# Patient Record
Sex: Male | Born: 1965 | Race: White | Hispanic: No | Smoking: Former smoker
Health system: Southern US, Community
[De-identification: ages and names within clinical notes are randomized; demographics above are authoritative.]

## PROBLEM LIST (undated history)

## (undated) DIAGNOSIS — M1990 Unspecified osteoarthritis, unspecified site: Secondary | ICD-10-CM

## (undated) DIAGNOSIS — C801 Malignant (primary) neoplasm, unspecified: Secondary | ICD-10-CM

## (undated) DIAGNOSIS — D649 Anemia, unspecified: Secondary | ICD-10-CM

## (undated) DIAGNOSIS — E7800 Pure hypercholesterolemia, unspecified: Secondary | ICD-10-CM

## (undated) DIAGNOSIS — C7951 Secondary malignant neoplasm of bone: Secondary | ICD-10-CM

## (undated) DIAGNOSIS — K589 Irritable bowel syndrome without diarrhea: Secondary | ICD-10-CM

## (undated) DIAGNOSIS — R011 Cardiac murmur, unspecified: Secondary | ICD-10-CM

## (undated) DIAGNOSIS — E119 Type 2 diabetes mellitus without complications: Secondary | ICD-10-CM

## (undated) DIAGNOSIS — D689 Coagulation defect, unspecified: Secondary | ICD-10-CM

## (undated) DIAGNOSIS — K219 Gastro-esophageal reflux disease without esophagitis: Secondary | ICD-10-CM

## (undated) HISTORY — PX: FRACTURE SURGERY: SHX138

## (undated) HISTORY — PX: COLONOSCOPY: SHX174

## (undated) HISTORY — DX: Gastro-esophageal reflux disease without esophagitis: K21.9

## (undated) HISTORY — PX: CYSTECTOMY: SUR359

## (undated) HISTORY — DX: Pure hypercholesterolemia, unspecified: E78.00

## (undated) HISTORY — PX: PILONIDAL CYST EXCISION: SHX744

## (undated) HISTORY — DX: Type 2 diabetes mellitus without complications: E11.9

## (undated) HISTORY — DX: Anemia, unspecified: D64.9

## (undated) HISTORY — DX: Cardiac murmur, unspecified: R01.1

## (undated) HISTORY — DX: Coagulation defect, unspecified: D68.9

## (undated) HISTORY — DX: Unspecified osteoarthritis, unspecified site: M19.90

## (undated) NOTE — *Deleted (*Deleted)
Radiation Oncology         [phone] ________________________________  Name: Mike [name] MRN: [id number]  Date: 08/18/2020  DOB: Jan 02, 1966  Follow-Up Visit Note  CC: Eustaquio Boyden, MD  Si Gaul, MD  No diagnosis found.  Diagnosis: Stage IV (T2b, N3, M1C) non-small cell lung cancer, squamous cell carcinoma presented with right upper lobe lung mass in addition to mediastinal and right supraclavicular lymphadenopathy as well as multiple metastatic bone lesions  Interval Since Last Radiation: One month and three days  Radiation Treatment Dates: 01/01/2020 through 01/14/2020 and 07/07/2020 through 07/16/2020 Site Technique Total Dose (Gy) Dose per Fx (Gy) Completed Fx Beam Energies  Hip, Right: Pelvis_Rt 3D 30/30 3 10/10 10X, 15X  Ribs, Right: Chest_Rt 3D 30/30 3 10/10 10X  Chest: Chest 3D 30/30 3 10/10 6X  Lung, Right: Lung_Rt 3D 20/20 2.5 8/8 10X, 15X  Ribs, Right: Chest_Rt 3D 20/20 2.5 8/8 15X    Narrative:  The patient returns today for routine follow-up and to discuss additional treatment. He was last seen by Dr. Roda Shutters on 08/13/2020, during which time he was noted to be progressively better and continued to take Oxycodone and Morphine as needed.  On review of systems, he reports ***. He denies ***.                  ALLERGIES:  has No Known Allergies.  Meds: Current Outpatient Medications  Medication Sig Dispense Refill  . acetaminophen (TYLENOL) 325 MG tablet Take 1-2 tablets (325-650 mg total) by mouth every 6 (six) hours as needed for mild pain (pain score 1-3 or temp > 100.5).    [name] albuterol (PROVENTIL) (2.5 MG/3ML) 0.083% nebulizer solution Take 3 mLs (2.5 mg total) by nebulization every 6 (six) hours as needed for wheezing or shortness of breath. 75 mL 12  . apixaban (ELIQUIS) 5 MG TABS tablet Take 1 tablet (5 mg total) by mouth 2 (two) times daily. 60 tablet 3  . atorvastatin (LIPITOR) 20 MG tablet Take 1 tablet (20 mg total) by mouth at bedtime. 90 tablet  3  . diazepam (VALIUM) 5 MG tablet Take 1 tablet (5 mg total) by mouth every 12 (twelve) hours as needed for anxiety or muscle spasms. 30 tablet 0  . docusate sodium (COLACE) 100 MG capsule Take 1 capsule (100 mg total) by mouth 2 (two) times daily. 10 capsule 0  . metFORMIN (GLUCOPHAGE) 1000 MG tablet Take 1 tablet (1,000 mg total) by mouth 2 (two) times daily with a meal. 180 tablet 3  . methocarbamol (ROBAXIN) 500 MG tablet Take 1 tablet (500 mg total) by mouth every 6 (six) hours as needed for muscle spasms. 60 tablet 6  . morphine (MS CONTIN) 60 MG 12 hr tablet Take 1 tablet (60 mg total) by mouth every 12 (twelve) hours. 30 tablet 0  . Oxycodone HCl 10 MG TABS Take 1 tablet (10 mg total) by mouth every 3 (three) hours as needed (for severe breakthrough pain). 60 tablet 0  . oxyCODONE-acetaminophen (PERCOCET) 5-325 MG tablet Take 1-2 tablets by mouth 3 (three) times daily as needed. 30 tablet 0  . sildenafil (REVATIO) 20 MG tablet Take 1-5 tablets (20-100 mg total) by mouth daily as needed (ED). (Patient taking differently: Take 40 mg by mouth daily as needed (ED). ) 30 tablet 3   No current facility-administered medications for this encounter.    Physical Findings: The patient is in no acute distress. Patient is alert and oriented.  vitals were not  taken for this visit.  . Lungs are clear to auscultation bilaterally. Heart has regular rate and rhythm. No palpable cervical, supraclavicular, or axillary adenopathy. Abdomen soft, non-tender, normal bowel sounds. ***  Lab Findings: Lab Results  Component Value Date   WBC 8.9 07/27/2020   HGB 12.3 (L) 07/27/2020   HCT 37.5 (L) 07/27/2020   MCV 92.6 07/27/2020   PLT 248 07/27/2020    Radiographic Findings: MR Pelvis W Wo Contrast  Result Date: 07/20/2020 CLINICAL DATA:  Posterior left hip pain in a patient with a history of metastatic lung cancer. EXAM: MRI PELVIS WITHOUT AND WITH CONTRAST TECHNIQUE: Multiplanar multisequence MR  imaging of the pelvis was performed both before and after administration of intravenous contrast. CONTRAST:  10 mL GADAVIST IV SOLN COMPARISON:  CT chest, abdomen and pelvis 06/06/2020. FINDINGS: Bones/Joint/Cartilage A metastatic deposit in the base of the left femoral neck and left intertrochanteric femur measures approximately 4.5 cm AP by 5 cm transverse by 6 cm craniocaudal. The deposit fills the base of the femoral neck. There appears to be a small focus of cortical destruction along the anterior, superior margin of the femoral neck. A second metastatic deposit in the right side of the S1 vertebral body is approximately 3.1 cm transverse by 2 cm craniocaudal by 2 cm AP. There is also a metastatic lesion in the posterior left ilium measuring 4 cm AP by 2 cm transverse by 3.8 cm craniocaudal. Previously seen metastatic lesion in the posterior right acetabulum is now cystic with a corticated margin consistent with prior treatment. There is enhancing soft tissue about the periphery of the lesion worrisome for residual tumor. Finally, a punctate metastatic deposit is seen in the left acetabulum image 22 of series 12. Ligaments Intact. Muscles and Tendons Intact. Edema in the left hip adductors is likely due to strain from altered mechanics. No intramuscular mass or fluid collection. Soft tissues No acute or focal abnormality. IMPRESSION: Osseous metastatic disease as described above. Largest deposit is in the base of the left femoral neck and intertrochanteric femur. There appears to be a small cortical break in the superior margin of the femoral neck and the patient may be at risk for pathologic fracture. These results will be called to the ordering clinician or representative by the Radiologist Assistant, and communication documented in the PACS or [employer]. Electronically Signed   By: Drusilla Kanner M.D.   On: 07/20/2020 12:18   DG C-Arm 1-60 Min  Result Date: 07/25/2020 CLINICAL DATA:  Known  proximal left femoral metastatic disease EXAM: OPERATIVE LEFT HIP WITH PELVIS; DG C-ARM 1-60 MIN COMPARISON:  MRI from 07/19/2020 FLUOROSCOPY TIME:  Fluoroscopy Time:  1 minutes 20 seconds Radiation Exposure Index (if provided by the fluoroscopic device): 30.15 mGy Number of Acquired Spot Images: 4 FINDINGS: Medullary rod is seen in the left femur with proximal and distal fixation screws. The known proximal femoral metastatic lesion is not well appreciated on the spot films. IMPRESSION: Fixation of proximal left femoral metastatic disease. Electronically Signed   By: Alcide Clever M.D.   On: 07/25/2020 18:47   DG HIP OPERATIVE UNILAT W OR W/O PELVIS LEFT  Result Date: 07/25/2020 CLINICAL DATA:  Known proximal left femoral metastatic disease EXAM: OPERATIVE LEFT HIP WITH PELVIS; DG C-ARM 1-60 MIN COMPARISON:  MRI from 07/19/2020 FLUOROSCOPY TIME:  Fluoroscopy Time:  1 minutes 20 seconds Radiation Exposure Index (if provided by the fluoroscopic device): 30.15 mGy Number of Acquired Spot Images: 4 FINDINGS: Medullary rod is seen  in the left femur with proximal and distal fixation screws. The known proximal femoral metastatic lesion is not well appreciated on the spot films. IMPRESSION: Fixation of proximal left femoral metastatic disease. Electronically Signed   By: Alcide Clever M.D.   On: 07/25/2020 18:47   XR HIP UNILAT W OR W/O PELVIS 2-3 VIEWS LEFT  Result Date: 08/13/2020 No hardware complications.     Impression: Stage IV (T2b, N3, M1C) non-small cell lung cancer, squamous cell carcinoma presented with right upper lobe lung mass in addition to mediastinal and right supraclavicular lymphadenopathy as well as multiple metastatic bone lesions  ***  Plan: The patient will return on *** for CT simulation. Anticipate 10 treatments to the left femur region and 10 treatments to the disease in the left pelvis region.  -----------------------------------   Billie Lade, PhD, MD  This document  serves as a record of services personally performed by Antony Blackbird, MD. It was created on his behalf by Nikki Dom, a trained medical scribe. The creation of this record is based on the scribe's personal observations and the provider's statements to them. This document has been checked and approved by the attending provider.

---

## 2014-04-17 DIAGNOSIS — E1169 Type 2 diabetes mellitus with other specified complication: Secondary | ICD-10-CM | POA: Insufficient documentation

## 2014-07-24 ENCOUNTER — Ambulatory Visit (HOSPITAL_BASED_OUTPATIENT_CLINIC_OR_DEPARTMENT_OTHER): Payer: BC Managed Care – PPO | Attending: Internal Medicine | Admitting: Radiology

## 2014-07-24 VITALS — Ht 73.0 in | Wt 275.0 lb

## 2014-07-24 DIAGNOSIS — G4730 Sleep apnea, unspecified: Secondary | ICD-10-CM | POA: Diagnosis present

## 2014-07-24 DIAGNOSIS — R0683 Snoring: Secondary | ICD-10-CM | POA: Diagnosis not present

## 2014-07-24 DIAGNOSIS — G4733 Obstructive sleep apnea (adult) (pediatric): Secondary | ICD-10-CM | POA: Diagnosis not present

## 2014-08-03 DIAGNOSIS — G4733 Obstructive sleep apnea (adult) (pediatric): Secondary | ICD-10-CM

## 2014-08-03 NOTE — Sleep Study (Signed)
   NAME: Mike [name] DATE OF BIRTH:  03/05/1966 MEDICAL RECORD NUMBER [id number]  LOCATION: [facility]  PHYSICIAN: Jalin Alicea D  DATE OF STUDY: 07/24/2014  SLEEP STUDY TYPE: Nocturnal Polysomnogram               REFERRING PHYSICIAN: Leonard Downing, *  INDICATION FOR STUDY: Hypersomnia with sleep apnea  EPWORTH SLEEPINESS SCORE:   11/24 HEIGHT: 6\' 1"  (185.4 cm)  WEIGHT: 275 lb (124.739 kg)    Body mass index is 36.29 kg/(m^2).  NECK SIZE: 18 in.  MEDICATIONS: Chart for review  SLEEP ARCHITECTURE: Total sleep time 398 minutes with sleep efficiency 92%. Stage I was 13.7%, stage II 72.1%, stage III absent, REM 14.2% of total sleep time. Sleep latency 5.5 minutes, REM latency 130 minutes, awake after sleep onset 29 minutes, arousal index 58.2. Bedtime medication: None  RESPIRATORY DATA: Apnea hypopneas index (AHI) 118.6 per hour. 784 total events scored including 264 obstructive apneas, 2 central apneas, 16 mixed apneas, 500 to hypopneas. Events were not positional. REM AHI 129.6 per hour. This study was ordered as a diagnostic polysomnogram (NPSG), so CPAP titration was not done.  OXYGEN DATA: Moderate to loud snoring with oxygen desaturation to a nadir of 72% and mean saturation 90.8% on room air.  CARDIAC DATA: Sinus rhythm with PVCs  MOVEMENT/PARASOMNIA: No significant motor disturbance, bathroom x2  IMPRESSION/ RECOMMENDATION:   1) Very severe obstructive sleep apnea/hypopneas syndrome, AHI 118.6 per hour. Non-positional events. REM AHI 129.6 per hour. Moderate to loud snoring with oxygen desaturation to a nadir of 72% and mean saturation 90.8% on room air. 2) This study was ordered as a diagnostic polysomnogram (NPSG) without CPAP titration option. This patient can return for a dedicated CPAP titration study.   Deneise Lever Diplomate, American Board of Sleep Medicine  ELECTRONICALLY SIGNED ON:  08/03/2014, 9:32 AM [place] PH: [phone]   FX: [phone] [place]

## 2014-10-01 ENCOUNTER — Ambulatory Visit: Payer: BC Managed Care – PPO | Admitting: Internal Medicine

## 2014-10-18 DIAGNOSIS — N529 Male erectile dysfunction, unspecified: Secondary | ICD-10-CM | POA: Insufficient documentation

## 2019-08-06 ENCOUNTER — Telehealth: Payer: Self-pay | Admitting: Family Medicine

## 2019-08-06 NOTE — Telephone Encounter (Signed)
Does not look like Jaylenn is even a pt here.

## 2019-08-06 NOTE — Telephone Encounter (Signed)
Patient called and said his fiance, Mamie Nick, is a patient of Dr.G.  Patient is requesting to see Dr.G as a new patient.  Patient's aware Dr. Darnell Level isn't seeing new patients, but he wanted [place] to ask.

## 2019-08-06 NOTE — Telephone Encounter (Signed)
Ok to place in open 30-min slot Thanks.

## 2019-08-08 NOTE — Telephone Encounter (Signed)
Pt is scheduled for 09/04/19.

## 2019-09-04 ENCOUNTER — Other Ambulatory Visit: Payer: Self-pay

## 2019-09-04 ENCOUNTER — Ambulatory Visit (INDEPENDENT_AMBULATORY_CARE_PROVIDER_SITE_OTHER): Payer: 59 | Admitting: Family Medicine

## 2019-09-04 ENCOUNTER — Encounter: Payer: Self-pay | Admitting: Family Medicine

## 2019-09-04 VITALS — BP 136/86 | HR 94 | Temp 98.4°F | Ht 71.75 in | Wt 278.3 lb

## 2019-09-04 DIAGNOSIS — R1013 Epigastric pain: Secondary | ICD-10-CM

## 2019-09-04 DIAGNOSIS — R109 Unspecified abdominal pain: Secondary | ICD-10-CM | POA: Insufficient documentation

## 2019-09-04 DIAGNOSIS — Z1211 Encounter for screening for malignant neoplasm of colon: Secondary | ICD-10-CM

## 2019-09-04 DIAGNOSIS — E1169 Type 2 diabetes mellitus with other specified complication: Secondary | ICD-10-CM

## 2019-09-04 DIAGNOSIS — E669 Obesity, unspecified: Secondary | ICD-10-CM | POA: Insufficient documentation

## 2019-09-04 DIAGNOSIS — Z23 Encounter for immunization: Secondary | ICD-10-CM | POA: Diagnosis not present

## 2019-09-04 HISTORY — DX: Morbid (severe) obesity due to excess calories: E66.01

## 2019-09-04 LAB — COMPREHENSIVE METABOLIC PANEL
ALT: 34 U/L (ref 0–53)
AST: 21 U/L (ref 0–37)
Albumin: 4.1 g/dL (ref 3.5–5.2)
Alkaline Phosphatase: 69 U/L (ref 39–117)
BUN: 10 mg/dL (ref 6–23)
CO2: 30 mEq/L (ref 19–32)
Calcium: 9.3 mg/dL (ref 8.4–10.5)
Chloride: 99 mEq/L (ref 96–112)
Creatinine, Ser: 0.88 mg/dL (ref 0.40–1.50)
GFR: 89.65 mL/min (ref >60.00)
Glucose, Bld: 215 mg/dL — ABNORMAL HIGH (ref 70–99)
Potassium: 5.1 mEq/L (ref 3.5–5.1)
Sodium: 135 mEq/L (ref 135–145)
Total Bilirubin: 0.3 mg/dL (ref 0.2–1.2)
Total Protein: 6.8 g/dL (ref 6.0–8.3)

## 2019-09-04 LAB — CBC WITH DIFFERENTIAL/PLATELET
Basophils Absolute: 0.1 10*3/uL (ref 0.0–0.1)
Basophils Relative: 0.6 % (ref 0.0–3.0)
Eosinophils Absolute: 0.1 10*3/uL (ref 0.0–0.7)
Eosinophils Relative: 1.4 % (ref 0.0–5.0)
HCT: 43.6 % (ref 39.0–52.0)
Hemoglobin: 14.2 g/dL (ref 13.0–17.0)
Lymphocytes Relative: 24.1 % (ref 12.0–46.0)
Lymphs Abs: 2.6 10*3/uL (ref 0.7–4.0)
MCHC: 32.7 g/dL (ref 30.0–36.0)
MCV: 94.6 fl (ref 78.0–100.0)
Monocytes Absolute: 0.9 10*3/uL (ref 0.1–1.0)
Monocytes Relative: 8.1 % (ref 3.0–12.0)
Neutro Abs: 7.2 10*3/uL (ref 1.4–7.7)
Neutrophils Relative %: 65.8 % (ref 43.0–77.0)
Platelets: 249 10*3/uL (ref 150.0–400.0)
RBC: 4.61 Mil/uL (ref 4.22–5.81)
RDW: 13.5 % (ref 11.5–15.5)
WBC: 10.9 10*3/uL — ABNORMAL HIGH (ref 4.0–10.5)

## 2019-09-04 LAB — LIPID PANEL
Cholesterol: 202 mg/dL — ABNORMAL HIGH (ref 0–200)
HDL: 36.3 mg/dL — ABNORMAL LOW (ref >39.00)
LDL Cholesterol: 131 mg/dL — ABNORMAL HIGH (ref 0–99)
NonHDL: 165.5
Total CHOL/HDL Ratio: 6
Triglycerides: 175 mg/dL — ABNORMAL HIGH (ref 0.0–149.0)
VLDL: 35 mg/dL (ref 0.0–40.0)

## 2019-09-04 LAB — POC URINALSYSI DIPSTICK (AUTOMATED)
Bilirubin, UA: NEGATIVE
Blood, UA: NEGATIVE
Glucose, UA: POSITIVE — AB
Ketones, UA: NEGATIVE
Leukocytes, UA: NEGATIVE
Nitrite, UA: NEGATIVE
Protein, UA: NEGATIVE
Spec Grav, UA: >=1.03 — AB (ref 1.010–1.025)
Urobilinogen, UA: 0.2 E.U./dL
pH, UA: 5.5 (ref 5.0–8.0)

## 2019-09-04 LAB — LIPASE: Lipase: 32 U/L (ref 11.0–59.0)

## 2019-09-04 LAB — HEMOGLOBIN A1C: Hgb A1c MFr Bld: 8.4 % — ABNORMAL HIGH (ref 4.6–6.5)

## 2019-09-04 MED ORDER — DIAZEPAM 5 MG PO TABS: 5.0000 mg | ORAL_TABLET | Freq: Every day | ORAL | 0 refills | Status: DC | PRN

## 2019-09-04 MED ORDER — OMEPRAZOLE 40 MG PO CPDR
40.0000 mg | DELAYED_RELEASE_CAPSULE | Freq: Every day | ORAL | 3 refills | Status: DC
Start: 2019-09-04 — End: 2020-07-24

## 2019-09-04 NOTE — Assessment & Plan Note (Signed)
Intermittent but progressively worsening, trigger may be caffeine intake. No red flags today. Suspect gastritis. Treat with omeprazole 40mg  daily x 1 month then PRN. Discussed dietary choices to control symptoms. Reassess at CPE>

## 2019-09-04 NOTE — Assessment & Plan Note (Signed)
Bilateral - check kidney function today as well as urinalysis.

## 2019-09-04 NOTE — Patient Instructions (Addendum)
First shingrix today.  Labs today.  Try to cut down on smoking and caffeine for GI symptoms.  We will refer you for colonoscopy.  Start omeprazole 40mg  daily for 1 month then as needed.  Return in 2-3 months for physical.

## 2019-09-04 NOTE — Assessment & Plan Note (Signed)
Encouraged renewed efforts at healthy diet and regular walking routine for goal sustainable weight loss.

## 2019-09-04 NOTE — Progress Notes (Signed)
This visit was conducted in person.  BP 136/86 (BP Location: Left Arm, Patient Position: Sitting, Cuff Size: Large)   Pulse 94   Temp 98.4 F (36.9 C) (Temporal)   Ht 5' 11.75" (1.822 m)   Wt 278 lb 5 oz (126.2 kg)   SpO2 95%   BMI 38.01 kg/m    CC: new pt to establish Subjective:    Patient ID: Mike [name], male    DOB: Sep 11, 1966, 53 y.o.   MRN: [id number]  HPI: Mike [name] is a 53 y.o. male presenting on 09/04/2019 for New Patient (Initial Visit) (Requests Shingrix vaccine.)   I see patient's fiancee Mike [name] Prior saw [place]. Last CPE was >1 yr ago.   Prior on metformin, lipitor, diazepam (for neck muscle spasms - last took 6 mo ago - takes a few a month) and sildenafil 20mg  PRN.   Intermittent severe abd cramping relieved with burping, progressively worsening. This lasts minutes. No fever, nausea/vomiting, diarrhea/constipation, bowel changes, blood in stool, trouble swallowing, early satiety or unexpected weight changes. No boring pain to the back. Frequent heartburn managed with baking soda.   Ongoing bilateral flank pain over the past month "feels like pulled sore muscles" worse with bending over and sitting up.   Caffeine - 40 oz/day Current smoker - 1 ppd. Has tried patches, gum, chantix, wellbutrin.  Rare alcohol.   DM - stopped metformin and lipitor several months ago. Went on low carb diet and lost 60 lbs, but has since regained weight. Dx 2017. Does not regularly check sugars. Denies low sugars or hypoglycemic symptoms. Denies paresthesias. Last diabetic eye exam >1 yr ago. Pneumovax: DUE. Prevnar: not due. Glucometer brand: unsure. DSME: has not had done yet. No results found for: HGBA1C Diabetic Foot Exam - Simple   No data filed     No results found for: MICROALBUR, [id number]   Only had coffee with cream and sugar this morning.   Preventative: Overdue for CPE Colon cancer screening - requests colonoscopy Prostate  cancer screen -  Lung cancer screening - eligible 40+ PY hx - will review at CPE  Lives with brother Mike [name]) and fiancee Mike [name]) Occ: Civil engineer, contracting  Edu: HS, self taught [employer] Activity:  Diet:      Relevant past medical, surgical, family and social history reviewed and updated as indicated. Interim medical history since our last visit reviewed. Allergies and medications reviewed and updated. Outpatient Medications Prior to Visit  Medication Sig Dispense Refill  . atorvastatin (LIPITOR) 10 MG tablet Take 1 tablet by mouth daily.    . sildenafil (REVATIO) 20 MG tablet Take by mouth. Take 1-5 tablets by mouth 1 hour before need    . diazepam (VALIUM) 5 MG tablet     . metFORMIN (GLUCOPHAGE) 1000 MG tablet Take 1,000 mg by mouth 2 (two) times daily with a meal.     No facility-administered medications prior to visit.      Per HPI unless specifically indicated in ROS section below Review of Systems Objective:    BP 136/86 (BP Location: Left Arm, Patient Position: Sitting, Cuff Size: Large)   Pulse 94   Temp 98.4 F (36.9 C) (Temporal)   Ht 5' 11.75" (1.822 m)   Wt 278 lb 5 oz (126.2 kg)   SpO2 95%   BMI 38.01 kg/m   Wt Readings from Last 3 Encounters:  09/04/19 278 lb 5 oz (126.2 kg)  07/24/14 275 lb (124.7 kg)  Physical Exam Vitals signs and nursing note reviewed.  Constitutional:      General: He is not in acute distress.    Appearance: He is well-developed.  HENT:     Head: Normocephalic and atraumatic.     Right Ear: Hearing normal.     Left Ear: Hearing normal.     Mouth/Throat:     Mouth: Mucous membranes are moist.     Pharynx: Oropharynx is clear. Uvula midline. No posterior oropharyngeal erythema.  Eyes:     General: No scleral icterus.    Extraocular Movements: Extraocular movements intact.     Conjunctiva/sclera: Conjunctivae normal.     Pupils: Pupils are equal, round, and reactive to light.  Neck:     Musculoskeletal: Normal  range of motion and neck supple.  Cardiovascular:     Rate and Rhythm: Normal rate and regular rhythm.     Pulses: Normal pulses.          Radial pulses are 2+ on the right side and 2+ on the left side.     Heart sounds: Normal heart sounds. No murmur.  Pulmonary:     Effort: Pulmonary effort is normal. No respiratory distress.     Breath sounds: Normal breath sounds. No wheezing, rhonchi or rales.  Abdominal:     General: Bowel sounds are normal. There is no distension.     Palpations: Abdomen is soft. There is no mass.     Tenderness: There is abdominal tenderness (mlid-mod) in the epigastric area. There is no right CVA tenderness, left CVA tenderness, guarding or rebound.     Hernia: No hernia is present.  Musculoskeletal: Normal range of motion.     Right lower leg: No edema.     Left lower leg: No edema.  Lymphadenopathy:     Cervical: No cervical adenopathy.  Skin:    General: Skin is warm and dry.     Findings: No rash.  Neurological:     General: No focal deficit present.     Mental Status: He is alert and oriented to person, place, and time.     Comments: CN grossly intact, station and gait intact  Psychiatric:        Mood and Affect: Mood normal.        Behavior: Behavior normal.        Thought Content: Thought content normal.        Judgment: Judgment normal.       Results for orders placed or performed in visit on 09/04/19  POCT Urinalysis Dipstick (Automated)  Result Value Ref Range   Color, UA dark yellow    Clarity, UA clear    Glucose, UA Positive (A) Negative   Bilirubin, UA negative    Ketones, UA negative    Spec Grav, UA >=1.030 (A) 1.010 - 1.025   Blood, UA negative    pH, UA 5.5 5.0 - 8.0   Protein, UA Negative Negative   Urobilinogen, UA 0.2 0.2 or 1.0 E.U./dL   Nitrite, UA negative    Leukocytes, UA Negative Negative   Assessment & Plan:  Declines flu shot Problem List Items Addressed This Visit    Type 2 diabetes mellitus with other  specified complication ([place]) - Primary    Update labs today. Off metformin most of this year. Encouraged renewed efforts at low sugar low carb diabetic diet.       Relevant Medications   atorvastatin (LIPITOR) 10 MG tablet   metFORMIN (GLUCOPHAGE) 1000 MG  tablet   Other Relevant Orders   Lipid panel   Comprehensive metabolic panel   Hemoglobin A1c   Severe obesity (BMI 35.0-39.9) with comorbidity ([place])    Encouraged renewed efforts at healthy diet and regular walking routine for goal sustainable weight loss.       Relevant Medications   metFORMIN (GLUCOPHAGE) 1000 MG tablet   Flank pain    Bilateral - check kidney function today as well as urinalysis.       Relevant Orders   POCT Urinalysis Dipstick (Automated) (Completed)   Epigastric abdominal pain    Intermittent but progressively worsening, trigger may be caffeine intake. No red flags today. Suspect gastritis. Treat with omeprazole 40mg  daily x 1 month then PRN. Discussed dietary choices to control symptoms. Reassess at CPE>       Relevant Orders   CBC with Differential   Lipase    Other Visit Diagnoses    Special screening for malignant neoplasms, colon       Relevant Orders   Ambulatory referral to Gastroenterology   Need for shingles vaccine       Relevant Orders   Varicella-zoster vaccine IM (Completed)       Meds ordered this encounter  Medications  . diazepam (VALIUM) 5 MG tablet    Sig: Take 1 tablet (5 mg total) by mouth daily as needed for muscle spasms.    Dispense:  30 tablet    Refill:  0  . omeprazole (PRILOSEC) 40 MG capsule    Sig: Take 1 capsule (40 mg total) by mouth daily. For 3 weeks then as needed    Dispense:  30 capsule    Refill:  3   Orders Placed This Encounter  Procedures  . Varicella-zoster vaccine IM  . Lipid panel  . Comprehensive metabolic panel  . Hemoglobin A1c  . CBC with Differential  . Lipase  . Ambulatory referral to Gastroenterology    Referral Priority:   Routine     Referral Type:   Consultation    Referral Reason:   Specialty Services Required    Number of Visits Requested:   1  . POCT Urinalysis Dipstick (Automated)    Patient Instructions  First shingrix today.  Labs today.  Try to cut down on smoking and caffeine for GI symptoms.  We will refer you for colonoscopy.  Start omeprazole 40mg  daily for 1 month then as needed.  Return in 2-3 months for physical.    Follow up plan: Return in about 2 months (around 11/04/2019) for annual exam, prior fasting for blood work.  [name], MD

## 2019-09-04 NOTE — Assessment & Plan Note (Signed)
Update labs today. Off metformin most of this year. Encouraged renewed efforts at low sugar low carb diabetic diet.

## 2019-09-05 ENCOUNTER — Other Ambulatory Visit: Payer: Self-pay | Admitting: Family Medicine

## 2019-09-05 MED ORDER — ATORVASTATIN CALCIUM 20 MG PO TABS: 10.0000 mg | ORAL_TABLET | Freq: Every day | ORAL | 3 refills | Status: DC

## 2019-09-05 MED ORDER — METFORMIN HCL 1000 MG PO TABS: 500.0000 mg | ORAL_TABLET | Freq: Two times a day (BID) | ORAL | 3 refills | Status: DC

## 2019-09-15 ENCOUNTER — Encounter: Payer: Self-pay | Admitting: Family Medicine

## 2019-09-15 DIAGNOSIS — F17200 Nicotine dependence, unspecified, uncomplicated: Secondary | ICD-10-CM | POA: Insufficient documentation

## 2019-09-15 DIAGNOSIS — G4733 Obstructive sleep apnea (adult) (pediatric): Secondary | ICD-10-CM

## 2019-09-15 DIAGNOSIS — Z87891 Personal history of nicotine dependence: Secondary | ICD-10-CM | POA: Insufficient documentation

## 2019-09-15 HISTORY — DX: Obstructive sleep apnea (adult) (pediatric): G47.33

## 2019-09-18 ENCOUNTER — Encounter: Payer: Self-pay | Admitting: Gastroenterology

## 2019-10-08 ENCOUNTER — Other Ambulatory Visit: Payer: Self-pay

## 2019-10-08 ENCOUNTER — Ambulatory Visit (AMBULATORY_SURGERY_CENTER): Payer: 59 | Admitting: *Deleted

## 2019-10-08 VITALS — Temp 97.6°F | Ht 71.75 in | Wt 280.0 lb

## 2019-10-08 DIAGNOSIS — Z1211 Encounter for screening for malignant neoplasm of colon: Secondary | ICD-10-CM

## 2019-10-08 DIAGNOSIS — Z1159 Encounter for screening for other viral diseases: Secondary | ICD-10-CM

## 2019-10-08 MED ORDER — NA SULFATE-K SULFATE-MG SULF 17.5-3.13-1.6 GM/177ML PO SOLN: ORAL | 0 refills | Status: DC

## 2019-10-08 NOTE — Progress Notes (Signed)
Pt is aware that care partner will wait in the car during procedure; if they feel like they will be too hot or cold to wait in the car; they may wait in the 4 th floor lobby. Patient is aware to bring only one care partner. We want them to wear a mask (we do not have any that we can provide them), practice social distancing, and we will check their temperatures when they get here.  I did remind the patient that their care partner needs to stay in the parking lot the entire time and have a cell phone available, we will call them when the pt is ready for discharge. Patient will wear mask into building.  No trouble moving neck per pt  Pt's mother recently passed aware from covid 7.  He had no symptoms but quarantined for two weeks.  He has no symptoms at this time either  No egg or soy allergy  No home oxygen use or problems with anesthesia  No medications for weight loss taken  emmi information given $15 off Suprep coupon given

## 2019-10-19 HISTORY — PX: COLONOSCOPY: SHX174

## 2019-10-22 ENCOUNTER — Encounter: Payer: Self-pay | Admitting: Gastroenterology

## 2019-10-23 ENCOUNTER — Other Ambulatory Visit: Payer: Self-pay | Admitting: Gastroenterology

## 2019-10-23 ENCOUNTER — Ambulatory Visit (INDEPENDENT_AMBULATORY_CARE_PROVIDER_SITE_OTHER): Payer: 59

## 2019-10-23 DIAGNOSIS — Z1159 Encounter for screening for other viral diseases: Secondary | ICD-10-CM

## 2019-10-24 LAB — SARS CORONAVIRUS 2 (TAT 6-24 HRS): SARS Coronavirus 2: NEGATIVE

## 2019-10-26 ENCOUNTER — Ambulatory Visit (AMBULATORY_SURGERY_CENTER): Payer: 59 | Admitting: Gastroenterology

## 2019-10-26 ENCOUNTER — Other Ambulatory Visit: Payer: Self-pay | Admitting: Gastroenterology

## 2019-10-26 ENCOUNTER — Encounter: Payer: Self-pay | Admitting: Gastroenterology

## 2019-10-26 ENCOUNTER — Other Ambulatory Visit: Payer: Self-pay

## 2019-10-26 VITALS — BP 135/92 | HR 69 | Temp 98.2°F | Resp 22 | Ht 71.0 in | Wt 280.0 lb

## 2019-10-26 DIAGNOSIS — D125 Benign neoplasm of sigmoid colon: Secondary | ICD-10-CM | POA: Diagnosis not present

## 2019-10-26 DIAGNOSIS — Z1211 Encounter for screening for malignant neoplasm of colon: Secondary | ICD-10-CM

## 2019-10-26 DIAGNOSIS — D122 Benign neoplasm of ascending colon: Secondary | ICD-10-CM

## 2019-10-26 DIAGNOSIS — D123 Benign neoplasm of transverse colon: Secondary | ICD-10-CM

## 2019-10-26 DIAGNOSIS — D120 Benign neoplasm of cecum: Secondary | ICD-10-CM

## 2019-10-26 MED ORDER — SODIUM CHLORIDE 0.9 % IV SOLN: 500.0000 mL | Freq: Once | INTRAVENOUS | Status: DC

## 2019-10-26 NOTE — Progress Notes (Signed)
Called to room to assist during endoscopic procedure.  Patient ID and intended procedure confirmed with present staff. Received instructions for my participation in the procedure from the performing physician.  

## 2019-10-26 NOTE — Progress Notes (Signed)
Temp JR  VS  CW   Pt's states no medical or surgical changes since previsit or office visit.

## 2019-10-26 NOTE — Op Note (Signed)
[facility] Patient Name: Mike [name] Procedure Date: 10/26/2019 8:22 AM MRN: [id number] Endoscopist: Remo Lipps P. Havery Moros , MD Age: 54 Referring MD:  Date of Birth: April 14, 1966 Gender: Male Account #: [id number] Procedure:                Colonoscopy Indications:              Screening for colorectal malignant neoplasm, This                            is the patient's first colonoscopy Medicines:                Monitored Anesthesia Care Procedure:                Pre-Anesthesia Assessment:                           - Prior to the procedure, a History and Physical                            was performed, and patient medications and                            allergies were reviewed. The patient's tolerance of                            previous anesthesia was also reviewed. The risks                            and benefits of the procedure and the sedation                            options and risks were discussed with the patient.                            All questions were answered, and informed consent                            was obtained. Prior Anticoagulants: The patient has                            taken no previous anticoagulant or antiplatelet                            agents. ASA Grade Assessment: II - A patient with                            mild systemic disease. After reviewing the risks                            and benefits, the patient was deemed in                            satisfactory condition to undergo the procedure.  After obtaining informed consent, the colonoscope                            was passed under direct vision. Throughout the                            procedure, the patient's blood pressure, pulse, and                            oxygen saturations were monitored continuously. The                            Colonoscope was introduced through the anus and                            advanced to the the  cecum, identified by                            appendiceal orifice and ileocecal valve. The                            colonoscopy was performed without difficulty. The                            patient tolerated the procedure well. The quality                            of the bowel preparation was adequate. The                            ileocecal valve, appendiceal orifice, and rectum                            were photographed. Scope In: 8:36:03 AM Scope Out: 9:05:48 AM Scope Withdrawal Time: 0 hours 25 minutes 42 seconds  Total Procedure Duration: 0 hours 29 minutes 45 seconds  Findings:                 The perianal and digital rectal examinations were                            normal.                           Six sessile polyps were found in the cecum. The                            polyps were 2 to 4 mm in size. These polyps were                            removed with a cold snare. Resection and retrieval                            were complete.  A 4 to 5 mm polyp was found in the ileocecal valve.                            The polyp was sessile. The polyp was removed with a                            cold snare. Resection and retrieval were complete.                           Three sessile polyps were found in the ascending                            colon. The polyps were 3 to 5 mm in size. These                            polyps were removed with a cold snare. Resection                            and retrieval were complete.                           A 3 mm polyp was found in the transverse colon. The                            polyp was flat. The polyp was removed with a cold                            snare. Resection and retrieval were complete.                           A 3 mm polyp was found in the sigmoid colon. The                            polyp was sessile. The polyp was removed with a                            cold snare. Resection  and retrieval were complete.                           Internal hemorrhoids were found during retroflexion.                           The exam was otherwise without abnormality. Complications:            No immediate complications. Estimated blood loss:                            Minimal. Estimated Blood Loss:     Estimated blood loss was minimal. Impression:               - Six 2 to 4 mm polyps in the cecum, removed with a  cold snare. Resected and retrieved.                           - One 4 to 5 mm polyp at the ileocecal valve,                            removed with a cold snare. Resected and retrieved.                           - Three 3 to 5 mm polyps in the ascending colon,                            removed with a cold snare. Resected and retrieved.                           - One 3 mm polyp in the transverse colon, removed                            with a cold snare. Resected and retrieved.                           - One 3 mm polyp in the sigmoid colon, removed with                            a cold snare. Resected and retrieved.                           - Internal hemorrhoids.                           - The examination was otherwise normal. Recommendation:           - Patient has a contact number available for                            emergencies. The signs and symptoms of potential                            delayed complications were discussed with the                            patient. Return to normal activities tomorrow.                            Written discharge instructions were provided to the                            patient.                           - Resume previous diet.                           - Continue present medications.                           -  Await pathology results. Remo Lipps P. Chance Munter, MD 10/26/2019 9:14:01 AM This report has been signed electronically.

## 2019-10-26 NOTE — Patient Instructions (Signed)
Handouts given for polyps and hemorrhoids.  YOU HAD AN ENDOSCOPIC PROCEDURE TODAY AT [facility]:   Refer to the procedure report that was given to you for any specific questions about what was found during the examination.  If the procedure report does not answer your questions, please call your gastroenterologist to clarify.  If you requested that your care partner not be given the details of your procedure findings, then the procedure report has been included in a sealed envelope for you to review at your convenience later.  YOU SHOULD EXPECT: Some feelings of bloating in the abdomen. Passage of more gas than usual.  Walking can help get rid of the air that was put into your GI tract during the procedure and reduce the bloating. If you had a lower endoscopy (such as a colonoscopy or flexible sigmoidoscopy) you may notice spotting of blood in your stool or on the toilet paper. If you underwent a bowel prep for your procedure, you may not have a normal bowel movement for a few days.  Please Note:  You might notice some irritation and congestion in your nose or some drainage.  This is from the oxygen used during your procedure.  There is no need for concern and it should clear up in a day or so.  SYMPTOMS TO REPORT IMMEDIATELY:   Following lower endoscopy (colonoscopy or flexible sigmoidoscopy):  Excessive amounts of blood in the stool  Significant tenderness or worsening of abdominal pains  Swelling of the abdomen that is new, acute  Fever of 100F or higher  For urgent or emergent issues, a gastroenterologist can be reached at any hour by calling [phone].   DIET:  We do recommend a small meal at first, but then you may proceed to your regular diet.  Drink plenty of fluids but you should avoid alcoholic beverages for 24 hours.  ACTIVITY:  You should plan to take it easy for the rest of today and you should NOT DRIVE or use heavy machinery until tomorrow (because of the  sedation medicines used during the test).    FOLLOW UP: Our staff will call the number listed on your records 48-72 hours following your procedure to check on you and address any questions or concerns that you may have regarding the information given to you following your procedure. If we do not reach you, we will leave a message.  We will attempt to reach you two times.  During this call, we will ask if you have developed any symptoms of COVID 19. If you develop any symptoms (ie: fever, flu-like symptoms, shortness of breath, cough etc.) before then, please call [phone].  If you test positive for Covid 19 in the 2 weeks post procedure, please call and report this information to [place].    If any biopsies were taken you will be contacted by phone or by letter within the next 1-3 weeks.  Please call us at [phone] if you have not heard about the biopsies in 3 weeks.    SIGNATURES/CONFIDENTIALITY: You and/or your care partner have signed paperwork which will be entered into your electronic medical record.  These signatures attest to the fact that that the information above on your After Visit Summary has been reviewed and is understood.  Full responsibility of the confidentiality of this discharge information lies with you and/or your care-partner.

## 2019-10-26 NOTE — Progress Notes (Signed)
A/ox3, pleased with MAC, report to RN 

## 2019-10-30 ENCOUNTER — Telehealth: Payer: Self-pay

## 2019-10-30 NOTE — Telephone Encounter (Signed)
Opened telephone note in error

## 2019-10-30 NOTE — Telephone Encounter (Signed)
No answer, left message to call back later today, B.Kirk Sampley RN. 

## 2019-11-15 ENCOUNTER — Telehealth: Payer: Self-pay

## 2019-11-15 NOTE — Telephone Encounter (Signed)
LVM w COVID screen, back lab and front door info 1.28.2021 TLJ

## 2019-11-18 ENCOUNTER — Other Ambulatory Visit: Payer: Self-pay | Admitting: Family Medicine

## 2019-11-18 DIAGNOSIS — Z1159 Encounter for screening for other viral diseases: Secondary | ICD-10-CM

## 2019-11-18 DIAGNOSIS — E1169 Type 2 diabetes mellitus with other specified complication: Secondary | ICD-10-CM

## 2019-11-18 DIAGNOSIS — Z125 Encounter for screening for malignant neoplasm of prostate: Secondary | ICD-10-CM

## 2019-11-20 ENCOUNTER — Other Ambulatory Visit: Payer: Self-pay

## 2019-11-20 ENCOUNTER — Other Ambulatory Visit (INDEPENDENT_AMBULATORY_CARE_PROVIDER_SITE_OTHER): Payer: 59

## 2019-11-20 DIAGNOSIS — E1169 Type 2 diabetes mellitus with other specified complication: Secondary | ICD-10-CM | POA: Diagnosis not present

## 2019-11-20 DIAGNOSIS — Z125 Encounter for screening for malignant neoplasm of prostate: Secondary | ICD-10-CM

## 2019-11-20 DIAGNOSIS — Z1159 Encounter for screening for other viral diseases: Secondary | ICD-10-CM

## 2019-11-21 LAB — LIPID PANEL
Cholesterol: 155 mg/dL (ref 0–200)
HDL: 35.8 mg/dL — ABNORMAL LOW (ref >39.00)
LDL Cholesterol: 92 mg/dL (ref 0–99)
NonHDL: 119.53
Total CHOL/HDL Ratio: 4
Triglycerides: 139 mg/dL (ref 0.0–149.0)
VLDL: 27.8 mg/dL (ref 0.0–40.0)

## 2019-11-21 LAB — BASIC METABOLIC PANEL
BUN: 11 mg/dL (ref 6–23)
CO2: 32 mEq/L (ref 19–32)
Calcium: 9.2 mg/dL (ref 8.4–10.5)
Chloride: 101 mEq/L (ref 96–112)
Creatinine, Ser: 0.85 mg/dL (ref 0.40–1.50)
GFR: 93.24 mL/min (ref >60.00)
Glucose, Bld: 94 mg/dL (ref 70–99)
Potassium: 4.4 mEq/L (ref 3.5–5.1)
Sodium: 140 mEq/L (ref 135–145)

## 2019-11-21 LAB — PSA: PSA: 1.5 ng/mL (ref 0.10–4.00)

## 2019-11-23 ENCOUNTER — Other Ambulatory Visit: Payer: Self-pay

## 2019-11-23 ENCOUNTER — Ambulatory Visit (INDEPENDENT_AMBULATORY_CARE_PROVIDER_SITE_OTHER): Payer: 59 | Admitting: Family Medicine

## 2019-11-23 ENCOUNTER — Other Ambulatory Visit: Payer: Self-pay | Admitting: Family Medicine

## 2019-11-23 ENCOUNTER — Encounter: Payer: Self-pay | Admitting: Family Medicine

## 2019-11-23 VITALS — BP 124/76 | HR 96 | Temp 98.4°F | Ht 71.75 in | Wt 263.4 lb

## 2019-11-23 DIAGNOSIS — E1169 Type 2 diabetes mellitus with other specified complication: Secondary | ICD-10-CM

## 2019-11-23 DIAGNOSIS — R079 Chest pain, unspecified: Secondary | ICD-10-CM | POA: Diagnosis not present

## 2019-11-23 DIAGNOSIS — E785 Hyperlipidemia, unspecified: Secondary | ICD-10-CM

## 2019-11-23 DIAGNOSIS — R109 Unspecified abdominal pain: Secondary | ICD-10-CM | POA: Diagnosis not present

## 2019-11-23 DIAGNOSIS — Z0001 Encounter for general adult medical examination with abnormal findings: Secondary | ICD-10-CM | POA: Diagnosis not present

## 2019-11-23 DIAGNOSIS — K219 Gastro-esophageal reflux disease without esophagitis: Secondary | ICD-10-CM

## 2019-11-23 DIAGNOSIS — F17200 Nicotine dependence, unspecified, uncomplicated: Secondary | ICD-10-CM

## 2019-11-23 LAB — HEPATITIS C ANTIBODY
Hepatitis C Ab: NONREACTIVE
SIGNAL TO CUT-OFF: 0.01 (ref <1.00)

## 2019-11-23 LAB — FRUCTOSAMINE: Fructosamine: 238 umol/L (ref 205–285)

## 2019-11-23 MED ORDER — ASPIRIN EC 81 MG PO TBEC: 81.0000 mg | DELAYED_RELEASE_TABLET | Freq: Every day | ORAL | Status: DC

## 2019-11-23 MED ORDER — ATORVASTATIN CALCIUM 20 MG PO TABS: 20.0000 mg | ORAL_TABLET | Freq: Every day | ORAL | 3 refills | Status: DC

## 2019-11-23 NOTE — Progress Notes (Signed)
This visit was conducted in person.  BP 124/76 (BP Location: Left Arm, Patient Position: Sitting, Cuff Size: Large)   Pulse 96   Temp 98.4 F (36.9 C) (Temporal)   Ht 5' 11.75" (1.822 m)   Wt 263 lb 6 oz (119.5 kg)   SpO2 97%   BMI 35.97 kg/m    CC: CPE Subjective:    Patient ID: Mike [name], male    DOB: 1966-09-03, 54 y.o.   MRN: [id number]  HPI: Mike [name] is a 54 y.o. male presenting on 11/23/2019 for Annual Exam, Hip Pain (C/o right hip pain.  Started about 1 mo ago. ), Chest Pain (Co left chest muscule pain.  Located just anterior to axilliary.  Started about 1 mo ago.  Feels like a strain. ), and Abdominal Pain (C/o RUQ abd pain when sneezing.  Started about 1 mo ago after colonoscopy on 10/26/19. )   Lost mother to covid 09/2019 - now caring for disabled step father.   GERD - significant improvement after 1 month omeprazole course - now using PRN heartburn.   Ongoing flank pain, RUQ pain, L chest pain, R hip pain. Denies inciting trauma/injury or falls. No groin pain.   L chest discomfort starts lateral chest wall, describes as pulled muscle as well as tightness of chest - worse with pushing or squeezing something with his arms. He is currently very sedentary - but discomfort can worsen when he gets winded. Better with rest.   Since colonoscopy has had sharp stabbing RUQ pain worse with coughing or sneezing or bending over. PPI 1 month course significantly helped prior GERD symptoms.   17 lb weight loss noted in the past month - he has intentionally decreased food portions.   Preventative: Colonoscopy 10/2019 - multiple polyps ([facility]x12), rpt 1 yr ([place]) Prostate cancer screen - discussed. Would like to start with PSA.  Lung cancer screening - eligible 40+ PY hx - will review at CPE  Flu shot - declines Td 2015 Shingrix - 08/2019, second dose today  Seat belt use discussed Sunscreen use discussed. No changing moles on skin. Smoker - 1 ppd x 40+ yrs Alcohol  - rare Dentist yearly - due Eye exam - due  Lives with brother Claiborne Billings) and fiancee Mamie Nick) Occ: [employer]  Edu: HS, self taught [employer] Activity:  Diet:      Relevant past medical, surgical, family and social history reviewed and updated as indicated. Interim medical history since our last visit reviewed. Allergies and medications reviewed and updated. Outpatient Medications Prior to Visit  Medication Sig Dispense Refill  . diazepam (VALIUM) 5 MG tablet Take 1 tablet (5 mg total) by mouth daily as needed for muscle spasms. 30 tablet 0  . metFORMIN (GLUCOPHAGE) 1000 MG tablet Take 0.5 tablets (500 mg total) by mouth 2 (two) times daily with a meal. 180 tablet 3  . omeprazole (PRILOSEC) 40 MG capsule Take 1 capsule (40 mg total) by mouth daily. For 3 weeks then as needed (Patient taking differently: Take 40 mg by mouth daily. As needed) 30 capsule 3  . sildenafil (REVATIO) 20 MG tablet Take by mouth. Take 1-5 tablets by mouth 1 hour before need    . atorvastatin (LIPITOR) 20 MG tablet Take 0.5 tablets (10 mg total) by mouth daily. 90 tablet 3   No facility-administered medications prior to visit.     Per HPI unless specifically indicated in ROS section below Review of Systems  Constitutional: Negative for activity  change, appetite change, chills, fatigue, fever and unexpected weight change.  HENT: Negative for hearing loss.   Eyes: Negative for visual disturbance.  Respiratory: Positive for cough (smoker's) and wheezing. Negative for chest tightness and shortness of breath.   Cardiovascular: Positive for chest pain. Negative for palpitations and leg swelling.  Gastrointestinal: Negative for abdominal distention, abdominal pain, blood in stool, constipation, diarrhea, nausea and vomiting.  Genitourinary: Negative for difficulty urinating and hematuria.  Musculoskeletal: Negative for arthralgias, myalgias and neck pain.  Skin: Negative for rash.  Neurological:  Negative for dizziness, seizures, syncope and headaches.  Hematological: Negative for adenopathy. Does not bruise/bleed easily.  Psychiatric/Behavioral: Negative for dysphoric mood. The patient is not nervous/anxious.    Objective:    BP 124/76 (BP Location: Left Arm, Patient Position: Sitting, Cuff Size: Large)   Pulse 96   Temp 98.4 F (36.9 C) (Temporal)   Ht 5' 11.75" (1.822 m)   Wt 263 lb 6 oz (119.5 kg)   SpO2 97%   BMI 35.97 kg/m   Wt Readings from Last 3 Encounters:  11/23/19 263 lb 6 oz (119.5 kg)  10/26/19 280 lb (127 kg)  10/08/19 280 lb (127 kg)    Physical Exam Vitals and nursing note reviewed.  Constitutional:      General: He is not in acute distress.    Appearance: Normal appearance. He is well-developed. He is obese. He is not ill-appearing.  HENT:     Head: Normocephalic and atraumatic.     Right Ear: Hearing, tympanic membrane, ear canal and external ear normal.     Left Ear: Hearing, tympanic membrane, ear canal and external ear normal.     Mouth/Throat:     Pharynx: Uvula midline.  Eyes:     General: No scleral icterus.    Extraocular Movements: Extraocular movements intact.     Conjunctiva/sclera: Conjunctivae normal.     Pupils: Pupils are equal, round, and reactive to light.  Cardiovascular:     Rate and Rhythm: Normal rate and regular rhythm.     Pulses: Normal pulses.          Radial pulses are 2+ on the right side and 2+ on the left side.     Heart sounds: Normal heart sounds. No murmur.  Pulmonary:     Effort: Pulmonary effort is normal. No respiratory distress.     Breath sounds: Normal breath sounds. No wheezing, rhonchi or rales.  Chest:     Chest wall: Tenderness (reproducible mid upper sternal pain to palpation - reproduces chest pain described) present.  Abdominal:     General: Abdomen is flat. Bowel sounds are normal. There is no distension.     Palpations: Abdomen is soft. There is no mass.     Tenderness: There is abdominal  tenderness (mild-mod) in the right upper quadrant and epigastric area. There is no right CVA tenderness, left CVA tenderness, guarding or rebound. Negative signs include Murphy's sign.     Hernia: No hernia is present.  Musculoskeletal:        General: Normal range of motion.     Cervical back: Normal range of motion and neck supple.     Right lower leg: No edema.     Left lower leg: No edema.  Lymphadenopathy:     Cervical: No cervical adenopathy.  Skin:    General: Skin is warm and dry.     Findings: No rash.  Neurological:     General: No focal deficit present.  Mental Status: He is alert and oriented to person, place, and time.     Comments: CN grossly intact, station and gait intact  Psychiatric:        Mood and Affect: Mood normal.        Behavior: Behavior normal.        Thought Content: Thought content normal.        Judgment: Judgment normal.       Results for orders placed or performed in visit on 11/23/19  Microalbumin / creatinine urine ratio  Result Value Ref Range   Creatinine, Urine 188 20 - 320 mg/dL   Microalb, Ur 0.4 mg/dL   Microalb Creat Ratio 2 <30 mcg/mg creat  Extra Urine Specimen  Result Value Ref Range   Extra Urine Specimen     Lab Results  Component Value Date   CHOL 155 11/20/2019   HDL 35.80 (L) 11/20/2019   LDLCALC 92 11/20/2019   TRIG 139.0 11/20/2019   CHOLHDL 4 11/20/2019   fructosamine to A1c equivalent of 5.8%.  Lab Results  Component Value Date   HGBA1C 8.4 (H) 09/04/2019    Lab Results  Component Value Date   ALT 34 09/04/2019   AST 21 09/04/2019   ALKPHOS 69 09/04/2019   BILITOT 0.3 09/04/2019    EKG - NSR rate 80s, normal axis, intervals, no acute ST/T changes, frequent PVCs Assessment & Plan:  This visit occurred during the SARS-CoV-2 public health emergency.  Safety protocols were in place, including screening questions prior to the visit, additional usage of staff PPE, and extensive cleaning of exam room while  observing appropriate contact time as indicated for disinfecting solutions.   Problem List Items Addressed This Visit    Type 2 diabetes mellitus with other specified complication ([place]) (Chronic)    He has been taking metformin 1000mg  1/2 tab once daily. With noted weight loss anticipate adequate control - will return in 2 weeks for rpt POC A1c and titrate meds according to results.       Relevant Medications   atorvastatin (LIPITOR) 20 MG tablet   aspirin EC 81 MG tablet   Other Relevant Orders   POCT glycosylated hemoglobin (Hb A1C)   Microalbumin / creatinine urine ratio (Completed)   Ambulatory referral to Cardiology   Smoker    Discussed lung cancer screening CT program - pt interested so will refer.  Continue to encourage attempts towards cessation.       Relevant Orders   Ambulatory Referral for Lung Cancer Scre   Severe obesity (BMI 35.0-39.9) with comorbidity ([place])    Congratulated on marked weight loss noted over the past month - he has been working on [employer] changes (decreased portion sizes). Motivated to continue weight loss efforts.       Right sided abdominal pain    Check abd [place] to further evaluate liver, gallbladder, kidney.       Relevant Orders   US Abdomen Complete   Microalbumin / creatinine urine ratio (Completed)   GERD (gastroesophageal reflux disease)    Treated with 1 month PPI with benefit late 2020 - now using PPI PRN.       Encounter for well adult exam with abnormal findings - Primary    Preventative protocols reviewed and updated unless pt declined. Discussed healthy diet and lifestyle.       Relevant Orders   Microalbumin / creatinine urine ratio (Completed)   Dyslipidemia associated with type 2 diabetes mellitus ([place])    Continue  statin. The 10-year ASCVD risk score Mikey Bussing DC Brooke Bonito., et al., 2013) is: 19.2%   Values used to calculate the score:     Age: 1 years     Sex: Male     Is Non-Hispanic African American: No     Diabetic:  Yes     Tobacco smoker: Yes     Systolic Blood Pressure: 937 mmHg     Is BP treated: No     HDL Cholesterol: 35.8 mg/dL     Total Cholesterol: 155 mg/dL       Relevant Medications   atorvastatin (LIPITOR) 20 MG tablet   aspirin EC 81 MG tablet   Chest pain    Today's description of pain is MSK - reproducible sternal pain ?sternalis syndrome - rec ice/heating pad, OTC voltaren. Update if not improving with treatment.  Given risk factors, check baseline EKG, consider cards eval for further risk stratification. Pt agrees.       Relevant Orders   EKG 12-Lead (Completed)   Microalbumin / creatinine urine ratio (Completed)   Ambulatory referral to Cardiology       Meds ordered this encounter  Medications  . atorvastatin (LIPITOR) 20 MG tablet    Sig: Take 1 tablet (20 mg total) by mouth daily.    Dispense:  90 tablet    Refill:  3    Note new sig  . aspirin EC 81 MG tablet    Sig: Take 1 tablet (81 mg total) by mouth daily.    Dispense:     . diclofenac Sodium (VOLTAREN) 1 % GEL    Sig: Apply 1 application topically 3 (three) times daily.   Orders Placed This Encounter  Procedures  . US Abdomen Complete    Standing Status:   Future    Standing Expiration Date:   01/20/2021    Order Specific Question:   Reason for Exam (SYMPTOM  OR DIAGNOSIS REQUIRED)    Answer:   right sided abd pain    Order Specific Question:   Preferred imaging location?    Answer:   [facility]  . Microalbumin / creatinine urine ratio  . Extra Urine Specimen  . Ambulatory Referral for Lung Cancer Scre    Referral Priority:   Routine    Referral Type:   Consultation    Referral Reason:   Specialty Services Required    Number of Visits Requested:   1  . Ambulatory referral to Cardiology    Referral Priority:   Routine    Referral Type:   Consultation    Referral Reason:   Specialty Services Required    Requested Specialty:   Cardiology    Number of Visits Requested:   1  . POCT  glycosylated hemoglobin (Hb A1C)    Standing Status:   Future    Standing Expiration Date:   12/21/2019  . EKG 12-Lead    Patient instructions: We will refer you for lung cancer screening program [facility]). EKG today We will check abdominal ultrasound for right sided pain.  For sternal pain - do ice or heating pad, and use topical over the counter voltaren gel to the tender area.  Start enteric coated baby aspirin 81mg  daily.  We will refer you to cardiology for evaluation.  Return for lab visit only after 12/05/2019 for fingerstick A1c.  Return in 3 months for follow up visit.   Follow up plan: Return in about 3 months (around 02/20/2020) for follow up visit.  [name],  MD

## 2019-11-23 NOTE — Telephone Encounter (Signed)
Last filled on 09/04/19 #30 with 0 refills  LOV today 11/23/19 for CPE

## 2019-11-23 NOTE — Patient Instructions (Addendum)
We will refer you for lung cancer screening program [facility]). EKG today We will check abdominal ultrasound for right sided pain.  For sternal pain - do ice or heating pad, and use topical over the counter voltaren gel to the tender area.  Start enteric coated baby aspirin 81mg  daily.  We will refer you to cardiology for evaluation.  Return for lab visit only after 12/05/2019 for fingerstick A1c.  Return in 3 months for follow up visit.   Health Maintenance, Male Adopting a healthy lifestyle and getting preventive care are important in promoting health and wellness. Ask your health care provider about:  The right schedule for you to have regular tests and exams.  Things you can do on your own to prevent diseases and keep yourself healthy. What should I know about diet, weight, and exercise? Eat a healthy diet   Eat a diet that includes plenty of vegetables, fruits, low-fat dairy products, and lean protein.  Do not eat a lot of foods that are high in solid fats, added sugars, or sodium. Maintain a healthy weight Body mass index (BMI) is a measurement that can be used to identify possible weight problems. It estimates body fat based on height and weight. Your health care provider can help determine your BMI and help you achieve or maintain a healthy weight. Get regular exercise Get regular exercise. This is one of the most important things you can do for your health. Most adults should:  Exercise for at least 150 minutes each week. The exercise should increase your heart rate and make you sweat (moderate-intensity exercise).  Do strengthening exercises at least twice a week. This is in addition to the moderate-intensity exercise.  Spend less time sitting. Even light physical activity can be beneficial. Watch cholesterol and blood lipids Have your blood tested for lipids and cholesterol at 56 years of age, then have this test every 5 years. You may need to have your cholesterol  levels checked more often if:  Your lipid or cholesterol levels are high.  You are older than 56 years of age.  You are at high risk for heart disease. What should I know about cancer screening? Many types of cancers can be detected early and may often be prevented. Depending on your health history and family history, you may need to have cancer screening at various ages. This may include screening for:  Colorectal cancer.  Prostate cancer.  Skin cancer.  Lung cancer. What should I know about heart disease, diabetes, and high blood pressure? Blood pressure and heart disease  High blood pressure causes heart disease and increases the risk of stroke. This is more likely to develop in people who have high blood pressure readings, are of African descent, or are overweight.  Talk with your health care provider about your target blood pressure readings.  Have your blood pressure checked: ? Every 3-5 years if you are 81-66 years of age. ? Every year if you are 20 years old or older.  If you are between the ages of 66 and 8 and are a current or former smoker, ask your health care provider if you should have a one-time screening for abdominal aortic aneurysm (AAA). Diabetes Have regular diabetes screenings. This checks your fasting blood sugar level. Have the screening done:  Once every three years after age 10 if you are at a normal weight and have a low risk for diabetes.  More often and at a younger age if you are overweight or have  a high risk for diabetes. What should I know about preventing infection? Hepatitis B If you have a higher risk for hepatitis B, you should be screened for this virus. Talk with your health care provider to find out if you are at risk for hepatitis B infection. Hepatitis C Blood testing is recommended for:  Everyone born from [birth year] through 1967.  Anyone with known risk factors for hepatitis C. Sexually transmitted infections (STIs)  You should be  screened each year for STIs, including gonorrhea and chlamydia, if: ? You are sexually active and are younger than 56 years of age. ? You are older than 56 years of age and your health care provider tells you that you are at risk for this type of infection. ? Your sexual activity has changed since you were last screened, and you are at increased risk for chlamydia or gonorrhea. Ask your health care provider if you are at risk.  Ask your health care provider about whether you are at high risk for HIV. Your health care provider may recommend a prescription medicine to help prevent HIV infection. If you choose to take medicine to prevent HIV, you should first get tested for HIV. You should then be tested every 3 months for as long as you are taking the medicine. Follow these instructions at home: Lifestyle  Do not use any products that contain nicotine or tobacco, such as cigarettes, e-cigarettes, and chewing tobacco. If you need help quitting, ask your health care provider.  Do not use street drugs.  Do not share needles.  Ask your health care provider for help if you need support or information about quitting drugs. Alcohol use  Do not drink alcohol if your health care provider tells you not to drink.  If you drink alcohol: ? Limit how much you have to 0-2 drinks a day. ? Be aware of how much alcohol is in your drink. In the [place] one drink equals one 12 oz bottle of beer (355 mL), one 5 oz glass of wine (148 mL), or one 1 oz glass of hard liquor (44 mL). General instructions  Schedule regular health, dental, and eye exams.  Stay current with your vaccines.  Tell your health care provider if: ? You often feel depressed. ? You have ever been abused or do not feel safe at home. Summary  Adopting a healthy lifestyle and getting preventive care are important in promoting health and wellness.  Follow your health care provider's instructions about healthy diet, exercising, and getting  tested or screened for diseases.  Follow your health care provider's instructions on monitoring your cholesterol and blood pressure. This information is not intended to replace advice given to you by your health care provider. Make sure you discuss any questions you have with your health care provider. Document Revised: 09/27/2018 Document Reviewed: 09/27/2018 Elsevier Patient Education  2020 [employer].

## 2019-11-24 LAB — MICROALBUMIN / CREATININE URINE RATIO
Creatinine, Urine: 188 mg/dL (ref 20–320)
Microalb Creat Ratio: 2 mcg/mg creat (ref <30)
Microalb, Ur: 0.4 mg/dL

## 2019-11-24 LAB — EXTRA URINE SPECIMEN

## 2019-11-25 DIAGNOSIS — Z0001 Encounter for general adult medical examination with abnormal findings: Secondary | ICD-10-CM | POA: Insufficient documentation

## 2019-11-25 DIAGNOSIS — R079 Chest pain, unspecified: Secondary | ICD-10-CM | POA: Insufficient documentation

## 2019-11-25 DIAGNOSIS — K219 Gastro-esophageal reflux disease without esophagitis: Secondary | ICD-10-CM | POA: Insufficient documentation

## 2019-11-25 DIAGNOSIS — E1169 Type 2 diabetes mellitus with other specified complication: Secondary | ICD-10-CM | POA: Insufficient documentation

## 2019-11-25 MED ORDER — DICLOFENAC SODIUM 1 % EX GEL: 1.0000 "application " | Freq: Three times a day (TID) | CUTANEOUS | Status: DC

## 2019-11-25 NOTE — Assessment & Plan Note (Signed)
Treated with 1 month PPI with benefit late 2020 - now using PPI PRN.

## 2019-11-25 NOTE — Assessment & Plan Note (Signed)
Continue statin. The 10-year ASCVD risk score Mikey Bussing DC Brooke Bonito., et al., 2013) is: 19.2%   Values used to calculate the score:     Age: 54 years     Sex: Male     Is Non-Hispanic African American: No     Diabetic: Yes     Tobacco smoker: Yes     Systolic Blood Pressure: 828 mmHg     Is BP treated: No     HDL Cholesterol: 35.8 mg/dL     Total Cholesterol: 155 mg/dL

## 2019-11-25 NOTE — Assessment & Plan Note (Signed)
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 Update if not improving with treatment.  Given risk factors, check baseline EKG, consider cards eval for further risk stratification. Pt agrees.

## 2019-11-25 NOTE — Assessment & Plan Note (Signed)
Congratulated on marked weight loss noted over the past month - he has been working on [employer] changes (decreased portion sizes). Motivated to continue weight loss efforts.

## 2019-11-25 NOTE — Assessment & Plan Note (Signed)
Check abd [place] to further evaluate liver, gallbladder, kidney.

## 2019-11-25 NOTE — Assessment & Plan Note (Signed)
Discussed lung cancer screening CT program - pt interested so will refer.  Continue to encourage attempts towards cessation.

## 2019-11-25 NOTE — Assessment & Plan Note (Signed)
Preventative protocols reviewed and updated unless pt declined. Discussed healthy diet and lifestyle.  

## 2019-11-25 NOTE — Assessment & Plan Note (Signed)
He has been taking metformin 1000mg  1/2 tab once daily. With noted weight loss anticipate adequate control - will return in 2 weeks for rpt POC A1c and titrate meds according to results.

## 2019-11-25 NOTE — Telephone Encounter (Signed)
ERx 

## 2019-11-27 ENCOUNTER — Encounter: Payer: Self-pay | Admitting: Cardiology

## 2019-11-27 ENCOUNTER — Ambulatory Visit (INDEPENDENT_AMBULATORY_CARE_PROVIDER_SITE_OTHER): Payer: 59 | Admitting: Cardiology

## 2019-11-27 ENCOUNTER — Other Ambulatory Visit: Payer: Self-pay

## 2019-11-27 VITALS — BP 138/82 | HR 85 | Ht 72.0 in | Wt 269.0 lb

## 2019-11-27 DIAGNOSIS — F17200 Nicotine dependence, unspecified, uncomplicated: Secondary | ICD-10-CM | POA: Diagnosis not present

## 2019-11-27 DIAGNOSIS — R079 Chest pain, unspecified: Secondary | ICD-10-CM | POA: Diagnosis not present

## 2019-11-27 DIAGNOSIS — E7800 Pure hypercholesterolemia, unspecified: Secondary | ICD-10-CM

## 2019-11-27 NOTE — Progress Notes (Signed)
Cardiology Office Note:    Date:  11/27/2019   ID:  Mike [name], DOB 10/13/1966, MRN [id number]  PCP:  [name], MD  Cardiologist:  Kate Sable, MD  Electrophysiologist:  None   Referring MD: [name], MD   Chief Complaint  Patient presents with  . New Patient (Initial Visit)    Referred by PCP for Chest tightness across the chest.\. Meds reviewed verbally with patient.    Mike [name] is a 54 y.o. male who is being seen today for the evaluation of chest pain at the request of [name], MD.   History of Present Illness:    Mike [name] is a 54 y.o. male with a hx of hyperlipidemia, obesity, current smoker x40 years, diabetes who presents due to chest pain.  Patient states having chest pain over the past 2 weeks.  The pain which he describes as band-like across his chest usually occurs when he pushes against something.  The pain is sometimes reproducible with palpation.  He denies any chest pain or shortness of breath with exertion such as walking or going up stairs.  He denies any history of cardiac disease.  States his mother had a stent in her 50s.  His dad has a history of pacemaker.  Patient is a current smoker x40 years.  He is working on quitting smoking.  Past Medical History:  Diagnosis Date  . Arthritis   . Diabetes ([place])   . GERD (gastroesophageal reflux disease)   . 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 High cholesterol   . OSA (obstructive sleep apnea) 09/15/2019   Sleep study 12/2017 - severe OSA with AHI 65.6, desat to 76% rec CPAP  . Severe obesity (BMI 35.0-39.9) with comorbidity ([place]) 09/04/2019  . Sleep apnea    doesn't wear CPAP often    Past Surgical History:  Procedure Laterality Date  . CYSTECTOMY     knee- left knee cyst    Current Medications: Current Meds  Medication Sig  . aspirin EC 81 MG tablet Take 1 tablet (81 mg total) by mouth daily.  [name] atorvastatin (LIPITOR) 20 MG tablet Take 1 tablet  (20 mg total) by mouth daily.  . diazepam (VALIUM) 5 MG tablet TAKE 1 TABLET (5 MG TOTAL) BY MOUTH DAILY AS NEEDED FOR MUSCLE SPASMS.  [name] diclofenac Sodium (VOLTAREN) 1 % GEL Apply 1 application topically 3 (three) times daily.  . metFORMIN (GLUCOPHAGE) 1000 MG tablet Take 0.5 tablets (500 mg total) by mouth 2 (two) times daily with a meal.  . omeprazole (PRILOSEC) 40 MG capsule Take 1 capsule (40 mg total) by mouth daily. For 3 weeks then as needed (Patient taking differently: Take 40 mg by mouth daily. As needed)  . sildenafil (REVATIO) 20 MG tablet Take by mouth. Take 1-5 tablets by mouth 1 hour before need     Allergies:   Patient has no known allergies.   Social History   Socioeconomic History  . Marital status: Divorced    Spouse name: Not on file  . Number of children: Not on file  . Years of education: Not on file  . Highest education level: Not on file  Occupational History  . Not on file  Tobacco Use  . Smoking status: Current Every Day Smoker    Packs/day: 1.00    Years: 40.00    Pack years: 40.00  . Smokeless tobacco: Never Used  Substance and Sexual Activity  . Alcohol use:  Yes    Comment: Rarely  . Drug use: Never  . Sexual activity: Yes    Partners: Female  Other Topics Concern  . Not on file  Social History Narrative   Lives with brother Claiborne Billings) and fiancee Mamie Nick)   Occ: Civil engineer, contracting    Edu: HS, self taught [employer]   Activity:    Diet:    Social Determinants of Health   Financial Resource Strain:   . Difficulty of Paying Living Expenses: Not on file  Food Insecurity:   . Worried About Charity fundraiser in the Last Year: Not on file  . Ran Out of Food in the Last Year: Not on file  Transportation Needs:   . Lack of Transportation (Medical): Not on file  . Lack of Transportation (Non-Medical): Not on file  Physical Activity:   . Days of Exercise per Week: Not on file  . Minutes of Exercise per Session: Not on file  Stress:     . Feeling of Stress : Not on file  Social Connections:   . Frequency of Communication with Friends and Family: Not on file  . Frequency of Social Gatherings with Friends and Family: Not on file  . Attends Religious Services: Not on file  . Active Member of Clubs or Organizations: Not on file  . Attends Archivist Meetings: Not on file  . Marital Status: Not on file     Family History: The patient's family history includes CAD in his mother. There is no history of Stroke, Diabetes, Cancer, Colon cancer, Esophageal cancer, Stomach cancer, or Rectal cancer.  ROS:   Please see the history of present illness.     All other systems reviewed and are negative.  EKGs/Labs/Other Studies Reviewed:    The following studies were reviewed today:   EKG:  EKG is  ordered today.  The ekg ordered today demonstrates normal sinus rhythm, occasional PVC.  Recent Labs: 09/04/2019: ALT 34; Hemoglobin 14.2; Platelets 249.0 11/20/2019: BUN 11; Creatinine, Ser 0.85; Potassium 4.4; Sodium 140  Recent Lipid Panel    Component Value Date/Time   CHOL 155 11/20/2019 1516   TRIG 139.0 11/20/2019 1516   HDL 35.80 (L) 11/20/2019 1516   CHOLHDL 4 11/20/2019 1516   VLDL 27.8 11/20/2019 1516   LDLCALC 92 11/20/2019 1516    Physical Exam:    VS:  BP 138/82 (BP Location: Right Arm, Patient Position: Sitting, Cuff Size: Large)   Pulse 85   Ht 6' (1.829 m)   Wt 269 lb (122 kg)   SpO2 96%   BMI 36.48 kg/m     Wt Readings from Last 3 Encounters:  11/27/19 269 lb (122 kg)  11/23/19 263 lb 6 oz (119.5 kg)  10/26/19 280 lb (127 kg)     GEN:  Well nourished, well developed in no acute distress HEENT: Normal NECK: No JVD; No carotid bruits LYMPHATICS: No lymphadenopathy CARDIAC: RRR, no murmurs, rubs, gallops RESPIRATORY:  Clear to auscultation without rales, wheezing or rhonchi  ABDOMEN: Soft, non-tender, non-distended MUSCULOSKELETAL: Midsternal tenderness noted on palpation. SKIN: Warm and  dry NEUROLOGIC:  Alert and oriented x 3 PSYCHIATRIC:  Normal affect   ASSESSMENT:    1. Chest pain of uncertain etiology   2. Pure hypercholesterolemia   3. Smoking    PLAN:    In order of problems listed above:  1. Patient with a 2-week history of atypical chest pain.  Pain is reproducible with palpation.  Pain is not  related with exertion.  His chest pain symptoms are very atypical and consistent with a musculoskeletal etiology such as costochondritis or myalgias.  Patient educated on cardiac chest pain and its presentation.  Patient reassured.  If he develops cardiac pain or persistent symptoms, will consider evaluation at that point. 2. Patient with history of hyperlipidemia, continue Lipitor. 3. Patient is a 40-year smoker.  Cessation advised.  Over 5 minutes spent counseling patient.  Follow-up as needed  This note was generated in part or whole with voice recognition software. Voice recognition is usually quite accurate but there are transcription errors that can and very often do occur. I apologize for any typographical errors that were not detected and corrected.  Medication Adjustments/Labs and Tests Ordered: Current medicines are reviewed at length with the patient today.  Concerns regarding medicines are outlined above.  Orders Placed This Encounter  Procedures  . EKG 12-Lead   No orders of the defined types were placed in this encounter.   Patient Instructions  Medication Instructions:  Your physician recommends that you continue on your current medications as directed. Please refer to the Current Medication list given to you today.  *If you need a refill on your cardiac medications before your next appointment, please call your pharmacy*  Lab Work: none If you have labs (blood work) drawn today and your tests are completely normal, you will receive your results only by: [name] MyChart Message (if you have MyChart) OR . A paper copy in the mail If you have any lab  test that is abnormal or we need to change your treatment, we will call you to review the results.  Testing/Procedures: none  Follow-Up: At [facility], you and your health needs are our priority.  As part of our continuing mission to provide you with exceptional heart care, we have created designated Provider Care Teams.  These Care Teams include your primary Cardiologist (physician) and Advanced Practice Providers (APPs -  Physician Assistants and Nurse Practitioners) who all work together to provide you with the care you need, when you need it.  Your next appointment:   As needed.   The format for your next appointment:   In Person  Provider:   Kate Sable, MD    Signed, Kate Sable, MD  11/27/2019 10:45 AM    [place]

## 2019-11-27 NOTE — Patient Instructions (Signed)
Medication Instructions:  Your physician recommends that you continue on your current medications as directed. Please refer to the Current Medication list given to you today.  *If you need a refill on your cardiac medications before your next appointment, please call your pharmacy*  Lab Work: none If you have labs (blood work) drawn today and your tests are completely normal, you will receive your results only by: [name] MyChart Message (if you have MyChart) OR . A paper copy in the mail If you have any lab test that is abnormal or we need to change your treatment, we will call you to review the results.  Testing/Procedures: none  Follow-Up: At [facility], you and your health needs are our priority.  As part of our continuing mission to provide you with exceptional heart care, we have created designated Provider Care Teams.  These Care Teams include your primary Cardiologist (physician) and Advanced Practice Providers (APPs -  Physician Assistants and Nurse Practitioners) who all work together to provide you with the care you need, when you need it.  Your next appointment:   As needed.   The format for your next appointment:   In Person  Provider:   Kate Sable, MD

## 2019-11-29 ENCOUNTER — Other Ambulatory Visit: Payer: Self-pay | Admitting: *Deleted

## 2019-11-29 DIAGNOSIS — F17210 Nicotine dependence, cigarettes, uncomplicated: Secondary | ICD-10-CM

## 2019-11-30 ENCOUNTER — Ambulatory Visit
Admission: RE | Admit: 2019-11-30 | Discharge: 2019-11-30 | Disposition: A | Discharge status: Home / Self Care | Payer: 59 | Source: Ambulatory Visit | Attending: Family Medicine | Admitting: Family Medicine

## 2019-11-30 ENCOUNTER — Other Ambulatory Visit: Payer: Self-pay

## 2019-11-30 DIAGNOSIS — R109 Unspecified abdominal pain: Secondary | ICD-10-CM

## 2019-12-05 ENCOUNTER — Encounter: Payer: Self-pay | Admitting: Family Medicine

## 2019-12-05 ENCOUNTER — Other Ambulatory Visit (INDEPENDENT_AMBULATORY_CARE_PROVIDER_SITE_OTHER): Payer: 59

## 2019-12-05 DIAGNOSIS — K760 Fatty (change of) liver, not elsewhere classified: Secondary | ICD-10-CM

## 2019-12-05 DIAGNOSIS — E1169 Type 2 diabetes mellitus with other specified complication: Secondary | ICD-10-CM | POA: Diagnosis not present

## 2019-12-05 HISTORY — DX: Fatty (change of) liver, not elsewhere classified: K76.0

## 2019-12-05 LAB — POCT GLYCOSYLATED HEMOGLOBIN (HGB A1C): Hemoglobin A1C: 7.2 % — AB (ref 4.0–5.6)

## 2019-12-06 ENCOUNTER — Other Ambulatory Visit: Payer: 59

## 2019-12-11 ENCOUNTER — Telehealth: Payer: Self-pay

## 2019-12-11 MED ORDER — TRAMADOL HCL 50 MG PO TABS: 25.0000 mg | ORAL_TABLET | Freq: Three times a day (TID) | ORAL | 0 refills | Status: DC | PRN

## 2019-12-11 NOTE — Telephone Encounter (Signed)
At his recent CPE, he was having some sciatic pain. He said it has gotten worse to the point he feels debilitated. Pulled some muscles in his abdomen and ribs from pulling up. He is asking what the next step would be.  He is asking for something for pain. He is currently taking tylenol Extra Strength 2 to 3 tablets every 4 hours. I did advise him not to take more than 4000mg  in a day.

## 2019-12-11 NOTE — Telephone Encounter (Signed)
Pt called back  Wanting to know what he needs to do.   He is having problems going up and down stairs  Cannot get in his jeep. Hurts to laugh/cough  Offered to make appointment for 2/24 pt declined wanted to wait to hear from dr g.

## 2019-12-11 NOTE — Telephone Encounter (Signed)
plz notify I've sent in tramadol synthetic opioid pain medication to help pain. Use sparingly, don't take it and drive due to sedation risk, don't mix with diazepam.  However given acute worsening, do recommend further in office eval - can we schedule him with available provider tomorrow?

## 2019-12-11 NOTE — Telephone Encounter (Signed)
Spoke with pt relaying Dr. Synthia Innocent message.  Verbalizes understanding and scheduled OV tomorrow with Dr. Einar Pheasant at 10:00.

## 2019-12-12 ENCOUNTER — Other Ambulatory Visit: Payer: Self-pay

## 2019-12-12 ENCOUNTER — Telehealth: Payer: Self-pay

## 2019-12-12 ENCOUNTER — Ambulatory Visit (INDEPENDENT_AMBULATORY_CARE_PROVIDER_SITE_OTHER)
Admission: RE | Admit: 2019-12-12 | Discharge: 2019-12-12 | Disposition: A | Discharge status: Home / Self Care | Payer: 59 | Source: Ambulatory Visit | Attending: Family Medicine | Admitting: Family Medicine

## 2019-12-12 ENCOUNTER — Telehealth: Payer: Self-pay | Admitting: *Deleted

## 2019-12-12 ENCOUNTER — Ambulatory Visit
Admission: RE | Admit: 2019-12-12 | Discharge: 2019-12-12 | Disposition: A | Discharge status: Home / Self Care | Payer: 59 | Source: Ambulatory Visit | Attending: Family Medicine | Admitting: Family Medicine

## 2019-12-12 ENCOUNTER — Ambulatory Visit (INDEPENDENT_AMBULATORY_CARE_PROVIDER_SITE_OTHER): Payer: 59 | Admitting: Family Medicine

## 2019-12-12 VITALS — BP 124/96 | HR 93 | Temp 97.9°F | Resp 20 | Ht 71.75 in | Wt 264.5 lb

## 2019-12-12 DIAGNOSIS — C7951 Secondary malignant neoplasm of bone: Secondary | ICD-10-CM | POA: Insufficient documentation

## 2019-12-12 DIAGNOSIS — C3490 Malignant neoplasm of unspecified part of unspecified bronchus or lung: Secondary | ICD-10-CM

## 2019-12-12 DIAGNOSIS — R918 Other nonspecific abnormal finding of lung field: Secondary | ICD-10-CM

## 2019-12-12 DIAGNOSIS — R0781 Pleurodynia: Secondary | ICD-10-CM

## 2019-12-12 DIAGNOSIS — R062 Wheezing: Secondary | ICD-10-CM

## 2019-12-12 DIAGNOSIS — R911 Solitary pulmonary nodule: Secondary | ICD-10-CM | POA: Diagnosis not present

## 2019-12-12 MED ORDER — HYDROCODONE-ACETAMINOPHEN 5-325 MG PO TABS: 1.0000 | ORAL_TABLET | Freq: Four times a day (QID) | ORAL | 0 refills | Status: DC | PRN

## 2019-12-12 NOTE — Addendum Note (Signed)
Addended by: [name] on: 12/12/2019 05:00 PM   Modules accepted: Orders

## 2019-12-12 NOTE — Telephone Encounter (Signed)
Reviewed film. [name], MD will call patient to notify of results.   Urgent referral for Thoracic oncology

## 2019-12-12 NOTE — Telephone Encounter (Signed)
Alvie Heidelberg from radiology left a voicemail wanting to make sure that Dr.Cody got the CT results and requested a call back.  Called back and spoke to Diane and was advised that report is in Epic for the doctor to see.Marland KitchenMarland Kitchen

## 2019-12-12 NOTE — Telephone Encounter (Signed)
Spoke with patient.  Agree with thoracic clinic referral - can we get patient into multidisciplinary thoracic clinic? I have also ordered PET scan and will increase pain med to hydrocodone for likely cancer related pain.

## 2019-12-12 NOTE — Telephone Encounter (Signed)
Noted. Thank you for seeing him.

## 2019-12-12 NOTE — Progress Notes (Signed)
Subjective:     Mike [name] is a 54 y.o. male presenting for Right side pain follow up (worsening)     HPI   #Right side pain - initially started with sciatic pain - was having to push up to get out of chairs and thinks that he strained a chest muscle - has been having spasm pains in right - RUQ pain - spasm pain - had [place] on 11/30/2019 - things have gotten worse on the right side - sciatic pain is improving but not healed - Treatment: Tylenol, PCP called in tramadol but he feels this is not working at all - worse - with coughing/laughing - feels like he is getting congested because he cannot cough/sneeze  - certain burps will cause the pulling feeling - Has not tried ibuprofen/aleve that he knows of - girlfriend brings medicaiton - takes valium for occasional for muscle spasms in neck > has not tried for his pain currently    Review of Systems   Social History   Tobacco Use  Smoking Status Current Every Day Smoker  . Packs/day: 1.00  . Years: 40.00  . Pack years: 40.00  Smokeless Tobacco Never Used        Objective:    BP Readings from Last 3 Encounters:  12/12/19 (!) 124/96  11/27/19 138/82  11/23/19 124/76   Wt Readings from Last 3 Encounters:  12/12/19 264 lb 8 oz (120 kg)  11/27/19 269 lb (122 kg)  11/23/19 263 lb 6 oz (119.5 kg)    BP (!) 124/96   Pulse 93   Temp 97.9 F (36.6 C)   Resp 20   Ht 5' 11.75" (1.822 m)   Wt 264 lb 8 oz (120 kg)   SpO2 96%   BMI 36.12 kg/m    Physical Exam Constitutional:      Appearance: Normal appearance. He is not ill-appearing or diaphoretic.  HENT:     Right Ear: External ear normal.     Left Ear: External ear normal.     Nose: Nose normal.  Eyes:     General: No scleral icterus.    Extraocular Movements: Extraocular movements intact.     Conjunctiva/sclera: Conjunctivae normal.  Cardiovascular:     Rate and Rhythm: Normal rate and regular rhythm.     Heart sounds: Murmur present.  Pulmonary:       Effort: Pulmonary effort is normal. No respiratory distress.     Breath sounds: Examination of the right-lower field reveals wheezing. Decreased breath sounds and wheezing present.  Chest:     Chest wall: Tenderness (mid-sternal TTP, TTP along the entire right side worse over the lower ribs) present.  Musculoskeletal:     Cervical back: Neck supple.     Comments: Difficulty getting out of chair and stepping on exam table 2/2 to pain  Skin:    General: Skin is warm and dry.  Neurological:     Mental Status: He is alert. Mental status is at baseline.  Psychiatric:        Mood and Affect: Mood normal.    DG Chest 2 View CLINICAL DATA:  RIGHT-side rib pain, wheezing on exam question pneumonia, rib injury, history diabetes mellitus, GERD, smoker with 40 pack-year smoking history  EXAM: CHEST - 2 VIEW  COMPARISON:  None  FINDINGS: Normal heart size, mediastinal contours, and pulmonary vascularity.  Focal opacity identified in the RIGHT upper lobe in the perihilar region, approximately 6.9 x 3.6 x 3.6 cm, appears mass-like  on lateral view, question tumor versus infiltrate.  Remaining lungs clear.  No pleural effusion or pneumothorax.  Osseous structures unremarkable.  IMPRESSION: Focal RIGHT upper lobe opacity 3.6 x 3.6 x 6.9 cm question mass versus infiltrate; CT chest with contrast recommended to exclude neoplasm.  These results will be called to the ordering clinician or representative by the Radiologist Assistant, and communication documented in the PACS or zVision Dashboard.  Electronically Signed   By: Lavonia Dana M.D.   On: 12/12/2019 11:27         Assessment & Plan:   Problem List Items Addressed This Visit      Other   Mass of upper lobe of right lung    CXR with RUL opacity neoplasm vs infiltrate. Given smoking history and lack of infectious symptoms (no fever/chills, SOB) will get stat CT to evaluate further.       Relevant Orders   CT Chest  Wo Contrast   Rib pain on right side - Primary    Suspect MSK, though with lung opacity. W/o fever/chills, will treat as MSK injury with pain relievers. Monitor for signs of pneumonia. And will get CT chest to further evaluate opacity.       Relevant Orders   DG Chest 2 View (Completed)    Other Visit Diagnoses    Wheezing       Relevant Orders   DG Chest 2 View (Completed)   Solitary pulmonary nodule       Relevant Orders   CT Chest Wo Contrast       Return in about 2 weeks (around 12/26/2019).  Lesleigh Noe, MD

## 2019-12-12 NOTE — Telephone Encounter (Signed)
Noted. Stat CT ordered as follow-up imaging to CXR from today

## 2019-12-12 NOTE — Patient Instructions (Signed)
Pain management 1) Take 600-800 mg of Ibuprofen every 8 hours 2) Take 1000 mg of Tylenol every 8 hours 3) Use heat or Ice to see if that helps 4) Try take deep breaths as tolerated to prevent pneumonia 5) Try Valium or Tramadol (not together) to see if those help   I will follow-up with the final read of the Chest X-Ray Concerning for possible Mass

## 2019-12-12 NOTE — Telephone Encounter (Signed)
Dr. Einar Pheasant notified by telephone that report is in Epic for her review.

## 2019-12-12 NOTE — Telephone Encounter (Signed)
Received Call Report from [facility], Stacy Snead, for [facility], Focal Right Upper opacity, ? Mass.

## 2019-12-12 NOTE — Assessment & Plan Note (Signed)
CXR with RUL opacity neoplasm vs infiltrate. Given smoking history and lack of infectious symptoms (no fever/chills, SOB) will get stat CT to evaluate further.

## 2019-12-12 NOTE — Assessment & Plan Note (Signed)
Suspect MSK, though with lung opacity. W/o fever/chills, will treat as MSK injury with pain relievers. Monitor for signs of pneumonia. And will get CT chest to further evaluate opacity.

## 2019-12-13 ENCOUNTER — Other Ambulatory Visit: Payer: Self-pay | Admitting: Acute Care

## 2019-12-13 ENCOUNTER — Telehealth: Payer: Self-pay | Admitting: Acute Care

## 2019-12-13 DIAGNOSIS — R918 Other nonspecific abnormal finding of lung field: Secondary | ICD-10-CM

## 2019-12-13 NOTE — Progress Notes (Signed)
I received a message from [facility] this morning regarding Mr. Rogowski's CT Chest, asking for best steps for moving forward with getting diagnosis and treatment of the new pulmonary mass noted on the scan 12/12/2019 . Dr. Valeta Harms was in the office this morning and able to review the CT. He recommends FOB with EBUS , Radial and Cryo. He plans on biopsy of one of the involved lymph nodes for diagnosis. There is available bronch suite/OR time this Tuesday am ( 12/18/2019). An ambulatory referral to Pulmonary has been placed. Our patient care coordinator is working on getting this scheduled now . I have called Mr. Payer. I explained that we can move forward with the above noted procedure this Tuesday 3/2. I explained the procedure, and the plan to biopsy the node involved for tissue diagnosis to allow for best treatment moving forward.  He is in agreement with this plan. I explained that he would need Covid testing either tomorrow ( Friday)  or Saturday prior to the procedure. He is on 81 mg daily of ASA. I explained that Dr. Valeta Harms would meet with him prior to the procedure on 3/2 as a formal consult, to explain the plan and answer any questions. The patient verbalized agreement of this plan. I explained that he will receive phone calls today with more information and specific times and location of the procedure on Tuesday. He verbalized understanding. I will notify [facility] that I have spoken with the patient, and to keep them updated.

## 2019-12-13 NOTE — Addendum Note (Signed)
Addended by: [name] on: 12/13/2019 09:09 AM   Modules accepted: Orders

## 2019-12-13 NOTE — Telephone Encounter (Signed)
I have called Mike [name] and the patient. He is scheduled for Bronch EBUS with Dr. Valeta Harms this Tuesday 3/2/20201 . Thanks

## 2019-12-13 NOTE — Telephone Encounter (Signed)
Spoke with Mike [name] with [facility] and was advised pt had a CT chest on 2/24 and revealed abnormal results. Pt was scheduled for a SDMV and LDCT with Mike Form, NP, on 3/8. Mike [name] is questioning if pt should be referred to our office first or should pt bypass pulmonary and be referred to TCTS. Pt already has an oncology referral pending by PCP. Pt is scheduled for an upcoming PET scan on 3/2. Mike [name]'s direct line is [phone].  Mike [name] please advise. Thanks.

## 2019-12-14 ENCOUNTER — Other Ambulatory Visit
Admission: RE | Admit: 2019-12-14 | Discharge: 2019-12-14 | Disposition: A | Discharge status: Home / Self Care | Payer: 59 | Source: Ambulatory Visit | Attending: Pulmonary Disease | Admitting: Pulmonary Disease

## 2019-12-14 ENCOUNTER — Telehealth: Payer: Self-pay | Admitting: Pulmonary Disease

## 2019-12-14 ENCOUNTER — Other Ambulatory Visit: Payer: 59

## 2019-12-14 ENCOUNTER — Other Ambulatory Visit: Payer: Self-pay

## 2019-12-14 ENCOUNTER — Encounter (HOSPITAL_COMMUNITY): Payer: Self-pay | Admitting: Pulmonary Disease

## 2019-12-14 DIAGNOSIS — Z01812 Encounter for preprocedural laboratory examination: Secondary | ICD-10-CM | POA: Insufficient documentation

## 2019-12-14 DIAGNOSIS — Z20822 Contact with and (suspected) exposure to covid-19: Secondary | ICD-10-CM | POA: Insufficient documentation

## 2019-12-14 LAB — SARS CORONAVIRUS 2 (TAT 6-24 HRS): SARS Coronavirus 2: NEGATIVE

## 2019-12-14 NOTE — Telephone Encounter (Signed)
Mike [name] has been entered and id pending Mike [name]

## 2019-12-14 NOTE — Progress Notes (Signed)
Spoke with pt for pre-op call. Pt denies cardiac history. Pt is a type 2 diabetic. Last A1C was 7.2 on 12/05/19. Pt doesn't check his blood sugar at home, he does have a meter though. Pt instructed to check his blood sugar the morning of the procedure. If blood sugar is 70 or below, treat with 1/2 cup of clear juice (apple or cranberry) and recheck blood sugar 15 minutes after drinking juice. Instructed pt to let his nurse know when he arrives if he had to drink juice that morning. He voiced understanding.  Covid test done today. Pt states he's been in quarantine since test was done and he understands that quarantine continues until he comes to the hospital on Tuesday.

## 2019-12-17 ENCOUNTER — Telehealth: Payer: Self-pay | Admitting: *Deleted

## 2019-12-17 ENCOUNTER — Encounter (HOSPITAL_COMMUNITY): Payer: Self-pay | Admitting: Pulmonary Disease

## 2019-12-17 ENCOUNTER — Other Ambulatory Visit: Payer: Self-pay | Admitting: Family Medicine

## 2019-12-17 DIAGNOSIS — R918 Other nonspecific abnormal finding of lung field: Secondary | ICD-10-CM

## 2019-12-17 NOTE — Telephone Encounter (Signed)
Oncology Nurse Navigator Documentation  Oncology Nurse Navigator Flowsheets 12/17/2019  Navigator Location [facility]  Referral Date to RadOnc/MedOnc 12/14/2019  Navigator Encounter Type Telephone/I received referral on Mr. Mike [name].  I called and updated him on his appt with Dr. Julien Nordmann. He verbalized understanding.    Telephone Outgoing Call  Treatment Phase Abnormal Scans  Barriers/Navigation Needs Coordination of Care;Education  Education Other  Interventions Coordination of Care;Education  Acuity Level 2-Minimal Needs (1-2 Barriers Identified)  Coordination of Care Appts  Education Method Verbal  Time Spent with Patient 15

## 2019-12-17 NOTE — Telephone Encounter (Signed)
Name of Medication:Hydrocodone-APAP Name of Pharmacy: [facility] [place], [place] or Written Date and Quantity: 12/12/2019 #30  Last Office Visit and Type: 12/24/202 Next Office Visit and Type:none Last Controlled Substance Agreement

## 2019-12-17 NOTE — Telephone Encounter (Signed)
[employer] has approved this EBUS Auth@ [id number] Joellen [place]

## 2019-12-17 NOTE — Anesthesia Preprocedure Evaluation (Addendum)
Anesthesia Evaluation  Patient identified by MRN, date of birth, ID band Patient awake    Reviewed: Allergy & Precautions, NPO status , Patient's Chart, lab work & pertinent test results  Airway Mallampati: III  TM Distance: >3 FB Neck ROM: Full    Dental no notable dental hx. (+) Teeth Intact   Pulmonary sleep apnea and Continuous Positive Airway Pressure Ventilation , Current Smoker and Patient abstained from smoking.,  RUL lung mass   Pulmonary exam normal breath sounds clear to auscultation       Cardiovascular negative cardio ROS Normal cardiovascular exam+ Valvular Problems/Murmurs  Rhythm:Regular Rate:Normal     Neuro/Psych negative neurological ROS  negative psych ROS   GI/Hepatic GERD  Medicated and Controlled,Fatty liver   Endo/Other  diabetes, Well Controlled, Type 2, Oral Hypoglycemic AgentsObesity Hypercholesterolemia  Renal/GU negative Renal ROS  negative genitourinary   Musculoskeletal  (+) Arthritis , Osteoarthritis,  Rib pain- bony metastasis   Abdominal (+) + obese,   Peds  Hematology negative hematology ROS (+)   Anesthesia Other Findings   Reproductive/Obstetrics ED                          Anesthesia Physical Anesthesia Plan  ASA: III  Anesthesia Plan: General   Post-op Pain Management:    Induction: Intravenous  PONV Risk Score and Plan: 2 and Ondansetron, Treatment may vary due to age or medical condition and Dexamethasone  Airway Management Planned: Oral ETT  Additional Equipment:   Intra-op Plan:   Post-operative Plan: Extubation in OR  Informed Consent: I have reviewed the patients History and Physical, chart, labs and discussed the procedure including the risks, benefits and alternatives for the proposed anesthesia with the patient or authorized representative who has indicated his/her understanding and acceptance.     Dental advisory  given  Plan Discussed with: CRNA, Surgeon and Anesthesiologist  Anesthesia Plan Comments:        Anesthesia Quick Evaluation

## 2019-12-18 ENCOUNTER — Other Ambulatory Visit: Payer: Self-pay

## 2019-12-18 ENCOUNTER — Encounter (HOSPITAL_COMMUNITY): Payer: Self-pay | Admitting: Pulmonary Disease

## 2019-12-18 ENCOUNTER — Ambulatory Visit: Payer: 59

## 2019-12-18 ENCOUNTER — Ambulatory Visit (HOSPITAL_COMMUNITY): Payer: 59 | Admitting: Anesthesiology

## 2019-12-18 ENCOUNTER — Encounter (HOSPITAL_COMMUNITY)
Admission: RE | Disposition: A | Discharge status: Home / Self Care | Payer: Self-pay | Source: Home / Self Care | Attending: Pulmonary Disease

## 2019-12-18 ENCOUNTER — Ambulatory Visit (HOSPITAL_COMMUNITY)
Admission: RE | Admit: 2019-12-18 | Discharge: 2019-12-18 | Disposition: A | Discharge status: Home / Self Care | Payer: 59 | Attending: Pulmonary Disease | Admitting: Pulmonary Disease

## 2019-12-18 DIAGNOSIS — E669 Obesity, unspecified: Secondary | ICD-10-CM | POA: Diagnosis not present

## 2019-12-18 DIAGNOSIS — K760 Fatty (change of) liver, not elsewhere classified: Secondary | ICD-10-CM | POA: Diagnosis not present

## 2019-12-18 DIAGNOSIS — Z8249 Family history of ischemic heart disease and other diseases of the circulatory system: Secondary | ICD-10-CM | POA: Insufficient documentation

## 2019-12-18 DIAGNOSIS — Z6836 Body mass index (BMI) 36.0-36.9, adult: Secondary | ICD-10-CM | POA: Insufficient documentation

## 2019-12-18 DIAGNOSIS — M479 Spondylosis, unspecified: Secondary | ICD-10-CM | POA: Diagnosis not present

## 2019-12-18 DIAGNOSIS — E785 Hyperlipidemia, unspecified: Secondary | ICD-10-CM | POA: Insufficient documentation

## 2019-12-18 DIAGNOSIS — R590 Localized enlarged lymph nodes: Secondary | ICD-10-CM

## 2019-12-18 DIAGNOSIS — G4730 Sleep apnea, unspecified: Secondary | ICD-10-CM | POA: Diagnosis not present

## 2019-12-18 DIAGNOSIS — C3411 Malignant neoplasm of upper lobe, right bronchus or lung: Secondary | ICD-10-CM | POA: Diagnosis not present

## 2019-12-18 DIAGNOSIS — E7800 Pure hypercholesterolemia, unspecified: Secondary | ICD-10-CM | POA: Insufficient documentation

## 2019-12-18 DIAGNOSIS — R011 Cardiac murmur, unspecified: Secondary | ICD-10-CM | POA: Insufficient documentation

## 2019-12-18 DIAGNOSIS — Z720 Tobacco use: Secondary | ICD-10-CM

## 2019-12-18 DIAGNOSIS — R918 Other nonspecific abnormal finding of lung field: Secondary | ICD-10-CM | POA: Diagnosis present

## 2019-12-18 DIAGNOSIS — E118 Type 2 diabetes mellitus with unspecified complications: Secondary | ICD-10-CM | POA: Insufficient documentation

## 2019-12-18 DIAGNOSIS — F17200 Nicotine dependence, unspecified, uncomplicated: Secondary | ICD-10-CM | POA: Diagnosis not present

## 2019-12-18 DIAGNOSIS — M1990 Unspecified osteoarthritis, unspecified site: Secondary | ICD-10-CM | POA: Diagnosis not present

## 2019-12-18 DIAGNOSIS — K219 Gastro-esophageal reflux disease without esophagitis: Secondary | ICD-10-CM | POA: Diagnosis not present

## 2019-12-18 DIAGNOSIS — Z716 Tobacco abuse counseling: Secondary | ICD-10-CM

## 2019-12-18 DIAGNOSIS — C7951 Secondary malignant neoplasm of bone: Secondary | ICD-10-CM | POA: Diagnosis not present

## 2019-12-18 DIAGNOSIS — Z7984 Long term (current) use of oral hypoglycemic drugs: Secondary | ICD-10-CM | POA: Diagnosis not present

## 2019-12-18 DIAGNOSIS — C3490 Malignant neoplasm of unspecified part of unspecified bronchus or lung: Secondary | ICD-10-CM

## 2019-12-18 HISTORY — PX: BIOPSY: SHX5522

## 2019-12-18 HISTORY — PX: BRONCHIAL BRUSHINGS: SHX5108

## 2019-12-18 HISTORY — DX: Irritable bowel syndrome, unspecified: K58.9

## 2019-12-18 HISTORY — PX: ENDOBRONCHIAL ULTRASOUND: SHX5096

## 2019-12-18 HISTORY — PX: BRONCHIAL WASHINGS: SHX5105

## 2019-12-18 HISTORY — PX: VIDEO BRONCHOSCOPY WITH ENDOBRONCHIAL ULTRASOUND: SHX6177

## 2019-12-18 HISTORY — PX: FINE NEEDLE ASPIRATION: SHX6590

## 2019-12-18 LAB — COMPREHENSIVE METABOLIC PANEL
ALT: 21 U/L (ref 0–44)
AST: 18 U/L (ref 15–41)
Albumin: 3.3 g/dL — ABNORMAL LOW (ref 3.5–5.0)
Alkaline Phosphatase: 70 U/L (ref 38–126)
Anion gap: 9 (ref 5–15)
BUN: 9 mg/dL (ref 6–20)
CO2: 28 mmol/L (ref 22–32)
Calcium: 9.1 mg/dL (ref 8.9–10.3)
Chloride: 100 mmol/L (ref 98–111)
Creatinine, Ser: 0.83 mg/dL (ref 0.61–1.24)
GFR calc Af Amer: >60 mL/min (ref >60)
GFR calc non Af Amer: >60 mL/min (ref >60)
Glucose, Bld: 140 mg/dL — ABNORMAL HIGH (ref 70–99)
Potassium: 4.2 mmol/L (ref 3.5–5.1)
Sodium: 137 mmol/L (ref 135–145)
Total Bilirubin: 0.7 mg/dL (ref 0.3–1.2)
Total Protein: 6.7 g/dL (ref 6.5–8.1)

## 2019-12-18 LAB — CBC
HCT: 40.3 % (ref 39.0–52.0)
Hemoglobin: 12.7 g/dL — ABNORMAL LOW (ref 13.0–17.0)
MCH: 29.7 pg (ref 26.0–34.0)
MCHC: 31.5 g/dL (ref 30.0–36.0)
MCV: 94.4 fL (ref 80.0–100.0)
Platelets: 286 10*3/uL (ref 150–400)
RBC: 4.27 MIL/uL (ref 4.22–5.81)
RDW: 13.5 % (ref 11.5–15.5)
WBC: 10.6 10*3/uL — ABNORMAL HIGH (ref 4.0–10.5)
nRBC: 0 % (ref 0.0–0.2)

## 2019-12-18 LAB — PROTIME-INR
INR: 1 (ref 0.8–1.2)
Prothrombin Time: 13.1 seconds (ref 11.4–15.2)

## 2019-12-18 LAB — GLUCOSE, CAPILLARY: Glucose-Capillary: 143 mg/dL — ABNORMAL HIGH (ref 70–99)

## 2019-12-18 LAB — APTT: aPTT: 38 seconds — ABNORMAL HIGH (ref 24–36)

## 2019-12-18 SURGERY — BRONCHOSCOPY, WITH EBUS
Anesthesia: General | Laterality: Right

## 2019-12-18 MED ORDER — LACTATED RINGERS IV SOLN: INTRAVENOUS | Status: DC | PRN

## 2019-12-18 MED ORDER — FENTANYL CITRATE (PF) 100 MCG/2ML IJ SOLN
INTRAMUSCULAR | Status: DC | PRN
  Administered 2019-12-18: 100 ug via INTRAVENOUS

## 2019-12-18 MED ORDER — DEXAMETHASONE SODIUM PHOSPHATE 10 MG/ML IJ SOLN
INTRAMUSCULAR | Status: DC | PRN
  Administered 2019-12-18: 5 mg via INTRAVENOUS

## 2019-12-18 MED ORDER — HYDROCODONE-ACETAMINOPHEN 5-325 MG PO TABS: 1.0000 | ORAL_TABLET | Freq: Four times a day (QID) | ORAL | 0 refills | Status: DC | PRN

## 2019-12-18 MED ORDER — MIDAZOLAM HCL 5 MG/5ML IJ SOLN
INTRAMUSCULAR | Status: DC | PRN
  Administered 2019-12-18: 2 mg via INTRAVENOUS

## 2019-12-18 MED ORDER — ROCURONIUM BROMIDE 10 MG/ML (PF) SYRINGE
PREFILLED_SYRINGE | INTRAVENOUS | Status: DC | PRN
  Administered 2019-12-18: 60 mg via INTRAVENOUS
  Administered 2019-12-18: 20 mg via INTRAVENOUS

## 2019-12-18 MED ORDER — LIDOCAINE 2% (20 MG/ML) 5 ML SYRINGE
INTRAMUSCULAR | Status: DC | PRN
  Administered 2019-12-18: 40 mg via INTRAVENOUS
  Administered 2019-12-18: 60 mg via INTRAVENOUS

## 2019-12-18 MED ORDER — LIDOCAINE HCL (PF) 1 % IJ SOLN
INTRAMUSCULAR | Status: AC
  Filled 2019-12-18: qty 30

## 2019-12-18 MED ORDER — PROPOFOL 10 MG/ML IV BOLUS
INTRAVENOUS | Status: DC | PRN
  Administered 2019-12-18: 200 mg via INTRAVENOUS

## 2019-12-18 MED ORDER — SUGAMMADEX SODIUM 200 MG/2ML IV SOLN
INTRAVENOUS | Status: DC | PRN
  Administered 2019-12-18: 200 mg via INTRAVENOUS

## 2019-12-18 MED ORDER — ONDANSETRON HCL 4 MG/2ML IJ SOLN
INTRAMUSCULAR | Status: DC | PRN
  Administered 2019-12-18: 4 mg via INTRAVENOUS

## 2019-12-18 SURGICAL SUPPLY — 31 items
BRUSH CYTOL CELLEBRITY 1.5X140 (MISCELLANEOUS) IMPLANT
CANISTER SUCT 3000ML PPV (MISCELLANEOUS) ×4 IMPLANT
CONT SPEC 4OZ CLIKSEAL STRL BL (MISCELLANEOUS) ×4 IMPLANT
COVER BACK TABLE 60X90IN (DRAPES) ×4 IMPLANT
COVER DOME SNAP 22 D (MISCELLANEOUS) ×4 IMPLANT
FORCEPS BIOP RJ4 1.8 (CUTTING FORCEPS) IMPLANT
GAUZE SPONGE 4X4 12PLY STRL (GAUZE/BANDAGES/DRESSINGS) ×4 IMPLANT
GLOVE BIO SURGEON STRL SZ7.5 (GLOVE) ×4 IMPLANT
GOWN STRL REUS W/ TWL LRG LVL3 (GOWN DISPOSABLE) ×2 IMPLANT
GOWN STRL REUS W/TWL LRG LVL3 (GOWN DISPOSABLE) ×4
KIT CLEAN ENDO COMPLIANCE (KITS) ×8 IMPLANT
KIT TURNOVER KIT B (KITS) ×4 IMPLANT
MARKER SKIN DUAL TIP RULER LAB (MISCELLANEOUS) ×4 IMPLANT
NDL EBUS SONO TIP PENTAX (NEEDLE) ×2 IMPLANT
NEEDLE EBUS SONO TIP PENTAX (NEEDLE) ×4 IMPLANT
NS IRRIG 1000ML POUR BTL (IV SOLUTION) ×4 IMPLANT
OIL SILICONE PENTAX (PARTS (SERVICE/REPAIRS)) ×4 IMPLANT
PAD ARMBOARD 7.5X6 YLW CONV (MISCELLANEOUS) ×8 IMPLANT
SOL ANTI FOG 6CC (MISCELLANEOUS) ×2 IMPLANT
SOLUTION ANTI FOG 6CC (MISCELLANEOUS) ×2
SYR 20CC LL (SYRINGE) ×8 IMPLANT
SYR 20ML ECCENTRIC (SYRINGE) ×8 IMPLANT
SYR 50ML SLIP (SYRINGE) IMPLANT
SYR 5ML LUER SLIP (SYRINGE) ×4 IMPLANT
TOWEL OR 17X24 6PK STRL BLUE (TOWEL DISPOSABLE) ×4 IMPLANT
TRAP SPECIMEN MUCOUS 40CC (MISCELLANEOUS) IMPLANT
TUBE CONNECTING 20'X1/4 (TUBING) ×2
TUBE CONNECTING 20X1/4 (TUBING) ×6 IMPLANT
UNDERPAD 30X30 (UNDERPADS AND DIAPERS) ×4 IMPLANT
VALVE DISPOSABLE (MISCELLANEOUS) ×4 IMPLANT
WATER STERILE IRR 1000ML POUR (IV SOLUTION) ×4 IMPLANT

## 2019-12-18 NOTE — Transfer of Care (Signed)
Immediate Anesthesia Transfer of Care Note  Patient: Mike [name]  Procedure(s) Performed: VIDEO BRONCHOSCOPY WITH ENDOBRONCHIAL ULTRASOUND (Right ) FINE NEEDLE ASPIRATION  Patient Location: Endoscopy Unit  Anesthesia Type:General  Level of Consciousness: drowsy and patient cooperative  Airway & Oxygen Therapy: Patient Spontanous Breathing and Patient connected to nasal cannula oxygen  Post-op Assessment: Report given to RN and Post -op Vital signs reviewed and stable  Post vital signs: Reviewed and stable  Last Vitals:  Vitals Value Taken Time  BP    Temp    Pulse    Resp    SpO2      Last Pain:  Vitals:   12/18/19 0615  TempSrc: Oral  PainSc:       Patients Stated Pain Goal: 7 (81/38/87 1959)  Complications: No apparent anesthesia complications

## 2019-12-18 NOTE — Discharge Instructions (Signed)
Flexible Bronchoscopy, Care After This sheet gives you information about how to care for yourself after your test. Your doctor may also give you more specific instructions. If you have problems or questions, contact your doctor. Follow these instructions at home: Eating and drinking  The day after the test, go back to your normal diet. Driving  Do not drive for 24 hours if you were given a medicine to help you relax (sedative).  Do not drive or use heavy machinery while taking prescription pain medicine. General instructions   Take over-the-counter and prescription medicines only as told by your doctor.  Return to your normal activities as told. Ask what activities are safe for you.  Do not use any products that have nicotine or tobacco in them. This includes cigarettes and e-cigarettes. If you need help quitting, ask your doctor.  Keep all follow-up visits as told by your doctor. This is important. It is very important if you had a tissue sample (biopsy) taken. Get help right away if:  You have shortness of breath that gets worse.  You get light-headed.  You feel like you are going to pass out (faint).  You have chest pain.  You cough up: ? More than a little blood. ? More blood than before. Summary  Do not eat or drink anything (not even water) for 2 hours after your test, or until your numbing medicine wears off.  Do not use cigarettes. Do not use e-cigarettes.  Get help right away if you have chest pain. This information is not intended to replace advice given to you by your health care provider. Make sure you discuss any questions you have with your health care provider. Document Revised: 09/16/2017 Document Reviewed: 10/22/2016 Elsevier Patient Education  2020 [employer].

## 2019-12-18 NOTE — Anesthesia Procedure Notes (Signed)
Procedure Name: Intubation Date/Time: 12/18/2019 7:24 AM Performed by: Moshe Salisbury, CRNA Pre-anesthesia Checklist: Patient identified, Emergency Drugs available, Suction available and Patient being monitored Patient Re-evaluated:Patient Re-evaluated prior to induction Oxygen Delivery Method: Circle System Utilized Preoxygenation: Pre-oxygenation with 100% oxygen Induction Type: IV induction Ventilation: Mask ventilation without difficulty Laryngoscope Size: Mac and 4 Grade View: Grade II Tube type: Oral Tube size: 9.0 mm Number of attempts: 1 Placement Confirmation: ETT inserted through vocal cords under direct vision,  positive ETCO2 and breath sounds checked- equal and bilateral Secured at: 22 cm Tube secured with: Tape Dental Injury: Teeth and Oropharynx as per pre-operative assessment

## 2019-12-18 NOTE — Telephone Encounter (Signed)
ERx 

## 2019-12-18 NOTE — Op Note (Signed)
Video Bronchoscopy with Endobronchial Ultrasound Procedure Note  Date of Operation: 12/18/2019  Pre-op Diagnosis: RUL lung mass   Post-op Diagnosis: RUL lung mass   Surgeon: Garner Nash, DO   Assistants: None   Anesthesia: General endotracheal anesthesia  Operation: Flexible video fiberoptic bronchoscopy with endobronchial ultrasound and biopsies.  Estimated Blood Loss: Minimal, <5KT  Complications: None   Indications and History: Mike [name] is a 54 y.o. male with right upper lobe lung mass, mediastinal adenopathy, abnormal CT imaging.  The risks, benefits, complications, treatment options and expected outcomes were discussed with the patient.  The possibilities of pneumothorax, pneumonia, reaction to medication, pulmonary aspiration, perforation of a viscus, bleeding, failure to diagnose a condition and creating a complication requiring transfusion or operation were discussed with the patient who freely signed the consent.    Description of Procedure: The patient was examined in the preoperative area and history and data from the preprocedure consultation were reviewed. It was deemed appropriate to proceed.  The patient was taken to [facility] endoscopy room 2, identified as [name] and the procedure verified as Flexible Video Fiberoptic Bronchoscopy.  A Time Out was held and the above information confirmed. After being taken to the operating room general anesthesia was initiated and the patient  was orally intubated. The video fiberoptic bronchoscope was introduced via the endotracheal tube and a general inspection was performed which showed normal left lung anatomy, right lung anatomy reveals right upper lobe anterior segment with extrinsic compression of tumor small amounts of tumor tracking along the medial surface of the mucosa.  Nearly 80% occluded anterior segment of the right upper lobe.  The standard scope was then withdrawn and the endobronchial ultrasound was used  to identify and characterize the peritracheal, hilar and bronchial lymph nodes. Inspection showed enlarged 4R, right hilum and 7. Using real-time ultrasound guidance Wang needle biopsies were take from Station 4R nodes and were sent for cytology.  Following conclusion of endobronchial ultrasound we switched scopes to a standard flexible bronchoscope.  Forcep biopsies of the right upper lobe endobronchial tumor were completed.  Cytology brushings to the right upper lobe endobronchial tumor were completed followed by BAL to the right upper lobe all sent for cytology.  We used therapeutic bronchoscope for suctioning and clearance of all blood clots and secretions from the airway within bilateral mainstem's. The patient tolerated the procedure well without apparent complications. There was no significant blood loss.  There was no evidence of active bleeding scope was brought to just above the main carina. The bronchoscope was withdrawn. Anesthesia was reversed and the patient was taken to the Endo PACU for recovery.   Samples: 1. Wang needle biopsies from station 4R node 2. Endobronchial forcep biopsies of the right upper lobe 3.  Brushings right upper lobe anterior segment 4.  BAL right upper lobe  Plans:  The patient will be discharged from the PACU to home when recovered from anesthesia. We will review the cytology, pathology and microbiology results with the patient when they become available. Outpatient followup will be with Garner Nash, DO.    Garner Nash, DO  Pulmonary Critical Care 12/18/2019 8:40 AM

## 2019-12-18 NOTE — H&P (View-Only) (Signed)
Synopsis: Referred in March 2021 for abnormal CT chest by No ref. provider found  Subjective:   PATIENT ID: Mike [name] GENDER: male DOB: June 12, 1966, MRN: [id number]  This is a 54 year old gentleman longstanding history of tobacco abuse, started smoking nearly 40 years ago at his max was 2 packs/day.  He has obstructive sleep apnea, severe obesity, hyperlipidemia.  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  Patient underwent CT scan imaging on 12/12/2019.  The CT image revealed a 4.6 x 3.4 irregular right upper lobe mass with associated paratracheal lymphadenopathy concerning for advanced stage bronchogenic carcinoma as well as a destructive right eighth rib lesion.  Patient denies hemoptysis or weight loss.   Past Medical History:  Diagnosis Date  . Arthritis    neck  . Diabetes ([place])   . Fatty liver 12/05/2019   By [place] 11/2019  . GERD (gastroesophageal reflux disease)   . 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 High cholesterol   . IBS (irritable bowel syndrome)   . OSA (obstructive sleep apnea) 09/15/2019   Sleep study 12/2017 - severe OSA with AHI 65.6, desat to 76% rec CPAP  . Severe obesity (BMI 35.0-39.9) with comorbidity ([place]) 09/04/2019     Family History  Problem Relation Age of Onset  . CAD Mother        stents  . Stroke Neg Hx   . Diabetes Neg Hx   . Cancer Neg Hx   . Colon cancer Neg Hx   . Esophageal cancer Neg Hx   . Stomach cancer Neg Hx   . Rectal cancer Neg Hx      Past Surgical History:  Procedure Laterality Date  . COLONOSCOPY    . CYSTECTOMY     knee- left knee cyst  . PILONIDAL CYST EXCISION      Social History   Socioeconomic History  . Marital status: Divorced    Spouse name: Not on file  . Number of children: Not on file  . Years of education: Not on file  . Highest education level: Not on file  Occupational History  . Not on file  Tobacco Use  . Smoking status: Current Every Day Smoker    Packs/day: 1.00    Years:  40.00    Pack years: 40.00  . Smokeless tobacco: Never Used  Substance and Sexual Activity  . Alcohol use: Yes    Comment: Rarely  . Drug use: Never  . Sexual activity: Yes    Partners: Female  Other Topics Concern  . Not on file  Social History Narrative   Lives with brother Claiborne Billings) and fiancee Mamie Nick)   Occ: Civil engineer, contracting    Edu: HS, self taught [employer]   Activity:    Diet:    Social Determinants of Health   Financial Resource Strain:   . Difficulty of Paying Living Expenses: Not on file  Food Insecurity:   . Worried About Charity fundraiser in the Last Year: Not on file  . Ran Out of Food in the Last Year: Not on file  Transportation Needs:   . Lack of Transportation (Medical): Not on file  . Lack of Transportation (Non-Medical): Not on file  Physical Activity:   . Days of Exercise per Week: Not on file  . Minutes of Exercise per Session: Not on file  Stress:   . Feeling of Stress : Not on file  Social Connections:   . Frequency of  Communication with Friends and Family: Not on file  . Frequency of Social Gatherings with Friends and Family: Not on file  . Attends Religious Services: Not on file  . Active Member of Clubs or Organizations: Not on file  . Attends Archivist Meetings: Not on file  . Marital Status: Not on file  Intimate Partner Violence:   . Fear of Current or Ex-Partner: Not on file  . Emotionally Abused: Not on file  . Physically Abused: Not on file  . Sexually Abused: Not on file     No Known Allergies   @ENCMEDSTART @  Review of Systems  Constitutional: Negative for chills, fever, malaise/fatigue and weight loss.  HENT: Negative for hearing loss, sore throat and tinnitus.   Eyes: Negative for blurred vision and double vision.  Respiratory: Positive for cough and shortness of breath. Negative for hemoptysis, sputum production, wheezing and stridor.   Cardiovascular: Negative for chest pain, palpitations,  orthopnea, leg swelling and PND.  Gastrointestinal: Negative for abdominal pain, constipation, diarrhea, heartburn, nausea and vomiting.  Genitourinary: Negative for dysuria, hematuria and urgency.  Musculoskeletal: Negative for joint pain and myalgias.  Skin: Negative for itching and rash.  Neurological: Negative for dizziness, tingling, weakness and headaches.  Endo/Heme/Allergies: Negative for environmental allergies. Does not bruise/bleed easily.  Psychiatric/Behavioral: Negative for depression. The patient is not nervous/anxious and does not have insomnia.   All other systems reviewed and are negative.    Objective:  Physical Exam Vitals reviewed.  Constitutional:      General: He is not in acute distress.    Appearance: He is well-developed. He is obese.  HENT:     Head: Normocephalic and atraumatic.  Eyes:     General: No scleral icterus.    Conjunctiva/sclera: Conjunctivae normal.     Pupils: Pupils are equal, round, and reactive to light.  Neck:     Vascular: No JVD.     Trachea: No tracheal deviation.  Cardiovascular:     Rate and Rhythm: Normal rate and regular rhythm.     Heart sounds: Normal heart sounds. No murmur.  Pulmonary:     Effort: Pulmonary effort is normal. No tachypnea, accessory muscle usage or respiratory distress.     Breath sounds: Normal breath sounds. No stridor. No wheezing, rhonchi or rales.  Abdominal:     Palpations: Abdomen is soft.  Musculoskeletal:        General: No tenderness.     Cervical back: Neck supple.  Lymphadenopathy:     Cervical: No cervical adenopathy.  Skin:    General: Skin is warm and dry.     Capillary Refill: Capillary refill takes less than 2 seconds.     Findings: No rash.  Neurological:     Mental Status: He is alert and oriented to person, place, and time.  Psychiatric:        Behavior: Behavior normal.      Vitals:   12/18/19 0615  BP: 136/78  Pulse: 83  Resp: 18  Temp: 98.5 F (36.9 C)  TempSrc:  Oral  SpO2: 95%  Weight: 119.7 kg  Height: 5' 11.75" (1.822 m)   95% on RA BMI Readings from Last 3 Encounters:  12/18/19 36.05 kg/m  12/12/19 36.12 kg/m  11/27/19 36.48 kg/m   Wt Readings from Last 3 Encounters:  12/18/19 119.7 kg  12/12/19 120 kg  11/27/19 122 kg     CBC    Component Value Date/Time   WBC 10.9 (H) 09/04/2019 8250  RBC 4.61 09/04/2019 0955   HGB 14.2 09/04/2019 0955   HCT 43.6 09/04/2019 0955   PLT 249.0 09/04/2019 0955   MCV 94.6 09/04/2019 0955   MCHC 32.7 09/04/2019 0955   RDW 13.5 09/04/2019 0955   LYMPHSABS 2.6 09/04/2019 0955   MONOABS 0.9 09/04/2019 0955   EOSABS 0.1 09/04/2019 0955   BASOSABS 0.1 09/04/2019 0955    Chest Imaging: 12/12/2019 CT chest: 4 cm right upper lobe mass, associated para hilar and mediastinal adenopathy large peritracheal node, lesion within the destructive right rib. The patient's images have been independently reviewed by me.       Assessment & Plan:   Right upper lobe lung mass Mediastinal adenopathy Bony rib metastasis, eighth rib Longstanding history of tobacco abuse, greater than 40-pack-year history Image findings concerning for an advanced stage bronchogenic carcinoma  Discussion: Plans for video bronchoscopy with endobronchial ultrasound and transbronchial needle aspiration biopsies to the patient's mediastinum. In addition the right upper lobe appears occluded and have discussed risk benefits and alternatives of tumor debulking and cryotherapy to the right upper lobe for any evidence of endobronchial disease. Patient will also need tissue diagnosis for molecular studies. Today we discussed the risk benefits and alternatives of proceeding with bronchoscopy. Patient has no barriers and no concerns.  Consent signed in preop.   [facility] 12/18/2019 6:56 AM

## 2019-12-18 NOTE — Anesthesia Postprocedure Evaluation (Signed)
Anesthesia Post Note  Patient: Mike [name]  Procedure(s) Performed: VIDEO BRONCHOSCOPY WITH ENDOBRONCHIAL ULTRASOUND (Right ) FINE NEEDLE ASPIRATION     Patient location during evaluation: PACU Anesthesia Type: General Level of consciousness: awake and alert and oriented Pain management: pain level controlled Vital Signs Assessment: post-procedure vital signs reviewed and stable Respiratory status: spontaneous breathing, nonlabored ventilation and respiratory function stable Cardiovascular status: blood pressure returned to baseline and stable Postop Assessment: no apparent nausea or vomiting Anesthetic complications: no    Last Vitals:  Vitals:   12/18/19 0900 12/18/19 0910  BP: 124/65 124/65  Pulse: 91 89  Resp: (!) 22 17  Temp:    SpO2: 98% 92%    Last Pain:  Vitals:   12/18/19 0900  TempSrc:   PainSc: 6                  Adoria Kawamoto A.

## 2019-12-18 NOTE — Interval H&P Note (Signed)
History and Physical Interval Note:  12/18/2019 7:18 AM  Mike [name]  has presented today for surgery, with the diagnosis of LUNG MASS RIGHT UPPER LOBE.  The various methods of treatment have been discussed with the patient and family. After consideration of risks, benefits and other options for treatment, the patient has consented to  Procedure(s): [place] (Right) as a surgical intervention.  The patient's history has been reviewed, patient examined, no change in status, stable for surgery.  I have reviewed the patient's chart and labs.  Questions were answered to the patient's satisfaction.    No barriers to proceed. Discussed risks benefits and alternatives with the patient.   Page

## 2019-12-18 NOTE — Consult Note (Signed)
Synopsis: Referred in March 2021 for abnormal CT chest by No ref. provider found  Subjective:   PATIENT ID: Mike [name] GENDER: male DOB: [birth year], MRN: [id number]  This is a 54 year old gentleman longstanding history of tobacco abuse, started smoking nearly 40 years ago at his max was 2 packs/day.  He has obstructive sleep apnea, severe obesity, hyperlipidemia.  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  Patient underwent CT scan imaging on 12/12/2019.  The CT image revealed a 4.6 x 3.4 irregular right upper lobe mass with associated paratracheal lymphadenopathy concerning for advanced stage bronchogenic carcinoma as well as a destructive right eighth rib lesion.  Patient denies hemoptysis or weight loss.   Past Medical History:  Diagnosis Date  . Arthritis    neck  . Diabetes ([place])   . Fatty liver 12/05/2019   By [place] 11/2019  . GERD (gastroesophageal reflux disease)   . 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 High cholesterol   . IBS (irritable bowel syndrome)   . OSA (obstructive sleep apnea) 09/15/2019   Sleep study 12/2017 - severe OSA with AHI 65.6, desat to 76% rec CPAP  . Severe obesity (BMI 35.0-39.9) with comorbidity ([place]) 09/04/2019     Family History  Problem Relation Age of Onset  . CAD Mother        stents  . Stroke Neg Hx   . Diabetes Neg Hx   . Cancer Neg Hx   . Colon cancer Neg Hx   . Esophageal cancer Neg Hx   . Stomach cancer Neg Hx   . Rectal cancer Neg Hx      Past Surgical History:  Procedure Laterality Date  . COLONOSCOPY    . CYSTECTOMY     knee- left knee cyst  . PILONIDAL CYST EXCISION      Social History   Socioeconomic History  . Marital status: Divorced    Spouse name: Not on file  . Number of children: Not on file  . Years of education: Not on file  . Highest education level: Not on file  Occupational History  . Not on file  Tobacco Use  . Smoking status: Current Every Day Smoker    Packs/day: 1.00    Years:  40.00    Pack years: 40.00  . Smokeless tobacco: Never Used  Substance and Sexual Activity  . Alcohol use: Yes    Comment: Rarely  . Drug use: Never  . Sexual activity: Yes    Partners: Female  Other Topics Concern  . Not on file  Social History Narrative   Lives with brother Claiborne Billings) and fiancee Mamie Nick)   Occ: Civil engineer, contracting    Edu: HS, self taught [employer]   Activity:    Diet:    Social Determinants of Health   Financial Resource Strain:   . Difficulty of Paying Living Expenses: Not on file  Food Insecurity:   . Worried About Charity fundraiser in the Last Year: Not on file  . Ran Out of Food in the Last Year: Not on file  Transportation Needs:   . Lack of Transportation (Medical): Not on file  . Lack of Transportation (Non-Medical): Not on file  Physical Activity:   . Days of Exercise per Week: Not on file  . Minutes of Exercise per Session: Not on file  Stress:   . Feeling of Stress : Not on file  Social Connections:   . Frequency of  Communication with Friends and Family: Not on file  . Frequency of Social Gatherings with Friends and Family: Not on file  . Attends Religious Services: Not on file  . Active Member of Clubs or Organizations: Not on file  . Attends Archivist Meetings: Not on file  . Marital Status: Not on file  Intimate Partner Violence:   . Fear of Current or Ex-Partner: Not on file  . Emotionally Abused: Not on file  . Physically Abused: Not on file  . Sexually Abused: Not on file     No Known Allergies   @ENCMEDSTART @  Review of Systems  Constitutional: Negative for chills, fever, malaise/fatigue and weight loss.  HENT: Negative for hearing loss, sore throat and tinnitus.   Eyes: Negative for blurred vision and double vision.  Respiratory: Positive for cough and shortness of breath. Negative for hemoptysis, sputum production, wheezing and stridor.   Cardiovascular: Negative for chest pain, palpitations,  orthopnea, leg swelling and PND.  Gastrointestinal: Negative for abdominal pain, constipation, diarrhea, heartburn, nausea and vomiting.  Genitourinary: Negative for dysuria, hematuria and urgency.  Musculoskeletal: Negative for joint pain and myalgias.  Skin: Negative for itching and rash.  Neurological: Negative for dizziness, tingling, weakness and headaches.  Endo/Heme/Allergies: Negative for environmental allergies. Does not bruise/bleed easily.  Psychiatric/Behavioral: Negative for depression. The patient is not nervous/anxious and does not have insomnia.   All other systems reviewed and are negative.    Objective:  Physical Exam Vitals reviewed.  Constitutional:      General: He is not in acute distress.    Appearance: He is well-developed. He is obese.  HENT:     Head: Normocephalic and atraumatic.  Eyes:     General: No scleral icterus.    Conjunctiva/sclera: Conjunctivae normal.     Pupils: Pupils are equal, round, and reactive to light.  Neck:     Vascular: No JVD.     Trachea: No tracheal deviation.  Cardiovascular:     Rate and Rhythm: Normal rate and regular rhythm.     Heart sounds: Normal heart sounds. No murmur.  Pulmonary:     Effort: Pulmonary effort is normal. No tachypnea, accessory muscle usage or respiratory distress.     Breath sounds: Normal breath sounds. No stridor. No wheezing, rhonchi or rales.  Abdominal:     Palpations: Abdomen is soft.  Musculoskeletal:        General: No tenderness.     Cervical back: Neck supple.  Lymphadenopathy:     Cervical: No cervical adenopathy.  Skin:    General: Skin is warm and dry.     Capillary Refill: Capillary refill takes less than 2 seconds.     Findings: No rash.  Neurological:     Mental Status: He is alert and oriented to person, place, and time.  Psychiatric:        Behavior: Behavior normal.      Vitals:   12/18/19 0615  BP: 136/78  Pulse: 83  Resp: 18  Temp: 98.5 F (36.9 C)  TempSrc:  Oral  SpO2: 95%  Weight: 119.7 kg  Height: 5' 11.75" (1.822 m)   95% on RA BMI Readings from Last 3 Encounters:  12/18/19 36.05 kg/m  12/12/19 36.12 kg/m  11/27/19 36.48 kg/m   Wt Readings from Last 3 Encounters:  12/18/19 119.7 kg  12/12/19 120 kg  11/27/19 122 kg     CBC    Component Value Date/Time   WBC 10.9 (H) 09/04/2019 9470  RBC 4.61 09/04/2019 0955   HGB 14.2 09/04/2019 0955   HCT 43.6 09/04/2019 0955   PLT 249.0 09/04/2019 0955   MCV 94.6 09/04/2019 0955   MCHC 32.7 09/04/2019 0955   RDW 13.5 09/04/2019 0955   LYMPHSABS 2.6 09/04/2019 0955   MONOABS 0.9 09/04/2019 0955   EOSABS 0.1 09/04/2019 0955   BASOSABS 0.1 09/04/2019 0955    Chest Imaging: 12/12/2019 CT chest: 4 cm right upper lobe mass, associated para hilar and mediastinal adenopathy large peritracheal node, lesion within the destructive right rib. The patient's images have been independently reviewed by me.       Assessment & Plan:   Right upper lobe lung mass Mediastinal adenopathy Bony rib metastasis, eighth rib Longstanding history of tobacco abuse, greater than 40-pack-year history Image findings concerning for an advanced stage bronchogenic carcinoma  Discussion: Plans for video bronchoscopy with endobronchial ultrasound and transbronchial needle aspiration biopsies to the patient's mediastinum. In addition the right upper lobe appears occluded and have discussed risk benefits and alternatives of tumor debulking and cryotherapy to the right upper lobe for any evidence of endobronchial disease. Patient will also need tissue diagnosis for molecular studies. Today we discussed the risk benefits and alternatives of proceeding with bronchoscopy. Patient has no barriers and no concerns.  Consent signed in preop.   [facility] 12/18/2019 6:56 AM

## 2019-12-19 LAB — CYTOLOGY - NON PAP

## 2019-12-20 ENCOUNTER — Encounter
Admission: RE | Admit: 2019-12-20 | Discharge: 2019-12-20 | Disposition: A | Discharge status: Home / Self Care | Payer: 59 | Source: Ambulatory Visit | Attending: Family Medicine | Admitting: Family Medicine

## 2019-12-20 ENCOUNTER — Other Ambulatory Visit: Payer: Self-pay

## 2019-12-20 ENCOUNTER — Ambulatory Visit
Admission: RE | Admit: 2019-12-20 | Discharge: 2019-12-20 | Disposition: A | Discharge status: Home / Self Care | Payer: 59 | Source: Ambulatory Visit | Attending: Pulmonary Disease | Admitting: Pulmonary Disease

## 2019-12-20 ENCOUNTER — Encounter: Payer: Self-pay | Admitting: Family Medicine

## 2019-12-20 DIAGNOSIS — R918 Other nonspecific abnormal finding of lung field: Secondary | ICD-10-CM | POA: Insufficient documentation

## 2019-12-20 DIAGNOSIS — C7951 Secondary malignant neoplasm of bone: Secondary | ICD-10-CM | POA: Insufficient documentation

## 2019-12-20 DIAGNOSIS — C3490 Malignant neoplasm of unspecified part of unspecified bronchus or lung: Secondary | ICD-10-CM | POA: Diagnosis present

## 2019-12-20 LAB — GLUCOSE, CAPILLARY: Glucose-Capillary: 128 mg/dL — ABNORMAL HIGH (ref 70–99)

## 2019-12-20 LAB — SURGICAL PATHOLOGY

## 2019-12-20 MED ORDER — GADOBUTROL 1 MMOL/ML IV SOLN
10.0000 mL | Freq: Once | INTRAVENOUS | Status: AC | PRN
  Administered 2019-12-20: 10 mL via INTRAVENOUS

## 2019-12-20 MED ORDER — FLUDEOXYGLUCOSE F - 18 (FDG) INJECTION
13.7000 | Freq: Once | INTRAVENOUS | Status: AC | PRN
  Administered 2019-12-20: 13.76 via INTRAVENOUS

## 2019-12-24 ENCOUNTER — Encounter: Payer: Self-pay | Admitting: Family Medicine

## 2019-12-24 ENCOUNTER — Inpatient Hospital Stay: Payer: 59 | Attending: Internal Medicine

## 2019-12-24 ENCOUNTER — Inpatient Hospital Stay: Admission: RE | Admit: 2019-12-24 | Payer: 59 | Source: Ambulatory Visit

## 2019-12-24 ENCOUNTER — Inpatient Hospital Stay (HOSPITAL_BASED_OUTPATIENT_CLINIC_OR_DEPARTMENT_OTHER): Payer: 59 | Admitting: Internal Medicine

## 2019-12-24 ENCOUNTER — Encounter: Payer: Self-pay | Admitting: Internal Medicine

## 2019-12-24 ENCOUNTER — Encounter: Payer: Self-pay | Admitting: *Deleted

## 2019-12-24 ENCOUNTER — Other Ambulatory Visit: Payer: 59

## 2019-12-24 ENCOUNTER — Other Ambulatory Visit: Payer: Self-pay

## 2019-12-24 ENCOUNTER — Encounter: Payer: 59 | Admitting: Acute Care

## 2019-12-24 ENCOUNTER — Other Ambulatory Visit: Payer: Self-pay | Admitting: Family Medicine

## 2019-12-24 VITALS — BP 140/76 | HR 85 | Temp 97.8°F | Resp 17 | Ht 71.75 in | Wt 271.9 lb

## 2019-12-24 DIAGNOSIS — Z7982 Long term (current) use of aspirin: Secondary | ICD-10-CM | POA: Diagnosis not present

## 2019-12-24 DIAGNOSIS — C7951 Secondary malignant neoplasm of bone: Secondary | ICD-10-CM | POA: Insufficient documentation

## 2019-12-24 DIAGNOSIS — E119 Type 2 diabetes mellitus without complications: Secondary | ICD-10-CM | POA: Diagnosis not present

## 2019-12-24 DIAGNOSIS — Z7984 Long term (current) use of oral hypoglycemic drugs: Secondary | ICD-10-CM | POA: Diagnosis not present

## 2019-12-24 DIAGNOSIS — C3491 Malignant neoplasm of unspecified part of right bronchus or lung: Secondary | ICD-10-CM | POA: Insufficient documentation

## 2019-12-24 DIAGNOSIS — G893 Neoplasm related pain (acute) (chronic): Secondary | ICD-10-CM | POA: Diagnosis not present

## 2019-12-24 DIAGNOSIS — Z79899 Other long term (current) drug therapy: Secondary | ICD-10-CM | POA: Diagnosis not present

## 2019-12-24 DIAGNOSIS — Z5112 Encounter for antineoplastic immunotherapy: Secondary | ICD-10-CM

## 2019-12-24 DIAGNOSIS — R5383 Other fatigue: Secondary | ICD-10-CM

## 2019-12-24 DIAGNOSIS — K219 Gastro-esophageal reflux disease without esophagitis: Secondary | ICD-10-CM | POA: Diagnosis not present

## 2019-12-24 DIAGNOSIS — R0781 Pleurodynia: Secondary | ICD-10-CM | POA: Diagnosis not present

## 2019-12-24 DIAGNOSIS — Z5111 Encounter for antineoplastic chemotherapy: Secondary | ICD-10-CM | POA: Diagnosis not present

## 2019-12-24 DIAGNOSIS — R918 Other nonspecific abnormal finding of lung field: Secondary | ICD-10-CM

## 2019-12-24 DIAGNOSIS — F17210 Nicotine dependence, cigarettes, uncomplicated: Secondary | ICD-10-CM | POA: Insufficient documentation

## 2019-12-24 DIAGNOSIS — Z7189 Other specified counseling: Secondary | ICD-10-CM | POA: Diagnosis not present

## 2019-12-24 DIAGNOSIS — R05 Cough: Secondary | ICD-10-CM | POA: Diagnosis not present

## 2019-12-24 DIAGNOSIS — E785 Hyperlipidemia, unspecified: Secondary | ICD-10-CM | POA: Insufficient documentation

## 2019-12-24 DIAGNOSIS — R0609 Other forms of dyspnea: Secondary | ICD-10-CM | POA: Insufficient documentation

## 2019-12-24 DIAGNOSIS — G4733 Obstructive sleep apnea (adult) (pediatric): Secondary | ICD-10-CM | POA: Diagnosis not present

## 2019-12-24 DIAGNOSIS — M1990 Unspecified osteoarthritis, unspecified site: Secondary | ICD-10-CM | POA: Insufficient documentation

## 2019-12-24 LAB — CBC WITH DIFFERENTIAL (CANCER CENTER ONLY)
Abs Immature Granulocytes: 0.06 10*3/uL (ref 0.00–0.07)
Basophils Absolute: 0.1 10*3/uL (ref 0.0–0.1)
Basophils Relative: 1 %
Eosinophils Absolute: 0.2 10*3/uL (ref 0.0–0.5)
Eosinophils Relative: 1 %
HCT: 40.5 % (ref 39.0–52.0)
Hemoglobin: 13.1 g/dL (ref 13.0–17.0)
Immature Granulocytes: 1 %
Lymphocytes Relative: 25 %
Lymphs Abs: 3.1 10*3/uL (ref 0.7–4.0)
MCH: 29.8 pg (ref 26.0–34.0)
MCHC: 32.3 g/dL (ref 30.0–36.0)
MCV: 92 fL (ref 80.0–100.0)
Monocytes Absolute: 0.8 10*3/uL (ref 0.1–1.0)
Monocytes Relative: 6 %
Neutro Abs: 8.3 10*3/uL — ABNORMAL HIGH (ref 1.7–7.7)
Neutrophils Relative %: 66 %
Platelet Count: 341 10*3/uL (ref 150–400)
RBC: 4.4 MIL/uL (ref 4.22–5.81)
RDW: 13.4 % (ref 11.5–15.5)
WBC Count: 12.4 10*3/uL — ABNORMAL HIGH (ref 4.0–10.5)
nRBC: 0 % (ref 0.0–0.2)

## 2019-12-24 LAB — CMP (CANCER CENTER ONLY)
ALT: 18 U/L (ref 0–44)
AST: 15 U/L (ref 15–41)
Albumin: 3.4 g/dL — ABNORMAL LOW (ref 3.5–5.0)
Alkaline Phosphatase: 96 U/L (ref 38–126)
Anion gap: 8 (ref 5–15)
BUN: 8 mg/dL (ref 6–20)
CO2: 31 mmol/L (ref 22–32)
Calcium: 9.4 mg/dL (ref 8.9–10.3)
Chloride: 99 mmol/L (ref 98–111)
Creatinine: 0.84 mg/dL (ref 0.61–1.24)
GFR, Est AFR Am: >60 mL/min (ref >60)
GFR, Estimated: >60 mL/min (ref >60)
Glucose, Bld: 147 mg/dL — ABNORMAL HIGH (ref 70–99)
Potassium: 4.1 mmol/L (ref 3.5–5.1)
Sodium: 138 mmol/L (ref 135–145)
Total Bilirubin: 0.3 mg/dL (ref 0.3–1.2)
Total Protein: 7.4 g/dL (ref 6.5–8.1)

## 2019-12-24 MED ORDER — HYDROCODONE-ACETAMINOPHEN 5-325 MG PO TABS: 1.0000 | ORAL_TABLET | Freq: Four times a day (QID) | ORAL | 0 refills | Status: DC | PRN

## 2019-12-24 MED ORDER — LIDOCAINE-PRILOCAINE 2.5-2.5 % EX CREA: TOPICAL_CREAM | CUTANEOUS | 0 refills | Status: DC

## 2019-12-24 MED ORDER — PROCHLORPERAZINE 25 MG RE SUPP: 25.0000 mg | Freq: Two times a day (BID) | RECTAL | 0 refills | Status: DC | PRN

## 2019-12-24 NOTE — Telephone Encounter (Signed)
Name of Medication: Hydrocodone-APAP Name of Pharmacy: [facility] Last Fill or Written Date and Quantity: 12/18/19, #30 Last Office Visit and Type: 12/12/19, rib pain Next Office Visit and Type: 02/22/20, 3 mo DM f/u Last Controlled Substance Agreement Date: none Last UDS: none

## 2019-12-24 NOTE — Progress Notes (Signed)
[place] [phone]   [phone]  CONSULT NOTE  REFERRING PHYSICIAN: Dr. Leory Plowman Icard  REASON FOR CONSULTATION:  54 years old white male recently diagnosed with lung cancer.  HPI CORIAN HANDLEY is a 54 y.o. male with past medical history significant for diabetes mellitus, GERD, osteoarthritis, irritable bowel syndrome, dyslipidemia as well as obstructive sleep apnea and long history of smoking.  The patient presented to his primary care physician complaining of right-sided chest wall pain as well as right hip pain.  During his examination his primary care provider noticed wheezing on his exam.  He ordered chest x-ray on 12/12/2019 and that showed focal right upper lobe opacity measuring 3.6 x 3.6 x 6.9 cm suspicious for mass versus infiltrate.  This was followed by CT scan of the chest on the same day and that showed 4.6 x 3.4 cm irregular right upper lobe mass consistent with primary malignancy.  There was also enlarged right paratracheal and right hilar lymph nodes concerning for nodal metastasis.  The scan also showed expansile destructive lesion involving the lateral portion of the right eighth rib consistent with metastatic disease.  The patient was referred to Dr. Valeta Harms and on December 18, 2019 he underwent bronchoscopy with endobronchial ultrasound and biopsies.  The final pathology ([id number]) was consistent with non-small cell carcinoma. Immunohistochemistry shows the tumor is positive with p40 and  cytokeratin 5/6 and negative with TTF-1. The immunophenotype is  consistent with squamous cell carcinoma.  The patient also had a PET scan on December 20, 2019 and that showed hypermetabolic mass in the central right upper lobe extending to the hilum and measuring 4.9 x 2.9 cm with SUV max of 17.2.  There was also hypermetabolic right lower paratracheal lymph node measuring 1.8 cm with SUV max of 62.2 and hypermetabolic subcarinal lymph node.  There was also  hypermetabolic right supraclavicular nodal metastasis.  The scan also showed hypermetabolic skeletal metastasis involving sternal lesion, right eighth rib as well as the right posterior acetabular lytic lesion.  MRI of the brain performed on the same day was negative for metastatic disease to the brain. Dr. Valeta Harms kindly referred the patient to me today for further evaluation and recommendation regarding treatment of his condition.  He is currently on hydrocodone for his pain management.  The patient continues to have the right rib pain worse with cough and bowel movement.  He also has right hip pain with radiation to the leg.  He complains of chest congestion and wheezing as well as cough productive of whitish sputum but no significant hemoptysis.  He has shortness of breath with exertion.  He denied having any recent weight loss or night sweats.  He has no nausea, vomiting, diarrhea or abdominal pain.  He has no headache or visual changes. Family history significant for mother died from heart disease, father had diabetes mellitus and defibrillator placement. The patient is single and has 1 son age 59.  Used to do electric work.  He was accompanied today by his fiance Lattie Haw.  The patient has a history of smoking 1 pack/day for around 46 years and unfortunately continues to smoke but will try to quit.  He drinks alcohol occasionally and no history of drug abuse.  HPI  Past Medical History:  Diagnosis Date  . Arthritis    neck  . Diabetes ([place])   . Fatty liver 12/05/2019   By [place] 11/2019  . GERD (gastroesophageal reflux disease)   . Heart murmur    "  heart skip" since his 20's  . High cholesterol   . IBS (irritable bowel syndrome)   . OSA (obstructive sleep apnea) 09/15/2019   Sleep study 12/2017 - severe OSA with AHI 65.6, desat to 76% rec CPAP  . Severe obesity (BMI 35.0-39.9) with comorbidity ([place]) 09/04/2019    Past Surgical History:  Procedure Laterality Date  . BIOPSY  12/18/2019   Procedure:  BIOPSY;  Surgeon: Garner Nash, DO;  Location: [facility];  Service: Pulmonary;;  . BRONCHIAL BRUSHINGS  12/18/2019   Procedure: BRONCHIAL BRUSHINGS;  Surgeon: Garner Nash, DO;  Location: [facility];  Service: Pulmonary;;  . BRONCHIAL WASHINGS  12/18/2019   Procedure: BRONCHIAL WASHINGS;  Surgeon: Garner Nash, DO;  Location: [facility];  Service: Pulmonary;;  . COLONOSCOPY    . CYSTECTOMY     knee- left knee cyst  . ENDOBRONCHIAL ULTRASOUND  12/18/2019   Procedure: ENDOBRONCHIAL ULTRASOUND;  Surgeon: Garner Nash, DO;  Location: [facility];  Service: Pulmonary;;  . FINE NEEDLE ASPIRATION  12/18/2019   Procedure: FINE NEEDLE ASPIRATION;  Surgeon: Garner Nash, DO;  Location: [facility];  Service: Pulmonary;;  . PILONIDAL CYST EXCISION    . VIDEO BRONCHOSCOPY WITH ENDOBRONCHIAL ULTRASOUND Right 12/18/2019   Procedure: VIDEO BRONCHOSCOPY;  Surgeon: Garner Nash, DO;  Location: [facility];  Service: Pulmonary;  Laterality: Right;    Family History  Problem Relation Age of Onset  . CAD Mother        stents  . Stroke Neg Hx   . Diabetes Neg Hx   . Cancer Neg Hx   . Colon cancer Neg Hx   . Esophageal cancer Neg Hx   . Stomach cancer Neg Hx   . Rectal cancer Neg Hx     Social History Social History   Tobacco Use  . Smoking status: Current Every Day Smoker    Packs/day: 1.00    Years: 40.00    Pack years: 40.00  . Smokeless tobacco: Never Used  Substance Use Topics  . Alcohol use: Yes    Comment: Rarely  . Drug use: Never    No Known Allergies  Current Outpatient Medications  Medication Sig Dispense Refill  . aspirin EC 81 MG tablet Take 1 tablet (81 mg total) by mouth daily.    [name] atorvastatin (LIPITOR) 20 MG tablet Take 1 tablet (20 mg total) by mouth daily. (Patient taking differently: Take 10 mg by mouth in the morning and at bedtime. ) 90 tablet 3  . diazepam (VALIUM) 5 MG tablet TAKE 1 TABLET (5 MG TOTAL) BY MOUTH DAILY AS NEEDED FOR MUSCLE  SPASMS. 30 tablet 0  . HYDROcodone-acetaminophen (NORCO/VICODIN) 5-325 MG tablet Take 1 tablet by mouth every 6 (six) hours as needed for moderate pain. 30 tablet 0  . metFORMIN (GLUCOPHAGE) 1000 MG tablet Take 0.5 tablets (500 mg total) by mouth 2 (two) times daily with a meal. 180 tablet 3  . omeprazole (PRILOSEC) 40 MG capsule Take 1 capsule (40 mg total) by mouth daily. For 3 weeks then as needed (Patient taking differently: Take 40 mg by mouth daily as needed (acid reflux). ) 30 capsule 3  . sildenafil (REVATIO) 20 MG tablet Take 20-100 mg by mouth daily as needed (ED).      No current facility-administered medications for this visit.    Review of Systems  Constitutional: positive for fatigue Eyes: negative Ears, nose, mouth, throat, and face: negative Respiratory: positive for cough, dyspnea on exertion and pleurisy/chest  pain Cardiovascular: negative Gastrointestinal: negative Genitourinary:negative Integument/breast: negative Hematologic/lymphatic: negative Musculoskeletal:positive for bone pain Neurological: negative Behavioral/Psych: negative Endocrine: negative Allergic/Immunologic: negative  Physical Exam  NUU:VOZDG, healthy, no distress, well nourished, well developed and anxious SKIN: skin color, texture, turgor are normal, no rashes or significant lesions HEAD: Normocephalic, No masses, lesions, tenderness or abnormalities EYES: normal, PERRLA, Conjunctiva are pink and non-injected EARS: External ears normal, Canals clear OROPHARYNX:no exudate, no erythema and lips, buccal mucosa, and tongue normal  NECK: supple, no adenopathy, no JVD LYMPH:  no palpable lymphadenopathy, no hepatosplenomegaly LUNGS: clear to auscultation , and palpation HEART: regular rate & rhythm, no murmurs and no gallops ABDOMEN:abdomen soft, non-tender, normal bowel sounds and no masses or organomegaly BACK: No CVA tenderness, Range of motion is normal EXTREMITIES:no joint deformities,  effusion, or inflammation, no edema  NEURO: alert & oriented x 3 with fluent speech, no focal motor/sensory deficits  PERFORMANCE STATUS: ECOG 1  LABORATORY DATA: Lab Results  Component Value Date   WBC 12.4 (H) 12/24/2019   HGB 13.1 12/24/2019   HCT 40.5 12/24/2019   MCV 92.0 12/24/2019   PLT 341 12/24/2019      Chemistry      Component Value Date/Time   NA 138 12/24/2019 1400   K 4.1 12/24/2019 1400   CL 99 12/24/2019 1400   CO2 31 12/24/2019 1400   BUN 8 12/24/2019 1400   CREATININE 0.84 12/24/2019 1400      Component Value Date/Time   CALCIUM 9.4 12/24/2019 1400   ALKPHOS 96 12/24/2019 1400   AST 15 12/24/2019 1400   ALT 18 12/24/2019 1400   BILITOT 0.3 12/24/2019 1400       RADIOGRAPHIC STUDIES: DG Chest 2 View  Result Date: 12/12/2019 CLINICAL DATA:  RIGHT-side rib pain, wheezing on exam question pneumonia, rib injury, history diabetes mellitus, GERD, smoker with 40 pack-year smoking history EXAM: CHEST - 2 VIEW COMPARISON:  None FINDINGS: Normal heart size, mediastinal contours, and pulmonary vascularity. Focal opacity identified in the RIGHT upper lobe in the perihilar region, approximately 6.9 x 3.6 x 3.6 cm, appears mass-like on lateral view, question tumor versus infiltrate. Remaining lungs clear. No pleural effusion or pneumothorax. Osseous structures unremarkable. IMPRESSION: Focal RIGHT upper lobe opacity 3.6 x 3.6 x 6.9 cm question mass versus infiltrate; CT chest with contrast recommended to exclude neoplasm. These results will be called to the ordering clinician or representative by the Radiologist Assistant, and communication documented in the PACS or zVision Dashboard. Electronically Signed   By: Lavonia Dana M.D.   On: 12/12/2019 11:27   CT Chest Wo Contrast  Result Date: 12/12/2019 CLINICAL DATA:  Right upper lobe opacity. EXAM: CT CHEST WITHOUT CONTRAST TECHNIQUE: Multidetector CT imaging of the chest was performed following the standard protocol without  IV contrast. COMPARISON:  Radiograph of same day. FINDINGS: Cardiovascular: No evidence of thoracic aortic aneurysm. Normal cardiac size. No pericardial effusion. Mild coronary artery calcifications are noted. Mediastinum/Nodes: Thyroid gland and esophagus are unremarkable. 2.3 cm right paratracheal lymph node is noted concerning for metastatic disease. Evaluation of hilar adenopathy is limited due to the lack of intravenous contrast, but there is probable 2.3 cm right hilar lymph node. Lungs/Pleura: No pneumothorax or pleural effusion is noted. Left lung is clear. 4.6 x 3.4 cm irregular right upper lobe mass is noted consistent with primary malignancy. Upper Abdomen: Small nonobstructive left renal calculus. Musculoskeletal: Expansile destructive lesion is seen involving the lateral portion of the right eighth rib consistent with metastatic disease.  IMPRESSION: 4.6 x 3.4 cm irregular right upper lobe mass is noted consistent with primary malignancy. Enlarged right paratracheal and right hilar lymph nodes are noted concerning for metastatic disease. Expansile destructive lesion is seen involving the lateral portion of the right eighth rib consistent with metastatic disease. PET scan is recommended for further evaluation. These results will be called to the ordering clinician or representative by the Radiologist Assistant, and communication documented in the PACS or zVision Dashboard. Electronically Signed   By: Marijo Conception M.D.   On: 12/12/2019 15:33   MR BRAIN W WO CONTRAST  Result Date: 12/20/2019 CLINICAL DATA:  Non-small-cell lung cancer.  Staging. EXAM: MRI HEAD WITHOUT AND WITH CONTRAST TECHNIQUE: Multiplanar, multiecho pulse sequences of the brain and surrounding structures were obtained without and with intravenous contrast. CONTRAST:  15m GADAVIST GADOBUTROL 1 MMOL/ML IV SOLN COMPARISON:  None. FINDINGS: Brain: Ventricle size and cerebral volume normal. Scattered small white matter hyperintensities  bilaterally consistent with chronic microvascular ischemia. Negative for acute infarct, hemorrhage, mass Normal enhancement following contrast administration. No metastatic deposits. Vascular: Normal arterial flow voids. Skull and upper cervical spine: Negative Sinuses/Orbits: Mild mucosal edema paranasal sinuses. Negative orbit Other: None IMPRESSION: Negative for metastatic disease to the brain Mild chronic microvascular ischemic change in the white matter. No acute abnormality Electronically Signed   By: CFranchot GalloM.D.   On: 12/20/2019 11:33   [place]domen Complete  Result Date: 11/30/2019 CLINICAL DATA:  Right-sided abdominal pain EXAM: ABDOMEN ULTRASOUND COMPLETE COMPARISON:  None. FINDINGS: Gallbladder: No gallstones or wall thickening visualized. There is no pericholecystic fluid. No sonographic Murphy sign noted by sonographer. Common bile duct: Diameter: 2 mm. No intrahepatic, common hepatic, or common bile duct dilatation. Liver: No focal lesion identified. Liver echogenicity is increased diffusely. Portal vein is patent on color Doppler imaging with normal direction of blood flow towards the liver. IVC: No abnormality visualized. Pancreas: Visualized portion unremarkable. Portions of pancreas obscured by gas. Spleen: Size and appearance within normal limits. Right Kidney: Length: 12.2 cm. Echogenicity within normal limits. No mass or hydronephrosis visualized. Left Kidney: Length: 11.8 cm. Echogenicity within normal limits. No mass or hydronephrosis visualized. Abdominal aorta: No aneurysm visualized. Other findings: No demonstrable ascites. IMPRESSION: 1. Diffuse increase in liver echogenicity, a finding indicative of hepatic steatosis. No focal liver lesions are evident. It must be cautioned that the sensitivity of ultrasound for detection of focal liver lesions is somewhat diminished in this circumstance. 2. Portions of pancreas obscured by gas. Visualized portions of pancreas appear normal. 3.   Study otherwise unremarkable. Electronically Signed   By: WLowella GripIII M.D.   On: 11/30/2019 12:13   NM PET Image Initial (PI) Skull Base To Thigh  Result Date: 12/20/2019 CLINICAL DATA:  Initial treatment strategy for non-small cell lung cancer. EXAM: NUCLEAR MEDICINE PET SKULL BASE TO THIGH TECHNIQUE: 13.8 mCi F-18 FDG was injected intravenously. Full-ring PET imaging was performed from the skull base to thigh after the radiotracer. CT data was obtained and used for attenuation correction and anatomic localization. Fasting blood glucose: 128 mg/dl COMPARISON:  CT 12/12/2019 FINDINGS: Mediastinal blood pool activity: SUV max 3.2 Liver activity: SUV max NA NECK: Hypermetabolic RIGHT supraclavicular lymph node measures 10 mm (image 63/3) with SUV max equal 7.9. Incidental CT findings: none CHEST: Hypermetabolic mass in the central RIGHT upper lobe extending to the hilum measures 4.9 by 2.8 cm with SUV max equal 17.2. Hypermetabolic RIGHT lower paratracheal lymph node measuring 18 mm with SUV  max equal 12.5. Hypermetabolic subcarinal lymph node additionally. Incidental CT findings: none ABDOMEN/PELVIS: No abnormal hypermetabolic activity within the liver, pancreas, adrenal glands, or spleen. No hypermetabolic lymph nodes in the abdomen or pelvis. Incidental CT findings: none SKELETON: Hypermetabolic lesion in the sternum with SUV max equal 8.2. There is a corresponding lytic lesion with central soft tissue component measuring 16 mm on image 110/3). Skeletal metastasis within the lateral RIGHT eighth rib with central soft tissue expansion measuring 16 mm intense metabolic activity (SUV max equal 20.1. Similar lytic lesion with central soft tissue in the posterior RIGHT acetabulum measuring 2.3 cm with SUV max equal 20.1. Small lesion in the RIGHT eleventh rib at the costovertebral junction. Incidental CT findings: none IMPRESSION: 1. Hypermetabolic mass in the central RIGHT upper lobe consistent primary  bronchogenic carcinoma. 2. Hypermetabolic ipsilateral mediastinal nodal metastasis as well as hypermetabolic RIGHT supraclavicular nodal metastasis. 3. Hypermetabolic skeletal metastasis with sternal lesion, RIGHT rib lesion and RIGHT posterior acetabular hypermetabolic lytic lesion. Electronically Signed   By: Suzy Bouchard M.D.   On: 12/20/2019 12:02    ASSESSMENT: This is a very pleasant 54 years old white male recently diagnosed with a stage IV (T2b, N3, M1C) non-small cell lung cancer, squamous cell carcinoma presented with right upper lobe lung mass in addition to mediastinal and right supraclavicular lymphadenopathy as well as multiple metastatic bone lesions diagnosed in March 2021.   PLAN: I had a lengthy discussion with the patient and his fiance today about his current disease stage, prognosis and treatment options. I explained to the patient that he has incurable condition and all the treatment will be of palliative nature. I will request the pathology department to send his tissue block for PD-L1 expression. I discussed with the patient his treatment options including palliative care versus palliative systemic chemotherapy with carboplatin for AUC of 5, paclitaxel 175 NG/M2 and Keytruda 200 mg IV every 3 weeks with Neulasta support. The patient is interested in treatment.  I discussed with him the adverse effect of this treatment including but not limited to alopecia, myelosuppression, nausea and vomiting, peripheral neuropathy, liver or renal dysfunction as well as immunotherapy adverse effects. I will arrange for the patient to have a chemotherapy education class before the first dose of his treatment next week. I will call his pharmacy with prescription for Compazine for nausea as well as EMLA cream to be applied to the Port-A-Cath site before treatment. I will refer the patient to interventional radiology for Port-A-Cath placement. For the painful metastatic bone lesion, I will  refer the patient to radiation oncology for palliative radiotherapy.  He will continue his current treatment with hydrocodone for now until improvement of his pain with radiation. For smoking cessation I strongly encouraged the patient to quit smoking. For diabetes mellitus and dyslipidemia, he will continue with his current home medications. The patient will come back for follow-up visit in 2 weeks for evaluation and management of any adverse effect of his treatment. He was advised to call immediately if he has any concerning symptoms in the interval.  The patient voices understanding of current disease status and treatment options and is in agreement with the current care plan.  All questions were answered. The patient knows to call the clinic with any problems, questions or concerns. We can certainly see the patient much sooner if necessary.  Thank you so much for allowing me to participate in the care of Mike [name]. I will continue to follow up the patient with  you and assist in his care.   The total time spent in the appointment was 90 minutes.  Disclaimer: This note was dictated with voice recognition software. Similar sounding words can inadvertently be transcribed and may not be corrected upon review.   Eilleen Kempf December 24, 2019, 2:57 PM

## 2019-12-24 NOTE — Telephone Encounter (Signed)
ERx plz check to see - is he taking 1 or 2 at a time, how long does pain relief last after taking a dose?

## 2019-12-24 NOTE — Progress Notes (Signed)
Oncology Nurse Navigator Documentation  Oncology Nurse Navigator Flowsheets 12/24/2019  Navigator Location [facility]  Referral Date to RadOnc/MedOnc -  Navigator Encounter Type Other/I contacted pathology dept to send tissue for PDL 1.  Case number [id number]  Telephone -  Patient Visit Type MedOnc  Treatment Phase Pre-Tx/Tx Discussion  Barriers/Navigation Needs Coordination of Care  Education -  Interventions Coordination of Care  Acuity Level 2-Minimal Needs (1-2 Barriers Identified)  Coordination of Care Other  Education Method -  Time Spent with Patient 30

## 2019-12-24 NOTE — Progress Notes (Signed)
START ON PATHWAY REGIMEN - Non-Small Cell Lung     A cycle is every 21 days:     Pembrolizumab      Paclitaxel      Carboplatin   **Always confirm dose/schedule in your pharmacy ordering system**  Patient Characteristics: Stage IV Metastatic, Squamous, PS = 0, 1, First Line, PD-L1 Expression Positive 1-49% (TPS) / Negative / Not Tested / Awaiting Test Results and Immunotherapy Candidate Therapeutic Status: Stage IV Metastatic Histology: Squamous Cell Line of therapy: First Line ECOG Performance Status: 1 PD-L1 Expression Status: Awaiting Test Results Immunotherapy Candidate Status: Candidate for Immunotherapy Intent of Therapy: Non-Curative / Palliative Intent, Discussed with Patient

## 2019-12-24 NOTE — Telephone Encounter (Signed)
See Refill encounter, today.

## 2019-12-25 ENCOUNTER — Other Ambulatory Visit: Payer: Self-pay | Admitting: Radiology

## 2019-12-25 ENCOUNTER — Telehealth: Payer: Self-pay | Admitting: Oncology

## 2019-12-25 ENCOUNTER — Telehealth: Payer: Self-pay | Admitting: Internal Medicine

## 2019-12-25 NOTE — Telephone Encounter (Signed)
See Pt Msg, 12/24/19.

## 2019-12-25 NOTE — Telephone Encounter (Signed)
Scheduled per los. Called and spoke with patient. Confirmed appts  

## 2019-12-25 NOTE — Telephone Encounter (Signed)
Error

## 2019-12-26 ENCOUNTER — Ambulatory Visit
Admission: RE | Admit: 2019-12-26 | Discharge: 2019-12-26 | Disposition: A | Discharge status: Home / Self Care | Payer: 59 | Source: Ambulatory Visit | Attending: Radiation Oncology | Admitting: Radiation Oncology

## 2019-12-26 ENCOUNTER — Other Ambulatory Visit: Payer: 59

## 2019-12-26 ENCOUNTER — Other Ambulatory Visit: Payer: Self-pay

## 2019-12-26 ENCOUNTER — Encounter: Payer: Self-pay | Admitting: Radiation Oncology

## 2019-12-26 VITALS — BP 130/78 | HR 89 | Temp 97.8°F | Resp 20 | Ht 71.75 in | Wt 267.1 lb

## 2019-12-26 DIAGNOSIS — Z7984 Long term (current) use of oral hypoglycemic drugs: Secondary | ICD-10-CM | POA: Insufficient documentation

## 2019-12-26 DIAGNOSIS — Z79899 Other long term (current) drug therapy: Secondary | ICD-10-CM | POA: Insufficient documentation

## 2019-12-26 DIAGNOSIS — E7800 Pure hypercholesterolemia, unspecified: Secondary | ICD-10-CM | POA: Insufficient documentation

## 2019-12-26 DIAGNOSIS — G4730 Sleep apnea, unspecified: Secondary | ICD-10-CM | POA: Insufficient documentation

## 2019-12-26 DIAGNOSIS — E119 Type 2 diabetes mellitus without complications: Secondary | ICD-10-CM | POA: Insufficient documentation

## 2019-12-26 DIAGNOSIS — K760 Fatty (change of) liver, not elsewhere classified: Secondary | ICD-10-CM | POA: Diagnosis not present

## 2019-12-26 DIAGNOSIS — E669 Obesity, unspecified: Secondary | ICD-10-CM | POA: Insufficient documentation

## 2019-12-26 DIAGNOSIS — C7951 Secondary malignant neoplasm of bone: Secondary | ICD-10-CM | POA: Diagnosis not present

## 2019-12-26 DIAGNOSIS — C3411 Malignant neoplasm of upper lobe, right bronchus or lung: Secondary | ICD-10-CM | POA: Diagnosis present

## 2019-12-26 DIAGNOSIS — C3491 Malignant neoplasm of unspecified part of right bronchus or lung: Secondary | ICD-10-CM

## 2019-12-26 DIAGNOSIS — Z7982 Long term (current) use of aspirin: Secondary | ICD-10-CM | POA: Diagnosis not present

## 2019-12-26 DIAGNOSIS — K589 Irritable bowel syndrome without diarrhea: Secondary | ICD-10-CM | POA: Insufficient documentation

## 2019-12-26 DIAGNOSIS — F17210 Nicotine dependence, cigarettes, uncomplicated: Secondary | ICD-10-CM | POA: Diagnosis not present

## 2019-12-26 DIAGNOSIS — C778 Secondary and unspecified malignant neoplasm of lymph nodes of multiple regions: Secondary | ICD-10-CM | POA: Insufficient documentation

## 2019-12-26 DIAGNOSIS — G4733 Obstructive sleep apnea (adult) (pediatric): Secondary | ICD-10-CM | POA: Insufficient documentation

## 2019-12-26 DIAGNOSIS — M129 Arthropathy, unspecified: Secondary | ICD-10-CM | POA: Diagnosis not present

## 2019-12-26 DIAGNOSIS — K219 Gastro-esophageal reflux disease without esophagitis: Secondary | ICD-10-CM | POA: Insufficient documentation

## 2019-12-26 NOTE — Progress Notes (Signed)
Radiation Oncology         ([phone]) [phone] ________________________________  Initial Outpatient Consultation  Name: Mike [name] MRN: [id number]  Date: 12/26/2019  DOB: [birth year]  MW:[id number], [name], MD  [name], MD   REFERRING PHYSICIAN: [name], MD  DIAGNOSIS: The encounter diagnosis was Stage IV squamous cell carcinoma of right lung ([place]).  Stage IV (T2b, N3, M1C) non-small cell lung cancer, squamous cell carcinoma presented with right upper lobe lung mass in addition to mediastinal and right supraclavicular lymphadenopathy as well as multiple metastatic bone lesions  HISTORY OF PRESENT ILLNESS::Mike [name] is a 54 y.o. male who is accompanied by his fiance. The patient presented to his PCP on 12/12/2019 with right-sided rib/chest wall pain and right hip pain. He was noted to have wheezing on exam, and x-ray was performed in-office. This showed a 6.9 cm focal RUL opacity. Concerning for malignancy, he proceeded to emergent chest CT the same day, which revealed: 4.6 cm irregular RUL mass; enlarged right paratracheal and right hilar lymph nodes; expansile destructive lesion involving right 8th rib.  The patient was referred to Dr. Valeta Harms and underwent bronchoscopy with biopsy on 12/18/2019 showing: non-small cell carcinoma, p40 positive, consistent with squamous cell carcinoma.  He proceeded to PET scan on 3/4/20201, which showed hypermetabolism to: 4.9 cm RUL mass, SUV of 17.2; 1.8 cm right lower paratracheal lymph node, SUV of 12.5; subcarinal lymph node; right supraclavicular nodal metastasis; skeletal metastasis involving sternal lesion, right 8th rib, right posterior acetabular lytic lesion. Brain MRI performed the same day was negative for metastatic disease.  The patient was referred to Dr. Julien Nordmann on 12/24/2019. He expressed interest in treatment, and the plan is palliative systemic chemotherapy with carboplatin for AUC of 5, paclitaxel 175 NG/M2 and Keytruda 200  mg IV every 3 weeks with Neulasta support.  He has been kindly referred to me today to discuss palliative radiation therapy to the painful metastatic bone lesion.  PREVIOUS RADIATION THERAPY: No  PAST MEDICAL HISTORY:  Past Medical History:  Diagnosis Date  . Arthritis    neck  . Diabetes ([place])   . Fatty liver 12/05/2019   By [place] 11/2019  . GERD (gastroesophageal reflux disease)   . 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 High cholesterol   . IBS (irritable bowel syndrome)   . OSA (obstructive sleep apnea) 09/15/2019   Sleep study 12/2017 - severe OSA with AHI 65.6, desat to 76% rec CPAP  . Severe obesity (BMI 35.0-39.9) with comorbidity ([place]) 09/04/2019    PAST SURGICAL HISTORY: Past Surgical History:  Procedure Laterality Date  . BIOPSY  12/18/2019   Procedure: BIOPSY;  Surgeon: Garner Nash, DO;  Location: [facility];  Service: Pulmonary;;  . BRONCHIAL BRUSHINGS  12/18/2019   Procedure: BRONCHIAL BRUSHINGS;  Surgeon: Garner Nash, DO;  Location: [facility];  Service: Pulmonary;;  . BRONCHIAL WASHINGS  12/18/2019   Procedure: BRONCHIAL WASHINGS;  Surgeon: Garner Nash, DO;  Location: [facility];  Service: Pulmonary;;  . COLONOSCOPY    . CYSTECTOMY     knee- left knee cyst  . ENDOBRONCHIAL ULTRASOUND  12/18/2019   Procedure: ENDOBRONCHIAL ULTRASOUND;  Surgeon: Garner Nash, DO;  Location: [facility];  Service: Pulmonary;;  . FINE NEEDLE ASPIRATION  12/18/2019   Procedure: FINE NEEDLE ASPIRATION;  Surgeon: Garner Nash, DO;  Location: [facility];  Service: Pulmonary;;  . PILONIDAL CYST EXCISION    . VIDEO BRONCHOSCOPY WITH ENDOBRONCHIAL ULTRASOUND Right  12/18/2019   Procedure: VIDEO BRONCHOSCOPY;  Surgeon: Garner Nash, DO;  Location: [facility];  Service: Pulmonary;  Laterality: Right;    FAMILY HISTORY:  Family History  Problem Relation Age of Onset  . CAD Mother        stents  . Stroke Neg Hx   . Diabetes Neg Hx   . Cancer Neg Hx   .  Colon cancer Neg Hx   . Esophageal cancer Neg Hx   . Stomach cancer Neg Hx   . Rectal cancer Neg Hx     SOCIAL HISTORY:  Social History   Tobacco Use  . Smoking status: Current Every Day Smoker    Packs/day: 1.00    Years: 40.00    Pack years: 40.00  . Smokeless tobacco: Never Used  Substance Use Topics  . Alcohol use: Yes    Comment: Rarely  . Drug use: Never    ALLERGIES: No Known Allergies  MEDICATIONS:  Current Outpatient Medications  Medication Sig Dispense Refill  . aspirin EC 81 MG tablet Take 1 tablet (81 mg total) by mouth daily.    [name] atorvastatin (LIPITOR) 20 MG tablet Take 1 tablet (20 mg total) by mouth daily. (Patient taking differently: Take 10 mg by mouth in the morning and at bedtime. ) 90 tablet 3  . diazepam (VALIUM) 5 MG tablet TAKE 1 TABLET (5 MG TOTAL) BY MOUTH DAILY AS NEEDED FOR MUSCLE SPASMS. 30 tablet 0  . HYDROcodone-acetaminophen (NORCO/VICODIN) 5-325 MG tablet Take 1 tablet by mouth every 6 (six) hours as needed for moderate pain. 30 tablet 0  . lidocaine-prilocaine (EMLA) cream Apply to the Port-A-Cath site 30 minutes before treatment 30 g 0  . metFORMIN (GLUCOPHAGE) 1000 MG tablet Take 0.5 tablets (500 mg total) by mouth 2 (two) times daily with a meal. 180 tablet 3  . omeprazole (PRILOSEC) 40 MG capsule Take 1 capsule (40 mg total) by mouth daily. For 3 weeks then as needed (Patient taking differently: Take 40 mg by mouth daily as needed (acid reflux). ) 30 capsule 3  . prochlorperazine (COMPAZINE) 25 MG suppository Place 1 suppository (25 mg total) rectally every 12 (twelve) hours as needed for nausea or vomiting. 12 suppository 0  . sildenafil (REVATIO) 20 MG tablet Take 20-100 mg by mouth daily as needed (ED).      No current facility-administered medications for this encounter.    REVIEW OF SYSTEMS:  A 10+ POINT REVIEW OF SYSTEMS WAS OBTAINED including neurology, dermatology, psychiatry, cardiac, respiratory, lymph, extremities, GI, GU,  musculoskeletal, constitutional, reproductive, HEENT. He reports pain in right buttock and right rib, which are only somewhat eased by his hydrocodone. He rates the pain at 4-5/10. He also reports cough with associated mid-chest pain.  He is also reporting pain along the sternal area consistent with his osseous metastasis  in this area   PHYSICAL EXAM:  height is 5' 11.75" (1.822 m) and weight is 267 lb 2 oz (121.2 kg). His temporal temperature is 97.8 F (36.6 C). His blood pressure is 130/78 and his pulse is 89. His respiration is 20 and oxygen saturation is 97%.   General: Alert and oriented, in no acute distress HEENT: Head is normocephalic. Extraocular movements are intact. Neck: Neck is supple, no palpable cervical or supraclavicular lymphadenopathy. Heart: Regular in rate and rhythm with no murmurs, rubs, or gallops. Chest: Clear to auscultation bilaterally, with no rhonchi, wheezes, or rales. Abdomen: Soft, nontender, nondistended, with no rigidity or guarding. Extremities: No cyanosis  or edema. Lymphatics: see Neck Exam Skin: No concerning lesions. Musculoskeletal: symmetric strength and muscle tone throughout. Neurologic: Cranial nerves II through XII are grossly intact. No obvious focalities. Speech is fluent. Coordination is intact. Psychiatric: Judgment and insight are intact. Affect is appropriate. Patient has significant difficulty in getting up on the examination table in light of his right hip pain  ECOG = 2  0 - Asymptomatic (Fully active, able to carry on all predisease activities without restriction)  1 - Symptomatic but completely ambulatory (Restricted in physically strenuous activity but ambulatory and able to carry out work of a light or sedentary nature. For example, light housework, office work)  2 - Symptomatic, <50% in bed during the day (Ambulatory and capable of all self care but unable to carry out any work activities. Up and about more than 50% of waking  hours)  3 - Symptomatic, >50% in bed, but not bedbound (Capable of only limited self-care, confined to bed or chair 50% or more of waking hours)  4 - Bedbound (Completely disabled. Cannot carry on any self-care. Totally confined to bed or chair)  5 - Death   Eustace Pen MM, Creech RH, Tormey DC, et al. [phone]). "Toxicity and response criteria of the [facility]". [place] Oncol. 5 (6): 649-55  LABORATORY DATA:  Lab Results  Component Value Date   WBC 12.4 (H) 12/24/2019   HGB 13.1 12/24/2019   HCT 40.5 12/24/2019   MCV 92.0 12/24/2019   PLT 341 12/24/2019   NEUTROABS 8.3 (H) 12/24/2019   Lab Results  Component Value Date   NA 138 12/24/2019   K 4.1 12/24/2019   CL 99 12/24/2019   CO2 31 12/24/2019   GLUCOSE 147 (H) 12/24/2019   CREATININE 0.84 12/24/2019   CALCIUM 9.4 12/24/2019      RADIOGRAPHY: DG Chest 2 View  Result Date: 12/12/2019 CLINICAL DATA:  RIGHT-side rib pain, wheezing on exam question pneumonia, rib injury, history diabetes mellitus, GERD, smoker with 40 pack-year smoking history EXAM: CHEST - 2 VIEW COMPARISON:  None FINDINGS: Normal heart size, mediastinal contours, and pulmonary vascularity. Focal opacity identified in the RIGHT upper lobe in the perihilar region, approximately 6.9 x 3.6 x 3.6 cm, appears mass-like on lateral view, question tumor versus infiltrate. Remaining lungs clear. No pleural effusion or pneumothorax. Osseous structures unremarkable. IMPRESSION: Focal RIGHT upper lobe opacity 3.6 x 3.6 x 6.9 cm question mass versus infiltrate; CT chest with contrast recommended to exclude neoplasm. These results will be called to the ordering clinician or representative by the Radiologist Assistant, and communication documented in the PACS or zVision Dashboard. Electronically Signed   By: Lavonia Dana M.D.   On: 12/12/2019 11:27   CT Chest Wo Contrast  Result Date: 12/12/2019 CLINICAL DATA:  Right upper lobe opacity. EXAM: CT CHEST  WITHOUT CONTRAST TECHNIQUE: Multidetector CT imaging of the chest was performed following the standard protocol without IV contrast. COMPARISON:  Radiograph of same day. FINDINGS: Cardiovascular: No evidence of thoracic aortic aneurysm. Normal cardiac size. No pericardial effusion. Mild coronary artery calcifications are noted. Mediastinum/Nodes: Thyroid gland and esophagus are unremarkable. 2.3 cm right paratracheal lymph node is noted concerning for metastatic disease. Evaluation of hilar adenopathy is limited due to the lack of intravenous contrast, but there is probable 2.3 cm right hilar lymph node. Lungs/Pleura: No pneumothorax or pleural effusion is noted. Left lung is clear. 4.6 x 3.4 cm irregular right upper lobe mass is noted consistent with primary malignancy. Upper Abdomen:  Small nonobstructive left renal calculus. Musculoskeletal: Expansile destructive lesion is seen involving the lateral portion of the right eighth rib consistent with metastatic disease. IMPRESSION: 4.6 x 3.4 cm irregular right upper lobe mass is noted consistent with primary malignancy. Enlarged right paratracheal and right hilar lymph nodes are noted concerning for metastatic disease. Expansile destructive lesion is seen involving the lateral portion of the right eighth rib consistent with metastatic disease. PET scan is recommended for further evaluation. These results will be called to the ordering clinician or representative by the Radiologist Assistant, and communication documented in the PACS or zVision Dashboard. Electronically Signed   By: Marijo Conception M.D.   On: 12/12/2019 15:33   MR BRAIN W WO CONTRAST  Result Date: 12/20/2019 CLINICAL DATA:  Non-small-cell lung cancer.  Staging. EXAM: MRI HEAD WITHOUT AND WITH CONTRAST TECHNIQUE: Multiplanar, multiecho pulse sequences of the brain and surrounding structures were obtained without and with intravenous contrast. CONTRAST:  35mL GADAVIST GADOBUTROL 1 MMOL/ML IV SOLN  COMPARISON:  None. FINDINGS: Brain: Ventricle size and cerebral volume normal. Scattered small white matter hyperintensities bilaterally consistent with chronic microvascular ischemia. Negative for acute infarct, hemorrhage, mass Normal enhancement following contrast administration. No metastatic deposits. Vascular: Normal arterial flow voids. Skull and upper cervical spine: Negative Sinuses/Orbits: Mild mucosal edema paranasal sinuses. Negative orbit Other: None IMPRESSION: Negative for metastatic disease to the brain Mild chronic microvascular ischemic change in the white matter. No acute abnormality Electronically Signed   By: Franchot Gallo M.D.   On: 12/20/2019 11:33   US Abdomen Complete  Result Date: 11/30/2019 CLINICAL DATA:  Right-sided abdominal pain EXAM: ABDOMEN ULTRASOUND COMPLETE COMPARISON:  None. FINDINGS: Gallbladder: No gallstones or wall thickening visualized. There is no pericholecystic fluid. No sonographic Murphy sign noted by sonographer. Common bile duct: Diameter: 2 mm. No intrahepatic, common hepatic, or common bile duct dilatation. Liver: No focal lesion identified. Liver echogenicity is increased diffusely. Portal vein is patent on color Doppler imaging with normal direction of blood flow towards the liver. IVC: No abnormality visualized. Pancreas: Visualized portion unremarkable. Portions of pancreas obscured by gas. Spleen: Size and appearance within normal limits. Right Kidney: Length: 12.2 cm. Echogenicity within normal limits. No mass or hydronephrosis visualized. Left Kidney: Length: 11.8 cm. Echogenicity within normal limits. No mass or hydronephrosis visualized. Abdominal aorta: No aneurysm visualized. Other findings: No demonstrable ascites. IMPRESSION: 1. Diffuse increase in liver echogenicity, a finding indicative of hepatic steatosis. No focal liver lesions are evident. It must be cautioned that the sensitivity of ultrasound for detection of focal liver lesions is somewhat  diminished in this circumstance. 2. Portions of pancreas obscured by gas. Visualized portions of pancreas appear normal. 3.  Study otherwise unremarkable. Electronically Signed   By: Lowella Grip III M.D.   On: 11/30/2019 12:13   NM PET Image Initial (PI) Skull Base To Thigh  Result Date: 12/20/2019 CLINICAL DATA:  Initial treatment strategy for non-small cell lung cancer. EXAM: NUCLEAR MEDICINE PET SKULL BASE TO THIGH TECHNIQUE: 13.8 mCi F-18 FDG was injected intravenously. Full-ring PET imaging was performed from the skull base to thigh after the radiotracer. CT data was obtained and used for attenuation correction and anatomic localization. Fasting blood glucose: 128 mg/dl COMPARISON:  CT 12/12/2019 FINDINGS: Mediastinal blood pool activity: SUV max 3.2 Liver activity: SUV max NA NECK: Hypermetabolic RIGHT supraclavicular lymph node measures 10 mm (image 63/3) with SUV max equal 7.9. Incidental CT findings: none CHEST: Hypermetabolic mass in the central RIGHT upper lobe extending  to the hilum measures 4.9 by 2.8 cm with SUV max equal 17.2. Hypermetabolic RIGHT lower paratracheal lymph node measuring 18 mm with SUV max equal 12.5. Hypermetabolic subcarinal lymph node additionally. Incidental CT findings: none ABDOMEN/PELVIS: No abnormal hypermetabolic activity within the liver, pancreas, adrenal glands, or spleen. No hypermetabolic lymph nodes in the abdomen or pelvis. Incidental CT findings: none SKELETON: Hypermetabolic lesion in the sternum with SUV max equal 8.2. There is a corresponding lytic lesion with central soft tissue component measuring 16 mm on image 110/3). Skeletal metastasis within the lateral RIGHT eighth rib with central soft tissue expansion measuring 16 mm intense metabolic activity (SUV max equal 20.1. Similar lytic lesion with central soft tissue in the posterior RIGHT acetabulum measuring 2.3 cm with SUV max equal 20.1. Small lesion in the RIGHT eleventh rib at the costovertebral  junction. Incidental CT findings: none IMPRESSION: 1. Hypermetabolic mass in the central RIGHT upper lobe consistent primary bronchogenic carcinoma. 2. Hypermetabolic ipsilateral mediastinal nodal metastasis as well as hypermetabolic RIGHT supraclavicular nodal metastasis. 3. Hypermetabolic skeletal metastasis with sternal lesion, RIGHT rib lesion and RIGHT posterior acetabular hypermetabolic lytic lesion. Electronically Signed   By: Suzy Bouchard M.D.   On: 12/20/2019 12:02      IMPRESSION: Stage IV (T2b, N3, M1C) non-small cell lung cancer, squamous cell carcinoma.  Patient is quite symptomatic from his osseous metastasis involving the right acetabular area right rib cage area and sternal region.  He would be a good candidate for short course of palliative radiation therapy directed at all 3 areas    Today, I talked to the patient and his fiance about the findings and work-up thus far.  We discussed the natural history of lung cancer and general treatment, highlighting the role of radiotherapy in the management of painful osseous metastases.  We discussed the available radiation techniques, and focused on the details of logistics and delivery.  We reviewed the anticipated acute and late sequelae associated with radiation in this setting.  The patient was encouraged to ask questions that I answered to the best of my ability.  A patient consent form was discussed and signed.  We retained a copy for our records.  The patient would like to proceed with radiation and will be scheduled for CT simulation.  PLAN: Simulation scheduled simulation for March 15 with treatments to begin March 16 or 17.  Anticipate 10 treatments to the all 3 areas as mentioned above.    ------------------------------------------------  Blair Promise, PhD, MD  This document serves as a record of services personally performed by Gery Pray, MD. It was created on his behalf by Wilburn Mylar, a trained medical scribe.  The creation of this record is based on the scribe's personal observations and the provider's statements to them. This document has been checked and approved by the attending provider.

## 2019-12-26 NOTE — Patient Instructions (Signed)
Coronavirus (COVID-19) Are you at risk?  Are you at risk for the Coronavirus (COVID-19)?  To be considered HIGH RISK for Coronavirus (COVID-19), you have to meet the following criteria:  . Traveled to [place], [place], [place], [place] or [place]; or in the [place] to [place], [place], [place], or [place]; and have fever, cough, and shortness of breath within the last 2 weeks of travel OR . Been in close contact with a person diagnosed with COVID-19 within the last 2 weeks and have fever, cough, and shortness of breath . IF YOU DO NOT MEET THESE CRITERIA, YOU ARE CONSIDERED LOW RISK FOR COVID-19.  What to do if you are HIGH RISK for COVID-19?  . If you are having a medical emergency, call 911. . Seek medical care right away. Before you go to a doctor's office, urgent care or emergency department, call ahead and tell them about your recent travel, contact with someone diagnosed with COVID-19, and your symptoms. You should receive instructions from your physician's office regarding next steps of care.  . When you arrive at healthcare provider, tell the healthcare staff immediately you have returned from visiting [place], [place], [place], [place] or [place]; or traveled in the [place] to [place], [place], [place], or [place]; in the last two weeks or you have been in close contact with a person diagnosed with COVID-19 in the last 2 weeks.   . Tell the health care staff about your symptoms: fever, cough and shortness of breath. . After you have been seen by a medical provider, you will be either: o Tested for (COVID-19) and discharged home on quarantine except to seek medical care if symptoms worsen, and asked to  - Stay home and avoid contact with others until you get your results (4-5 days)  - Avoid travel on public transportation if possible (such as bus, train, or airplane) or o Sent to the Emergency Department by EMS for evaluation, COVID-19 testing, and possible  admission depending on your condition and test results.  What to do if you are LOW RISK for COVID-19?  Reduce your risk of any infection by using the same precautions used for avoiding the common cold or flu:  . Wash your hands often with soap and warm water for at least 20 seconds.  If soap and water are not readily available, use an alcohol-based hand sanitizer with at least 60% alcohol.  . If coughing or sneezing, cover your mouth and nose by coughing or sneezing into the elbow areas of your shirt or coat, into a tissue or into your sleeve (not your hands). . Avoid shaking hands with others and consider head nods or verbal greetings only. . Avoid touching your eyes, nose, or mouth with unwashed hands.  . Avoid close contact with people who are sick. . Avoid places or events with large numbers of people in one location, like concerts or sporting events. . Carefully consider travel plans you have or are making. . If you are planning any travel outside or inside the [place], visit the CDC's Travelers' Health webpage for the latest health notices. . If you have some symptoms but not all symptoms, continue to monitor at home and seek medical attention if your symptoms worsen. . If you are having a medical emergency, call 911.   ADDITIONAL HEALTHCARE OPTIONS FOR PATIENTS  [facility] / e-Visit: [email or web address]         [facility]: [phone]  [facility]: [phone]                   [facility]: [phone]   

## 2019-12-26 NOTE — Progress Notes (Signed)
Histology and Location of Primary Cancer: ASSESSMENT: This is a very pleasant 54 years old white male recently diagnosed with a stage IV (T2b, N3, M1C) non-small cell lung cancer, squamous cell carcinoma presented with right upper lobe lung mass in addition to mediastinal and right supraclavicular lymphadenopathy as well as multiple metastatic bone lesions diagnosed in March 2021  Location(s) of Symptomatic tumor(s): Per PET scan 12/20/19:  IMPRESSION: 1. Hypermetabolic mass in the central RIGHT upper lobe consistent primary bronchogenic carcinoma. 2. Hypermetabolic ipsilateral mediastinal nodal metastasis as well as hypermetabolic RIGHT supraclavicular nodal metastasis. 3. Hypermetabolic skeletal metastasis with sternal lesion, RIGHT rib lesion and RIGHT posterior acetabular hypermetabolic lytic lesion.  Past/Anticipated chemotherapy by medical oncology, if any: Per Dr. Julien Nordmann 12/24/19: PLAN: I had a lengthy discussion with the patient and his fiance today about his current disease stage, prognosis and treatment options. I explained to the patient that he has incurable condition and all the treatment will be of palliative nature. I will request the pathology department to send his tissue block for PD-L1 expression. I discussed with the patient his treatment options including palliative care versus palliative systemic chemotherapy with carboplatin for AUC of 5, paclitaxel 175 NG/M2 and Keytruda 200 mg IV every 3 weeks with Neulasta support. The patient is interested in treatment.  I discussed with him the adverse effect of this treatment including but not limited to alopecia, myelosuppression, nausea and vomiting, peripheral neuropathy, liver or renal dysfunction as well as immunotherapy adverse effects. I will arrange for the patient to have a chemotherapy education class before the first dose of his treatment next week. I will call his pharmacy with prescription for Compazine for nausea as well as  EMLA cream to be applied to the Port-A-Cath site before treatment. I will refer the patient to interventional radiology for Port-A-Cath placement. For the painful metastatic bone lesion, I will refer the patient to radiation oncology for palliative radiotherapy.  He will continue his current treatment with hydrocodone for now until improvement of his pain with radiation. For smoking cessation I strongly encouraged the patient to quit smoking. For diabetes mellitus and dyslipidemia, he will continue with his current home medications. The patient will come back for follow-up visit in 2 weeks for evaluation and management of any adverse effect of his treatment.   Pain on a scale of 0-10 is: Pt reports pain in RIGHT buttock and RIGHT rib, eased somewhat by PRN pain medication. Pt rates pain 4-5/10. Pt also reports mid-chest with coughing.   Ambulatory status? Walker? Wheelchair?: Pt with steady gait, without assistive device.  SAFETY ISSUES:  Prior radiation? No  Pacemaker/ICD? No  Possible current pregnancy? N/A  Is the patient on methotrexate? No  Additional Complaints / other details:  Pt presents today for initial consult with Dr. Sondra Come for Radiation Oncology. Pt is accompanied by fiance, Mamie Nick.  BP 130/78 (BP Location: Left Arm, Patient Position: Sitting)   Pulse 89   Temp 97.8 F (36.6 C) (Temporal)   Resp 20   Ht 5' 11.75" (1.822 m)   Wt 267 lb 2 oz (121.2 kg)   SpO2 97%   BMI 36.48 kg/m   Wt Readings from Last 3 Encounters:  12/26/19 267 lb 2 oz (121.2 kg)  12/24/19 271 lb 14.4 oz (123.3 kg)  12/18/19 264 lb (119.7 kg)   Loma Sousa, RN BSN

## 2019-12-27 ENCOUNTER — Ambulatory Visit (HOSPITAL_COMMUNITY)
Admission: RE | Admit: 2019-12-27 | Discharge: 2019-12-27 | Disposition: A | Discharge status: Home / Self Care | Payer: 59 | Source: Ambulatory Visit | Attending: Internal Medicine | Admitting: Internal Medicine

## 2019-12-27 ENCOUNTER — Other Ambulatory Visit: Payer: Self-pay

## 2019-12-27 ENCOUNTER — Ambulatory Visit: Payer: 59 | Admitting: Radiation Oncology

## 2019-12-27 ENCOUNTER — Encounter (HOSPITAL_COMMUNITY): Payer: Self-pay

## 2019-12-27 ENCOUNTER — Other Ambulatory Visit: Payer: Self-pay | Admitting: Radiation Oncology

## 2019-12-27 ENCOUNTER — Other Ambulatory Visit: Payer: Self-pay | Admitting: Internal Medicine

## 2019-12-27 DIAGNOSIS — Z79899 Other long term (current) drug therapy: Secondary | ICD-10-CM | POA: Diagnosis not present

## 2019-12-27 DIAGNOSIS — Z7982 Long term (current) use of aspirin: Secondary | ICD-10-CM | POA: Insufficient documentation

## 2019-12-27 DIAGNOSIS — K219 Gastro-esophageal reflux disease without esophagitis: Secondary | ICD-10-CM | POA: Insufficient documentation

## 2019-12-27 DIAGNOSIS — Z7984 Long term (current) use of oral hypoglycemic drugs: Secondary | ICD-10-CM | POA: Insufficient documentation

## 2019-12-27 DIAGNOSIS — K589 Irritable bowel syndrome without diarrhea: Secondary | ICD-10-CM | POA: Insufficient documentation

## 2019-12-27 DIAGNOSIS — Z6835 Body mass index (BMI) 35.0-35.9, adult: Secondary | ICD-10-CM | POA: Insufficient documentation

## 2019-12-27 DIAGNOSIS — F17210 Nicotine dependence, cigarettes, uncomplicated: Secondary | ICD-10-CM | POA: Insufficient documentation

## 2019-12-27 DIAGNOSIS — E119 Type 2 diabetes mellitus without complications: Secondary | ICD-10-CM | POA: Insufficient documentation

## 2019-12-27 DIAGNOSIS — G4733 Obstructive sleep apnea (adult) (pediatric): Secondary | ICD-10-CM | POA: Insufficient documentation

## 2019-12-27 DIAGNOSIS — E669 Obesity, unspecified: Secondary | ICD-10-CM | POA: Insufficient documentation

## 2019-12-27 DIAGNOSIS — C3491 Malignant neoplasm of unspecified part of right bronchus or lung: Secondary | ICD-10-CM | POA: Diagnosis not present

## 2019-12-27 DIAGNOSIS — E7800 Pure hypercholesterolemia, unspecified: Secondary | ICD-10-CM | POA: Insufficient documentation

## 2019-12-27 HISTORY — PX: IR IMAGING GUIDED PORT INSERTION: IMG5740

## 2019-12-27 LAB — CBC WITH DIFFERENTIAL/PLATELET
Abs Immature Granulocytes: 0.06 10*3/uL (ref 0.00–0.07)
Basophils Absolute: 0.1 10*3/uL (ref 0.0–0.1)
Basophils Relative: 1 %
Eosinophils Absolute: 0.1 10*3/uL (ref 0.0–0.5)
Eosinophils Relative: 1 %
HCT: 41.7 % (ref 39.0–52.0)
Hemoglobin: 13.1 g/dL (ref 13.0–17.0)
Immature Granulocytes: 1 %
Lymphocytes Relative: 24 %
Lymphs Abs: 2.8 10*3/uL (ref 0.7–4.0)
MCH: 29.6 pg (ref 26.0–34.0)
MCHC: 31.4 g/dL (ref 30.0–36.0)
MCV: 94.3 fL (ref 80.0–100.0)
Monocytes Absolute: 1 10*3/uL (ref 0.1–1.0)
Monocytes Relative: 8 %
Neutro Abs: 7.5 10*3/uL (ref 1.7–7.7)
Neutrophils Relative %: 65 %
Platelets: 354 10*3/uL (ref 150–400)
RBC: 4.42 MIL/uL (ref 4.22–5.81)
RDW: 13.6 % (ref 11.5–15.5)
WBC: 11.5 10*3/uL — ABNORMAL HIGH (ref 4.0–10.5)
nRBC: 0 % (ref 0.0–0.2)

## 2019-12-27 LAB — GLUCOSE, CAPILLARY: Glucose-Capillary: 98 mg/dL (ref 70–99)

## 2019-12-27 MED ORDER — LIDOCAINE HCL 1 % IJ SOLN
INTRAMUSCULAR | Status: AC
  Filled 2019-12-27: qty 20

## 2019-12-27 MED ORDER — MIDAZOLAM HCL 2 MG/2ML IJ SOLN
INTRAMUSCULAR | Status: AC
  Filled 2019-12-27: qty 4

## 2019-12-27 MED ORDER — SODIUM CHLORIDE 0.9 % IV SOLN: INTRAVENOUS | Status: DC

## 2019-12-27 MED ORDER — FENTANYL CITRATE (PF) 100 MCG/2ML IJ SOLN
INTRAMUSCULAR | Status: AC | PRN
  Administered 2019-12-27 (×2): 50 ug via INTRAVENOUS

## 2019-12-27 MED ORDER — LIDOCAINE HCL (PF) 1 % IJ SOLN
INTRAMUSCULAR | Status: AC | PRN
  Administered 2019-12-27: 5 mL

## 2019-12-27 MED ORDER — MIDAZOLAM HCL 2 MG/2ML IJ SOLN
INTRAMUSCULAR | Status: AC | PRN
  Administered 2019-12-27 (×4): 1 mg via INTRAVENOUS

## 2019-12-27 MED ORDER — OXYCODONE HCL 10 MG PO TABS: 10.0000 mg | ORAL_TABLET | Freq: Four times a day (QID) | ORAL | 0 refills | Status: DC | PRN

## 2019-12-27 MED ORDER — CEFAZOLIN SODIUM-DEXTROSE 2-4 GM/100ML-% IV SOLN: 2.0000 g | INTRAVENOUS | Status: AC

## 2019-12-27 MED ORDER — HEPARIN SOD (PORK) LOCK FLUSH 100 UNIT/ML IV SOLN
INTRAVENOUS | Status: AC
  Filled 2019-12-27: qty 5

## 2019-12-27 MED ORDER — LIDOCAINE HCL (PF) 1 % IJ SOLN
INTRAMUSCULAR | Status: AC | PRN
  Administered 2019-12-27: 10 mL

## 2019-12-27 MED ORDER — HEPARIN SOD (PORK) LOCK FLUSH 100 UNIT/ML IV SOLN
INTRAVENOUS | Status: AC | PRN
  Administered 2019-12-27: 500 [IU] via INTRAVENOUS

## 2019-12-27 MED ORDER — FENTANYL CITRATE (PF) 100 MCG/2ML IJ SOLN
INTRAMUSCULAR | Status: AC
  Filled 2019-12-27: qty 2

## 2019-12-27 MED ORDER — CEFAZOLIN SODIUM-DEXTROSE 2-4 GM/100ML-% IV SOLN
INTRAVENOUS | Status: AC
  Administered 2019-12-27: 2 g via INTRAVENOUS
  Filled 2019-12-27: qty 100

## 2019-12-27 NOTE — Procedures (Signed)
Interventional Radiology Procedure Note  Procedure: Single Lumen Power Port Placement    Access:  Left IJ vein.  Findings: Catheter tip positioned at SVC/RA junction. Port is ready for immediate use.   Complications: None  EBL: < 10 mL  Recommendations:  - Ok to shower in 24 hours - Do not submerge for 7 days - Routine line care   Amorina Doerr T. Kathlene Cote, M.D Pager:  [phone]

## 2019-12-27 NOTE — H&P (Signed)
Chief Complaint: Patient was seen in consultation today for lung cancer/Port-a-cath placement.  Referring Physician(s): [name],[name]  Supervising Physician: Aletta Edouard  Patient Status: [facility] - Out-pt  History of Present Illness: Mike [name] is a 53 y.o. male with a past medical history of high cholesterol, lung cancer, GERD, IBS, fatty liver disease, diabetes mellitus, obesity, OSA, arthritis, and prior tobacco use. He was unfortunately diagnosed with non-small cell (squamous cell carcinoma) right lung cancer in 12/2019. His cancer is managed by Dr. Julien Nordmann. He has tentative plans to begin palliative systemic chemotherapy as management.  IR consulted by Dr. Julien Nordmann for possible image-guided Port-a-cath placement. Patient awake and alert laying in bed. Complains of dyspnea, stable at this time. Denies fever, chills, chest pain, abdominal pain, or headache.   Past Medical History:  Diagnosis Date  . Arthritis    neck  . Diabetes ([place])   . Fatty liver 12/05/2019   By [place] 11/2019  . GERD (gastroesophageal reflux disease)   . 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 High cholesterol   . IBS (irritable bowel syndrome)   . OSA (obstructive sleep apnea) 09/15/2019   Sleep study 12/2017 - severe OSA with AHI 65.6, desat to 76% rec CPAP  . Severe obesity (BMI 35.0-39.9) with comorbidity ([place]) 09/04/2019    Past Surgical History:  Procedure Laterality Date  . BIOPSY  12/18/2019   Procedure: BIOPSY;  Surgeon: Garner Nash, DO;  Location: [facility];  Service: Pulmonary;;  . BRONCHIAL BRUSHINGS  12/18/2019   Procedure: BRONCHIAL BRUSHINGS;  Surgeon: Garner Nash, DO;  Location: [facility];  Service: Pulmonary;;  . BRONCHIAL WASHINGS  12/18/2019   Procedure: BRONCHIAL WASHINGS;  Surgeon: Garner Nash, DO;  Location: [facility];  Service: Pulmonary;;  . COLONOSCOPY    . CYSTECTOMY     knee- left knee cyst  . ENDOBRONCHIAL ULTRASOUND  12/18/2019   Procedure:  ENDOBRONCHIAL ULTRASOUND;  Surgeon: Garner Nash, DO;  Location: [facility];  Service: Pulmonary;;  . FINE NEEDLE ASPIRATION  12/18/2019   Procedure: FINE NEEDLE ASPIRATION;  Surgeon: Garner Nash, DO;  Location: [facility];  Service: Pulmonary;;  . PILONIDAL CYST EXCISION    . VIDEO BRONCHOSCOPY WITH ENDOBRONCHIAL ULTRASOUND Right 12/18/2019   Procedure: VIDEO BRONCHOSCOPY;  Surgeon: Garner Nash, DO;  Location: [facility];  Service: Pulmonary;  Laterality: Right;    Allergies: Patient has no known allergies.  Medications: Prior to Admission medications   Medication Sig Start Date End Date Taking? Authorizing Provider  aspirin EC 81 MG tablet Take 1 tablet (81 mg total) by mouth daily. 11/23/19   [name], MD  atorvastatin (LIPITOR) 20 MG tablet Take 1 tablet (20 mg total) by mouth daily. Patient taking differently: Take 10 mg by mouth in the morning and at bedtime.  11/23/19   [name], MD  diazepam (VALIUM) 5 MG tablet TAKE 1 TABLET (5 MG TOTAL) BY MOUTH DAILY AS NEEDED FOR MUSCLE SPASMS. 11/25/19   [name], MD  HYDROcodone-acetaminophen (NORCO/VICODIN) 5-325 MG tablet Take 1 tablet by mouth every 6 (six) hours as needed for moderate pain. 12/24/19   [name], MD  lidocaine-prilocaine (EMLA) cream Apply to the Port-A-Cath site 30 minutes before treatment 12/24/19   [name], MD  metFORMIN (GLUCOPHAGE) 1000 MG tablet Take 0.5 tablets (500 mg total) by mouth 2 (two) times daily with a meal. 09/05/19   [name], MD  omeprazole (PRILOSEC) 40 MG capsule Take 1 capsule (  40 mg total) by mouth daily. For 3 weeks then as needed Patient taking differently: Take 40 mg by mouth daily as needed (acid reflux).  09/04/19   [name], MD  prochlorperazine (COMPAZINE) 25 MG suppository Place 1 suppository (25 mg total) rectally every 12 (twelve) hours as needed for nausea or vomiting. 12/24/19   [name], MD  sildenafil (REVATIO) 20 MG  tablet Take 20-100 mg by mouth daily as needed (ED).  05/30/19   [provider]     Family History  Problem Relation Age of Onset  . CAD Mother        stents  . Stroke Neg Hx   . Diabetes Neg Hx   . Cancer Neg Hx   . Colon cancer Neg Hx   . Esophageal cancer Neg Hx   . Stomach cancer Neg Hx   . Rectal cancer Neg Hx     Social History   Socioeconomic History  . Marital status: Divorced    Spouse name: Not on file  . Number of children: Not on file  . Years of education: Not on file  . Highest education level: Not on file  Occupational History  . Not on file  Tobacco Use  . Smoking status: Current Every Day Smoker    Packs/day: 1.00    Years: 40.00    Pack years: 40.00  . Smokeless tobacco: Never Used  Substance and Sexual Activity  . Alcohol use: Yes    Comment: Rarely  . Drug use: Never  . Sexual activity: Yes    Partners: Female  Other Topics Concern  . Not on file  Social History Narrative   Lives with brother Claiborne Billings) and fiancee Mamie Nick)   Occ: Civil engineer, contracting    Edu: HS, self taught [employer]   Activity:    Diet:    Social Determinants of Health   Financial Resource Strain:   . Difficulty of Paying Living Expenses:   Food Insecurity:   . Worried About Charity fundraiser in the Last Year:   . Arboriculturist in the Last Year:   Transportation Needs:   . Film/video editor (Medical):   [name] Lack of Transportation (Non-Medical):   Physical Activity:   . Days of Exercise per Week:   . Minutes of Exercise per Session:   Stress:   . Feeling of Stress :   Social Connections:   . Frequency of Communication with Friends and Family:   . Frequency of Social Gatherings with Friends and Family:   . Attends Religious Services:   . Active Member of Clubs or Organizations:   . Attends Archivist Meetings:   [name] Marital Status:      Review of Systems: A 12 point ROS discussed and pertinent positives are indicated in the HPI  above.  All other systems are negative.  Review of Systems  Constitutional: Negative for chills and fever.  Respiratory: Positive for shortness of breath. Negative for wheezing.   Cardiovascular: Negative for chest pain and palpitations.  Gastrointestinal: Negative for abdominal pain.  Neurological: Negative for headaches.  Psychiatric/Behavioral: Negative for behavioral problems and confusion.    Vital Signs: BP 132/83   Pulse 93   Temp 98.7 F (37.1 C) (Oral)   Resp 20   SpO2 97%   Physical Exam Vitals and nursing note reviewed.  Constitutional:      General: He is not in acute distress.    Appearance: Normal appearance.  Cardiovascular:  Rate and Rhythm: Normal rate and regular rhythm.     Heart sounds: Normal heart sounds. No murmur.  Pulmonary:     Effort: Pulmonary effort is normal. No respiratory distress.     Breath sounds: Wheezing present.  Skin:    General: Skin is warm and dry.  Neurological:     Mental Status: He is alert and oriented to person, place, and time.  Psychiatric:        Mood and Affect: Mood normal.        Behavior: Behavior normal.      MD Evaluation Airway: WNL Heart: WNL Abdomen: WNL Chest/ Lungs: WNL ASA  Classification: 3 Mallampati/Airway Score: One   Imaging: DG Chest 2 View  Result Date: 12/12/2019 CLINICAL DATA:  RIGHT-side rib pain, wheezing on exam question pneumonia, rib injury, history diabetes mellitus, GERD, smoker with 40 pack-year smoking history EXAM: CHEST - 2 VIEW COMPARISON:  None FINDINGS: Normal heart size, mediastinal contours, and pulmonary vascularity. Focal opacity identified in the RIGHT upper lobe in the perihilar region, approximately 6.9 x 3.6 x 3.6 cm, appears mass-like on lateral view, question tumor versus infiltrate. Remaining lungs clear. No pleural effusion or pneumothorax. Osseous structures unremarkable. IMPRESSION: Focal RIGHT upper lobe opacity 3.6 x 3.6 x 6.9 cm question mass versus infiltrate;  CT chest with contrast recommended to exclude neoplasm. These results will be called to the ordering clinician or representative by the Radiologist Assistant, and communication documented in the PACS or zVision Dashboard. Electronically Signed   By: Lavonia Dana M.D.   On: 12/12/2019 11:27   CT Chest Wo Contrast  Result Date: 12/12/2019 CLINICAL DATA:  Right upper lobe opacity. EXAM: CT CHEST WITHOUT CONTRAST TECHNIQUE: Multidetector CT imaging of the chest was performed following the standard protocol without IV contrast. COMPARISON:  Radiograph of same day. FINDINGS: Cardiovascular: No evidence of thoracic aortic aneurysm. Normal cardiac size. No pericardial effusion. Mild coronary artery calcifications are noted. Mediastinum/Nodes: Thyroid gland and esophagus are unremarkable. 2.3 cm right paratracheal lymph node is noted concerning for metastatic disease. Evaluation of hilar adenopathy is limited due to the lack of intravenous contrast, but there is probable 2.3 cm right hilar lymph node. Lungs/Pleura: No pneumothorax or pleural effusion is noted. Left lung is clear. 4.6 x 3.4 cm irregular right upper lobe mass is noted consistent with primary malignancy. Upper Abdomen: Small nonobstructive left renal calculus. Musculoskeletal: Expansile destructive lesion is seen involving the lateral portion of the right eighth rib consistent with metastatic disease. IMPRESSION: 4.6 x 3.4 cm irregular right upper lobe mass is noted consistent with primary malignancy. Enlarged right paratracheal and right hilar lymph nodes are noted concerning for metastatic disease. Expansile destructive lesion is seen involving the lateral portion of the right eighth rib consistent with metastatic disease. PET scan is recommended for further evaluation. These results will be called to the ordering clinician or representative by the Radiologist Assistant, and communication documented in the PACS or zVision Dashboard. Electronically Signed    By: Marijo Conception M.D.   On: 12/12/2019 15:33   MR BRAIN W WO CONTRAST  Result Date: 12/20/2019 CLINICAL DATA:  Non-small-cell lung cancer.  Staging. EXAM: MRI HEAD WITHOUT AND WITH CONTRAST TECHNIQUE: Multiplanar, multiecho pulse sequences of the brain and surrounding structures were obtained without and with intravenous contrast. CONTRAST:  64mL GADAVIST GADOBUTROL 1 MMOL/ML IV SOLN COMPARISON:  None. FINDINGS: Brain: Ventricle size and cerebral volume normal. Scattered small white matter hyperintensities bilaterally consistent with chronic microvascular ischemia. Negative  for acute infarct, hemorrhage, mass Normal enhancement following contrast administration. No metastatic deposits. Vascular: Normal arterial flow voids. Skull and upper cervical spine: Negative Sinuses/Orbits: Mild mucosal edema paranasal sinuses. Negative orbit Other: None IMPRESSION: Negative for metastatic disease to the brain Mild chronic microvascular ischemic change in the white matter. No acute abnormality Electronically Signed   By: Franchot Gallo M.D.   On: 12/20/2019 11:33   US Abdomen Complete  Result Date: 11/30/2019 CLINICAL DATA:  Right-sided abdominal pain EXAM: ABDOMEN ULTRASOUND COMPLETE COMPARISON:  None. FINDINGS: Gallbladder: No gallstones or wall thickening visualized. There is no pericholecystic fluid. No sonographic Murphy sign noted by sonographer. Common bile duct: Diameter: 2 mm. No intrahepatic, common hepatic, or common bile duct dilatation. Liver: No focal lesion identified. Liver echogenicity is increased diffusely. Portal vein is patent on color Doppler imaging with normal direction of blood flow towards the liver. IVC: No abnormality visualized. Pancreas: Visualized portion unremarkable. Portions of pancreas obscured by gas. Spleen: Size and appearance within normal limits. Right Kidney: Length: 12.2 cm. Echogenicity within normal limits. No mass or hydronephrosis visualized. Left Kidney: Length: 11.8 cm.  Echogenicity within normal limits. No mass or hydronephrosis visualized. Abdominal aorta: No aneurysm visualized. Other findings: No demonstrable ascites. IMPRESSION: 1. Diffuse increase in liver echogenicity, a finding indicative of hepatic steatosis. No focal liver lesions are evident. It must be cautioned that the sensitivity of ultrasound for detection of focal liver lesions is somewhat diminished in this circumstance. 2. Portions of pancreas obscured by gas. Visualized portions of pancreas appear normal. 3.  Study otherwise unremarkable. Electronically Signed   By: Lowella Grip III M.D.   On: 11/30/2019 12:13   NM PET Image Initial (PI) Skull Base To Thigh  Result Date: 12/20/2019 CLINICAL DATA:  Initial treatment strategy for non-small cell lung cancer. EXAM: NUCLEAR MEDICINE PET SKULL BASE TO THIGH TECHNIQUE: 13.8 mCi F-18 FDG was injected intravenously. Full-ring PET imaging was performed from the skull base to thigh after the radiotracer. CT data was obtained and used for attenuation correction and anatomic localization. Fasting blood glucose: 128 mg/dl COMPARISON:  CT 12/12/2019 FINDINGS: Mediastinal blood pool activity: SUV max 3.2 Liver activity: SUV max NA NECK: Hypermetabolic RIGHT supraclavicular lymph node measures 10 mm (image 63/3) with SUV max equal 7.9. Incidental CT findings: none CHEST: Hypermetabolic mass in the central RIGHT upper lobe extending to the hilum measures 4.9 by 2.8 cm with SUV max equal 17.2. Hypermetabolic RIGHT lower paratracheal lymph node measuring 18 mm with SUV max equal 12.5. Hypermetabolic subcarinal lymph node additionally. Incidental CT findings: none ABDOMEN/PELVIS: No abnormal hypermetabolic activity within the liver, pancreas, adrenal glands, or spleen. No hypermetabolic lymph nodes in the abdomen or pelvis. Incidental CT findings: none SKELETON: Hypermetabolic lesion in the sternum with SUV max equal 8.2. There is a corresponding lytic lesion with central  soft tissue component measuring 16 mm on image 110/3). Skeletal metastasis within the lateral RIGHT eighth rib with central soft tissue expansion measuring 16 mm intense metabolic activity (SUV max equal 20.1. Similar lytic lesion with central soft tissue in the posterior RIGHT acetabulum measuring 2.3 cm with SUV max equal 20.1. Small lesion in the RIGHT eleventh rib at the costovertebral junction. Incidental CT findings: none IMPRESSION: 1. Hypermetabolic mass in the central RIGHT upper lobe consistent primary bronchogenic carcinoma. 2. Hypermetabolic ipsilateral mediastinal nodal metastasis as well as hypermetabolic RIGHT supraclavicular nodal metastasis. 3. Hypermetabolic skeletal metastasis with sternal lesion, RIGHT rib lesion and RIGHT posterior acetabular hypermetabolic lytic lesion. Electronically  Signed   By: Suzy Bouchard M.D.   On: 12/20/2019 12:02    Labs:  CBC: Recent Labs    09/04/19 0955 12/18/19 0710 12/24/19 1400  WBC 10.9* 10.6* 12.4*  HGB 14.2 12.7* 13.1  HCT 43.6 40.3 40.5  PLT 249.0 286 341    COAGS: Recent Labs    12/18/19 0710  INR 1.0  APTT 38*    BMP: Recent Labs    09/04/19 0955 11/20/19 1516 12/18/19 0710 12/24/19 1400  NA 135 140 137 138  K 5.1 4.4 4.2 4.1  CL 99 101 100 99  CO2 30 32 28 31  GLUCOSE 215* 94 140* 147*  BUN 10 11 9 8   CALCIUM 9.3 9.2 9.1 9.4  CREATININE 0.88 0.85 0.83 0.84  GFRNONAA  --   --  >60 >60  GFRAA  --   --  >60 >60    LIVER FUNCTION TESTS: Recent Labs    09/04/19 0955 12/18/19 0710 12/24/19 1400  BILITOT 0.3 0.7 0.3  AST 21 18 15   ALT 34 21 18  ALKPHOS 69 70 96  PROT 6.8 6.7 7.4  ALBUMIN 4.1 3.3* 3.4*     Assessment and Plan:  Stage IV non-small cell (SSC) of right lung with tentative plans to begin systemic chemotherapy for management. Plan for image-guided Port-a-cath placement today in IR. Patient is NPO. Afebrile. He does not take blood thinners.  Risks and benefits of image-guided  Port-a-catheter placement were discussed with the patient including, but not limited to bleeding, infection, pneumothorax, or fibrin sheath development and need for additional procedures. All of the patient's questions were answered, patient is agreeable to proceed. Consent signed and in chart.   Thank you for this interesting consult.  I greatly enjoyed meeting Mike [name] and look forward to participating in their care.  A copy of this report was sent to the requesting provider on this date.  Electronically Signed: Earley Abide, PA-C 12/27/2019, 1:33 PM   I spent a total of 30 Minutes in face to face in clinical consultation, greater than 50% of which was counseling/coordinating care for lung cancer/Port-a-cath placement.

## 2019-12-27 NOTE — Discharge Instructions (Addendum)
Please call Interventional Radiology clinic with any questions or concerns about your port.  DO NOT use EMLA cream for 2 weeks after port placement as this cream will remove surgical glue on your Incision.  You may remove your dressing and shower tomorrow.  Moderate Conscious Sedation, Adult, Care After These instructions provide you with information about caring for yourself after your procedure. Your health care provider may also give you more specific instructions. Your treatment has been planned according to current medical practices, but problems sometimes occur. Call your health care provider if you have any problems or questions after your procedure. What can I expect after the procedure? After your procedure, it is common:  To feel sleepy for several hours.  To feel clumsy and have poor balance for several hours.  To have poor judgment for several hours.  To vomit if you eat too soon. Follow these instructions at home: For at least 24 hours after the procedure:   Do not: ? Participate in activities where you could fall or become injured. ? Drive. ? Use heavy machinery. ? Drink alcohol. ? Take sleeping pills or medicines that cause drowsiness. ? Make important decisions or sign legal documents. ? Take care of children on your own.  Rest. Eating and drinking  Follow the diet recommended by your health care provider.  If you vomit: ? Drink water, juice, or soup when you can drink without vomiting. ? Make sure you have little or no nausea before eating solid foods. General instructions  Have a responsible adult stay with you until you are awake and alert.  Take over-the-counter and prescription medicines only as told by your health care provider.  If you smoke, do not smoke without supervision.  Keep all follow-up visits as told by your health care provider. This is important. Contact a health care provider if:  You keep  feeling nauseous or you keep vomiting.  You feel light-headed.  You develop a rash.  You have a fever. Get help right away if:  You have trouble breathing. This information is not intended to replace advice given to you by your health care provider. Make sure you discuss any questions you have with your health care provider. Document Revised: 09/16/2017 Document Reviewed: 01/24/2016 Elsevier Patient Education  [place] Insertion, Care After This sheet gives you information about how to care for yourself after your procedure. Your health care provider may also give you more specific instructions. If you have problems or questions, contact your health care provider. What can I expect after the procedure? After the procedure, it is common to have:  Discomfort at the port insertion site.  Bruising on the skin over the port. This should improve over 3-4 days. Follow these instructions at home: [facility] care  After your port is placed, you will get a manufacturer's information card. The card has information about your port. Keep this card with you at all times.  Take care of the port as told by your health care provider. Ask your health care provider if you or a family member can get training for taking care of the port at home. A home health care nurse may also take care of the port.  Make sure to remember what type of port you have. Incision care      Follow instructions from your health care provider about how to take care of your port insertion site. Make sure you: ? Wash your hands with soap and water  before and after you change your bandage (dressing). If soap and water are not available, use hand sanitizer. ? Change your dressing as told by your health care provider. ? Leave stitches (sutures), skin glue, or adhesive strips in place. These skin closures may need to stay in place for 2 weeks or longer. If adhesive strip edges start to loosen and curl up,  you may trim the loose edges. Do not remove adhesive strips completely unless your health care provider tells you to do that.  Check your port insertion site every day for signs of infection. Check for: ? Redness, swelling, or pain. ? Fluid or blood. ? Warmth. ? Pus or a bad smell. Activity  Return to your normal activities as told by your health care provider. Ask your health care provider what activities are safe for you.  Do not lift anything that is heavier than 10 lb (4.5 kg), or the limit that you are told, until your health care provider says that it is safe. General instructions  Take over-the-counter and prescription medicines only as told by your health care provider.  Do not take baths, swim, or use a hot tub until your health care provider approves. Ask your health care provider if you may take showers. You may only be allowed to take sponge baths.  Do not drive for 24 hours if you were given a sedative during your procedure.  Wear a medical alert bracelet in case of an emergency. This will tell any health care providers that you have a port.  Keep all follow-up visits as told by your health care provider. This is important. Contact a health care provider if:  You cannot flush your port with saline as directed, or you cannot draw blood from the port.  You have a fever or chills.  You have redness, swelling, or pain around your port insertion site.  You have fluid or blood coming from your port insertion site.  Your port insertion site feels warm to the touch.  You have pus or a bad smell coming from the port insertion site. Get help right away if:  You have chest pain or shortness of breath.  You have bleeding from your port that you cannot control. Summary  Take care of the port as told by your health care provider. Keep the manufacturer's information card with you at all times.  Change your dressing as told by your health care provider.  Contact a health  care provider if you have a fever or chills or if you have redness, swelling, or pain around your port insertion site.  Keep all follow-up visits as told by your health care provider. This information is not intended to replace advice given to you by your health care provider. Make sure you discuss any questions you have with your health care provider. Document Revised: 05/02/2018 Document Reviewed: 05/02/2018 Elsevier Patient Education  [place].

## 2019-12-28 ENCOUNTER — Encounter: Payer: Self-pay | Admitting: Internal Medicine

## 2019-12-28 ENCOUNTER — Other Ambulatory Visit: Payer: Self-pay

## 2019-12-28 ENCOUNTER — Inpatient Hospital Stay: Payer: 59

## 2019-12-28 MED ORDER — PROCHLORPERAZINE MALEATE 10 MG PO TABS: 10.0000 mg | ORAL_TABLET | Freq: Four times a day (QID) | ORAL | 0 refills | Status: DC | PRN

## 2019-12-28 NOTE — Progress Notes (Signed)
Met with patient/other at registration to introduce myself as Arboriculturist and to offer available resources.  Discussed one-time $1000 Radio broadcast assistant to assist with personal expenses while going through treatment.  Also discussed available copay assistance for treatment drugs(Fulphila and Keytruda) if needed.  Gave him my card if interested in applying and for any additional financial questions or concerns.

## 2019-12-31 ENCOUNTER — Other Ambulatory Visit: Payer: Self-pay

## 2019-12-31 ENCOUNTER — Ambulatory Visit
Admission: RE | Admit: 2019-12-31 | Discharge: 2019-12-31 | Disposition: A | Discharge status: Home / Self Care | Payer: 59 | Source: Ambulatory Visit | Attending: Radiation Oncology | Admitting: Radiation Oncology

## 2019-12-31 DIAGNOSIS — C7951 Secondary malignant neoplasm of bone: Secondary | ICD-10-CM | POA: Diagnosis not present

## 2019-12-31 DIAGNOSIS — Z510 Encounter for antineoplastic radiation therapy: Secondary | ICD-10-CM | POA: Insufficient documentation

## 2019-12-31 DIAGNOSIS — C3491 Malignant neoplasm of unspecified part of right bronchus or lung: Secondary | ICD-10-CM | POA: Diagnosis not present

## 2019-12-31 NOTE — Progress Notes (Signed)
  Radiation Oncology         ([phone]) [phone] ________________________________  Name: Mike [name] MRN: [id number]  Date: 01/01/2020  DOB: Mar 30, 1966  Simulation Verification Note    ICD-10-CM   1. Stage IV squamous cell carcinoma of right lung ([place])  C34.91     NARRATIVE: The patient was brought to the treatment unit and placed in the planned treatment position. The clinical setup was verified. Then port films were obtained and uploaded to the radiation oncology medical record software.  The treatment beams were carefully compared against the planned radiation fields. The position location and shape of the radiation fields was reviewed. They targeted volume of tissue appears to be appropriately covered by the radiation beams. Organs at risk appear to be excluded as planned.  Based on my personal review, I approved the simulation verification. The patient's treatment will proceed as planned.  -----------------------------------  Blair Promise, PhD, MD  This document serves as a record of services personally performed by Gery Pray, MD. It was created on his behalf by Clerance Lav, a trained medical scribe. The creation of this record is based on the scribe's personal observations and the provider's statements to them. This document has been checked and approved by the attending provider.

## 2019-12-31 NOTE — Progress Notes (Signed)
  Radiation Oncology         (336) [phone] ________________________________  Name: Mike [name] MRN: [id number]  Date: 12/31/2019  DOB: October 16, 1966  SIMULATION AND TREATMENT PLANNING NOTE    ICD-10-CM   1. Stage IV squamous cell carcinoma of right lung (HCC)  C34.91   2. Bony metastasis (HCC)  C79.51     DIAGNOSIS: Stage IV (T2b, N3, M1C) non-small cell lung cancer, squamous cell carcinoma presented with right upper lobe lung mass in addition to mediastinal and right supraclavicular lymphadenopathy as well as multiple metastatic bone lesions  NARRATIVE:  The patient was brought to the [facility].  Identity was confirmed.  All relevant records and images related to the planned course of therapy were reviewed.  The patient freely provided informed written consent to proceed with treatment after reviewing the details related to the planned course of therapy. The consent form was witnessed and verified by the simulation staff.  Then, the patient was set-up in a stable reproducible supine position for radiation therapy.  CT images were obtained.  Surface markings were placed.  The CT images were loaded into the planning software.  Then the target and avoidance structures were contoured.  Treatment planning then occurred.  The radiation prescription was entered and confirmed.  Then, I designed and supervised the construction of a total of 8 medically necessary complex treatment devices.  I have requested : 3D Simulation  I have requested a DVH of the following structures: PTV, GTV, heart lungs, .  I have ordered:dose calc.  PLAN:  The patient will receive 30 Gy in 10 fractions directed at 3 different areas including the sternum, right rib cage area and right pelvis area as noted  being active on the PET scan and causing pain for the patient.   -----------------------------------  Blair Promise, PhD, MD  This document serves as a record of services personally performed by Gery Pray, MD. It was created on his behalf by Clerance Lav, a trained medical scribe. The creation of this record is based on the scribe's personal observations and the provider's statements to them. This document has been checked and approved by the attending provider.

## 2020-01-01 ENCOUNTER — Ambulatory Visit
Admission: RE | Admit: 2020-01-01 | Discharge: 2020-01-01 | Disposition: A | Discharge status: Home / Self Care | Payer: 59 | Source: Ambulatory Visit | Attending: Radiation Oncology | Admitting: Radiation Oncology

## 2020-01-01 ENCOUNTER — Other Ambulatory Visit: Payer: Self-pay

## 2020-01-01 ENCOUNTER — Inpatient Hospital Stay: Payer: 59

## 2020-01-01 VITALS — BP 110/68 | HR 71 | Temp 98.1°F | Resp 16

## 2020-01-01 DIAGNOSIS — C3491 Malignant neoplasm of unspecified part of right bronchus or lung: Secondary | ICD-10-CM

## 2020-01-01 DIAGNOSIS — Z95828 Presence of other vascular implants and grafts: Secondary | ICD-10-CM

## 2020-01-01 DIAGNOSIS — Z510 Encounter for antineoplastic radiation therapy: Secondary | ICD-10-CM | POA: Diagnosis not present

## 2020-01-01 LAB — CMP (CANCER CENTER ONLY)
ALT: 17 U/L (ref 0–44)
AST: 17 U/L (ref 15–41)
Albumin: 3.1 g/dL — ABNORMAL LOW (ref 3.5–5.0)
Alkaline Phosphatase: 93 U/L (ref 38–126)
Anion gap: 9 (ref 5–15)
BUN: 11 mg/dL (ref 6–20)
CO2: 28 mmol/L (ref 22–32)
Calcium: 9.1 mg/dL (ref 8.9–10.3)
Chloride: 98 mmol/L (ref 98–111)
Creatinine: 0.83 mg/dL (ref 0.61–1.24)
GFR, Est AFR Am: >60 mL/min (ref >60)
GFR, Estimated: >60 mL/min (ref >60)
Glucose, Bld: 170 mg/dL — ABNORMAL HIGH (ref 70–99)
Potassium: 4.4 mmol/L (ref 3.5–5.1)
Sodium: 135 mmol/L (ref 135–145)
Total Bilirubin: 0.3 mg/dL (ref 0.3–1.2)
Total Protein: 7.2 g/dL (ref 6.5–8.1)

## 2020-01-01 LAB — CBC WITH DIFFERENTIAL (CANCER CENTER ONLY)
Abs Immature Granulocytes: 0.04 10*3/uL (ref 0.00–0.07)
Basophils Absolute: 0.1 10*3/uL (ref 0.0–0.1)
Basophils Relative: 1 %
Eosinophils Absolute: 0.1 10*3/uL (ref 0.0–0.5)
Eosinophils Relative: 1 %
HCT: 39.1 % (ref 39.0–52.0)
Hemoglobin: 12.6 g/dL — ABNORMAL LOW (ref 13.0–17.0)
Immature Granulocytes: 0 %
Lymphocytes Relative: 22 %
Lymphs Abs: 2.3 10*3/uL (ref 0.7–4.0)
MCH: 29.8 pg (ref 26.0–34.0)
MCHC: 32.2 g/dL (ref 30.0–36.0)
MCV: 92.4 fL (ref 80.0–100.0)
Monocytes Absolute: 1 10*3/uL (ref 0.1–1.0)
Monocytes Relative: 10 %
Neutro Abs: 7 10*3/uL (ref 1.7–7.7)
Neutrophils Relative %: 66 %
Platelet Count: 311 10*3/uL (ref 150–400)
RBC: 4.23 MIL/uL (ref 4.22–5.81)
RDW: 13.2 % (ref 11.5–15.5)
WBC Count: 10.4 10*3/uL (ref 4.0–10.5)
nRBC: 0 % (ref 0.0–0.2)

## 2020-01-01 LAB — TSH: TSH: 0.997 u[IU]/mL (ref 0.320–4.118)

## 2020-01-01 MED ORDER — DIPHENHYDRAMINE HCL 50 MG/ML IJ SOLN
INTRAMUSCULAR | Status: AC
  Filled 2020-01-01: qty 1

## 2020-01-01 MED ORDER — DIPHENHYDRAMINE HCL 50 MG/ML IJ SOLN
50.0000 mg | Freq: Once | INTRAMUSCULAR | Status: AC
  Administered 2020-01-01: 50 mg via INTRAVENOUS

## 2020-01-01 MED ORDER — SODIUM CHLORIDE 0.9 % IV SOLN
750.0000 mg | Freq: Once | INTRAVENOUS | Status: AC
  Administered 2020-01-01: 14:00:00 750 mg via INTRAVENOUS
  Filled 2020-01-01: qty 75

## 2020-01-01 MED ORDER — SODIUM CHLORIDE 0.9 % IV SOLN
200.0000 mg | Freq: Once | INTRAVENOUS | Status: AC
  Administered 2020-01-01: 200 mg via INTRAVENOUS
  Filled 2020-01-01: qty 8

## 2020-01-01 MED ORDER — SODIUM CHLORIDE 0.9 % IV SOLN
150.0000 mg | Freq: Once | INTRAVENOUS | Status: AC
  Administered 2020-01-01: 10:00:00 150 mg via INTRAVENOUS
  Filled 2020-01-01: qty 150

## 2020-01-01 MED ORDER — OXYCODONE HCL 10 MG PO TABS: 10.0000 mg | ORAL_TABLET | ORAL | 0 refills | Status: DC | PRN

## 2020-01-01 MED ORDER — HEPARIN SOD (PORK) LOCK FLUSH 100 UNIT/ML IV SOLN
500.0000 [IU] | Freq: Once | INTRAVENOUS | Status: AC | PRN
  Administered 2020-01-01: 500 [IU]
  Filled 2020-01-01: qty 5

## 2020-01-01 MED ORDER — PALONOSETRON HCL INJECTION 0.25 MG/5ML
0.2500 mg | Freq: Once | INTRAVENOUS | Status: AC
  Administered 2020-01-01: 09:00:00 0.25 mg via INTRAVENOUS

## 2020-01-01 MED ORDER — SODIUM CHLORIDE 0.9 % IV SOLN
175.0000 mg/m2 | Freq: Once | INTRAVENOUS | Status: AC
  Administered 2020-01-01: 11:00:00 438 mg via INTRAVENOUS
  Filled 2020-01-01: qty 73

## 2020-01-01 MED ORDER — FAMOTIDINE IN NACL 20-0.9 MG/50ML-% IV SOLN
INTRAVENOUS | Status: AC
  Filled 2020-01-01: qty 50

## 2020-01-01 MED ORDER — SODIUM CHLORIDE 0.9 % IV SOLN
10.0000 mg | Freq: Once | INTRAVENOUS | Status: AC
  Administered 2020-01-01: 09:00:00 10 mg via INTRAVENOUS
  Filled 2020-01-01: qty 1

## 2020-01-01 MED ORDER — SODIUM CHLORIDE 0.9% FLUSH
10.0000 mL | INTRAVENOUS | Status: DC | PRN
  Administered 2020-01-01: 15:00:00 10 mL
  Filled 2020-01-01: qty 10

## 2020-01-01 MED ORDER — SODIUM CHLORIDE 0.9 % IV SOLN
Freq: Once | INTRAVENOUS | Status: AC
  Filled 2020-01-01: qty 250

## 2020-01-01 MED ORDER — SODIUM CHLORIDE 0.9% FLUSH
10.0000 mL | Freq: Once | INTRAVENOUS | Status: AC
  Administered 2020-01-01: 10 mL via INTRAVENOUS
  Filled 2020-01-01: qty 10

## 2020-01-01 MED ORDER — DEXAMETHASONE SODIUM PHOSPHATE 10 MG/ML IJ SOLN
INTRAMUSCULAR | Status: AC
  Filled 2020-01-01: qty 1

## 2020-01-01 MED ORDER — PALONOSETRON HCL INJECTION 0.25 MG/5ML
INTRAVENOUS | Status: AC
  Filled 2020-01-01: qty 5

## 2020-01-01 MED ORDER — FAMOTIDINE IN NACL 20-0.9 MG/50ML-% IV SOLN
20.0000 mg | Freq: Once | INTRAVENOUS | Status: AC
  Administered 2020-01-01: 20 mg via INTRAVENOUS

## 2020-01-01 NOTE — Patient Instructions (Signed)
[facility] Discharge Instructions for Patients Receiving Chemotherapy  Today you received the following chemotherapy agents keytruda/paclitaxel/carboplatin   To help prevent nausea and vomiting after your treatment, we encourage you to take your nausea medication as directed   If you develop nausea and vomiting that is not controlled by your nausea medication, call the clinic.   BELOW ARE SYMPTOMS THAT SHOULD BE REPORTED IMMEDIATELY:  *FEVER GREATER THAN 100.5 F  *CHILLS WITH OR WITHOUT FEVER  NAUSEA AND VOMITING THAT IS NOT CONTROLLED WITH YOUR NAUSEA MEDICATION  *UNUSUAL SHORTNESS OF BREATH  *UNUSUAL BRUISING OR BLEEDING  TENDERNESS IN MOUTH AND THROAT WITH OR WITHOUT PRESENCE OF ULCERS  *URINARY PROBLEMS  *BOWEL PROBLEMS  UNUSUAL RASH Items with * indicate a potential emergency and should be followed up as soon as possible.  Feel free to call the clinic you have any questions or concerns. The clinic phone number is [phone].

## 2020-01-02 ENCOUNTER — Other Ambulatory Visit: Payer: Self-pay

## 2020-01-02 ENCOUNTER — Ambulatory Visit
Admission: RE | Admit: 2020-01-02 | Discharge: 2020-01-02 | Disposition: A | Discharge status: Home / Self Care | Payer: 59 | Source: Ambulatory Visit | Attending: Radiation Oncology | Admitting: Radiation Oncology

## 2020-01-02 DIAGNOSIS — Z510 Encounter for antineoplastic radiation therapy: Secondary | ICD-10-CM | POA: Diagnosis not present

## 2020-01-03 ENCOUNTER — Other Ambulatory Visit: Payer: Self-pay

## 2020-01-03 ENCOUNTER — Ambulatory Visit
Admission: RE | Admit: 2020-01-03 | Discharge: 2020-01-03 | Disposition: A | Discharge status: Home / Self Care | Payer: 59 | Source: Ambulatory Visit | Attending: Radiation Oncology | Admitting: Radiation Oncology

## 2020-01-03 DIAGNOSIS — Z510 Encounter for antineoplastic radiation therapy: Secondary | ICD-10-CM | POA: Diagnosis not present

## 2020-01-04 ENCOUNTER — Ambulatory Visit
Admission: RE | Admit: 2020-01-04 | Discharge: 2020-01-04 | Disposition: A | Discharge status: Home / Self Care | Payer: 59 | Source: Ambulatory Visit | Attending: Radiation Oncology | Admitting: Radiation Oncology

## 2020-01-04 ENCOUNTER — Other Ambulatory Visit: Payer: Self-pay

## 2020-01-04 ENCOUNTER — Telehealth: Payer: Self-pay | Admitting: Family Medicine

## 2020-01-04 ENCOUNTER — Telehealth: Payer: Self-pay | Admitting: Medical Oncology

## 2020-01-04 DIAGNOSIS — Z510 Encounter for antineoplastic radiation therapy: Secondary | ICD-10-CM | POA: Diagnosis not present

## 2020-01-04 NOTE — Telephone Encounter (Signed)
Called number on the  on call report and no answer . ILVM to return my call.

## 2020-01-04 NOTE — Telephone Encounter (Signed)
Left message asking pt to call office regarding fmla  See robin

## 2020-01-04 NOTE — Telephone Encounter (Signed)
Pt is feeling better today -no chills or clamminess.He is getting radiation today. I instructed her to to take him to hospital or call 911 if pt symptoms recur and has any SOB , chest pain. She voiced understanding.

## 2020-01-07 ENCOUNTER — Ambulatory Visit
Admission: RE | Admit: 2020-01-07 | Discharge: 2020-01-07 | Disposition: A | Discharge status: Home / Self Care | Payer: 59 | Source: Ambulatory Visit | Attending: Radiation Oncology | Admitting: Radiation Oncology

## 2020-01-07 ENCOUNTER — Other Ambulatory Visit: Payer: Self-pay

## 2020-01-07 DIAGNOSIS — Z510 Encounter for antineoplastic radiation therapy: Secondary | ICD-10-CM | POA: Diagnosis not present

## 2020-01-07 NOTE — Progress Notes (Signed)
[facility] OFFICE PROGRESS NOTE  [name], MD [place] [address]  DIAGNOSIS: stage IV (T2b, N3, M1C) non-small cell lung cancer, squamous cell carcinoma presented with right upper lobe lung mass in addition to mediastinal and right supraclavicular lymphadenopathy as well as multiple metastatic bone lesions diagnosed in March 2021.  PRIOR THERAPY: None  CURRENT THERAPY: 1) Palliative systemic chemotherapy with carboplatin for AUC of 5, paclitaxel 175 NG/M2 and Keytruda 200 mg IV every 3 weeks with Neulasta support. First dose 01/03/20 2) Palliative radiotherapy to the painful metastatic bone lesions under the care of Dr. Sondra Come. Last dose expected 01/14/20  INTERVAL HISTORY: Mike [name] 54 y.o. male returns to the clinic for a follow up visit accompanied by his wife. The patient is feeling fairly well today without any concerning complaints except for an episode on 3/19 of chills, clamminess, and white lips. His symptoms lasted 2 hours. He has diabetes and his blood sugar was repordedly in the 200's. He called EMS. By the time they arrived, his symptoms resolved. His oxygen saturation was reportedly 90%. He did not go to the ER. The patient states he has felt well since this time but his wife believes he has been clammy to the touch. He denies any sore throat, skin infections, or dysuria. He reports his baseline cough which produces white sputum and his baseline dyspnea on exertion which he states is "not that bad". The patient received his first cycle of chemotherapy last week and tolerated it well except for mildly sore gums without any ulcerations. He did not receive his neulasta injection due to possibly a scheduling issue. He denies any fevers or weight loss. He reports an episode of night sweats since being diagnosed with cancer. He denies nausea, vomiting, diarrhea, or constipation. He denies headaches or visual changes. He reports his baseline  shortness of breath, cough, and wheezing. He feels more short of breath at night time. He has an appointment with pulmonology tomorrow. He does not take any inhalers. Denies hemoptysis. He has some chest discomfort described as a pulling sensation or congestion when he lays down. Denies cardiac history. He does have a metastatic bone lesion to the sternum and is tender in this region as well as along the right and left lateral ribs. He is currently undergoing palliative radiation to these metastatic bone lesions. His pain is significantly improved at this time. He has a prescription for hydrocodone if needed for pain. He is here for repeat lab work and a 1 week follow up visit after completing his first cycle of chemotherapy.      MEDICAL HISTORY: Past Medical History:  Diagnosis Date  . Arthritis    neck  . Diabetes ([place])   . Fatty liver 12/05/2019   By [place] 11/2019  . GERD (gastroesophageal reflux disease)   . 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 High cholesterol   . IBS (irritable bowel syndrome)   . OSA (obstructive sleep apnea) 09/15/2019   Sleep study 12/2017 - severe OSA with AHI 65.6, desat to 76% rec CPAP  . Severe obesity (BMI 35.0-39.9) with comorbidity ([place]) 09/04/2019    ALLERGIES:  has No Known Allergies.  MEDICATIONS:  Current Outpatient Medications  Medication Sig Dispense Refill  . aspirin EC 81 MG tablet Take 1 tablet (81 mg total) by mouth daily.    [name] atorvastatin (LIPITOR) 20 MG tablet Take 1 tablet (20 mg total) by mouth daily. (  Patient taking differently: Take 10 mg by mouth in the morning and at bedtime. ) 90 tablet 3  . diazepam (VALIUM) 5 MG tablet TAKE 1 TABLET (5 MG TOTAL) BY MOUTH DAILY AS NEEDED FOR MUSCLE SPASMS. 30 tablet 0  . lidocaine-prilocaine (EMLA) cream Apply to the Port-A-Cath site 30 minutes before treatment 30 g 0  . metFORMIN (GLUCOPHAGE) 1000 MG tablet Take 0.5 tablets (500 mg total) by mouth 2 (two) times daily with a meal. 180 tablet 3   . omeprazole (PRILOSEC) 40 MG capsule Take 1 capsule (40 mg total) by mouth daily. For 3 weeks then as needed (Patient taking differently: Take 40 mg by mouth daily as needed (acid reflux). ) 30 capsule 3  . Oxycodone HCl 10 MG TABS Take 1 tablet (10 mg total) by mouth every 4 (four) hours as needed (severe pain). 45 tablet 0  . prochlorperazine (COMPAZINE) 10 MG tablet Take 1 tablet (10 mg total) by mouth every 6 (six) hours as needed for nausea or vomiting. 30 tablet 0  . prochlorperazine (COMPAZINE) 25 MG suppository Place 1 suppository (25 mg total) rectally every 12 (twelve) hours as needed for nausea or vomiting. 12 suppository 0  . sildenafil (REVATIO) 20 MG tablet Take 20-100 mg by mouth daily as needed (ED).     [name] HYDROcodone-acetaminophen (NORCO/VICODIN) 5-325 MG tablet Take 1 tablet by mouth every 6 (six) hours as needed for moderate pain. (Patient not taking: Reported on 01/08/2020) 30 tablet 0   No current facility-administered medications for this visit.    SURGICAL HISTORY:  Past Surgical History:  Procedure Laterality Date  . BIOPSY  12/18/2019   Procedure: BIOPSY;  Surgeon: Garner Nash, DO;  Location: [facility];  Service: Pulmonary;;  . BRONCHIAL BRUSHINGS  12/18/2019   Procedure: BRONCHIAL BRUSHINGS;  Surgeon: Garner Nash, DO;  Location: [facility];  Service: Pulmonary;;  . BRONCHIAL WASHINGS  12/18/2019   Procedure: BRONCHIAL WASHINGS;  Surgeon: Garner Nash, DO;  Location: [facility];  Service: Pulmonary;;  . COLONOSCOPY    . CYSTECTOMY     knee- left knee cyst  . ENDOBRONCHIAL ULTRASOUND  12/18/2019   Procedure: ENDOBRONCHIAL ULTRASOUND;  Surgeon: Garner Nash, DO;  Location: [facility];  Service: Pulmonary;;  . FINE NEEDLE ASPIRATION  12/18/2019   Procedure: FINE NEEDLE ASPIRATION;  Surgeon: Garner Nash, DO;  Location: [facility];  Service: Pulmonary;;  . IR IMAGING GUIDED PORT INSERTION  12/27/2019  . PILONIDAL CYST EXCISION    . VIDEO  BRONCHOSCOPY WITH ENDOBRONCHIAL ULTRASOUND Right 12/18/2019   Procedure: VIDEO BRONCHOSCOPY;  Surgeon: Garner Nash, DO;  Location: [facility];  Service: Pulmonary;  Laterality: Right;    REVIEW OF SYSTEMS:   Review of Systems  Constitutional: Positive for chills 4 days ago. Negative for appetite change, fatigue, fever and unexpected weight change.  HENT: Positive for mildly sore gums. Negative for mouth sores, nosebleeds, sore throat and trouble swallowing.   Eyes: Negative for eye problems and icterus.  Respiratory: Positive for baseline cough, wheezing, and shortness of breath with exertion. Negative for hemoptysis.  Cardiovascular: Negative for chest pain and leg swelling.  Gastrointestinal: Negative for abdominal pain, constipation, diarrhea, nausea and vomiting.  Genitourinary: Negative for bladder incontinence, difficulty urinating, dysuria, frequency and hematuria.   Musculoskeletal: Negative for back pain, gait problem, neck pain and neck stiffness.  Skin: Negative for itching and rash.  Neurological: Negative for dizziness, extremity weakness, gait problem, headaches, light-headedness and seizures.  Hematological: Negative for  adenopathy. Does not bruise/bleed easily.  Psychiatric/Behavioral: Negative for confusion, depression and sleep disturbance. The patient is not nervous/anxious.     PHYSICAL EXAMINATION:  Blood pressure (!) 141/77, pulse 86, temperature 98.2 F (36.8 C), temperature source Temporal, resp. rate 18, height 5\' 11"  (1.803 m), weight 261 lb 6.4 oz (118.6 kg), SpO2 99 %.  ECOG PERFORMANCE STATUS: 1 - Symptomatic but completely ambulatory  Physical Exam  Constitutional: Oriented to person, place, and time and well-developed, well-nourished, and in no distress.  HENT:  Head: Normocephalic and atraumatic.  Mouth/Throat: Oropharynx is clear and moist. No oropharyngeal exudate.  Eyes: Conjunctivae are normal. Right eye exhibits no discharge. Left eye exhibits  no discharge. No scleral icterus.  Neck: Normal range of motion. Neck supple.  Cardiovascular: Normal rate, regular rhythm, normal heart sounds and intact distal pulses.   Pulmonary/Chest: Effort normal and breath sounds normal. Positive for wheezing. No respiratory distress. No rales.  Abdominal: Soft. Bowel sounds are normal. Exhibits no distension and no mass. There is no tenderness.  Musculoskeletal: Normal range of motion. Exhibits no edema.  Lymphadenopathy:    No cervical adenopathy.  Neurological: Alert and oriented to person, place, and time. Exhibits normal muscle tone. Gait normal. Coordination normal.  Skin: Skin is warm and dry. No rash noted. Not diaphoretic. No erythema. No pallor.  Psychiatric: Mood, memory and judgment normal.  Vitals reviewed.  LABORATORY DATA: Lab Results  Component Value Date   WBC 5.1 01/08/2020   HGB 11.4 (L) 01/08/2020   HCT 35.4 (L) 01/08/2020   MCV 90.1 01/08/2020   PLT 243 01/08/2020      Chemistry      Component Value Date/Time   NA 139 01/08/2020 0925   K 4.3 01/08/2020 0925   CL 101 01/08/2020 0925   CO2 28 01/08/2020 0925   BUN 8 01/08/2020 0925   CREATININE 0.72 01/08/2020 0925      Component Value Date/Time   CALCIUM 8.8 (L) 01/08/2020 0925   ALKPHOS 83 01/08/2020 0925   AST 19 01/08/2020 0925   ALT 20 01/08/2020 0925   BILITOT 0.2 (L) 01/08/2020 0925       RADIOGRAPHIC STUDIES:  DG Chest 2 View  Result Date: 12/12/2019 CLINICAL DATA:  RIGHT-side rib pain, wheezing on exam question pneumonia, rib injury, history diabetes mellitus, GERD, smoker with 40 pack-year smoking history EXAM: CHEST - 2 VIEW COMPARISON:  None FINDINGS: Normal heart size, mediastinal contours, and pulmonary vascularity. Focal opacity identified in the RIGHT upper lobe in the perihilar region, approximately 6.9 x 3.6 x 3.6 cm, appears mass-like on lateral view, question tumor versus infiltrate. Remaining lungs clear. No pleural effusion or  pneumothorax. Osseous structures unremarkable. IMPRESSION: Focal RIGHT upper lobe opacity 3.6 x 3.6 x 6.9 cm question mass versus infiltrate; CT chest with contrast recommended to exclude neoplasm. These results will be called to the ordering clinician or representative by the Radiologist Assistant, and communication documented in the PACS or zVision Dashboard. Electronically Signed   By: Lavonia Dana M.D.   On: 12/12/2019 11:27   CT Chest Wo Contrast  Result Date: 12/12/2019 CLINICAL DATA:  Right upper lobe opacity. EXAM: CT CHEST WITHOUT CONTRAST TECHNIQUE: Multidetector CT imaging of the chest was performed following the standard protocol without IV contrast. COMPARISON:  Radiograph of same day. FINDINGS: Cardiovascular: No evidence of thoracic aortic aneurysm. Normal cardiac size. No pericardial effusion. Mild coronary artery calcifications are noted. Mediastinum/Nodes: Thyroid gland and esophagus are unremarkable. 2.3 cm right paratracheal lymph node  is noted concerning for metastatic disease. Evaluation of hilar adenopathy is limited due to the lack of intravenous contrast, but there is probable 2.3 cm right hilar lymph node. Lungs/Pleura: No pneumothorax or pleural effusion is noted. Left lung is clear. 4.6 x 3.4 cm irregular right upper lobe mass is noted consistent with primary malignancy. Upper Abdomen: Small nonobstructive left renal calculus. Musculoskeletal: Expansile destructive lesion is seen involving the lateral portion of the right eighth rib consistent with metastatic disease. IMPRESSION: 4.6 x 3.4 cm irregular right upper lobe mass is noted consistent with primary malignancy. Enlarged right paratracheal and right hilar lymph nodes are noted concerning for metastatic disease. Expansile destructive lesion is seen involving the lateral portion of the right eighth rib consistent with metastatic disease. PET scan is recommended for further evaluation. These results will be called to the ordering  clinician or representative by the Radiologist Assistant, and communication documented in the PACS or zVision Dashboard. Electronically Signed   By: Marijo Conception M.D.   On: 12/12/2019 15:33   MR BRAIN W WO CONTRAST  Result Date: 12/20/2019 CLINICAL DATA:  Non-small-cell lung cancer.  Staging. EXAM: MRI HEAD WITHOUT AND WITH CONTRAST TECHNIQUE: Multiplanar, multiecho pulse sequences of the brain and surrounding structures were obtained without and with intravenous contrast. CONTRAST:  104mL GADAVIST GADOBUTROL 1 MMOL/ML IV SOLN COMPARISON:  None. FINDINGS: Brain: Ventricle size and cerebral volume normal. Scattered small white matter hyperintensities bilaterally consistent with chronic microvascular ischemia. Negative for acute infarct, hemorrhage, mass Normal enhancement following contrast administration. No metastatic deposits. Vascular: Normal arterial flow voids. Skull and upper cervical spine: Negative Sinuses/Orbits: Mild mucosal edema paranasal sinuses. Negative orbit Other: None IMPRESSION: Negative for metastatic disease to the brain Mild chronic microvascular ischemic change in the white matter. No acute abnormality Electronically Signed   By: Franchot Gallo M.D.   On: 12/20/2019 11:33   NM PET Image Initial (PI) Skull Base To Thigh  Result Date: 12/20/2019 CLINICAL DATA:  Initial treatment strategy for non-small cell lung cancer. EXAM: NUCLEAR MEDICINE PET SKULL BASE TO THIGH TECHNIQUE: 13.8 mCi F-18 FDG was injected intravenously. Full-ring PET imaging was performed from the skull base to thigh after the radiotracer. CT data was obtained and used for attenuation correction and anatomic localization. Fasting blood glucose: 128 mg/dl COMPARISON:  CT 12/12/2019 FINDINGS: Mediastinal blood pool activity: SUV max 3.2 Liver activity: SUV max NA NECK: Hypermetabolic RIGHT supraclavicular lymph node measures 10 mm (image 63/3) with SUV max equal 7.9. Incidental CT findings: none CHEST: Hypermetabolic mass  in the central RIGHT upper lobe extending to the hilum measures 4.9 by 2.8 cm with SUV max equal 17.2. Hypermetabolic RIGHT lower paratracheal lymph node measuring 18 mm with SUV max equal 12.5. Hypermetabolic subcarinal lymph node additionally. Incidental CT findings: none ABDOMEN/PELVIS: No abnormal hypermetabolic activity within the liver, pancreas, adrenal glands, or spleen. No hypermetabolic lymph nodes in the abdomen or pelvis. Incidental CT findings: none SKELETON: Hypermetabolic lesion in the sternum with SUV max equal 8.2. There is a corresponding lytic lesion with central soft tissue component measuring 16 mm on image 110/3). Skeletal metastasis within the lateral RIGHT eighth rib with central soft tissue expansion measuring 16 mm intense metabolic activity (SUV max equal 20.1. Similar lytic lesion with central soft tissue in the posterior RIGHT acetabulum measuring 2.3 cm with SUV max equal 20.1. Small lesion in the RIGHT eleventh rib at the costovertebral junction. Incidental CT findings: none IMPRESSION: 1. Hypermetabolic mass in the central RIGHT upper lobe  consistent primary bronchogenic carcinoma. 2. Hypermetabolic ipsilateral mediastinal nodal metastasis as well as hypermetabolic RIGHT supraclavicular nodal metastasis. 3. Hypermetabolic skeletal metastasis with sternal lesion, RIGHT rib lesion and RIGHT posterior acetabular hypermetabolic lytic lesion. Electronically Signed   By: Suzy Bouchard M.D.   On: 12/20/2019 12:02   IR IMAGING GUIDED PORT INSERTION  Result Date: 12/27/2019 CLINICAL DATA:  Metastatic squamous carcinoma of the right lung and need for porta cath to begin chemotherapy. EXAM: IMPLANTED PORT A CATH PLACEMENT WITH ULTRASOUND AND FLUOROSCOPIC GUIDANCE ANESTHESIA/SEDATION: 4.0 mg IV Versed; 1 Hunt mcg IV Fentanyl Total Moderate Sedation Time:  35 minutes The patient's level of consciousness and physiologic status were continuously monitored during the procedure by Radiology  nursing. Additional Medications: 2 g IV Ancef. FLUOROSCOPY TIME:  1 minutes and 30 seconds.  50.6 mGy. PROCEDURE: The procedure, risks, benefits, and alternatives were explained to the patient. Questions regarding the procedure were encouraged and answered. The patient understands and consents to the procedure. A time-out was performed prior to initiating the procedure. Ultrasound was utilized to confirm patency of the left internal jugular vein. The left neck and chest were prepped with chlorhexidine in a sterile fashion, and a sterile drape was applied covering the operative field. Maximum barrier sterile technique with sterile gowns and gloves were used for the procedure. Local anesthesia was provided with 1% lidocaine. After creating a small venotomy incision, a 21 gauge needle was advanced into the left internal jugular vein under direct, real-time ultrasound guidance. Ultrasound image documentation was performed. After securing guidewire access, an 8 Fr dilator was placed. A J-wire was kinked to measure appropriate catheter length. A subcutaneous port pocket was then created along the upper chest wall utilizing sharp and blunt dissection. Portable cautery was utilized. The pocket was irrigated with sterile saline. A single lumen power injectable port was chosen for placement. The 8 Fr catheter was tunneled from the port pocket site to the venotomy incision. The port was placed in the pocket. External catheter was trimmed to appropriate length based on guidewire measurement. At the venotomy, an 8 Fr peel-away sheath was placed over a guidewire. The catheter was then placed through the sheath and the sheath removed. Final catheter positioning was confirmed and documented with a fluoroscopic spot image. The port was accessed with a needle and aspirated and flushed with heparinized saline. The access needle was removed. The venotomy and port pocket incisions were closed with subcutaneous 3-0 Monocryl and  subcuticular 4-0 Vicryl. Dermabond was applied to both incisions. COMPLICATIONS: COMPLICATIONS None FINDINGS: After catheter placement, the tip lies at the cavo-atrial junction. The catheter aspirates normally and is ready for immediate use. IMPRESSION: Placement of single lumen port a cath via left internal jugular vein. The catheter tip lies at the cavo-atrial junction. A power injectable port a cath was placed and is ready for immediate use. Electronically Signed   By: Aletta Edouard M.D.   On: 12/27/2019 16:57     ASSESSMENT/PLAN:  This is a very pleasant 54 year old Caucasian  male recently diagnosed with a stage IV (T2b, N3, M1C) non-small cell lung cancer, squamous cell carcinoma presented with right upper lobe lung mass in addition to mediastinal and right supraclavicular lymphadenopathy as well as multiple metastatic bone lesions diagnosed in March 2021.  The patient is currently undergoing palliative radiotherapy to the painful bone lesions under the care of Dr. Sondra Come. Last treatment expected on 01/14/20.   The patient is currently undergoing palliative systemic chemotherapy with carboplatin for  an AUC of 5, paclitaxel 175 mg/m, and Keytruda 200 mg IV every 3 weeks with Neulasta support. He is status post 1 cycle. He tolerated it well except except for mildly sore gums.   The patient was seen with Dr. Julien Nordmann. Labs were reviewed. The patient missed his Neulasta injection. His WBC is WNL today. We will continue to monitor this closely on routine labs. Neutropenic precautions were reviewed.   Dr. Julien Nordmann recommends that he continue on the same treatment that same dose.  We will see the patient back for follow-up visit in 2 weeks for evaluation before starting cycle #2.  He was encouraged to rinse his mouth with biotene and salt water rinses.   The patient was advised to call immediately if he has any concerning symptoms in the interval. The patient voices understanding of current  disease status and treatment options and is in agreement with the current care plan. All questions were answered. The patient knows to call the clinic with any problems, questions or concerns. We can certainly see the patient much sooner if necessary   No orders of the defined types were placed in this encounter.    Hebe Merriwether L Dilcia Rybarczyk, PA-C 01/08/20  ADDENDUM: Hematology/Oncology Attending: I had a face-to-face encounter with the patient today.  I recommended his care plan.  This is a very pleasant 54 years old white male recently diagnosed with stage IV non-small cell lung cancer, squamous cell carcinoma status post a course of palliative radiotherapy to the painful bone lesion under the care of Dr. Sondra Come.  The patient is currently undergoing systemic chemotherapy with carboplatin, paclitaxel and Keytruda status post 1 week of treatment.  He tolerated the first week of treatment fairly well except for mildly sore gums and fatigue. I recommended for the patient to continue his treatment as planned and he is expected to start cycle #2 in 2 weeks. The patient was advised to call immediately if he has any concerning symptoms in the interval.  Disclaimer: This note was dictated with voice recognition software. Similar sounding words can inadvertently be transcribed and may be missed upon review. Eilleen Kempf, MD 01/08/20

## 2020-01-07 NOTE — Telephone Encounter (Signed)
FMLA paperwork in dr g in box for review and signature   Spoke with pt he wanted continuous leave starting 01/07/20

## 2020-01-08 ENCOUNTER — Inpatient Hospital Stay: Payer: 59

## 2020-01-08 ENCOUNTER — Inpatient Hospital Stay (HOSPITAL_BASED_OUTPATIENT_CLINIC_OR_DEPARTMENT_OTHER): Payer: 59 | Admitting: Physician Assistant

## 2020-01-08 ENCOUNTER — Encounter: Payer: Self-pay | Admitting: Physician Assistant

## 2020-01-08 ENCOUNTER — Other Ambulatory Visit: Payer: Self-pay

## 2020-01-08 ENCOUNTER — Other Ambulatory Visit: Payer: Self-pay | Admitting: Radiation Oncology

## 2020-01-08 ENCOUNTER — Ambulatory Visit
Admission: RE | Admit: 2020-01-08 | Discharge: 2020-01-08 | Disposition: A | Discharge status: Home / Self Care | Payer: 59 | Source: Ambulatory Visit | Attending: Radiation Oncology | Admitting: Radiation Oncology

## 2020-01-08 VITALS — BP 141/77 | HR 86 | Temp 98.2°F | Resp 18 | Ht 71.0 in | Wt 261.4 lb

## 2020-01-08 DIAGNOSIS — Z95828 Presence of other vascular implants and grafts: Secondary | ICD-10-CM

## 2020-01-08 DIAGNOSIS — C3491 Malignant neoplasm of unspecified part of right bronchus or lung: Secondary | ICD-10-CM | POA: Diagnosis not present

## 2020-01-08 DIAGNOSIS — Z510 Encounter for antineoplastic radiation therapy: Secondary | ICD-10-CM | POA: Diagnosis not present

## 2020-01-08 LAB — CBC WITH DIFFERENTIAL (CANCER CENTER ONLY)
Abs Immature Granulocytes: 0.05 10*3/uL (ref 0.00–0.07)
Basophils Absolute: 0 10*3/uL (ref 0.0–0.1)
Basophils Relative: 1 %
Eosinophils Absolute: 0.1 10*3/uL (ref 0.0–0.5)
Eosinophils Relative: 2 %
HCT: 35.4 % — ABNORMAL LOW (ref 39.0–52.0)
Hemoglobin: 11.4 g/dL — ABNORMAL LOW (ref 13.0–17.0)
Immature Granulocytes: 1 %
Lymphocytes Relative: 26 %
Lymphs Abs: 1.3 10*3/uL (ref 0.7–4.0)
MCH: 29 pg (ref 26.0–34.0)
MCHC: 32.2 g/dL (ref 30.0–36.0)
MCV: 90.1 fL (ref 80.0–100.0)
Monocytes Absolute: 0.3 10*3/uL (ref 0.1–1.0)
Monocytes Relative: 6 %
Neutro Abs: 3.3 10*3/uL (ref 1.7–7.7)
Neutrophils Relative %: 64 %
Platelet Count: 243 10*3/uL (ref 150–400)
RBC: 3.93 MIL/uL — ABNORMAL LOW (ref 4.22–5.81)
RDW: 12.7 % (ref 11.5–15.5)
WBC Count: 5.1 10*3/uL (ref 4.0–10.5)
nRBC: 0 % (ref 0.0–0.2)

## 2020-01-08 LAB — CMP (CANCER CENTER ONLY)
ALT: 20 U/L (ref 0–44)
AST: 19 U/L (ref 15–41)
Albumin: 3.1 g/dL — ABNORMAL LOW (ref 3.5–5.0)
Alkaline Phosphatase: 83 U/L (ref 38–126)
Anion gap: 10 (ref 5–15)
BUN: 8 mg/dL (ref 6–20)
CO2: 28 mmol/L (ref 22–32)
Calcium: 8.8 mg/dL — ABNORMAL LOW (ref 8.9–10.3)
Chloride: 101 mmol/L (ref 98–111)
Creatinine: 0.72 mg/dL (ref 0.61–1.24)
GFR, Est AFR Am: >60 mL/min (ref >60)
GFR, Estimated: >60 mL/min (ref >60)
Glucose, Bld: 137 mg/dL — ABNORMAL HIGH (ref 70–99)
Potassium: 4.3 mmol/L (ref 3.5–5.1)
Sodium: 139 mmol/L (ref 135–145)
Total Bilirubin: 0.2 mg/dL — ABNORMAL LOW (ref 0.3–1.2)
Total Protein: 6.7 g/dL (ref 6.5–8.1)

## 2020-01-08 MED ORDER — SODIUM CHLORIDE 0.9% FLUSH
10.0000 mL | INTRAVENOUS | Status: DC | PRN
  Administered 2020-01-08: 10 mL via INTRAVENOUS
  Filled 2020-01-08: qty 10

## 2020-01-08 MED ORDER — HYDROCOD POLST-CPM POLST ER 10-8 MG/5ML PO SUER: 5.0000 mL | Freq: Every evening | ORAL | 0 refills | Status: DC | PRN

## 2020-01-08 MED ORDER — HEPARIN SOD (PORK) LOCK FLUSH 100 UNIT/ML IV SOLN
500.0000 [IU] | Freq: Once | INTRAVENOUS | Status: AC
  Administered 2020-01-08: 09:00:00 500 [IU] via INTRAVENOUS
  Filled 2020-01-08: qty 5

## 2020-01-08 MED ORDER — OXYCODONE HCL 10 MG PO TABS: 10.0000 mg | ORAL_TABLET | ORAL | 0 refills | Status: DC | PRN

## 2020-01-09 ENCOUNTER — Ambulatory Visit
Admission: RE | Admit: 2020-01-09 | Discharge: 2020-01-09 | Disposition: A | Discharge status: Home / Self Care | Payer: 59 | Source: Ambulatory Visit | Attending: Radiation Oncology | Admitting: Radiation Oncology

## 2020-01-09 ENCOUNTER — Encounter: Payer: Self-pay | Admitting: Pulmonary Disease

## 2020-01-09 ENCOUNTER — Telehealth: Payer: Self-pay | Admitting: Physician Assistant

## 2020-01-09 ENCOUNTER — Other Ambulatory Visit: Payer: Self-pay

## 2020-01-09 ENCOUNTER — Ambulatory Visit (INDEPENDENT_AMBULATORY_CARE_PROVIDER_SITE_OTHER): Payer: 59 | Admitting: Pulmonary Disease

## 2020-01-09 VITALS — BP 118/70 | HR 97 | Ht 71.0 in | Wt 263.2 lb

## 2020-01-09 DIAGNOSIS — Z66 Do not resuscitate: Secondary | ICD-10-CM

## 2020-01-09 DIAGNOSIS — C7951 Secondary malignant neoplasm of bone: Secondary | ICD-10-CM | POA: Diagnosis not present

## 2020-01-09 DIAGNOSIS — J9801 Acute bronchospasm: Secondary | ICD-10-CM | POA: Diagnosis not present

## 2020-01-09 DIAGNOSIS — C3491 Malignant neoplasm of unspecified part of right bronchus or lung: Secondary | ICD-10-CM

## 2020-01-09 DIAGNOSIS — J432 Centrilobular emphysema: Secondary | ICD-10-CM

## 2020-01-09 DIAGNOSIS — Z510 Encounter for antineoplastic radiation therapy: Secondary | ICD-10-CM | POA: Diagnosis not present

## 2020-01-09 MED ORDER — ALBUTEROL SULFATE (2.5 MG/3ML) 0.083% IN NEBU: 2.5000 mg | INHALATION_SOLUTION | Freq: Four times a day (QID) | RESPIRATORY_TRACT | 12 refills | Status: DC | PRN

## 2020-01-09 NOTE — Telephone Encounter (Signed)
Added injection appts per los. Called and left msg. Mailed printout

## 2020-01-09 NOTE — Patient Instructions (Addendum)
Thank you for visiting Dr. Valeta Harms at [facility]. Today we recommend the following:  DME supply for nebulizer plus albuterol DNR and [place] MOST Forms today   Return in about 2 months (around 03/10/2020) for w/ Janira Mandell .    Please do your part to reduce the spread of COVID-19.

## 2020-01-09 NOTE — Telephone Encounter (Signed)
Patient called back Advised of message below. Stated he will pick these up today

## 2020-01-09 NOTE — Progress Notes (Signed)
Synopsis: Referred in March 2021 for stage IV lung cancer by [name], MD  Subjective:   PATIENT ID: Mike [name] GENDER: male DOB: 1965-11-23, MRN: [id number]  Chief Complaint  Patient presents with  . Follow-up    Pt states he has been doing okay since last visit. Pt states that it does hurt when he coughs and when he breathes in at times.    This is a 54 year old gentleman longstanding history of smoking, gastroesophageal reflux disease, recent diagnosis of stage IV lung cancer.  Patient was initially seen by me on 12/18/2019.  Patient was taken to the bronchoscopy suite and underwent endobronchial ultrasound with transbronchial needle aspiration biopsies of station 4R as well as endobronchial forcep biopsies of the right upper lobe.Patient had tissue biopsies confirming diagnosis of squamous cell carcinoma.  Ultimately PET scan showing multiple bony lesions as well as MRI negative for metastatic disease.  Patient was started on palliative chemotherapy with medical oncology to include paclitaxel, carboplatin plus Keytruda and Neulasta support to 3 weeks.  Also undergoing palliative radiotherapy to metastatic bone lesions by Dr. Sondra Come.  OV 01/09/2020: Here today for follow-up after bronchoscopy and initiation of therapy for treatment of his lung cancer.  Patient has no significant complaints today except for an episode in which she had very trouble breathing at home.  Patient had chest tightness with wheezing and was rhonchorous.  Had EMT come out to the house.  Once EMT came up to the house he started to feel better and his symptoms dissipated.  He was not really sure what happened.   Past Medical History:  Diagnosis Date  . Arthritis    neck  . Diabetes ([place])   . Fatty liver 12/05/2019   By [place] 11/2019  . GERD (gastroesophageal reflux disease)   . 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 High cholesterol   . IBS (irritable bowel syndrome)   . OSA (obstructive sleep  apnea) 09/15/2019   Sleep study 12/2017 - severe OSA with AHI 65.6, desat to 76% rec CPAP  . Severe obesity (BMI 35.0-39.9) with comorbidity ([place]) 09/04/2019     Family History  Problem Relation Age of Onset  . CAD Mother        stents  . Stroke Neg Hx   . Diabetes Neg Hx   . Cancer Neg Hx   . Colon cancer Neg Hx   . Esophageal cancer Neg Hx   . Stomach cancer Neg Hx   . Rectal cancer Neg Hx      Past Surgical History:  Procedure Laterality Date  . BIOPSY  12/18/2019   Procedure: BIOPSY;  Surgeon: Garner Nash, DO;  Location: [facility];  Service: Pulmonary;;  . BRONCHIAL BRUSHINGS  12/18/2019   Procedure: BRONCHIAL BRUSHINGS;  Surgeon: Garner Nash, DO;  Location: [facility];  Service: Pulmonary;;  . BRONCHIAL WASHINGS  12/18/2019   Procedure: BRONCHIAL WASHINGS;  Surgeon: Garner Nash, DO;  Location: [facility];  Service: Pulmonary;;  . COLONOSCOPY    . CYSTECTOMY     knee- left knee cyst  . ENDOBRONCHIAL ULTRASOUND  12/18/2019   Procedure: ENDOBRONCHIAL ULTRASOUND;  Surgeon: Garner Nash, DO;  Location: [facility];  Service: Pulmonary;;  . FINE NEEDLE ASPIRATION  12/18/2019   Procedure: FINE NEEDLE ASPIRATION;  Surgeon: Garner Nash, DO;  Location: [facility];  Service: Pulmonary;;  . IR IMAGING GUIDED PORT INSERTION  12/27/2019  . PILONIDAL CYST EXCISION    .  VIDEO BRONCHOSCOPY WITH ENDOBRONCHIAL ULTRASOUND Right 12/18/2019   Procedure: VIDEO BRONCHOSCOPY;  Surgeon: Garner Nash, DO;  Location: [facility];  Service: Pulmonary;  Laterality: Right;    Social History   Socioeconomic History  . Marital status: Divorced    Spouse name: Not on file  . Number of children: Not on file  . Years of education: Not on file  . Highest education level: Not on file  Occupational History  . Not on file  Tobacco Use  . Smoking status: Current Every Day Smoker    Packs/day: 3.00    Years: 40.00    Pack years: 120.00  . Smokeless tobacco: Never Used  .  Tobacco comment: currently smoking less than 0.5ppd  Substance and Sexual Activity  . Alcohol use: Yes    Comment: Rarely  . Drug use: Never  . Sexual activity: Yes    Partners: Female  Other Topics Concern  . Not on file  Social History Narrative   Lives with brother Claiborne Billings) and fiancee Mamie Nick)   Occ: Civil engineer, contracting    Edu: HS, self taught [employer]   Activity:    Diet:    Social Determinants of Health   Financial Resource Strain:   . Difficulty of Paying Living Expenses:   Food Insecurity:   . Worried About Charity fundraiser in the Last Year:   . Arboriculturist in the Last Year:   Transportation Needs:   . Film/video editor (Medical):   [name] Lack of Transportation (Non-Medical):   Physical Activity:   . Days of Exercise per Week:   . Minutes of Exercise per Session:   Stress:   . Feeling of Stress :   Social Connections:   . Frequency of Communication with Friends and Family:   . Frequency of Social Gatherings with Friends and Family:   . Attends Religious Services:   . Active Member of Clubs or Organizations:   . Attends Archivist Meetings:   [name] Marital Status:   Intimate Partner Violence:   . Fear of Current or Ex-Partner:   . Emotionally Abused:   [name] Physically Abused:   . Sexually Abused:      No Known Allergies   Outpatient Medications Prior to Visit  Medication Sig Dispense Refill  . aspirin EC 81 MG tablet Take 1 tablet (81 mg total) by mouth daily.    [name] atorvastatin (LIPITOR) 20 MG tablet Take 1 tablet (20 mg total) by mouth daily. (Patient taking differently: Take 10 mg by mouth in the morning and at bedtime. ) 90 tablet 3  . chlorpheniramine-HYDROcodone (TUSSIONEX) 10-8 MG/5ML SUER Take 5 mLs by mouth at bedtime as needed for cough. 115 mL 0  . diazepam (VALIUM) 5 MG tablet TAKE 1 TABLET (5 MG TOTAL) BY MOUTH DAILY AS NEEDED FOR MUSCLE SPASMS. 30 tablet 0  . lidocaine-prilocaine (EMLA) cream Apply to the Port-A-Cath site  30 minutes before treatment 30 g 0  . metFORMIN (GLUCOPHAGE) 1000 MG tablet Take 0.5 tablets (500 mg total) by mouth 2 (two) times daily with a meal. 180 tablet 3  . omeprazole (PRILOSEC) 40 MG capsule Take 1 capsule (40 mg total) by mouth daily. For 3 weeks then as needed (Patient taking differently: Take 40 mg by mouth daily as needed (acid reflux). ) 30 capsule 3  . Oxycodone HCl 10 MG TABS Take 1 tablet (10 mg total) by mouth every 4 (four) hours as needed (severe pain). 45 tablet  0  . sildenafil (REVATIO) 20 MG tablet Take 20-100 mg by mouth daily as needed (ED).     [name] HYDROcodone-acetaminophen (NORCO/VICODIN) 5-325 MG tablet Take 1 tablet by mouth every 6 (six) hours as needed for moderate pain. (Patient not taking: Reported on 01/08/2020) 30 tablet 0  . prochlorperazine (COMPAZINE) 10 MG tablet Take 1 tablet (10 mg total) by mouth every 6 (six) hours as needed for nausea or vomiting. (Patient not taking: Reported on 01/09/2020) 30 tablet 0  . prochlorperazine (COMPAZINE) 25 MG suppository Place 1 suppository (25 mg total) rectally every 12 (twelve) hours as needed for nausea or vomiting. (Patient not taking: Reported on 01/09/2020) 12 suppository 0   No facility-administered medications prior to visit.    Review of Systems  Constitutional: Negative for chills, fever, malaise/fatigue and weight loss.  HENT: Negative for hearing loss, sore throat and tinnitus.   Eyes: Negative for blurred vision and double vision.  Respiratory: Positive for cough and shortness of breath. Negative for hemoptysis, sputum production, wheezing and stridor.   Cardiovascular: Negative for chest pain, palpitations, orthopnea, leg swelling and PND.  Gastrointestinal: Positive for nausea. Negative for abdominal pain, constipation, diarrhea, heartburn and vomiting.  Genitourinary: Negative for dysuria, hematuria and urgency.  Musculoskeletal: Negative for joint pain and myalgias.  Skin: Negative for itching and rash.    Neurological: Negative for dizziness, tingling, weakness and headaches.  Endo/Heme/Allergies: Negative for environmental allergies. Does not bruise/bleed easily.  Psychiatric/Behavioral: Negative for depression. The patient is nervous/anxious. The patient does not have insomnia.   All other systems reviewed and are negative.    Objective:  Physical Exam Vitals reviewed.  Constitutional:      General: He is not in acute distress.    Appearance: He is well-developed. He is obese.  HENT:     Head: Normocephalic and atraumatic.  Eyes:     General: No scleral icterus.    Conjunctiva/sclera: Conjunctivae normal.     Pupils: Pupils are equal, round, and reactive to light.  Neck:     Vascular: No JVD.     Trachea: No tracheal deviation.  Cardiovascular:     Rate and Rhythm: Normal rate and regular rhythm.     Heart sounds: Normal heart sounds. No murmur.  Pulmonary:     Effort: Pulmonary effort is normal. No tachypnea, accessory muscle usage or respiratory distress.     Breath sounds: No stridor. No wheezing, rhonchi or rales.     Comments: Diminished breath sounds on the left upper compared to the right Abdominal:     General: Bowel sounds are normal. There is no distension.     Palpations: Abdomen is soft.     Tenderness: There is no abdominal tenderness.  Musculoskeletal:        General: No tenderness.     Cervical back: Neck supple.  Lymphadenopathy:     Cervical: No cervical adenopathy.  Skin:    General: Skin is warm and dry.     Capillary Refill: Capillary refill takes less than 2 seconds.     Findings: No rash.  Neurological:     Mental Status: He is alert and oriented to person, place, and time.  Psychiatric:        Behavior: Behavior normal.      Vitals:   01/09/20 1200  BP: 118/70  Pulse: 97  SpO2: 97%  Weight: 263 lb 3.2 oz (119.4 kg)  Height: 5\' 11"  (1.803 m)   97% on RA BMI Readings from Last  3 Encounters:  01/09/20 36.71 kg/m  01/08/20 36.46  kg/m  12/26/19 36.48 kg/m   Wt Readings from Last 3 Encounters:  01/09/20 263 lb 3.2 oz (119.4 kg)  01/08/20 261 lb 6.4 oz (118.6 kg)  12/26/19 267 lb 2 oz (121.2 kg)     CBC    Component Value Date/Time   WBC 5.1 01/08/2020 0925   WBC 11.5 (H) 12/27/2019 1320   RBC 3.93 (L) 01/08/2020 0925   HGB 11.4 (L) 01/08/2020 0925   HCT 35.4 (L) 01/08/2020 0925   PLT 243 01/08/2020 0925   MCV 90.1 01/08/2020 0925   MCH 29.0 01/08/2020 0925   MCHC 32.2 01/08/2020 0925   RDW 12.7 01/08/2020 0925   LYMPHSABS 1.3 01/08/2020 0925   MONOABS 0.3 01/08/2020 0925   EOSABS 0.1 01/08/2020 0925   BASOSABS 0.0 01/08/2020 0925      Chest Imaging: 12/12/2019 CT chest with left upper lobe lung mass enlarged paratracheal adenopathy. The patient's images have been independently reviewed by me.    Pulmonary Functions Testing Results: No flowsheet data found.  FeNO: none  Pathology: none  Echocardiogram: none  Heart Catheterization: none    Assessment & Plan:     ICD-10-CM   1. Stage IV squamous cell carcinoma of right lung ([place])  C34.91 Ambulatory Referral for DME  2. Bony metastasis ([place])  C79.51   3. Centrilobular emphysema ([place])  J43.2 Ambulatory Referral for DME  4. Bronchospasm  J98.01   5. DNR (do not resuscitate)  Z66     Discussion: 54 year old gentleman with recent diagnosis of stage IV squamous cell carcinoma of the lung currently undergoing palliative chemotherapy.  He understands this is a nonsurvivable disease and prefers to continue to do everything he can to maintain his current functional status and activities of daily living.  He is tolerating chemotherapy and radiation well.  Assessment:   This chronic illness does pose risk to death and increased risk for another thrombotic complications to include MI and stroke or PE.  Therefore we discussed what to do in these life altering potential implants.  Therefore we had a goals of care discussion and what he would like  to see an event such as this.  We will plan to complete a durable DNR form, [facility] form as well as helping him obtain a handicap sticker from [facility].  Plan Following Extensive Data Review & Interpretation:  . I reviewed prior external note(s) from medical oncology office visit seen yesterday stage IV T2b, N3, M1 C, Cassandra Heillingoetter, PA, Dr. Earlie Server. . I reviewed the result(s) of brain MRI 12/20/2019 no evidence of metastatic disease, pathology report reviewed consistent with squamous cell carcinoma . I have ordered DME supplies for albuterol nebulizer to be used at home as needed.  Independent interpretation of tests . Review of patient's CT chest 12/12/2019 large irregular left upper lobe mass, mediastinal adenopathy, PET scan 12/20/2019 reveals hypermetabolic right upper lobe lesion, mediastinal adenopathy multiple skeletal metastasis concerning for stage IV disease.  The patient's images have been independently reviewed by me.    Discussion of management or test interpretation with another colleague: pathology discussed via phone with Dr. Saralyn Pilar.  Pathology and imaging also discussed at thoracic oncology conference.  18 minutes of this office visit was spent discussing advanced care planning and completion of DNR forms as well as [employer] form.     Current Outpatient Medications:  .  aspirin EC 81 MG tablet, Take 1 tablet (81 mg total)  by mouth daily., Disp:  , Rfl:  .  atorvastatin (LIPITOR) 20 MG tablet, Take 1 tablet (20 mg total) by mouth daily. (Patient taking differently: Take 10 mg by mouth in the morning and at bedtime. ), Disp: 90 tablet, Rfl: 3 .  chlorpheniramine-HYDROcodone (TUSSIONEX) 10-8 MG/5ML SUER, Take 5 mLs by mouth at bedtime as needed for cough., Disp: 115 mL, Rfl: 0 .  diazepam (VALIUM) 5 MG tablet, TAKE 1 TABLET (5 MG TOTAL) BY MOUTH DAILY AS NEEDED FOR MUSCLE SPASMS., Disp: 30 tablet, Rfl: 0 .  lidocaine-prilocaine (EMLA) cream, Apply to the  Port-A-Cath site 30 minutes before treatment, Disp: 30 g, Rfl: 0 .  metFORMIN (GLUCOPHAGE) 1000 MG tablet, Take 0.5 tablets (500 mg total) by mouth 2 (two) times daily with a meal., Disp: 180 tablet, Rfl: 3 .  omeprazole (PRILOSEC) 40 MG capsule, Take 1 capsule (40 mg total) by mouth daily. For 3 weeks then as needed (Patient taking differently: Take 40 mg by mouth daily as needed (acid reflux). ), Disp: 30 capsule, Rfl: 3 .  Oxycodone HCl 10 MG TABS, Take 1 tablet (10 mg total) by mouth every 4 (four) hours as needed (severe pain)., Disp: 45 tablet, Rfl: 0 .  sildenafil (REVATIO) 20 MG tablet, Take 20-100 mg by mouth daily as needed (ED). , Disp: , Rfl:  .  albuterol (PROVENTIL) (2.5 MG/3ML) 0.083% nebulizer solution, Take 3 mLs (2.5 mg total) by nebulization every 6 (six) hours as needed for wheezing or shortness of breath., Disp: 75 mL, Rfl: 12 .  HYDROcodone-acetaminophen (NORCO/VICODIN) 5-325 MG tablet, Take 1 tablet by mouth every 6 (six) hours as needed for moderate pain. (Patient not taking: Reported on 01/08/2020), Disp: 30 tablet, Rfl: 0 .  prochlorperazine (COMPAZINE) 10 MG tablet, Take 1 tablet (10 mg total) by mouth every 6 (six) hours as needed for nausea or vomiting. (Patient not taking: Reported on 01/09/2020), Disp: 30 tablet, Rfl: 0 .  prochlorperazine (COMPAZINE) 25 MG suppository, Place 1 suppository (25 mg total) rectally every 12 (twelve) hours as needed for nausea or vomiting. (Patient not taking: Reported on 01/09/2020), Disp: 12 suppository, Rfl: 0   Garner Nash, DO [facility] 01/09/2020 12:45 PM

## 2020-01-09 NOTE — Telephone Encounter (Signed)
Forms filled and in my out box.

## 2020-01-09 NOTE — Telephone Encounter (Signed)
Left message asking pt to call office Please let pt know his paperwork is ready for pick up and turn in.  Copy for pt Copy for scan

## 2020-01-10 ENCOUNTER — Ambulatory Visit
Admission: RE | Admit: 2020-01-10 | Discharge: 2020-01-10 | Disposition: A | Discharge status: Home / Self Care | Payer: 59 | Source: Ambulatory Visit | Attending: Radiation Oncology | Admitting: Radiation Oncology

## 2020-01-10 ENCOUNTER — Other Ambulatory Visit: Payer: Self-pay

## 2020-01-10 DIAGNOSIS — Z510 Encounter for antineoplastic radiation therapy: Secondary | ICD-10-CM | POA: Diagnosis not present

## 2020-01-11 ENCOUNTER — Ambulatory Visit
Admission: RE | Admit: 2020-01-11 | Discharge: 2020-01-11 | Disposition: A | Discharge status: Home / Self Care | Payer: 59 | Source: Ambulatory Visit | Attending: Radiation Oncology | Admitting: Radiation Oncology

## 2020-01-11 ENCOUNTER — Other Ambulatory Visit: Payer: Self-pay

## 2020-01-11 DIAGNOSIS — Z510 Encounter for antineoplastic radiation therapy: Secondary | ICD-10-CM | POA: Diagnosis not present

## 2020-01-14 ENCOUNTER — Encounter: Payer: Self-pay | Admitting: Radiation Oncology

## 2020-01-14 ENCOUNTER — Other Ambulatory Visit: Payer: Self-pay | Admitting: Radiation Oncology

## 2020-01-14 ENCOUNTER — Ambulatory Visit
Admission: RE | Admit: 2020-01-14 | Discharge: 2020-01-14 | Disposition: A | Discharge status: Home / Self Care | Payer: 59 | Source: Ambulatory Visit | Attending: Radiation Oncology | Admitting: Radiation Oncology

## 2020-01-14 ENCOUNTER — Other Ambulatory Visit: Payer: Self-pay

## 2020-01-14 DIAGNOSIS — Z510 Encounter for antineoplastic radiation therapy: Secondary | ICD-10-CM | POA: Diagnosis not present

## 2020-01-14 MED ORDER — OXYCODONE HCL 10 MG PO TABS: 10.0000 mg | ORAL_TABLET | ORAL | 0 refills | Status: DC | PRN

## 2020-01-15 ENCOUNTER — Inpatient Hospital Stay: Payer: 59

## 2020-01-15 NOTE — Progress Notes (Signed)
Pharmacist Chemotherapy Monitoring - Follow Up Assessment    I verify that I have reviewed each item in the below checklist:  . Regimen for the patient is scheduled for the appropriate day and plan matches scheduled date. [name] Appropriate non-routine labs are ordered dependent on drug ordered. . If applicable, additional medications reviewed and ordered per protocol based on lifetime cumulative doses and/or treatment regimen.   Plan for follow-up and/or issues identified: No . I-vent associated with next due treatment: No . MD and/or nursing notified: No  Ayliana Casciano K 01/15/2020 9:06 AM

## 2020-01-16 ENCOUNTER — Inpatient Hospital Stay: Payer: 59

## 2020-01-16 ENCOUNTER — Other Ambulatory Visit: Payer: Self-pay

## 2020-01-16 DIAGNOSIS — C3491 Malignant neoplasm of unspecified part of right bronchus or lung: Secondary | ICD-10-CM | POA: Diagnosis not present

## 2020-01-16 LAB — CBC WITH DIFFERENTIAL (CANCER CENTER ONLY)
Abs Immature Granulocytes: 0.03 10*3/uL (ref 0.00–0.07)
Basophils Absolute: 0 10*3/uL (ref 0.0–0.1)
Basophils Relative: 1 %
Eosinophils Absolute: 0 10*3/uL (ref 0.0–0.5)
Eosinophils Relative: 1 %
HCT: 39.6 % (ref 39.0–52.0)
Hemoglobin: 12.5 g/dL — ABNORMAL LOW (ref 13.0–17.0)
Immature Granulocytes: 1 %
Lymphocytes Relative: 33 %
Lymphs Abs: 1.2 10*3/uL (ref 0.7–4.0)
MCH: 29.3 pg (ref 26.0–34.0)
MCHC: 31.6 g/dL (ref 30.0–36.0)
MCV: 92.7 fL (ref 80.0–100.0)
Monocytes Absolute: 0.8 10*3/uL (ref 0.1–1.0)
Monocytes Relative: 22 %
Neutro Abs: 1.5 10*3/uL — ABNORMAL LOW (ref 1.7–7.7)
Neutrophils Relative %: 42 %
Platelet Count: 198 10*3/uL (ref 150–400)
RBC: 4.27 MIL/uL (ref 4.22–5.81)
RDW: 13.6 % (ref 11.5–15.5)
WBC Count: 3.5 10*3/uL — ABNORMAL LOW (ref 4.0–10.5)
nRBC: 0 % (ref 0.0–0.2)

## 2020-01-16 LAB — CMP (CANCER CENTER ONLY)
ALT: 22 U/L (ref 0–44)
AST: 17 U/L (ref 15–41)
Albumin: 3.2 g/dL — ABNORMAL LOW (ref 3.5–5.0)
Alkaline Phosphatase: 94 U/L (ref 38–126)
Anion gap: 9 (ref 5–15)
BUN: 6 mg/dL (ref 6–20)
CO2: 27 mmol/L (ref 22–32)
Calcium: 9.1 mg/dL (ref 8.9–10.3)
Chloride: 100 mmol/L (ref 98–111)
Creatinine: 0.77 mg/dL (ref 0.61–1.24)
GFR, Est AFR Am: >60 mL/min (ref >60)
GFR, Estimated: >60 mL/min (ref >60)
Glucose, Bld: 207 mg/dL — ABNORMAL HIGH (ref 70–99)
Potassium: 4.6 mmol/L (ref 3.5–5.1)
Sodium: 136 mmol/L (ref 135–145)
Total Bilirubin: 0.2 mg/dL — ABNORMAL LOW (ref 0.3–1.2)
Total Protein: 7.1 g/dL (ref 6.5–8.1)

## 2020-01-20 ENCOUNTER — Emergency Department (HOSPITAL_COMMUNITY): Payer: 59

## 2020-01-20 ENCOUNTER — Encounter (HOSPITAL_COMMUNITY): Payer: Self-pay

## 2020-01-20 ENCOUNTER — Inpatient Hospital Stay (HOSPITAL_COMMUNITY): Payer: 59

## 2020-01-20 ENCOUNTER — Other Ambulatory Visit: Payer: Self-pay

## 2020-01-20 ENCOUNTER — Inpatient Hospital Stay (HOSPITAL_COMMUNITY)
Admission: EM | Admit: 2020-01-20 | Discharge: 2020-01-23 | DRG: 175 | Disposition: A | Discharge status: Home / Self Care | Payer: 59 | Attending: Internal Medicine | Admitting: Internal Medicine

## 2020-01-20 DIAGNOSIS — G4733 Obstructive sleep apnea (adult) (pediatric): Secondary | ICD-10-CM | POA: Diagnosis present

## 2020-01-20 DIAGNOSIS — R0600 Dyspnea, unspecified: Secondary | ICD-10-CM

## 2020-01-20 DIAGNOSIS — I2692 Saddle embolus of pulmonary artery without acute cor pulmonale: Secondary | ICD-10-CM

## 2020-01-20 DIAGNOSIS — Z8249 Family history of ischemic heart disease and other diseases of the circulatory system: Secondary | ICD-10-CM

## 2020-01-20 DIAGNOSIS — Z7984 Long term (current) use of oral hypoglycemic drugs: Secondary | ICD-10-CM

## 2020-01-20 DIAGNOSIS — D6832 Hemorrhagic disorder due to extrinsic circulating anticoagulants: Secondary | ICD-10-CM | POA: Diagnosis not present

## 2020-01-20 DIAGNOSIS — E7800 Pure hypercholesterolemia, unspecified: Secondary | ICD-10-CM | POA: Diagnosis present

## 2020-01-20 DIAGNOSIS — T39015A Adverse effect of aspirin, initial encounter: Secondary | ICD-10-CM | POA: Diagnosis not present

## 2020-01-20 DIAGNOSIS — Y92239 Unspecified place in hospital as the place of occurrence of the external cause: Secondary | ICD-10-CM | POA: Diagnosis not present

## 2020-01-20 DIAGNOSIS — C3491 Malignant neoplasm of unspecified part of right bronchus or lung: Secondary | ICD-10-CM | POA: Diagnosis not present

## 2020-01-20 DIAGNOSIS — T45615A Adverse effect of thrombolytic drugs, initial encounter: Secondary | ICD-10-CM | POA: Diagnosis not present

## 2020-01-20 DIAGNOSIS — C3492 Malignant neoplasm of unspecified part of left bronchus or lung: Secondary | ICD-10-CM | POA: Diagnosis not present

## 2020-01-20 DIAGNOSIS — J9601 Acute respiratory failure with hypoxia: Secondary | ICD-10-CM | POA: Diagnosis present

## 2020-01-20 DIAGNOSIS — K219 Gastro-esophageal reflux disease without esophagitis: Secondary | ICD-10-CM | POA: Diagnosis not present

## 2020-01-20 DIAGNOSIS — I2602 Saddle embolus of pulmonary artery with acute cor pulmonale: Principal | ICD-10-CM | POA: Diagnosis present

## 2020-01-20 DIAGNOSIS — F17210 Nicotine dependence, cigarettes, uncomplicated: Secondary | ICD-10-CM | POA: Diagnosis present

## 2020-01-20 DIAGNOSIS — C7951 Secondary malignant neoplasm of bone: Secondary | ICD-10-CM | POA: Diagnosis not present

## 2020-01-20 DIAGNOSIS — R112 Nausea with vomiting, unspecified: Secondary | ICD-10-CM | POA: Diagnosis present

## 2020-01-20 DIAGNOSIS — R319 Hematuria, unspecified: Secondary | ICD-10-CM | POA: Diagnosis not present

## 2020-01-20 DIAGNOSIS — Z66 Do not resuscitate: Secondary | ICD-10-CM | POA: Diagnosis present

## 2020-01-20 DIAGNOSIS — I2609 Other pulmonary embolism with acute cor pulmonale: Secondary | ICD-10-CM | POA: Diagnosis not present

## 2020-01-20 DIAGNOSIS — Z923 Personal history of irradiation: Secondary | ICD-10-CM

## 2020-01-20 DIAGNOSIS — K760 Fatty (change of) liver, not elsewhere classified: Secondary | ICD-10-CM | POA: Diagnosis present

## 2020-01-20 DIAGNOSIS — Z79891 Long term (current) use of opiate analgesic: Secondary | ICD-10-CM

## 2020-01-20 DIAGNOSIS — E785 Hyperlipidemia, unspecified: Secondary | ICD-10-CM | POA: Diagnosis present

## 2020-01-20 DIAGNOSIS — Z79899 Other long term (current) drug therapy: Secondary | ICD-10-CM

## 2020-01-20 DIAGNOSIS — E1169 Type 2 diabetes mellitus with other specified complication: Secondary | ICD-10-CM | POA: Diagnosis present

## 2020-01-20 DIAGNOSIS — Z20822 Contact with and (suspected) exposure to covid-19: Secondary | ICD-10-CM | POA: Diagnosis present

## 2020-01-20 DIAGNOSIS — E669 Obesity, unspecified: Secondary | ICD-10-CM | POA: Diagnosis present

## 2020-01-20 DIAGNOSIS — G893 Neoplasm related pain (acute) (chronic): Secondary | ICD-10-CM | POA: Diagnosis present

## 2020-01-20 DIAGNOSIS — Z7982 Long term (current) use of aspirin: Secondary | ICD-10-CM

## 2020-01-20 DIAGNOSIS — T45515A Adverse effect of anticoagulants, initial encounter: Secondary | ICD-10-CM | POA: Diagnosis not present

## 2020-01-20 DIAGNOSIS — E1165 Type 2 diabetes mellitus with hyperglycemia: Secondary | ICD-10-CM | POA: Diagnosis present

## 2020-01-20 DIAGNOSIS — I2699 Other pulmonary embolism without acute cor pulmonale: Secondary | ICD-10-CM | POA: Diagnosis present

## 2020-01-20 DIAGNOSIS — R300 Dysuria: Secondary | ICD-10-CM | POA: Diagnosis present

## 2020-01-20 DIAGNOSIS — Z9221 Personal history of antineoplastic chemotherapy: Secondary | ICD-10-CM

## 2020-01-20 DIAGNOSIS — Z6836 Body mass index (BMI) 36.0-36.9, adult: Secondary | ICD-10-CM | POA: Diagnosis not present

## 2020-01-20 DIAGNOSIS — M1990 Unspecified osteoarthritis, unspecified site: Secondary | ICD-10-CM | POA: Diagnosis present

## 2020-01-20 DIAGNOSIS — I2782 Chronic pulmonary embolism: Secondary | ICD-10-CM | POA: Diagnosis present

## 2020-01-20 HISTORY — DX: Malignant (primary) neoplasm, unspecified: C80.1

## 2020-01-20 LAB — CBC
HCT: 40 % (ref 39.0–52.0)
HCT: 38.5 % — ABNORMAL LOW (ref 39.0–52.0)
Hemoglobin: 12.8 g/dL — ABNORMAL LOW (ref 13.0–17.0)
Hemoglobin: 12.7 g/dL — ABNORMAL LOW (ref 13.0–17.0)
MCH: 29.6 pg (ref 26.0–34.0)
MCH: 30.5 pg (ref 26.0–34.0)
MCHC: 32 g/dL (ref 30.0–36.0)
MCHC: 33 g/dL (ref 30.0–36.0)
MCV: 92.4 fL (ref 80.0–100.0)
MCV: 92.3 fL (ref 80.0–100.0)
Platelets: 130 10*3/uL — ABNORMAL LOW (ref 150–400)
Platelets: 140 K/uL — ABNORMAL LOW (ref 150–400)
RBC: 4.33 MIL/uL (ref 4.22–5.81)
RBC: 4.17 MIL/uL — ABNORMAL LOW (ref 4.22–5.81)
RDW: 14.2 % (ref 11.5–15.5)
RDW: 14.5 % (ref 11.5–15.5)
WBC: 9.1 10*3/uL (ref 4.0–10.5)
WBC: 8.1 K/uL (ref 4.0–10.5)
nRBC: 0 % (ref 0.0–0.2)
nRBC: 0 % (ref 0.0–0.2)

## 2020-01-20 LAB — CBC WITH DIFFERENTIAL/PLATELET
Abs Immature Granulocytes: 0.1 10*3/uL — ABNORMAL HIGH (ref 0.00–0.07)
Basophils Absolute: 0.1 10*3/uL (ref 0.0–0.1)
Basophils Relative: 1 %
Eosinophils Absolute: 0 10*3/uL (ref 0.0–0.5)
Eosinophils Relative: 0 %
HCT: 40.3 % (ref 39.0–52.0)
Hemoglobin: 12.9 g/dL — ABNORMAL LOW (ref 13.0–17.0)
Immature Granulocytes: 1 %
Lymphocytes Relative: 14 %
Lymphs Abs: 1.1 10*3/uL (ref 0.7–4.0)
MCH: 30 pg (ref 26.0–34.0)
MCHC: 32 g/dL (ref 30.0–36.0)
MCV: 93.7 fL (ref 80.0–100.0)
Monocytes Absolute: 1.1 10*3/uL — ABNORMAL HIGH (ref 0.1–1.0)
Monocytes Relative: 14 %
Neutro Abs: 5.3 10*3/uL (ref 1.7–7.7)
Neutrophils Relative %: 70 %
Platelets: 129 10*3/uL — ABNORMAL LOW (ref 150–400)
RBC: 4.3 MIL/uL (ref 4.22–5.81)
RDW: 14.2 % (ref 11.5–15.5)
WBC: 7.6 10*3/uL (ref 4.0–10.5)
nRBC: 0 % (ref 0.0–0.2)

## 2020-01-20 LAB — BASIC METABOLIC PANEL
Anion gap: 9 (ref 5–15)
BUN: 13 mg/dL (ref 6–20)
CO2: 25 mmol/L (ref 22–32)
Calcium: 8.7 mg/dL — ABNORMAL LOW (ref 8.9–10.3)
Chloride: 102 mmol/L (ref 98–111)
Creatinine, Ser: 0.7 mg/dL (ref 0.61–1.24)
GFR calc Af Amer: >60 mL/min (ref >60)
GFR calc non Af Amer: >60 mL/min (ref >60)
Glucose, Bld: 267 mg/dL — ABNORMAL HIGH (ref 70–99)
Potassium: 4.8 mmol/L (ref 3.5–5.1)
Sodium: 136 mmol/L (ref 135–145)

## 2020-01-20 LAB — RESPIRATORY PANEL BY RT PCR (FLU A&B, COVID)
Influenza A by PCR: NEGATIVE
Influenza B by PCR: NEGATIVE
SARS Coronavirus 2 by RT PCR: NEGATIVE

## 2020-01-20 LAB — ECHOCARDIOGRAM COMPLETE
Height: 71 in
Weight: 4222.25 oz

## 2020-01-20 LAB — PROTIME-INR
INR: 1.5 — ABNORMAL HIGH (ref 0.8–1.2)
INR: 1.3 — ABNORMAL HIGH (ref 0.8–1.2)
Prothrombin Time: 18.3 seconds — ABNORMAL HIGH (ref 11.4–15.2)
Prothrombin Time: 16.5 s — ABNORMAL HIGH (ref 11.4–15.2)

## 2020-01-20 LAB — HIV ANTIBODY (ROUTINE TESTING W REFLEX): HIV Screen 4th Generation wRfx: NONREACTIVE

## 2020-01-20 LAB — GLUCOSE, CAPILLARY
Glucose-Capillary: 197 mg/dL — ABNORMAL HIGH (ref 70–99)
Glucose-Capillary: 155 mg/dL — ABNORMAL HIGH (ref 70–99)
Glucose-Capillary: 160 mg/dL — ABNORMAL HIGH (ref 70–99)

## 2020-01-20 LAB — HEPARIN LEVEL (UNFRACTIONATED): Heparin Unfractionated: 0.11 [IU]/mL — ABNORMAL LOW (ref 0.30–0.70)

## 2020-01-20 LAB — TROPONIN I (HIGH SENSITIVITY): Troponin I (High Sensitivity): 358 ng/L (ref <18)

## 2020-01-20 LAB — MRSA PCR SCREENING: MRSA by PCR: NEGATIVE

## 2020-01-20 LAB — APTT: aPTT: 62 seconds — ABNORMAL HIGH (ref 24–36)

## 2020-01-20 MED ORDER — SODIUM CHLORIDE 0.9% FLUSH: 10.0000 mL | Freq: Two times a day (BID) | INTRAVENOUS | Status: DC

## 2020-01-20 MED ORDER — INSULIN ASPART 100 UNIT/ML ~~LOC~~ SOLN: 0.0000 [IU] | Freq: Every day | SUBCUTANEOUS | Status: DC

## 2020-01-20 MED ORDER — SODIUM CHLORIDE (PF) 0.9 % IJ SOLN
INTRAMUSCULAR | Status: AC
  Filled 2020-01-20: qty 50

## 2020-01-20 MED ORDER — CHLORHEXIDINE GLUCONATE CLOTH 2 % EX PADS
6.0000 | MEDICATED_PAD | Freq: Every day | CUTANEOUS | Status: DC
  Administered 2020-01-20 – 2020-01-23 (×4): 6 via TOPICAL

## 2020-01-20 MED ORDER — ATORVASTATIN CALCIUM 20 MG PO TABS
20.0000 mg | ORAL_TABLET | Freq: Every day | ORAL | Status: DC
  Administered 2020-01-20 – 2020-01-23 (×4): 20 mg via ORAL
  Filled 2020-01-20: qty 1
  Filled 2020-01-20 (×3): qty 2

## 2020-01-20 MED ORDER — HEPARIN (PORCINE) 25000 UT/250ML-% IV SOLN
1600.0000 [IU]/h | INTRAVENOUS | Status: DC
  Administered 2020-01-21: 1600 [IU]/h via INTRAVENOUS
  Filled 2020-01-20: qty 250

## 2020-01-20 MED ORDER — INSULIN ASPART 100 UNIT/ML ~~LOC~~ SOLN
0.0000 [IU] | Freq: Three times a day (TID) | SUBCUTANEOUS | Status: DC
  Administered 2020-01-21: 2 [IU] via SUBCUTANEOUS
  Administered 2020-01-21 (×2): 1 [IU] via SUBCUTANEOUS
  Administered 2020-01-22: 2 [IU] via SUBCUTANEOUS
  Administered 2020-01-23: 1 [IU] via SUBCUTANEOUS

## 2020-01-20 MED ORDER — HEPARIN (PORCINE) 25000 UT/250ML-% IV SOLN
1400.0000 [IU]/h | INTRAVENOUS | Status: DC
  Administered 2020-01-20: 1400 [IU]/h via INTRAVENOUS
  Filled 2020-01-20 (×2): qty 250

## 2020-01-20 MED ORDER — ONDANSETRON HCL 4 MG/2ML IJ SOLN: 4.0000 mg | Freq: Four times a day (QID) | INTRAMUSCULAR | Status: DC | PRN

## 2020-01-20 MED ORDER — LORAZEPAM 2 MG/ML IJ SOLN
1.0000 mg | Freq: Once | INTRAMUSCULAR | Status: AC
  Administered 2020-01-20: 07:00:00 1 mg via INTRAVENOUS
  Filled 2020-01-20: qty 1

## 2020-01-20 MED ORDER — OXYCODONE HCL 5 MG PO TABS
10.0000 mg | ORAL_TABLET | ORAL | Status: DC | PRN
  Administered 2020-01-20 – 2020-01-22 (×3): 10 mg via ORAL
  Filled 2020-01-20 (×3): qty 2

## 2020-01-20 MED ORDER — ACETAMINOPHEN 325 MG PO TABS: 650.0000 mg | ORAL_TABLET | ORAL | Status: DC | PRN

## 2020-01-20 MED ORDER — ALTEPLASE (PULMONARY EMBOLISM) INFUSION
100.0000 mg | Freq: Once | INTRAVENOUS | Status: AC
  Administered 2020-01-20: 100 mg via INTRAVENOUS
  Filled 2020-01-20: qty 100

## 2020-01-20 MED ORDER — METFORMIN HCL 500 MG PO TABS
500.0000 mg | ORAL_TABLET | Freq: Two times a day (BID) | ORAL | Status: DC
  Administered 2020-01-20 – 2020-01-23 (×6): 500 mg via ORAL
  Filled 2020-01-20 (×6): qty 1

## 2020-01-20 MED ORDER — IOHEXOL 350 MG/ML SOLN
80.0000 mL | Freq: Once | INTRAVENOUS | Status: AC | PRN
  Administered 2020-01-20: 80 mL via INTRAVENOUS

## 2020-01-20 MED ORDER — SODIUM CHLORIDE 0.9% FLUSH: 10.0000 mL | INTRAVENOUS | Status: DC | PRN

## 2020-01-20 MED ORDER — SODIUM CHLORIDE 0.9% FLUSH
10.0000 mL | Freq: Two times a day (BID) | INTRAVENOUS | Status: DC
  Administered 2020-01-20 – 2020-01-22 (×5): 10 mL

## 2020-01-20 MED ORDER — SODIUM CHLORIDE 0.9% FLUSH
10.0000 mL | INTRAVENOUS | Status: DC | PRN
  Administered 2020-01-23: 10 mL

## 2020-01-20 MED ORDER — ALBUTEROL SULFATE (2.5 MG/3ML) 0.083% IN NEBU: 2.5000 mg | INHALATION_SOLUTION | Freq: Four times a day (QID) | RESPIRATORY_TRACT | Status: DC | PRN

## 2020-01-20 MED ORDER — SODIUM CHLORIDE 0.9 % IV SOLN
250.0000 mL | Freq: Once | INTRAVENOUS | Status: AC
  Administered 2020-01-20: 250 mL via INTRAVENOUS

## 2020-01-20 MED ORDER — DIAZEPAM 5 MG PO TABS
5.0000 mg | ORAL_TABLET | Freq: Every day | ORAL | Status: DC | PRN
  Administered 2020-01-21: 16:00:00 5 mg via ORAL
  Filled 2020-01-20: qty 1

## 2020-01-20 MED ORDER — HEPARIN (PORCINE) 25000 UT/250ML-% IV SOLN: 1800.0000 [IU]/h | INTRAVENOUS | Status: DC

## 2020-01-20 MED ORDER — HYDROCOD POLST-CPM POLST ER 10-8 MG/5ML PO SUER
5.0000 mL | Freq: Every evening | ORAL | Status: DC | PRN
  Administered 2020-01-20 – 2020-01-22 (×3): 5 mL via ORAL
  Filled 2020-01-20 (×3): qty 5

## 2020-01-20 MED ORDER — ASPIRIN EC 81 MG PO TBEC
81.0000 mg | DELAYED_RELEASE_TABLET | Freq: Every day | ORAL | Status: DC
  Administered 2020-01-20 – 2020-01-22 (×3): 81 mg via ORAL
  Filled 2020-01-20 (×3): qty 1

## 2020-01-20 MED ORDER — SILDENAFIL CITRATE 20 MG PO TABS: 20.0000 mg | ORAL_TABLET | Freq: Every day | ORAL | Status: DC | PRN

## 2020-01-20 MED ORDER — HEPARIN BOLUS VIA INFUSION
3000.0000 [IU] | Freq: Once | INTRAVENOUS | Status: DC
  Filled 2020-01-20: qty 3000

## 2020-01-20 MED ORDER — PANTOPRAZOLE SODIUM 40 MG PO TBEC
40.0000 mg | DELAYED_RELEASE_TABLET | Freq: Every day | ORAL | Status: DC
  Administered 2020-01-20 – 2020-01-23 (×4): 40 mg via ORAL
  Filled 2020-01-20 (×4): qty 1

## 2020-01-20 NOTE — H&P (Signed)
NAME:  Mike [name], MRN:  [id number], DOB:  Feb 20, 1966, LOS: 0 ADMISSION DATE:  01/20/2020, CONSULTATION DATE:  4/4 REFERRING MD:  Maryan Rued, CHIEF COMPLAINT:  dyspnea   Brief History   54 y/o male with a history of lung cancer admitted on 4/4 in the setting of acute onset dyspnea from a large PE.  History of present illness   54 y/o male with a history of lung cancer diagnosed recently and now on chemotherapy and radiation therapy for the same presented last night with dyspnea and chest discomfort.  The symptoms started suddenly last night.  He is now on 2 L O2 with tachycardia and ongoing dyspnea.  He also notes pain in his hip and rib which is consistent with his known malignancy. He denies any recent bleeding episodes, any stroke ever, and his only surgery was the port placement recently.  He takes 81mg  aspirin.    Past Medical History  Morbid obesity OSA Hyperlipidemia GERD Fatty liver DM2  Significant Hospital Events   4/4 admission >   Consults:    Procedures:    Significant Diagnostic Tests:  3/4 MRI brain > no metastatic lesions in brain 4/4 CT angiogram chest >acute central pulmonary embolism with clot starting at R pulmonary artery outflow tract with RH strain, concern for RV clot, known bronchogenic carcinoma and nodal/bony metastases  Micro Data:    Antimicrobials:     Interim history/subjective:  As above  Objective   Blood pressure 119/87, pulse (!) 135, temperature 97.7 F (36.5 C), temperature source Oral, resp. rate (!) 26, height 5\' 11"  (1.803 m), weight 122 kg, SpO2 95 %.       No intake or output data in the 24 hours ending 01/20/20 0802 Filed Weights   01/20/20 0533  Weight: 122 kg    Examination:  General:  Resting comfortably in bed HENT: NCAT OP clear PULM: Scattered wheezing in bases, normal effort CV: tachycardia, no mgr GI: BS+, soft, nontender MSK: normal bulk and tone Neuro: awake, alert, no distress, MAEW   Resolved  Hospital Problem list     Assessment & Plan:  Acute pulmonary embolism with right heart strain, sub massive, clot extending into RV: No contraindications to thrombolytic therapy Given presence of clot in RV, significant RV strain, tachycardia, positive troponin he is at high risk of death from this blood clot.  Thrombolysis therapy in this situation can help reduce RV strain sooner.  He does not have any absolute contraindications to TPA.   > admit to ICU > tele > discussed risks and benefits of thrombolytic therapy with the patient, he consents to TPA > TPA protocol per pharmacy, followed by heparin > echo > LE doppler  Lung cancer, metastatic with pain, cough > notify oncology team > prn tussionex > prn vicodin > valium for muscle spasm  DM2 > metformin  Nausea/vomiting > compazine  GERD: Omeprazole  Best practice:  Diet: regular diet Pain/Anxiety/Delirium protocol (if indicated): n/a VAP protocol (if indicated): yes DVT prophylaxis: heparin GI prophylaxis: n/a Glucose control: DM2, SSI Mobility: bed rest Code Status: full Family Communication: wife updated bedside Disposition:admit to ICU  Labs   CBC: Recent Labs  Lab 01/16/20 1152 01/20/20 0631  WBC 3.5* 7.6  NEUTROABS 1.5* 5.3  HGB 12.5* 12.9*  HCT 39.6 40.3  MCV 92.7 93.7  PLT 198 129*    Basic Metabolic Panel: Recent Labs  Lab 01/16/20 1152 01/20/20 0631  NA 136 136  K 4.6 4.8  CL 100  102  CO2 27 25  GLUCOSE 207* 267*  BUN 6 13  CREATININE 0.77 0.70  CALCIUM 9.1 8.7*   GFR: Estimated Creatinine Clearance: 137.1 mL/min (by C-G formula based on SCr of 0.7 mg/dL). Recent Labs  Lab 01/16/20 1152 01/20/20 0631  WBC 3.5* 7.6    Liver Function Tests: Recent Labs  Lab 01/16/20 1152  AST 17  ALT 22  ALKPHOS 94  BILITOT 0.2*  PROT 7.1  ALBUMIN 3.2*   No results for input(s): LIPASE, AMYLASE in the last 168 hours. No results for input(s): AMMONIA in the last 168 hours.  ABG No  results found for: PHART, PCO2ART, PO2ART, HCO3, TCO2, ACIDBASEDEF, O2SAT   Coagulation Profile: No results for input(s): INR, PROTIME in the last 168 hours.  Cardiac Enzymes: No results for input(s): CKTOTAL, CKMB, CKMBINDEX, TROPONINI in the last 168 hours.  HbA1C: Hemoglobin A1C  Date/Time Value Ref Range Status  12/05/2019 03:28 PM 7.2 (A) 4.0 - 5.6 % Final   Hgb A1c MFr Bld  Date/Time Value Ref Range Status  09/04/2019 09:55 AM 8.4 (H) 4.6 - 6.5 % Final    Comment:    Glycemic Control Guidelines for People with Diabetes:Non Diabetic:  <6%Goal of Therapy: <7%Additional Action Suggested:  >8%     CBG: No results for input(s): GLUCAP in the last 168 hours.  Review of Systems:   Gen: Denies fever, chills, weight change, fatigue, night sweats HEENT: Denies blurred vision, double vision, hearing loss, tinnitus, sinus congestion, rhinorrhea, sore throat, neck stiffness, dysphagia PULM: per HPI CV: Denies chest pain, edema, orthopnea, paroxysmal nocturnal dyspnea, palpitations GI: Denies abdominal pain, nausea, vomiting, diarrhea, hematochezia, melena, constipation, change in bowel habits GU: Denies dysuria, hematuria, polyuria, oliguria, urethral discharge Endocrine: Denies hot or cold intolerance, polyuria, polyphagia or appetite change Derm: Denies rash, dry skin, scaling or peeling skin change Heme: Denies easy bruising, bleeding, bleeding gums Neuro: Denies headache, numbness, weakness, slurred speech, loss of memory or consciousness   Past Medical History  He,  has a past medical history of Arthritis, Cancer ([place]), Diabetes ([place]), Fatty liver (12/05/2019), GERD (gastroesophageal reflux disease), Heart murmur, High cholesterol, IBS (irritable bowel syndrome), OSA (obstructive sleep apnea) (09/15/2019), and Severe obesity (BMI 35.0-39.9) with comorbidity ([place]) (09/04/2019).   Surgical History    Past Surgical History:  Procedure Laterality Date  . BIOPSY  12/18/2019    Procedure: BIOPSY;  Surgeon: Garner Nash, DO;  Location: [facility];  Service: Pulmonary;;  . BRONCHIAL BRUSHINGS  12/18/2019   Procedure: BRONCHIAL BRUSHINGS;  Surgeon: Garner Nash, DO;  Location: [facility];  Service: Pulmonary;;  . BRONCHIAL WASHINGS  12/18/2019   Procedure: BRONCHIAL WASHINGS;  Surgeon: Garner Nash, DO;  Location: [facility];  Service: Pulmonary;;  . COLONOSCOPY    . CYSTECTOMY     knee- left knee cyst  . ENDOBRONCHIAL ULTRASOUND  12/18/2019   Procedure: ENDOBRONCHIAL ULTRASOUND;  Surgeon: Garner Nash, DO;  Location: [facility];  Service: Pulmonary;;  . FINE NEEDLE ASPIRATION  12/18/2019   Procedure: FINE NEEDLE ASPIRATION;  Surgeon: Garner Nash, DO;  Location: [facility];  Service: Pulmonary;;  . IR IMAGING GUIDED PORT INSERTION  12/27/2019  . PILONIDAL CYST EXCISION    . VIDEO BRONCHOSCOPY WITH ENDOBRONCHIAL ULTRASOUND Right 12/18/2019   Procedure: VIDEO BRONCHOSCOPY;  Surgeon: Garner Nash, DO;  Location: [facility];  Service: Pulmonary;  Laterality: Right;     Social History   reports that he has been smoking. He has a  120.00 pack-year smoking history. He has never used smokeless tobacco. He reports current alcohol use. He reports that he does not use drugs.   Family History   His family history includes CAD in his mother. There is no history of Stroke, Diabetes, Cancer, Colon cancer, Esophageal cancer, Stomach cancer, or Rectal cancer.   Allergies No Known Allergies   Home Medications  Prior to Admission medications   Medication Sig Start Date End Date Taking? Authorizing Provider  albuterol (PROVENTIL) (2.5 MG/3ML) 0.083% nebulizer solution Take 3 mLs (2.5 mg total) by nebulization every 6 (six) hours as needed for wheezing or shortness of breath. 01/09/20   Icard, Octavio Graves, DO  aspirin EC 81 MG tablet Take 1 tablet (81 mg total) by mouth daily. 11/23/19   [name], MD  atorvastatin (LIPITOR) 20 MG tablet Take 1 tablet  (20 mg total) by mouth daily. Patient taking differently: Take 10 mg by mouth in the morning and at bedtime.  11/23/19   [name], MD  chlorpheniramine-HYDROcodone (TUSSIONEX) 10-8 MG/5ML SUER Take 5 mLs by mouth at bedtime as needed for cough. 01/08/20   Gery Pray, MD  diazepam (VALIUM) 5 MG tablet TAKE 1 TABLET (5 MG TOTAL) BY MOUTH DAILY AS NEEDED FOR MUSCLE SPASMS. 11/25/19   [name], MD  HYDROcodone-acetaminophen (NORCO/VICODIN) 5-325 MG tablet Take 1 tablet by mouth every 6 (six) hours as needed for moderate pain. Patient not taking: Reported on 01/08/2020 12/24/19   [name], MD  lidocaine-prilocaine (EMLA) cream Apply to the Port-A-Cath site 30 minutes before treatment 12/24/19   [name], MD  metFORMIN (GLUCOPHAGE) 1000 MG tablet Take 0.5 tablets (500 mg total) by mouth 2 (two) times daily with a meal. 09/05/19   [name], MD  omeprazole (PRILOSEC) 40 MG capsule Take 1 capsule (40 mg total) by mouth daily. For 3 weeks then as needed Patient taking differently: Take 40 mg by mouth daily as needed (acid reflux).  09/04/19   [name], MD  Oxycodone HCl 10 MG TABS Take 1 tablet (10 mg total) by mouth every 4 (four) hours as needed (severe pain). 01/14/20   Gery Pray, MD  prochlorperazine (COMPAZINE) 10 MG tablet Take 1 tablet (10 mg total) by mouth every 6 (six) hours as needed for nausea or vomiting. Patient not taking: Reported on 01/09/2020 12/28/19   [name], MD  prochlorperazine (COMPAZINE) 25 MG suppository Place 1 suppository (25 mg total) rectally every 12 (twelve) hours as needed for nausea or vomiting. Patient not taking: Reported on 01/09/2020 12/24/19   [name], MD  sildenafil (REVATIO) 20 MG tablet Take 20-100 mg by mouth daily as needed (ED).  05/30/19   [provider]     Critical care time: 35 minutes      Roselie Awkward, MD [facility] Pager: [phone] Cell: [phone] If no response, call  [phone]

## 2020-01-20 NOTE — Progress Notes (Signed)
  Echocardiogram 2D Echocardiogram has been performed.  Jceon Alverio G Maysa Lynn 01/20/2020, 11:27 AM

## 2020-01-20 NOTE — Progress Notes (Signed)
Notified by pharmacy that STAT post TPA results were not available for Heparin dosing. This RN called to verify that lab got tubes. Lab verified that they received the patients blood tubes for the PT/INR/CBC/HIV. They confirmed that they received those tubes with collection times of 1115 which would indeed make them the post TPA samples even though the results show up at 0826. Lab states that this was the original time that the orders were placed and that is why the results show under that time and not the collection time. Pharmacy updated on results and working on heparin dosing.

## 2020-01-20 NOTE — Progress Notes (Signed)
ANTICOAGULATION CONSULT NOTE - Initial Consult  Pharmacy Consult for IV heparin to start when aPTT < 80sec after completion of TPA Indication: pulmonary embolus  No Known Allergies  Patient Measurements: Height: 5\' 11"  (180.3 cm) Weight: 119.7 kg (263 lb 14.3 oz) IBW/kg (Calculated) : 75.3 Heparin Dosing Weight: 102 kg  Vital Signs: Temp: 97.6 F (36.4 C) (04/04 1952) Temp Source: Axillary (04/04 1952) BP: 149/97 (04/04 2100) Pulse Rate: 122 (04/04 2100)  Labs: Recent Labs    01/20/20 0631 01/20/20 0826 01/20/20 2038  HGB 12.9* 12.8*  --   HCT 40.3 40.0  --   PLT 129* 130*  --   APTT  --  62*  --   LABPROT  --  18.3* 16.5*  INR  --  1.5* 1.3*  HEPARINUNFRC  --   --  0.11*  CREATININE 0.70  --   --   TROPONINIHS 358*  --   --     Estimated Creatinine Clearance: 135.8 mL/min (by C-G formula based on SCr of 0.7 mg/dL).   Medical History: Past Medical History:  Diagnosis Date  . Arthritis    neck  . Cancer ([place])    lung stage 4   . Diabetes ([place])   . Fatty liver 12/05/2019   By [place] 11/2019  . GERD (gastroesophageal reflux disease)   . 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 High cholesterol   . IBS (irritable bowel syndrome)   . OSA (obstructive sleep apnea) 09/15/2019   Sleep study 12/2017 - severe OSA with AHI 65.6, desat to 76% rec CPAP  . Severe obesity (BMI 35.0-39.9) with comorbidity ([place]) 09/04/2019    Medications:  Scheduled:  Infusions:   Assessment: 54 yo male presented to ED with shortness of breath currently receiving chemo for stage 4 lung cancer. Now with new saddle PE to start IV heparin. Not on anticoagulation prior to admission. Baseline labs drawn. Patient to receive TPA for his PE. Per protocol, 30 min after completion of TPA, will get aPTT, PT/INR and CBC and when aPTT is < 80 sec, will start IV heparin at dose of 14 units/kg/hr with reduced heparin level goal of 0.3-0.5 units/mL for first 24hrs after TPA admin, then can increase  goal to usual 0.3-0.7  2nd shift follow up: - Heparin level @ 20:38 = 0.11 with heparin infusing @ 1400 units/hr; subtherapeutic - No complications of therapy noted  Goal of Therapy:  Heparin level 0.3-0.5 units/mL for 24 hrs s/p TPA then change goal to 0.3-0.7 units/mL Monitor platelets by anticoagulation protocol: Yes   Plan:  Increase heparin gtt to 1600 units/hr  Check heparin level 6 hours after heparin rate increased  Daily CBC  Alonnie Bieker, Toribio Harbour, PharmD 01/20/2020,9:36 PM

## 2020-01-20 NOTE — ED Provider Notes (Signed)
Resumed care of patient at 7 AM from Dr. Florina Ou.  Patient with acute onset of shortness of breath starting around 8 or 9:00 yesterday evening which was not improving despite trying to use inhalers at home.  Patient's cbc and cmP were interpreted by myself without significant findings except for hyperglycemia of 267.  Patient's troponin is elevated at 358.  CT of the chest reviewed and interpreted by myself and radiology with acute central pulmonary embolism with clot beginning at the pulmonary artery outflow tract and settling the pulmonary artery bifurcation with right heart strain.  Patient was placed on 2 L of oxygen with improvement in tachycardia and oxygen saturation.  Spoke with critical care Dr. Lake Bells who feels that patient is a candidate for TPA.  He recently had an MRI of the brain without known brain metastases.  He has had no recent surgical procedures and no prior history of GI bleeding.  After TPA patient will be started on heparin.  CRITICAL CARE Performed by: Verdine Grenfell Total critical care time: 30 minutes Critical care time was exclusive of separately billable procedures and treating other patients. Critical care was necessary to treat or prevent imminent or life-threatening deterioration. Critical care was time spent personally by me on the following activities: development of treatment plan with patient and/or surrogate as well as nursing, discussions with consultants, evaluation of patient's response to treatment, examination of patient, obtaining history from patient or surrogate, ordering and performing treatments and interventions, ordering and review of laboratory studies, ordering and review of radiographic studies, pulse oximetry and re-evaluation of patient's condition.    Blanchie Dessert, MD 01/20/20 [phone]

## 2020-01-20 NOTE — Progress Notes (Addendum)
ANTICOAGULATION CONSULT NOTE - Initial Consult  Pharmacy Consult for IV heparin to start when aPTT < 80sec after completion of TPA Indication: pulmonary embolus  No Known Allergies  Patient Measurements: Height: 5\' 11"  (180.3 cm) Weight: 122 kg (269 lb) IBW/kg (Calculated) : 75.3 Heparin Dosing Weight: 102 kg  Vital Signs: Temp: 97.7 F (36.5 C) (04/04 0523) Temp Source: Oral (04/04 0523) BP: 108/89 (04/04 0800) Pulse Rate: 131 (04/04 0800)  Labs: Recent Labs    01/20/20 0631  HGB 12.9*  HCT 40.3  PLT 129*  CREATININE 0.70  TROPONINIHS 358*    Estimated Creatinine Clearance: 137.1 mL/min (by C-G formula based on SCr of 0.7 mg/dL).   Medical History: Past Medical History:  Diagnosis Date  . Arthritis    neck  . Cancer ([place])    lung stage 4   . Diabetes ([place])   . Fatty liver 12/05/2019   By [place] 11/2019  . GERD (gastroesophageal reflux disease)   . 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 High cholesterol   . IBS (irritable bowel syndrome)   . OSA (obstructive sleep apnea) 09/15/2019   Sleep study 12/2017 - severe OSA with AHI 65.6, desat to 76% rec CPAP  . Severe obesity (BMI 35.0-39.9) with comorbidity ([place]) 09/04/2019    Medications:  Scheduled:  Infusions:   Assessment: 54 yo male presented to ED with shortness of breath currently receiving chemo for stage 4 lung cancer. Now with new saddle PE to start IV heparin. Not on anticoagulation prior to admission. Baseline labs drawn. Patient to receive TPA for his PE. Per protocol, 30 min after completion of TPA, will get aPTT, PT/INR and CBC and when aPTT is < 80 sec, will start IV heparin at dose of 14 units/kg/hr with reduced heparin level goal of 0.3-0.5 units/mL for first 24hrs after TPA admin, then can increase goal to usual 0.3-0.7  Goal of Therapy:  Heparin level 0.3-0.5 units/mL for 24 hrs s/p TPA then change goal to 0.3-0.7 units/mL Monitor platelets by anticoagulation protocol: Yes    Plan:  Once aPTT < 80 sec s/p TPA, will start IV heparin with NO bolus at rate of 1400 units/hr  Check heparin level 6 hours after start of IV heparin  Daily CBC  Kara Mead 01/20/2020,8:17 AM

## 2020-01-20 NOTE — ED Provider Notes (Signed)
[facility] DEPT Provider Note: Georgena Spurling, MD, FACEP  CSN: [id number] MRN: [id number] ARRIVAL: 01/20/20 at [place]: 1223/1223-01   CHIEF COMPLAINT  Shortness of Breath   HISTORY OF PRESENT ILLNESS  01/20/20 6:01 AM Mike [name] is a 54 y.o. male with known stage IV lung cancer status post chemotherapy and currently finishing radiation therapy.  He is here with shortness of breath that began yesterday evening about 9 PM.  The onset was fairly sudden.  It was severe enough to keep him from sleeping and he came to the ED this morning for evaluation.  He denies associated chest pain but he was noted to be tachycardic in the 140s.  He has had swelling in his right leg but that is not acute.  He used an albuterol neb treatment at home without benefit.   Past Medical History:  Diagnosis Date  . Arthritis    neck  . Cancer ([place])    lung stage 4   . Diabetes ([place])   . Fatty liver 12/05/2019   By [place] 11/2019  . GERD (gastroesophageal reflux disease)   . 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 High cholesterol   . IBS (irritable bowel syndrome)   . OSA (obstructive sleep apnea) 09/15/2019   Sleep study 12/2017 - severe OSA with AHI 65.6, desat to 76% rec CPAP  . Severe obesity (BMI 35.0-39.9) with comorbidity ([place]) 09/04/2019    Past Surgical History:  Procedure Laterality Date  . BIOPSY  12/18/2019   Procedure: BIOPSY;  Surgeon: Garner Nash, DO;  Location: [facility];  Service: Pulmonary;;  . BRONCHIAL BRUSHINGS  12/18/2019   Procedure: BRONCHIAL BRUSHINGS;  Surgeon: Garner Nash, DO;  Location: [facility];  Service: Pulmonary;;  . BRONCHIAL WASHINGS  12/18/2019   Procedure: BRONCHIAL WASHINGS;  Surgeon: Garner Nash, DO;  Location: [facility];  Service: Pulmonary;;  . COLONOSCOPY    . CYSTECTOMY     knee- left knee cyst  . ENDOBRONCHIAL ULTRASOUND  12/18/2019   Procedure: ENDOBRONCHIAL ULTRASOUND;  Surgeon: Garner Nash, DO;  Location: [facility];  Service: Pulmonary;;  . FINE NEEDLE ASPIRATION  12/18/2019   Procedure: FINE NEEDLE ASPIRATION;  Surgeon: Garner Nash, DO;  Location: [facility];  Service: Pulmonary;;  . IR IMAGING GUIDED PORT INSERTION  12/27/2019  . PILONIDAL CYST EXCISION    . VIDEO BRONCHOSCOPY WITH ENDOBRONCHIAL ULTRASOUND Right 12/18/2019   Procedure: VIDEO BRONCHOSCOPY;  Surgeon: Garner Nash, DO;  Location: [facility];  Service: Pulmonary;  Laterality: Right;    Family History  Problem Relation Age of Onset  . CAD Mother        stents  . Stroke Neg Hx   . Diabetes Neg Hx   . Cancer Neg Hx   . Colon cancer Neg Hx   . Esophageal cancer Neg Hx   . Stomach cancer Neg Hx   . Rectal cancer Neg Hx     Social History   Tobacco Use  . Smoking status: Current Every Day Smoker    Packs/day: 3.00    Years: 40.00    Pack years: 120.00  . Smokeless tobacco: Never Used  . Tobacco comment: currently smoking less than 0.5ppd  Substance Use Topics  . Alcohol use: Yes    Comment: Rarely  . Drug use: Never    Prior to Admission medications   Medication Sig Start Date End Date Taking? Authorizing Provider  albuterol (PROVENTIL) (2.5 MG/3ML)  0.083% nebulizer solution Take 3 mLs (2.5 mg total) by nebulization every 6 (six) hours as needed for wheezing or shortness of breath. 01/09/20  Yes Icard, Octavio Graves, DO  aspirin EC 81 MG tablet Take 1 tablet (81 mg total) by mouth daily. 11/23/19  Yes [name], MD  atorvastatin (LIPITOR) 20 MG tablet Take 1 tablet (20 mg total) by mouth daily. Patient taking differently: Take 10 mg by mouth in the morning and at bedtime.  11/23/19  Yes [name], MD  chlorpheniramine-HYDROcodone (TUSSIONEX) 10-8 MG/5ML SUER Take 5 mLs by mouth at bedtime as needed for cough. 01/08/20  Yes Gery Pray, MD  diazepam (VALIUM) 5 MG tablet TAKE 1 TABLET (5 MG TOTAL) BY MOUTH DAILY AS NEEDED FOR MUSCLE SPASMS. 11/25/19  Yes [name], MD  lidocaine-prilocaine  (EMLA) cream Apply to the Port-A-Cath site 30 minutes before treatment Patient taking differently: Apply 1 application topically as needed (port access). Apply to the Port-A-Cath site 30 minutes before treatment 12/24/19  Yes [name], MD  metFORMIN (GLUCOPHAGE) 1000 MG tablet Take 0.5 tablets (500 mg total) by mouth 2 (two) times daily with a meal. 09/05/19  Yes [name], MD  omeprazole (PRILOSEC) 40 MG capsule Take 1 capsule (40 mg total) by mouth daily. For 3 weeks then as needed Patient taking differently: Take 40 mg by mouth daily as needed (acid reflux).  09/04/19  Yes [name], MD  Oxycodone HCl 10 MG TABS Take 1 tablet (10 mg total) by mouth every 4 (four) hours as needed (severe pain). 01/14/20  Yes Gery Pray, MD  sildenafil (REVATIO) 20 MG tablet Take 20-100 mg by mouth daily as needed (ED).  05/30/19  Yes [provider]  HYDROcodone-acetaminophen (NORCO/VICODIN) 5-325 MG tablet Take 1 tablet by mouth every 6 (six) hours as needed for moderate pain. Patient not taking: Reported on 01/08/2020 12/24/19   [name], MD  prochlorperazine (COMPAZINE) 10 MG tablet Take 1 tablet (10 mg total) by mouth every 6 (six) hours as needed for nausea or vomiting. Patient not taking: Reported on 01/09/2020 12/28/19   [name], MD  prochlorperazine (COMPAZINE) 25 MG suppository Place 1 suppository (25 mg total) rectally every 12 (twelve) hours as needed for nausea or vomiting. Patient not taking: Reported on 01/09/2020 12/24/19   [name], MD    Allergies Patient has no known allergies.   REVIEW OF SYSTEMS  Negative except as noted here or in the History of Present Illness.   PHYSICAL EXAMINATION  Initial Vital Signs Blood pressure (!) 132/92, pulse (!) 139, temperature 97.7 F (36.5 C), temperature source Oral, resp. rate 20, height 5\' 11"  (1.803 m), weight 122 kg, SpO2 94 %.  Examination General: Well-developed, well-nourished male in no  acute distress; appearance consistent with age of record HENT: normocephalic; atraumatic Eyes: pupils equal, round and reactive to light; extraocular muscles intact Neck: supple Heart: regular rate and rhythm; tachycardia; frequent PVCs Lungs: clear to auscultation bilaterally Abdomen: soft; nondistended; nontender; bowel sounds present Extremities: No deformity; full range of motion; pulses normal; +1 edema of lower legs  Neurologic: Awake, alert and oriented; motor function intact in all extremities and symmetric; no facial droop Skin: Warm and dry Psychiatric: Anxious   RESULTS  Summary of this visit's results, reviewed and interpreted by myself:   EKG Interpretation  Date/Time:  Sunday January 20 2020 05:27:09 EDT Ventricular Rate:  142 PR Interval:    QRS Duration: 88 QT Interval:  277 QTC Calculation: 426 R Axis:   -  50 Text Interpretation: Sinus tachycardia Paired ventricular premature complexes Left anterior fascicular block No previous ECGs available Confirmed by Donnel Venuto, Jenny Reichmann [phone]) on 01/20/2020 5:46:34 AM      Laboratory Studies: Results for orders placed or performed during the hospital encounter of 01/20/20 (from the past 24 hour(s))  CBC with Differential/Platelet     Status: Abnormal   Collection Time: 01/20/20  6:31 AM  Result Value Ref Range   WBC 7.6 4.0 - 10.5 K/uL   RBC 4.30 4.22 - 5.81 MIL/uL   Hemoglobin 12.9 (L) 13.0 - 17.0 g/dL   HCT 40.3 39.0 - 52.0 %   MCV 93.7 80.0 - 100.0 fL   MCH 30.0 26.0 - 34.0 pg   MCHC 32.0 30.0 - 36.0 g/dL   RDW 14.2 11.5 - 15.5 %   Platelets 129 (L) 150 - 400 K/uL   nRBC 0.0 0.0 - 0.2 %   Neutrophils Relative % 70 %   Neutro Abs 5.3 1.7 - 7.7 K/uL   Lymphocytes Relative 14 %   Lymphs Abs 1.1 0.7 - 4.0 K/uL   Monocytes Relative 14 %   Monocytes Absolute 1.1 (H) 0.1 - 1.0 K/uL   Eosinophils Relative 0 %   Eosinophils Absolute 0.0 0.0 - 0.5 K/uL   Basophils Relative 1 %   Basophils Absolute 0.1 0.0 - 0.1 K/uL    Immature Granulocytes 1 %   Abs Immature Granulocytes 0.10 (H) 0.00 - 0.07 K/uL  Basic metabolic panel     Status: Abnormal   Collection Time: 01/20/20  6:31 AM  Result Value Ref Range   Sodium 136 135 - 145 mmol/L   Potassium 4.8 3.5 - 5.1 mmol/L   Chloride 102 98 - 111 mmol/L   CO2 25 22 - 32 mmol/L   Glucose, Bld 267 (H) 70 - 99 mg/dL   BUN 13 6 - 20 mg/dL   Creatinine, Ser 0.70 0.61 - 1.24 mg/dL   Calcium 8.7 (L) 8.9 - 10.3 mg/dL   GFR calc non Af Amer >60 >60 mL/min   GFR calc Af Amer >60 >60 mL/min   Anion gap 9 5 - 15  Troponin I (High Sensitivity)     Status: Abnormal   Collection Time: 01/20/20  6:31 AM  Result Value Ref Range   Troponin I (High Sensitivity) 358 (HH) <18 ng/L  Respiratory Panel by RT PCR (Flu A&B, Covid) - Nasopharyngeal Swab     Status: None   Collection Time: 01/20/20  8:25 AM   Specimen: Nasopharyngeal Swab  Result Value Ref Range   SARS Coronavirus 2 by RT PCR NEGATIVE NEGATIVE   Influenza A by PCR NEGATIVE NEGATIVE   Influenza B by PCR NEGATIVE NEGATIVE  APTT     Status: Abnormal   Collection Time: 01/20/20  8:26 AM  Result Value Ref Range   aPTT 62 (H) 24 - 36 seconds  Protime-INR     Status: Abnormal   Collection Time: 01/20/20  8:26 AM  Result Value Ref Range   Prothrombin Time 18.3 (H) 11.4 - 15.2 seconds   INR 1.5 (H) 0.8 - 1.2  CBC     Status: Abnormal   Collection Time: 01/20/20  8:26 AM  Result Value Ref Range   WBC 9.1 4.0 - 10.5 K/uL   RBC 4.33 4.22 - 5.81 MIL/uL   Hemoglobin 12.8 (L) 13.0 - 17.0 g/dL   HCT 40.0 39.0 - 52.0 %   MCV 92.4 80.0 - 100.0 fL   MCH 29.6 26.0 -  34.0 pg   MCHC 32.0 30.0 - 36.0 g/dL   RDW 14.2 11.5 - 15.5 %   Platelets 130 (L) 150 - 400 K/uL   nRBC 0.0 0.0 - 0.2 %  MRSA PCR Screening     Status: None   Collection Time: 01/20/20 10:18 AM   Specimen: Nasal Mucosa; Nasopharyngeal  Result Value Ref Range   MRSA by PCR NEGATIVE NEGATIVE  Glucose, capillary     Status: Abnormal   Collection Time:  01/20/20 11:12 AM  Result Value Ref Range   Glucose-Capillary 197 (H) 70 - 99 mg/dL   Imaging Studies: DG Chest 2 View  Result Date: 01/20/2020 CLINICAL DATA:  Short of breath.  History of lung carcinoma. EXAM: CHEST - 2 VIEW COMPARISON:  12/12/2019 FINDINGS: Cardiac silhouette is normal in size. No mediastinal masses. There is opacity that extends lateral from the right hilum, smaller and less masslike than on the prior exam, less evident on the lateral. No left hilar mass. Lungs otherwise clear.  No pleural effusion or pneumothorax. Left anterior chest wall Port-A-Cath is well positioned, new since the prior exam. There is resorption of the lateral aspect of the right eighth rib new or more apparent than it was on the prior exam. IMPRESSION: 1. The central right upper lobe mass noted on prior imaging has decreased in size consistent with a positive response to treatment. 2. There is a lytic lesion of the right lateral eighth rib, not clearly evident on the prior radiograph, but definitively seen on the CT from 12/12/2019. This appears to have increased in size. 3. Remainder of the lungs is clear. No evidence of pneumonia or pulmonary edema. No acute findings. Electronically Signed   By: Lajean Manes M.D.   On: 01/20/2020 05:54   CT Angio Chest PE W and/or Wo Contrast  Result Date: 01/20/2020 CLINICAL DATA:  Severe shortness of breath.  Cancer patient. EXAM: CT ANGIOGRAPHY CHEST WITH CONTRAST TECHNIQUE: Multidetector CT imaging of the chest was performed using the standard protocol during bolus administration of intravenous contrast. Multiplanar CT image reconstructions and MIPs were obtained to evaluate the vascular anatomy. CONTRAST:  29mL OMNIPAQUE IOHEXOL 350 MG/ML SOLN COMPARISON:  12/12/2019 FINDINGS: Cardiovascular: Extensive acute branching pulmonary emboli extending from the pulmonary artery outflow tract and saddling the main pulmonary artery. There is right heart strain with RV to LV ratio of  1.5. No pericardial effusion or generalized cardiac enlargement. No acute aortic finding Mediastinum/Nodes: Right paratracheal adenopathy Lungs/Pleura: Known right upper lobe malignancy measuring 3.7 cm on coronal reformats. No lung infarct or failure. Upper Abdomen: Hepatic venous reflux from elevated right heart pressure. Musculoskeletal: Known osseous metastatic disease with lytic lesions in the sternum and lateral right eighth rib. Review of the MIP images confirms the above findings. Critical Value/emergent results were called by telephone at the time of interpretation on 01/20/2020 at 7:54 am to provider Dr Maryan Rued , who verbally acknowledged these results. IMPRESSION: 1. Acute central pulmonary emboli with clot beginning at the pulmonary artery outflow tract and saddling the pulmonary artery bifurcation. Right heart strain. The presence of right heart strain has been associated with an increased risk of morbidity and mortality. 2. Known bronchogenic carcinoma with nodal and bony metastases. Electronically Signed   By: Monte Fantasia M.D.   On: 01/20/2020 07:57   ECHOCARDIOGRAM COMPLETE  Result Date: 01/20/2020    ECHOCARDIOGRAM REPORT   Patient Name:   [facility] Date of Exam: 01/20/2020 Medical Rec #:  [id number]  Height:       71.0 in Accession #:    [id number]      Weight:       269.0 lb Date of Birth:  1966/06/23       BSA:          2.392 m Patient Age:    50 years        BP:           132/91 mmHg Patient Gender: M               HR:           122 bpm. Exam Location:  Inpatient Procedure: 2D Echo, Cardiac Doppler and Color Doppler Indications:    Dyspnea 786.09/ / R06.00  History:        Patient has no prior history of Echocardiogram examinations.                 Signs/Symptoms:Murmur; Risk Factors:Diabetes, Sleep Apnea and                 Dyslipidemia. Cancer.  Sonographer:    Jonelle Sidle Dance Referring Phys: [place]  1. Normal LV function; mildly dilated aortic root;  mild RVE with severe RV dysfunction.  2. Left ventricular ejection fraction, by estimation, is 55 to 60%. The left ventricle has normal function. The left ventricle has no regional wall motion abnormalities. Left ventricular diastolic parameters were normal.  3. Right ventricular systolic function is severely reduced. The right ventricular size is mildly enlarged.  4. The mitral valve is normal in structure. No evidence of mitral valve regurgitation. No evidence of mitral stenosis.  5. The aortic valve is tricuspid. Aortic valve regurgitation is not visualized. Mild aortic valve sclerosis is present, with no evidence of aortic valve stenosis.  6. Aortic dilatation noted. There is mild dilatation of the aortic root measuring 40 mm.  7. The inferior vena cava is dilated in size with <50% respiratory variability, suggesting right atrial pressure of 15 mmHg. FINDINGS  Left Ventricle: Left ventricular ejection fraction, by estimation, is 55 to 60%. The left ventricle has normal function. The left ventricle has no regional wall motion abnormalities. The left ventricular internal cavity size was normal in size. There is  no left ventricular hypertrophy. Left ventricular diastolic parameters were normal. Right Ventricle: The right ventricular size is mildly enlarged.Right ventricular systolic function is severely reduced. Left Atrium: Left atrial size was normal in size. Right Atrium: Right atrial size was normal in size. Pericardium: Trivial pericardial effusion is present. Mitral Valve: The mitral valve is normal in structure. Normal mobility of the mitral valve leaflets. No evidence of mitral valve regurgitation. No evidence of mitral valve stenosis. Tricuspid Valve: The tricuspid valve is normal in structure. Tricuspid valve regurgitation is trivial. No evidence of tricuspid stenosis. Aortic Valve: The aortic valve is tricuspid. Aortic valve regurgitation is not visualized. Mild aortic valve sclerosis is present, with  no evidence of aortic valve stenosis. Pulmonic Valve: The pulmonic valve was not well visualized. Pulmonic valve regurgitation is not visualized. No evidence of pulmonic stenosis. Aorta: Aortic dilatation noted. There is mild dilatation of the aortic root measuring 40 mm. Venous: The inferior vena cava is dilated in size with less than 50% respiratory variability, suggesting right atrial pressure of 15 mmHg.  Additional Comments: Normal LV function; mildly dilated aortic root; mild RVE with severe RV dysfunction.  LEFT VENTRICLE PLAX 2D LVIDd:  4.40 cm LVIDs:         3.20 cm LV PW:         1.30 cm LV IVS:        1.00 cm LVOT diam:     2.20 cm LV SV:         57 LV SV Index:   24 LVOT Area:     3.80 cm  RIGHT VENTRICLE            IVC RV Basal diam:  3.10 cm    IVC diam: 2.25 cm RV Mid diam:    2.80 cm RV S prime:     9.25 cm/s TAPSE (M-mode): 1.7 cm LEFT ATRIUM             Index       RIGHT ATRIUM           Index LA diam:        3.60 cm 1.51 cm/m  RA Area:     18.90 cm LA Vol (A2C):   39.4 ml 16.47 ml/m RA Volume:   58.60 ml  24.50 ml/m LA Vol (A4C):   48.5 ml 20.28 ml/m LA Biplane Vol: 45.8 ml 19.15 ml/m  AORTIC VALVE LVOT Vmax:   106.50 cm/s LVOT Vmean:  58.150 cm/s LVOT VTI:    0.151 m  AORTA Ao Root diam: 4.00 cm Ao Asc diam:  3.60 cm MITRAL VALVE MV Area (PHT): 5.32 cm     SHUNTS MV Decel Time: 143 msec     Systemic VTI:  0.15 m MV E velocity: 105.00 cm/s  Systemic Diam: 2.20 cm Kirk Ruths MD Electronically signed by Kirk Ruths MD Signature Date/Time: 01/20/2020/11:58:59 AM    Final     ED COURSE and MDM  Nursing notes, initial and subsequent vitals signs, including pulse oximetry, reviewed and interpreted by myself.  Vitals:   01/20/20 1215 01/20/20 1230 01/20/20 1241 01/20/20 1300  BP: 140/87 (!) 128/96  (!) 132/98  Pulse: (!) 124 (!) 123  (!) 119  Resp: (!) 31 (!) 28  (!) 24  Temp:   97.6 F (36.4 C)   TempSrc:   Oral   SpO2: 95% 92%  92%  Weight:      Height:        Medications  pantoprazole (PROTONIX) EC tablet 40 mg (40 mg Oral Given 01/20/20 1134)  metFORMIN (GLUCOPHAGE) tablet 500 mg (has no administration in time range)  diazepam (VALIUM) tablet 5 mg (has no administration in time range)  atorvastatin (LIPITOR) tablet 20 mg (20 mg Oral Given 01/20/20 1134)  aspirin EC tablet 81 mg (81 mg Oral Given 01/20/20 1135)  chlorpheniramine-HYDROcodone (TUSSIONEX) 10-8 MG/5ML suspension 5 mL (has no administration in time range)  albuterol (PROVENTIL) (2.5 MG/3ML) 0.083% nebulizer solution 2.5 mg (has no administration in time range)  oxyCODONE (Oxy IR/ROXICODONE) immediate release tablet 10 mg (10 mg Oral Given 01/20/20 1134)  acetaminophen (TYLENOL) tablet 650 mg (has no administration in time range)  ondansetron (ZOFRAN) injection 4 mg (has no administration in time range)  insulin aspart (novoLOG) injection 0-9 Units (0 Units Subcutaneous Not Given 01/20/20 1138)  insulin aspart (novoLOG) injection 0-5 Units (has no administration in time range)  Chlorhexidine Gluconate Cloth 2 % PADS 6 each (6 each Topical Given 01/20/20 1135)  sodium chloride flush (NS) 0.9 % injection 10-40 mL (10 mLs Intracatheter Given 01/20/20 1137)  sodium chloride flush (NS) 0.9 % injection 10-40 mL (has no administration in time range)  sodium  chloride flush (NS) 0.9 % injection 10-40 mL (has no administration in time range)  sodium chloride flush (NS) 0.9 % injection 10-40 mL (has no administration in time range)  heparin ADULT infusion 100 units/mL (25000 units/257mL sodium chloride 0.45%) (1,400 Units/hr Intravenous New Bag/Given 01/20/20 1358)  LORazepam (ATIVAN) injection 1 mg (1 mg Intravenous Given 01/20/20 0632)  iohexol (OMNIPAQUE) 350 MG/ML injection 80 mL (80 mLs Intravenous Contrast Given 01/20/20 0736)  sodium chloride (PF) 0.9 % injection (  Given by Other 01/20/20 0755)  0.9 %  sodium chloride infusion (0 mLs Intravenous Stopped 01/20/20 1318)  alteplase (ACTIVASE) 1 mg/mL infusion 100 mg  (0 mg Intravenous Stopped 01/20/20 1058)   6:57 AM CT angio chest pending.  A PE would explain his sudden shortness of breath and tachycardia without significant change in breath sounds or wheezing.   PROCEDURES  Procedures   ED DIAGNOSES     ICD-10-CM   1. Acute saddle pulmonary embolism with acute cor pulmonale (HCC)  I26.02        Kalin Kyler, MD 01/20/20 1437

## 2020-01-20 NOTE — ED Notes (Signed)
PIV and foley catheter placed prior to tPA administration.

## 2020-01-20 NOTE — Progress Notes (Signed)
[facility] PCCM  Per patient request, DNR order written  Roselie Awkward, MD [facility] PCCM Pager: [phone] Cell: [phone] If no response, call [phone]

## 2020-01-20 NOTE — ED Notes (Signed)
Critical care provider at bedside.

## 2020-01-20 NOTE — ED Triage Notes (Signed)
Pt c/o shob starting aprox 2100 last night. Presents in ER after no relief and diffulty sleeping. Hx stg 4 lung cancer with multiple tumors. Used albuterol neb at home with no benefit. Finished radiation last Monday and has 3 chemo appointments left. Presents to er with spo2 97% and hr 144.

## 2020-01-20 NOTE — ED Notes (Signed)
Patient transported to CT 

## 2020-01-21 ENCOUNTER — Inpatient Hospital Stay: Payer: 59

## 2020-01-21 ENCOUNTER — Inpatient Hospital Stay (HOSPITAL_COMMUNITY): Payer: 59

## 2020-01-21 ENCOUNTER — Inpatient Hospital Stay: Payer: 59 | Admitting: Internal Medicine

## 2020-01-21 DIAGNOSIS — C3492 Malignant neoplasm of unspecified part of left bronchus or lung: Secondary | ICD-10-CM

## 2020-01-21 DIAGNOSIS — I2699 Other pulmonary embolism without acute cor pulmonale: Secondary | ICD-10-CM

## 2020-01-21 LAB — GLUCOSE, CAPILLARY
Glucose-Capillary: 179 mg/dL — ABNORMAL HIGH (ref 70–99)
Glucose-Capillary: 149 mg/dL — ABNORMAL HIGH (ref 70–99)
Glucose-Capillary: 140 mg/dL — ABNORMAL HIGH (ref 70–99)
Glucose-Capillary: 139 mg/dL — ABNORMAL HIGH (ref 70–99)

## 2020-01-21 LAB — BASIC METABOLIC PANEL
Anion gap: 9 (ref 5–15)
BUN: 13 mg/dL (ref 6–20)
CO2: 27 mmol/L (ref 22–32)
Calcium: 8.6 mg/dL — ABNORMAL LOW (ref 8.9–10.3)
Chloride: 104 mmol/L (ref 98–111)
Creatinine, Ser: 0.72 mg/dL (ref 0.61–1.24)
GFR calc Af Amer: >60 mL/min (ref >60)
GFR calc non Af Amer: >60 mL/min (ref >60)
Glucose, Bld: 189 mg/dL — ABNORMAL HIGH (ref 70–99)
Potassium: 4.1 mmol/L (ref 3.5–5.1)
Sodium: 140 mmol/L (ref 135–145)

## 2020-01-21 LAB — PHOSPHORUS: Phosphorus: 3.3 mg/dL (ref 2.5–4.6)

## 2020-01-21 LAB — CBC
HCT: 39.4 % (ref 39.0–52.0)
Hemoglobin: 12.7 g/dL — ABNORMAL LOW (ref 13.0–17.0)
MCH: 29.6 pg (ref 26.0–34.0)
MCHC: 32.2 g/dL (ref 30.0–36.0)
MCV: 91.8 fL (ref 80.0–100.0)
Platelets: 136 10*3/uL — ABNORMAL LOW (ref 150–400)
RBC: 4.29 MIL/uL (ref 4.22–5.81)
RDW: 14.5 % (ref 11.5–15.5)
WBC: 8.2 10*3/uL (ref 4.0–10.5)
nRBC: 0 % (ref 0.0–0.2)

## 2020-01-21 LAB — HEPARIN LEVEL (UNFRACTIONATED)
Heparin Unfractionated: 0.24 IU/mL — ABNORMAL LOW (ref 0.30–0.70)
Heparin Unfractionated: 0.27 IU/mL — ABNORMAL LOW (ref 0.30–0.70)
Heparin Unfractionated: 0.33 IU/mL (ref 0.30–0.70)

## 2020-01-21 LAB — MAGNESIUM: Magnesium: 1.9 mg/dL (ref 1.7–2.4)

## 2020-01-21 LAB — TROPONIN I (HIGH SENSITIVITY): Troponin I (High Sensitivity): 74 ng/L — ABNORMAL HIGH (ref <18)

## 2020-01-21 MED ORDER — SODIUM CHLORIDE 0.9 % IV BOLUS
500.0000 mL | Freq: Once | INTRAVENOUS | Status: AC
  Administered 2020-01-21: 20:00:00 500 mL via INTRAVENOUS

## 2020-01-21 MED ORDER — HEPARIN (PORCINE) 25000 UT/250ML-% IV SOLN: 1700.0000 [IU]/h | INTRAVENOUS | Status: DC

## 2020-01-21 MED ORDER — HEPARIN (PORCINE) 25000 UT/250ML-% IV SOLN
2000.0000 [IU]/h | INTRAVENOUS | Status: DC
  Administered 2020-01-21 – 2020-01-22 (×2): 2000 [IU]/h via INTRAVENOUS
  Filled 2020-01-21: qty 250

## 2020-01-21 MED ORDER — HEPARIN (PORCINE) 25000 UT/250ML-% IV SOLN
1900.0000 [IU]/h | INTRAVENOUS | Status: DC
  Administered 2020-01-21 (×2): 1900 [IU]/h via INTRAVENOUS
  Filled 2020-01-21: qty 250

## 2020-01-21 NOTE — Progress Notes (Signed)
Bilateral lower extremity venous duplex has been completed. Preliminary results can be found in CV Proc through chart review.   01/21/20 9:44 AM Carlos Levering RVT

## 2020-01-21 NOTE — Progress Notes (Signed)
Brief Pharmacy Consult Note - IV Heparin  Labs: heparin level 0.33  A/P: heparin level now therapeutic (goal now 0.3-0.7 since we are > 24hrs since TPA administration has finished) on current IV heparin rate of 1900 units/hr. Per No reported bleeding. Increase IV heparin rate slightly from 1900 to 2000 units/hr and will recheck heparin level in 6 hours after rate change.  Adrian Saran, PharmD, BCPS 01/21/2020 6:50 PM

## 2020-01-21 NOTE — Progress Notes (Addendum)
ANTICOAGULATION CONSULT NOTE - Consult  Pharmacy Consult for IV heparin to start when aPTT < 80sec after completion of TPA Indication: pulmonary embolus  No Known Allergies  Patient Measurements: Height: 5\' 11"  (180.3 cm) Weight: 119.1 kg (262 lb 9.1 oz) IBW/kg (Calculated) : 75.3 Heparin Dosing Weight: 102 kg  Vital Signs: Temp: 97.3 F (36.3 C) (04/05 0342) Temp Source: Axillary (04/05 0342) BP: 126/86 (04/05 0400) Pulse Rate: 116 (04/05 0400)  Labs: Recent Labs    01/20/20 0631 01/20/20 0631 01/20/20 0826 01/20/20 0826 01/20/20 2038 01/21/20 0400  HGB 12.9*   < > 12.8*   < > 12.7* 12.7*  HCT 40.3   < > 40.0  --  38.5* 39.4  PLT 129*   < > 130*  --  140* 136*  APTT  --   --  62*  --   --   --   LABPROT  --   --  18.3*  --  16.5*  --   INR  --   --  1.5*  --  1.3*  --   HEPARINUNFRC  --   --   --   --  0.11* 0.24*  CREATININE 0.70  --   --   --   --  0.72  TROPONINIHS 358*  --   --   --   --   --    < > = values in this interval not displayed.    Estimated Creatinine Clearance: 135.3 mL/min (by C-G formula based on SCr of 0.72 mg/dL).   Medical History: Past Medical History:  Diagnosis Date  . Arthritis    neck  . Cancer ([place])    lung stage 4   . Diabetes ([place])   . Fatty liver 12/05/2019   By [place] 11/2019  . GERD (gastroesophageal reflux disease)   . 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 High cholesterol   . IBS (irritable bowel syndrome)   . OSA (obstructive sleep apnea) 09/15/2019   Sleep study 12/2017 - severe OSA with AHI 65.6, desat to 76% rec CPAP  . Severe obesity (BMI 35.0-39.9) with comorbidity ([place]) 09/04/2019    Medications:  Scheduled:  Infusions:   Assessment: 54 yo male presented to ED with shortness of breath currently receiving chemo for stage 4 lung cancer. Now with new saddle PE to start IV heparin. Not on anticoagulation prior to admission. Baseline labs drawn. Patient to receive TPA for his PE. Per protocol, 30 min after  completion of TPA, will get aPTT, PT/INR and CBC and when aPTT is < 80 sec, will start IV heparin at dose of 14 units/kg/hr with reduced heparin level goal of 0.3-0.5 units/mL for first 24hrs after TPA admin, then can increase goal to usual 0.3-0.7  2nd shift follow up: - Heparin level @ 20:38 = 0.11 with heparin infusing @ 1400 units/hr; subtherapeutic - No complications of therapy noted Today, 4/5 0400 HL = 0.24 below goal, no bleeding or infusion issues per RN.  H/H , plts stable from 4/4  Goal of Therapy:  Heparin level 0.3-0.5 units/mL for 24 hrs s/p TPA then change goal to 0.3-0.7 units/mL Monitor platelets by anticoagulation protocol: Yes   Plan:  Increase heparin gtt to 1700 units/hr  Check heparin level 6 hours after heparin rate increased  Daily CBC  Dorrene German, PharmD 01/21/2020,4:58 AM

## 2020-01-21 NOTE — Progress Notes (Signed)
ANTICOAGULATION CONSULT NOTE - Follow Up Consult  Pharmacy Consult for heparin Indication: acute pulmonary embolus  No Known Allergies  Patient Measurements: Height: 5\' 11"  (180.3 cm) Weight: 119.1 kg (262 lb 9.1 oz) IBW/kg (Calculated) : 75.3 Heparin Dosing Weight: 102 kg  Vital Signs: Temp: 97.7 F (36.5 C) (04/05 0800) Temp Source: Oral (04/05 0800) BP: 131/75 (04/05 0600) Pulse Rate: 116 (04/05 0600)  Labs: Recent Labs    01/20/20 0631 01/20/20 0631 01/20/20 0826 01/20/20 0826 01/20/20 2038 01/21/20 0400  HGB 12.9*   < > 12.8*   < > 12.7* 12.7*  HCT 40.3   < > 40.0  --  38.5* 39.4  PLT 129*   < > 130*  --  140* 136*  APTT  --   --  62*  --   --   --   LABPROT  --   --  18.3*  --  16.5*  --   INR  --   --  1.5*  --  1.3*  --   HEPARINUNFRC  --   --   --   --  0.11* 0.24*  CREATININE 0.70  --   --   --   --  0.72  TROPONINIHS 358*  --   --   --   --   --    < > = values in this interval not displayed.    Estimated Creatinine Clearance: 135.3 mL/min (by C-G formula based on SCr of 0.72 mg/dL).   Assessment: Patient's a 54 y.o M with hx lung cancer on XRT presented to the ED on 4/4 with c/o SOB. Chest CTA on 4/4 showed acute saddled PE with right heart strain. Peripheral tPA 100 mgx1 given on 4/4 at 0900. He's currently on heparin for acute VTE.  - 4/5 LE doppler: neg for DVT  Today, 01/21/2020: - heparin level is now back sub-therapeutic at 0.27 - cbc stable - no bleeding documented  Goal of Therapy:  Heparin level 0.3-0.5 units/ml for the first 24 hours after infusion then 0.3-0.7 Monitor platelets by anticoagulation protocol: Yes      Plan:  - increase heparin drip to 1900 units/hr - check 6 hr heparin level - monitor for s/sx bleeding  Briggette Najarian P 01/21/2020,9:21 AM

## 2020-01-21 NOTE — Progress Notes (Addendum)
NAME:  Mike [name], MRN:  [id number], DOB:  01-08-1966, LOS: 1 ADMISSION DATE:  01/20/2020, CONSULTATION DATE:  4/4 REFERRING MD:  Maryan Rued, CHIEF COMPLAINT:  dyspnea   Brief History   54 y/o male with a history of lung cancer admitted on 4/4 in the setting of acute onset dyspnea from a large PE.  Additional pain in ribs, hip consistent with known malignancy.   Past Medical History  Morbid obesity OSA Hyperlipidemia GERD Fatty liver DM2  Significant Hospital Events   4/04 Admit with dyspnea in setting of PE   Consults:  PCCM   Procedures:    Significant Diagnostic Tests:   MRI brain 3/4 > no metastatic lesions in brain  CT angiogram chest 4/4 > acute central pulmonary embolism with clot starting at R pulmonary artery outflow tract with RH strain, concern for RV clot, known bronchogenic carcinoma and nodal/bony metastases  ECHO 4/4 >> normal LV function, mildly dilated aortic root, mild RVE with severe RV dysfunction, LVEF ~ 55-60%, no RWMA  LE Venous Duplex 4/5 >>   Micro Data:  Influenza A/B 4/4 >> negative COVID 4/4 >> negative  Antimicrobials:     Interim history/subjective:  Afebrile  Glucose range 160-190 I/O - 1.9L UOP, -910ml negative for 24h Pt reports feeling much better.  Denies shortness of breath.  States he still has RLE swelling.   Objective   Blood pressure 131/75, pulse (!) 116, temperature (!) 97.3 F (36.3 C), temperature source Axillary, resp. rate (!) 22, height 5\' 11"  (1.803 m), weight 119.1 kg, SpO2 92 %.        Intake/Output Summary (Last 24 hours) at 01/21/2020 0811 Last data filed at 01/21/2020 0500 Gross per 24 hour  Intake 1017.1 ml  Output 1950 ml  Net -932.9 ml   Filed Weights   01/20/20 0533 01/20/20 0945 01/21/20 0353  Weight: 122 kg 119.7 kg 119.1 kg    Examination: General: adult male lying in bed in NAD HEENT: MM pink/moist, good dentition, [place] O2, anicteric Neuro: AAOx4, speech clear, MAE CV: s1s2 RRR, ST, no  m/r/g PULM:  Non-labored on [place] O2, lungs bilaterally clear GI: soft, bsx4 active  Extremities: warm/dry, R>L lower extremity edema with RLE 1+ pitting Skin: no rashes or lesions  Resolved Hospital Problem list     Assessment & Plan:    Acute Submassive PE with Right Heart Strain, clot extending into RV No contraindications to thrombolytic therapy on admit.  S/p tPA 4/4.   -monitor in ICU -continue heparin gtt per pharmacy 4/5 -consider transition to [facility] on 4/6 -will likely need lifelong anticoagulation  -tele monitoring  -trend troponin -await LE doppler  -wean O2 for sats >90% -monitor for bleeding  RUL Metastatic Lung Cancer  Cancer Pain, Cough  -PRN tussionex for cough, vicodin for pain -valium for muscle spasm   DM II -SSI  -continue metformin   Nausea / Vomiting  -PRN compazine   GERD -continue omeprazole  Best practice:  Diet: carb modified diet  Pain/Anxiety/Delirium protocol (if indicated): n/a VAP protocol (if indicated): yes DVT prophylaxis: heparin gtt GI prophylaxis: n/a Glucose control: DM2, SSI Mobility: bed rest Code Status: DNR Family Communication: Patient updated on plan of care Disposition: transition to SDU, to [facility] as of 4/6  Labs   CBC: Recent Labs  Lab 01/16/20 1152 01/20/20 0631 01/20/20 0826 01/20/20 2038 01/21/20 0400  WBC 3.5* 7.6 9.1 8.1 8.2  NEUTROABS 1.5* 5.3  --   --   --  HGB 12.5* 12.9* 12.8* 12.7* 12.7*  HCT 39.6 40.3 40.0 38.5* 39.4  MCV 92.7 93.7 92.4 92.3 91.8  PLT 198 129* 130* 140* 136*    Basic Metabolic Panel: Recent Labs  Lab 01/16/20 1152 01/20/20 0631 01/21/20 0400  NA 136 136 140  K 4.6 4.8 4.1  CL 100 102 104  CO2 27 25 27   GLUCOSE 207* 267* 189*  BUN 6 13 13   CREATININE 0.77 0.70 0.72  CALCIUM 9.1 8.7* 8.6*  MG  --   --  1.9  PHOS  --   --  3.3   GFR: Estimated Creatinine Clearance: 135.3 mL/min (by C-G formula based on SCr of 0.72 mg/dL). Recent Labs  Lab 01/20/20 0631  01/20/20 0826 01/20/20 2038 01/21/20 0400  WBC 7.6 9.1 8.1 8.2    Liver Function Tests: Recent Labs  Lab 01/16/20 1152  AST 17  ALT 22  ALKPHOS 94  BILITOT 0.2*  PROT 7.1  ALBUMIN 3.2*   No results for input(s): LIPASE, AMYLASE in the last 168 hours. No results for input(s): AMMONIA in the last 168 hours.  ABG No results found for: PHART, PCO2ART, PO2ART, HCO3, TCO2, ACIDBASEDEF, O2SAT   Coagulation Profile: Recent Labs  Lab 01/20/20 0826 01/20/20 2038  INR 1.5* 1.3*    Cardiac Enzymes: No results for input(s): CKTOTAL, CKMB, CKMBINDEX, TROPONINI in the last 168 hours.  HbA1C: Hemoglobin A1C  Date/Time Value Ref Range Status  12/05/2019 03:28 PM 7.2 (A) 4.0 - 5.6 % Final   Hgb A1c MFr Bld  Date/Time Value Ref Range Status  09/04/2019 09:55 AM 8.4 (H) 4.6 - 6.5 % Final    Comment:    Glycemic Control Guidelines for People with Diabetes:Non Diabetic:  <6%Goal of Therapy: <7%Additional Action Suggested:  >8%     CBG: Recent Labs  Lab 01/20/20 1112 01/20/20 1609 01/20/20 2105 01/21/20 0746  GLUCAP 197* 155* 160* 179*     Critical care time:      Noe Gens, MSN, NP-C [facility] 01/21/2020, 8:11 AM   Please see [email or web address] for pager details.

## 2020-01-22 DIAGNOSIS — C3491 Malignant neoplasm of unspecified part of right bronchus or lung: Secondary | ICD-10-CM

## 2020-01-22 DIAGNOSIS — E1169 Type 2 diabetes mellitus with other specified complication: Secondary | ICD-10-CM

## 2020-01-22 DIAGNOSIS — K219 Gastro-esophageal reflux disease without esophagitis: Secondary | ICD-10-CM

## 2020-01-22 LAB — URINALYSIS, ROUTINE W REFLEX MICROSCOPIC
Bilirubin Urine: NEGATIVE
Glucose, UA: NEGATIVE mg/dL
Ketones, ur: 20 mg/dL — AB
Nitrite: NEGATIVE
Protein, ur: 100 mg/dL — AB
RBC / HPF: >50 RBC/hpf — ABNORMAL HIGH (ref 0–5)
Specific Gravity, Urine: 1.023 (ref 1.005–1.030)
WBC, UA: >50 WBC/hpf — ABNORMAL HIGH (ref 0–5)
pH: 8 (ref 5.0–8.0)

## 2020-01-22 LAB — BASIC METABOLIC PANEL
Anion gap: 6 (ref 5–15)
BUN: 12 mg/dL (ref 6–20)
CO2: 28 mmol/L (ref 22–32)
Calcium: 8.4 mg/dL — ABNORMAL LOW (ref 8.9–10.3)
Chloride: 104 mmol/L (ref 98–111)
Creatinine, Ser: 0.76 mg/dL (ref 0.61–1.24)
GFR calc Af Amer: >60 mL/min (ref >60)
GFR calc non Af Amer: >60 mL/min (ref >60)
Glucose, Bld: 151 mg/dL — ABNORMAL HIGH (ref 70–99)
Potassium: 3.9 mmol/L (ref 3.5–5.1)
Sodium: 138 mmol/L (ref 135–145)

## 2020-01-22 LAB — CBC
HCT: 37.5 % — ABNORMAL LOW (ref 39.0–52.0)
HCT: 38.1 % — ABNORMAL LOW (ref 39.0–52.0)
Hemoglobin: 12 g/dL — ABNORMAL LOW (ref 13.0–17.0)
Hemoglobin: 12.5 g/dL — ABNORMAL LOW (ref 13.0–17.0)
MCH: 29.3 pg (ref 26.0–34.0)
MCH: 29.5 pg (ref 26.0–34.0)
MCHC: 32 g/dL (ref 30.0–36.0)
MCHC: 32.8 g/dL (ref 30.0–36.0)
MCV: 91.7 fL (ref 80.0–100.0)
MCV: 89.9 fL (ref 80.0–100.0)
Platelets: 133 10*3/uL — ABNORMAL LOW (ref 150–400)
Platelets: 130 10*3/uL — ABNORMAL LOW (ref 150–400)
RBC: 4.09 MIL/uL — ABNORMAL LOW (ref 4.22–5.81)
RBC: 4.24 MIL/uL (ref 4.22–5.81)
RDW: 14.2 % (ref 11.5–15.5)
RDW: 14.2 % (ref 11.5–15.5)
WBC: 7.9 10*3/uL (ref 4.0–10.5)
WBC: 13.1 10*3/uL — ABNORMAL HIGH (ref 4.0–10.5)
nRBC: 0 % (ref 0.0–0.2)
nRBC: 0 % (ref 0.0–0.2)

## 2020-01-22 LAB — GLUCOSE, CAPILLARY
Glucose-Capillary: 167 mg/dL — ABNORMAL HIGH (ref 70–99)
Glucose-Capillary: 119 mg/dL — ABNORMAL HIGH (ref 70–99)
Glucose-Capillary: 105 mg/dL — ABNORMAL HIGH (ref 70–99)
Glucose-Capillary: 153 mg/dL — ABNORMAL HIGH (ref 70–99)

## 2020-01-22 LAB — TROPONIN I (HIGH SENSITIVITY)
Troponin I (High Sensitivity): 29 ng/L — ABNORMAL HIGH (ref <18)
Troponin I (High Sensitivity): 31 ng/L — ABNORMAL HIGH (ref <18)

## 2020-01-22 LAB — HEPARIN LEVEL (UNFRACTIONATED)
Heparin Unfractionated: 0.46 IU/mL (ref 0.30–0.70)
Heparin Unfractionated: 0.52 IU/mL (ref 0.30–0.70)

## 2020-01-22 LAB — BRAIN NATRIURETIC PEPTIDE: B Natriuretic Peptide: 108.1 pg/mL — ABNORMAL HIGH (ref 0.0–100.0)

## 2020-01-22 MED ORDER — APIXABAN 5 MG PO TABS
10.0000 mg | ORAL_TABLET | Freq: Two times a day (BID) | ORAL | Status: DC
  Administered 2020-01-22 – 2020-01-23 (×3): 10 mg via ORAL
  Filled 2020-01-22: qty 2
  Filled 2020-01-22: qty 4
  Filled 2020-01-22 (×2): qty 2

## 2020-01-22 MED ORDER — APIXABAN 5 MG PO TABS: 5.0000 mg | ORAL_TABLET | Freq: Two times a day (BID) | ORAL | Status: DC

## 2020-01-22 NOTE — Progress Notes (Signed)
[facility]    Brief Summary: 54 y/o M with hx of lung cancer admitted 4/4 with acute onset dyspnea in setting of large central PE s/p tPA.  He has baseline additional pain in the ribs, hip with known malignancy.   S: Follow up visit with patient - Pt reports central chest discomfort that is unchanged / not new.  Denies SOB.  On RA.   O: Blood pressure 115/69, pulse (!) 110, temperature 97.8 F (36.6 C), temperature source Oral, resp. rate 19, height 5\' 11"  (1.803 m), weight 118.2 kg, SpO2 95 %.  General: adult male sitting up in bed in NAD on RA HEENT: MM pink/moist, no jvd, anicteric  Neuro: AAOx4, speech clear, MAE CV: s1s2 RRR, ST on monitor, no m/r/g PULM:  Mild tachypnea but non-labored on RA, sats 90-93%, lungs clear GI: soft, bsx4 active  Extremities: warm/dry, RLE edema (DVT negative) Skin: no rashes or lesions  LE Duplex 4/5 >> negative   A: Acute PE s/p tPA  Metastatic Lung Cancer   P: -Reassess troponin, BNP -follow O2 needs, work of breathing  -continue heparin gtt -monitor in [place], MSN, NP-C  [facility] 01/22/2020, 10:06 AM   Please see [email or web address] for pager details.

## 2020-01-22 NOTE — Discharge Instructions (Signed)
Information on my medicine - ELIQUIS (apixaban)  This medication education was reviewed with me or my healthcare representative as part of my discharge preparation.   Why was Eliquis prescribed for you? Eliquis was prescribed to treat blood clots that may have been found in the veins of your legs (deep vein thrombosis) or in your lungs (pulmonary embolism) and to reduce the risk of them occurring again.  What do You need to know about Eliquis ? The starting dose is 10 mg (two 5 mg tablets) taken TWICE daily for the FIRST SEVEN (7) DAYS, then on 01/29/20  the dose is reduced to ONE 5 mg tablet taken TWICE daily.  Eliquis may be taken with or without food.   Try to take the dose about the same time in the morning and in the evening. If you have difficulty swallowing the tablet whole please discuss with your pharmacist how to take the medication safely.  Take Eliquis exactly as prescribed and DO NOT stop taking Eliquis without talking to the doctor who prescribed the medication.  Stopping may increase your risk of developing a new blood clot.  Refill your prescription before you run out.  After discharge, you should have regular check-up appointments with your healthcare provider that is prescribing your Eliquis.    What do you do if you miss a dose? If a dose of ELIQUIS is not taken at the scheduled time, take it as soon as possible on the same day and twice-daily administration should be resumed. The dose should not be doubled to make up for a missed dose.  Important Safety Information A possible side effect of Eliquis is bleeding. You should call your healthcare provider right away if you experience any of the following: ? Bleeding from an injury or your nose that does not stop. ? Unusual colored urine (red or dark brown) or unusual colored stools (red or black). ? Unusual bruising for unknown reasons. ? A serious fall or if you hit your head (even if there is no bleeding).  Some  medicines may interact with Eliquis and might increase your risk of bleeding or clotting while on Eliquis. To help avoid this, consult your healthcare provider or pharmacist prior to using any new prescription or non-prescription medications, including herbals, vitamins, non-steroidal anti-inflammatory drugs (NSAIDs) and supplements.  This website has more information on Eliquis (apixaban): http://[email or web address].com/eliquis/home

## 2020-01-22 NOTE — Progress Notes (Signed)
PROGRESS NOTE    Mike [name]  POE:[id number] DOB: 07/29/1966 DOA: 01/20/2020 PCP: [name], MD   Brief Narrative: Mike [name] is a 54 y.o. male with a history of tobacco abuse, lung cancer, diabetes mellitus, GERD. Patient presented secondary to dyspnea and chest discomfort and found to have an acute PE with right heart strain and clot extension into the RV. PCCM consulted for ICU admission and TPA.   Assessment & Plan:   Principal Problem:   Acute pulmonary embolism (HCC) Active Problems:   Type 2 diabetes mellitus with other specified complication (HCC)   Severe obesity (BMI 35.0-39.9) with comorbidity (HCC)   OSA (obstructive sleep apnea)   GERD (gastroesophageal reflux disease)   Bony metastasis (HCC)   Stage IV squamous cell carcinoma of right lung (HCC)   Acute pulmonary embolism Large saddle PE with extension into RV. Associated right heart strain. Transthoracic Echocardiogram significant for severely reduce RV function with mild enlargement. Patient admitted to ICU for tPA which was administered on 4/4. Patient tolerated well and has been stable on heparin IV. Still with tachycardia and hypoxia. LE venous duplex negative for DVT in both left and right LE. -Transition from Heparin drip to Eliquis BID -Daily CBC -PT/OT recs  Acute respiratory failure with hypoxia Secondary to PE. -Wean to room air as able  Diabetes mellitus, type 2 Patient is on metformin as an outpatient -Continue SSI -Resume home metformin 48 hours after any contrast imaging  GERD -Continue Protonix  Hyperlipidemia -Continue Lipitor  Obesity Body mass index is 36.34 kg/m.   Hematuria New today. In setting of dysuria and anticoagulation. Also in setting of recent tPA. -Urinalysis, urine culture -CBC this afternoon  Stage IV squamous cell lung carcinoma Patient follows with Dr. Valeta Harms, Dr. Julien Nordmann, Dr. Sondra Come. Patient is undergoing palliative chemotherapy   DVT  prophylaxis: Heparin IV Code Status:   Code Status: DNR Family Communication: None at bedside Disposition Plan: Likely discharge home in 24-48 hours once stable on anticoagulation and after PT/OT recommendations   Consultants:   PCCM  Procedures:   TRANSTHORACIC ECHOCARDIOGRAM (01/20/2020) IMPRESSIONS    1. Normal LV function; mildly dilated aortic root; mild RVE with severe  RV dysfunction.  2. Left ventricular ejection fraction, by estimation, is 55 to 60%. The  left ventricle has normal function. The left ventricle has no regional  wall motion abnormalities. Left ventricular diastolic parameters were  normal.  3. Right ventricular systolic function is severely reduced. The right  ventricular size is mildly enlarged.  4. The mitral valve is normal in structure. No evidence of mitral valve  regurgitation. No evidence of mitral stenosis.  5. The aortic valve is tricuspid. Aortic valve regurgitation is not  visualized. Mild aortic valve sclerosis is present, with no evidence of  aortic valve stenosis.  6. Aortic dilatation noted. There is mild dilatation of the aortic root  measuring 40 mm.  7. The inferior vena cava is dilated in size with <50% respiratory  variability, suggesting right atrial pressure of 15 mmHg.    LOWER EXTREMITY VENOUS DUPLEX (01/21/2020) Summary:  RIGHT:  - There is no evidence of deep vein thrombosis in the lower extremity.    - No cystic structure found in the popliteal fossa.    LEFT:  - There is no evidence of deep vein thrombosis in the lower extremity.    - No cystic structure found in the popliteal fossa.   Antimicrobials:  None    Subjective: No significant issues overnight.  Eager to get up and mobilize  Objective: Vitals:   01/22/20 0400 01/22/20 0528 01/22/20 0529 01/22/20 0600  BP: 134/72   115/69  Pulse: (!) 116 (!) 109 (!) 109 (!) 110  Resp: (!) 22 19 (!) 23 19  Temp:      TempSrc:      SpO2: 93% 95% 95% 95%    Weight:      Height:        Intake/Output Summary (Last 24 hours) at 01/22/2020 0742 Last data filed at 01/22/2020 0532 Gross per 24 hour  Intake 1089.58 ml  Output 1900 ml  Net -810.42 ml   Filed Weights   01/20/20 0945 01/21/20 0353 01/22/20 0347  Weight: 119.7 kg 119.1 kg 118.2 kg    Examination:  General exam: Appears calm and comfortable Respiratory system: Clear to auscultation. Respiratory effort normal. Cardiovascular system: S1 & S2 heard, Tachycardia, regular rhythm. No murmurs, rubs, gallops or clicks. Gastrointestinal system: Abdomen is nondistended, soft and nontender. No organomegaly or masses felt. Normal bowel sounds heard. Central nervous system: Alert and oriented. No focal neurological deficits. Extremities: Mild LE edema. No calf tenderness Skin: No cyanosis. No rashes Psychiatry: Judgement and insight appear normal. Mood & affect appropriate.     Data Reviewed: I have personally reviewed following labs and imaging studies  CBC: Recent Labs  Lab 01/16/20 1152 01/16/20 1152 01/20/20 0631 01/20/20 0826 01/20/20 2038 01/21/20 0400 01/22/20 0100  WBC 3.5*  --  7.6 9.1 8.1 8.2 7.9  NEUTROABS 1.5*  --  5.3  --   --   --   --   HGB 12.5*  --  12.9* 12.8* 12.7* 12.7* 12.0*  HCT 39.6   < > 40.3 40.0 38.5* 39.4 37.5*  MCV 92.7   < > 93.7 92.4 92.3 91.8 91.7  PLT 198  --  129* 130* 140* 136* 133*   < > = values in this interval not displayed.   Basic Metabolic Panel: Recent Labs  Lab 01/16/20 1152 01/20/20 0631 01/21/20 0400 01/22/20 0100  NA 136 136 140 138  K 4.6 4.8 4.1 3.9  CL 100 102 104 104  CO2 27 25 27 28   GLUCOSE 207* 267* 189* 151*  BUN 6 13 13 12   CREATININE 0.77 0.70 0.72 0.76  CALCIUM 9.1 8.7* 8.6* 8.4*  MG  --   --  1.9  --   PHOS  --   --  3.3  --    GFR: Estimated Creatinine Clearance: 134.9 mL/min (by C-G formula based on SCr of 0.76 mg/dL). Liver Function Tests: Recent Labs  Lab 01/16/20 1152  AST 17  ALT 22  ALKPHOS  94  BILITOT 0.2*  PROT 7.1  ALBUMIN 3.2*   No results for input(s): LIPASE, AMYLASE in the last 168 hours. No results for input(s): AMMONIA in the last 168 hours. Coagulation Profile: Recent Labs  Lab 01/20/20 0826 01/20/20 2038  INR 1.5* 1.3*   Cardiac Enzymes: No results for input(s): CKTOTAL, CKMB, CKMBINDEX, TROPONINI in the last 168 hours. BNP (last 3 results) No results for input(s): PROBNP in the last 8760 hours. HbA1C: No results for input(s): HGBA1C in the last 72 hours. CBG: Recent Labs  Lab 01/21/20 0746 01/21/20 1135 01/21/20 1552 01/21/20 2122 01/22/20 0736  GLUCAP 179* 149* 140* 139* 167*   Lipid Profile: No results for input(s): CHOL, HDL, LDLCALC, TRIG, CHOLHDL, LDLDIRECT in the last 72 hours. Thyroid Function Tests: No results for input(s): TSH, T4TOTAL, FREET4, T3FREE, THYROIDAB in  the last 72 hours. Anemia Panel: No results for input(s): VITAMINB12, FOLATE, FERRITIN, TIBC, IRON, RETICCTPCT in the last 72 hours. Sepsis Labs: No results for input(s): PROCALCITON, LATICACIDVEN in the last 168 hours.  Recent Results (from the past 240 hour(s))  Respiratory Panel by RT PCR (Flu A&B, Covid) - Nasopharyngeal Swab     Status: None   Collection Time: 01/20/20  8:25 AM   Specimen: Nasopharyngeal Swab  Result Value Ref Range Status   SARS Coronavirus 2 by RT PCR NEGATIVE NEGATIVE Final    Comment: (NOTE) SARS-CoV-2 target nucleic acids are NOT DETECTED. The SARS-CoV-2 RNA is generally detectable in upper respiratoy specimens during the acute phase of infection. The lowest concentration of SARS-CoV-2 viral copies this assay can detect is 131 copies/mL. A negative result does not preclude SARS-Cov-2 infection and should not be used as the sole basis for treatment or other patient management decisions. A negative result may occur with  improper specimen collection/handling, submission of specimen other than nasopharyngeal swab, presence of viral mutation(s)  within the areas targeted by this assay, and inadequate number of viral copies (<131 copies/mL). A negative result must be combined with clinical observations, patient history, and epidemiological information. The expected result is Negative. Fact Sheet for Patients:  PinkCheek.be Fact Sheet for Healthcare Providers:  GravelBags.it This test is not yet ap proved or cleared by the [place] FDA and  has been authorized for detection and/or diagnosis of SARS-CoV-2 by FDA under an Emergency Use Authorization (EUA). This EUA will remain  in effect (meaning this test can be used) for the duration of the COVID-19 declaration under Section 564(b)(1) of the Act, 21 U.S.C. section 360bbb-3(b)(1), unless the authorization is terminated or revoked sooner.    Influenza A by PCR NEGATIVE NEGATIVE Final   Influenza B by PCR NEGATIVE NEGATIVE Final    Comment: (NOTE) The Xpert Xpress SARS-CoV-2/FLU/RSV assay is intended as an aid in  the diagnosis of influenza from Nasopharyngeal swab specimens and  should not be used as a sole basis for treatment. Nasal washings and  aspirates are unacceptable for Xpert Xpress SARS-CoV-2/FLU/RSV  testing. Fact Sheet for Patients: PinkCheek.be Fact Sheet for Healthcare Providers: GravelBags.it This test is not yet approved or cleared by the [place] FDA and  has been authorized for detection and/or diagnosis of SARS-CoV-2 by  FDA under an Emergency Use Authorization (EUA). This EUA will remain  in effect (meaning this test can be used) for the duration of the  Covid-19 declaration under Section 564(b)(1) of the Act, 21  U.S.C. section 360bbb-3(b)(1), unless the authorization is  terminated or revoked. Performed at [facility], [place] [address], [place], [place] [address]   MRSA PCR Screening     Status: None    Collection Time: 01/20/20 10:18 AM   Specimen: Nasal Mucosa; Nasopharyngeal  Result Value Ref Range Status   MRSA by PCR NEGATIVE NEGATIVE Final    Comment:        The GeneXpert MRSA Assay (FDA approved for NASAL specimens only), is one component of a comprehensive MRSA colonization surveillance program. It is not intended to diagnose MRSA infection nor to guide or monitor treatment for MRSA infections. Performed at [facility], [place] [address] [place], [place] [address]          Radiology Studies: CT Angio Chest PE W and/or Wo Contrast  Result Date: 01/20/2020 CLINICAL DATA:  Severe shortness of breath.  Cancer patient. EXAM: CT ANGIOGRAPHY CHEST WITH  CONTRAST TECHNIQUE: Multidetector CT imaging of the chest was performed using the standard protocol during bolus administration of intravenous contrast. Multiplanar CT image reconstructions and MIPs were obtained to evaluate the vascular anatomy. CONTRAST:  50mL OMNIPAQUE IOHEXOL 350 MG/ML SOLN COMPARISON:  12/12/2019 FINDINGS: Cardiovascular: Extensive acute branching pulmonary emboli extending from the pulmonary artery outflow tract and saddling the main pulmonary artery. There is right heart strain with RV to LV ratio of 1.5. No pericardial effusion or generalized cardiac enlargement. No acute aortic finding Mediastinum/Nodes: Right paratracheal adenopathy Lungs/Pleura: Known right upper lobe malignancy measuring 3.7 cm on coronal reformats. No lung infarct or failure. Upper Abdomen: Hepatic venous reflux from elevated right heart pressure. Musculoskeletal: Known osseous metastatic disease with lytic lesions in the sternum and lateral right eighth rib. Review of the MIP images confirms the above findings. Critical Value/emergent results were called by telephone at the time of interpretation on 01/20/2020 at 7:54 am to provider Dr Maryan Rued , who verbally acknowledged these results. IMPRESSION: 1. Acute central pulmonary  emboli with clot beginning at the pulmonary artery outflow tract and saddling the pulmonary artery bifurcation. Right heart strain. The presence of right heart strain has been associated with an increased risk of morbidity and mortality. 2. Known bronchogenic carcinoma with nodal and bony metastases. Electronically Signed   By: Monte Fantasia M.D.   On: 01/20/2020 07:57   ECHOCARDIOGRAM COMPLETE  Result Date: 01/20/2020    ECHOCARDIOGRAM REPORT   Patient Name:   Mike [name] [facility] Date of Exam: 01/20/2020 Medical Rec #:  [id number]       Height:       71.0 in Accession #:    [id number]      Weight:       269.0 lb Date of Birth:  08/12/1966       BSA:          2.392 m Patient Age:    57 years        BP:           132/91 mmHg Patient Gender: M               HR:           122 bpm. Exam Location:  Inpatient Procedure: 2D Echo, Cardiac Doppler and Color Doppler Indications:    Dyspnea 786.09/ / R06.00  History:        Patient has no prior history of Echocardiogram examinations.                 Signs/Symptoms:Murmur; Risk Factors:Diabetes, Sleep Apnea and                 Dyslipidemia. Cancer.  Sonographer:    Jonelle Sidle Dance Referring Phys: [place]  1. Normal LV function; mildly dilated aortic root; mild RVE with severe RV dysfunction.  2. Left ventricular ejection fraction, by estimation, is 55 to 60%. The left ventricle has normal function. The left ventricle has no regional wall motion abnormalities. Left ventricular diastolic parameters were normal.  3. Right ventricular systolic function is severely reduced. The right ventricular size is mildly enlarged.  4. The mitral valve is normal in structure. No evidence of mitral valve regurgitation. No evidence of mitral stenosis.  5. The aortic valve is tricuspid. Aortic valve regurgitation is not visualized. Mild aortic valve sclerosis is present, with no evidence of aortic valve stenosis.  6. Aortic dilatation noted. There is mild dilatation of the  aortic root measuring 40 mm.  7. The inferior vena cava is dilated in size with <50% respiratory variability, suggesting right atrial pressure of 15 mmHg. FINDINGS  Left Ventricle: Left ventricular ejection fraction, by estimation, is 55 to 60%. The left ventricle has normal function. The left ventricle has no regional wall motion abnormalities. The left ventricular internal cavity size was normal in size. There is  no left ventricular hypertrophy. Left ventricular diastolic parameters were normal. Right Ventricle: The right ventricular size is mildly enlarged.Right ventricular systolic function is severely reduced. Left Atrium: Left atrial size was normal in size. Right Atrium: Right atrial size was normal in size. Pericardium: Trivial pericardial effusion is present. Mitral Valve: The mitral valve is normal in structure. Normal mobility of the mitral valve leaflets. No evidence of mitral valve regurgitation. No evidence of mitral valve stenosis. Tricuspid Valve: The tricuspid valve is normal in structure. Tricuspid valve regurgitation is trivial. No evidence of tricuspid stenosis. Aortic Valve: The aortic valve is tricuspid. Aortic valve regurgitation is not visualized. Mild aortic valve sclerosis is present, with no evidence of aortic valve stenosis. Pulmonic Valve: The pulmonic valve was not well visualized. Pulmonic valve regurgitation is not visualized. No evidence of pulmonic stenosis. Aorta: Aortic dilatation noted. There is mild dilatation of the aortic root measuring 40 mm. Venous: The inferior vena cava is dilated in size with less than 50% respiratory variability, suggesting right atrial pressure of 15 mmHg.  Additional Comments: Normal LV function; mildly dilated aortic root; mild RVE with severe RV dysfunction.  LEFT VENTRICLE PLAX 2D LVIDd:         4.40 cm LVIDs:         3.20 cm LV PW:         1.30 cm LV IVS:        1.00 cm LVOT diam:     2.20 cm LV SV:         57 LV SV Index:   24 LVOT Area:     3.80  cm  RIGHT VENTRICLE            IVC RV Basal diam:  3.10 cm    IVC diam: 2.25 cm RV Mid diam:    2.80 cm RV S prime:     9.25 cm/s TAPSE (M-mode): 1.7 cm LEFT ATRIUM             Index       RIGHT ATRIUM           Index LA diam:        3.60 cm 1.51 cm/m  RA Area:     18.90 cm LA Vol (A2C):   39.4 ml 16.47 ml/m RA Volume:   58.60 ml  24.50 ml/m LA Vol (A4C):   48.5 ml 20.28 ml/m LA Biplane Vol: 45.8 ml 19.15 ml/m  AORTIC VALVE LVOT Vmax:   106.50 cm/s LVOT Vmean:  58.150 cm/s LVOT VTI:    0.151 m  AORTA Ao Root diam: 4.00 cm Ao Asc diam:  3.60 cm MITRAL VALVE MV Area (PHT): 5.32 cm     SHUNTS MV Decel Time: 143 msec     Systemic VTI:  0.15 m MV E velocity: 105.00 cm/s  Systemic Diam: 2.20 cm Kirk Ruths MD Electronically signed by Kirk Ruths MD Signature Date/Time: 01/20/2020/11:58:59 AM    Final    VAS [place] LOWER EXTREMITY VENOUS (DVT)  Result Date: 01/21/2020  Lower Venous DVTStudy Indications: Pulmonary embolism.  Risk Factors: Cancer. Comparison Study: No prior studies. Performing Technologist: Oliver Hum  RVT  Examination Guidelines: A complete evaluation includes B-mode imaging, spectral Doppler, color Doppler, and power Doppler as needed of all accessible portions of each vessel. Bilateral testing is considered an integral part of a complete examination. Limited examinations for reoccurring indications may be performed as noted. The reflux portion of the exam is performed with the patient in reverse Trendelenburg.  +---------+---------------+---------+-----------+----------+--------------+ RIGHT    CompressibilityPhasicitySpontaneityPropertiesThrombus Aging +---------+---------------+---------+-----------+----------+--------------+ CFV      Full           Yes      Yes                                 +---------+---------------+---------+-----------+----------+--------------+ SFJ      Full                                                         +---------+---------------+---------+-----------+----------+--------------+ FV Prox  Full                                                        +---------+---------------+---------+-----------+----------+--------------+ FV Mid   Full                                                        +---------+---------------+---------+-----------+----------+--------------+ FV DistalFull                                                        +---------+---------------+---------+-----------+----------+--------------+ PFV      Full                                                        +---------+---------------+---------+-----------+----------+--------------+ POP      Full           Yes      Yes                                 +---------+---------------+---------+-----------+----------+--------------+ PTV      Full                                                        +---------+---------------+---------+-----------+----------+--------------+ PERO     Full                                                        +---------+---------------+---------+-----------+----------+--------------+   +---------+---------------+---------+-----------+----------+--------------+  LEFT     CompressibilityPhasicitySpontaneityPropertiesThrombus Aging +---------+---------------+---------+-----------+----------+--------------+ CFV      Full           Yes      Yes                                 +---------+---------------+---------+-----------+----------+--------------+ SFJ      Full                                                        +---------+---------------+---------+-----------+----------+--------------+ FV Prox  Full                                                        +---------+---------------+---------+-----------+----------+--------------+ FV Mid   Full                                                         +---------+---------------+---------+-----------+----------+--------------+ FV DistalFull                                                        +---------+---------------+---------+-----------+----------+--------------+ PFV      Full                                                        +---------+---------------+---------+-----------+----------+--------------+ POP      Full           Yes      Yes                                 +---------+---------------+---------+-----------+----------+--------------+ PTV      Full                                                        +---------+---------------+---------+-----------+----------+--------------+ PERO     Full                                                        +---------+---------------+---------+-----------+----------+--------------+     Summary: RIGHT: - There is no evidence of deep vein thrombosis in the lower extremity.  - No cystic structure found in the popliteal fossa.  LEFT: - There is no evidence of deep vein thrombosis in the lower extremity.  - No  cystic structure found in the popliteal fossa.  *See table(s) above for measurements and observations. Electronically signed by Monica Martinez MD on 01/21/2020 at 5:31:28 PM.    Final         Scheduled Meds: . aspirin EC  81 mg Oral Daily  . atorvastatin  20 mg Oral Daily  . Chlorhexidine Gluconate Cloth  6 each Topical Daily  . insulin aspart  0-5 Units Subcutaneous QHS  . insulin aspart  0-9 Units Subcutaneous TID WC  . metFORMIN  500 mg Oral BID WC  . pantoprazole  40 mg Oral Daily  . sodium chloride flush  10-40 mL Intracatheter Q12H   Continuous Infusions: . heparin 2,000 Units/hr (01/22/20 0602)     LOS: 2 days     Cordelia Poche, MD [facility] 01/22/2020, 7:42 AM  If 7PM-7AM, please contact night-coverage [email or web address]

## 2020-01-22 NOTE — Progress Notes (Signed)
ANTICOAGULATION CONSULT NOTE - Follow Up Consult  Pharmacy Consult for Heparin Indication: acute pulmonary embolus  No Known Allergies  Patient Measurements: Height: 5\' 11"  (180.3 cm) Weight: 118.2 kg (260 lb 9.3 oz) IBW/kg (Calculated) : 75.3 Heparin Dosing Weight:   Vital Signs: Temp: 97.9 F (36.6 C) (04/06 0348) Temp Source: Axillary (04/06 0348) BP: 115/69 (04/06 0600) Pulse Rate: 110 (04/06 0600)  Labs: Recent Labs    01/20/20 0631 01/20/20 0631 01/20/20 0826 01/20/20 0826 01/20/20 2038 01/20/20 2038 01/21/20 0400 01/21/20 0400 01/21/20 1100 01/21/20 1200 01/21/20 1800 01/22/20 0100  HGB 12.9*   < > 12.8*   < > 12.7*   < > 12.7*  --   --   --   --  12.0*  HCT 40.3   < > 40.0   < > 38.5*  --  39.4  --   --   --   --  37.5*  PLT 129*   < > 130*   < > 140*  --  136*  --   --   --   --  133*  APTT  --   --  62*  --   --   --   --   --   --   --   --   --   LABPROT  --   --  18.3*  --  16.5*  --   --   --   --   --   --   --   INR  --   --  1.5*  --  1.3*  --   --   --   --   --   --   --   HEPARINUNFRC  --   --   --   --  0.11*   < > 0.24*   < > 0.27*  --  0.33 0.46  CREATININE 0.70  --   --   --   --   --  0.72  --   --   --   --  0.76  TROPONINIHS 358*  --   --   --   --   --   --   --   --  74*  --   --    < > = values in this interval not displayed.    Estimated Creatinine Clearance: 134.9 mL/min (by C-G formula based on SCr of 0.76 mg/dL).   Medications:  Infusions:  . heparin 2,000 Units/hr (01/22/20 0602)    Assessment: Patient with heparin level at goal.  No issues noted.  Goal of Therapy:  Heparin level 0.3-0.7 units/ml Monitor platelets by anticoagulation protocol: Yes   Plan:  Continue heparin drip at current rate Recheck level at 0900  Tyler Deis, Shea Stakes [place] 01/22/2020,6:18 AM

## 2020-01-22 NOTE — Progress Notes (Signed)
Pt arrived to unit. MEWS red RR 24, HR 131-138. Pt reports feeling some SOB, Oxygen applied two liters. Will continue to monitor and report to Hospitalist provider.

## 2020-01-22 NOTE — Progress Notes (Signed)
Pt continues to be red mews since arriving from ICU shortly before shift change, MD aware and no new orders given. CN made aware. Upon assessment, pt is in no distress, in good spirits talking on the phone in his room. Will continue to monitor closely.

## 2020-01-22 NOTE — Progress Notes (Signed)
Report called to [employer], RN on [facility]. Pt notified of transfer. Questions answered.

## 2020-01-22 NOTE — Progress Notes (Addendum)
ANTICOAGULATION CONSULT NOTE - Follow Up Consult  Pharmacy Consult for heparin Indication: acute pulmonary embolus  No Known Allergies  Patient Measurements: Height: 5\' 11"  (180.3 cm) Weight: 118.2 kg (260 lb 9.3 oz) IBW/kg (Calculated) : 75.3 Heparin Dosing Weight: 102 kg  Vital Signs: Temp: 97.8 F (36.6 C) (04/06 0738) Temp Source: Oral (04/06 0738) BP: 115/69 (04/06 0600) Pulse Rate: 110 (04/06 0600)  Labs: Recent Labs    01/20/20 0631 01/20/20 0631 01/20/20 0826 01/20/20 0826 01/20/20 2038 01/20/20 2038 01/21/20 0400 01/21/20 0400 01/21/20 1100 01/21/20 1200 01/21/20 1800 01/22/20 0100  HGB 12.9*   < > 12.8*   < > 12.7*   < > 12.7*  --   --   --   --  12.0*  HCT 40.3   < > 40.0   < > 38.5*  --  39.4  --   --   --   --  37.5*  PLT 129*   < > 130*   < > 140*  --  136*  --   --   --   --  133*  APTT  --   --  62*  --   --   --   --   --   --   --   --   --   LABPROT  --   --  18.3*  --  16.5*  --   --   --   --   --   --   --   INR  --   --  1.5*  --  1.3*  --   --   --   --   --   --   --   HEPARINUNFRC  --   --   --   --  0.11*   < > 0.24*   < > 0.27*  --  0.33 0.46  CREATININE 0.70  --   --   --   --   --  0.72  --   --   --   --  0.76  TROPONINIHS 358*  --   --   --   --   --   --   --   --  74*  --   --    < > = values in this interval not displayed.    Estimated Creatinine Clearance: 134.9 mL/min (by C-G formula based on SCr of 0.76 mg/dL).   Assessment: Patient's a 54 y.o M with hx lung cancer on XRT presented to the ED on 4/4 with c/o SOB. Chest CTA on 4/4 showed acute saddled PE with right heart strain. Peripheral tPA 100 mgx1 given on 4/4 at 0900. He's currently on heparin for acute VTE.  - 4/5 LE doppler: neg for DVT  Today, 01/22/2020: - heparin level remains therapeutic at 0.52 - cbc stable - no bleeding documented  Goal of Therapy:  Heparin level 0.3-0.7 units/ml Monitor platelets by anticoagulation protocol: Yes     Plan:  - continue  heparin drip at 2000 units/hr - daily heparin level - monitor for s/sx bleeding  Lynelle Doctor 01/22/2020,9:00 AM  ______________________________________  Adden: Per Dr. Lonny Prude, transition to Eliquis today - d/c heparin drip - start Eliquis 10 mg BID x7 days, then 5 mg bid  Dia Sitter, PharmD, BCPS 01/22/2020 11:39 AM

## 2020-01-23 ENCOUNTER — Telehealth: Payer: Self-pay

## 2020-01-23 ENCOUNTER — Inpatient Hospital Stay: Payer: 59

## 2020-01-23 DIAGNOSIS — C7951 Secondary malignant neoplasm of bone: Secondary | ICD-10-CM

## 2020-01-23 DIAGNOSIS — I2609 Other pulmonary embolism with acute cor pulmonale: Secondary | ICD-10-CM

## 2020-01-23 LAB — CBC
HCT: 35.2 % — ABNORMAL LOW (ref 39.0–52.0)
Hemoglobin: 11.5 g/dL — ABNORMAL LOW (ref 13.0–17.0)
MCH: 29.9 pg (ref 26.0–34.0)
MCHC: 32.7 g/dL (ref 30.0–36.0)
MCV: 91.4 fL (ref 80.0–100.0)
Platelets: 124 10*3/uL — ABNORMAL LOW (ref 150–400)
RBC: 3.85 MIL/uL — ABNORMAL LOW (ref 4.22–5.81)
RDW: 14.6 % (ref 11.5–15.5)
WBC: 16.6 10*3/uL — ABNORMAL HIGH (ref 4.0–10.5)
nRBC: 0 % (ref 0.0–0.2)

## 2020-01-23 LAB — GLUCOSE, CAPILLARY
Glucose-Capillary: 137 mg/dL — ABNORMAL HIGH (ref 70–99)
Glucose-Capillary: 151 mg/dL — ABNORMAL HIGH (ref 70–99)

## 2020-01-23 MED ORDER — APIXABAN 5 MG PO TABS: ORAL_TABLET | ORAL | 3 refills | Status: DC

## 2020-01-23 MED ORDER — HEPARIN SOD (PORK) LOCK FLUSH 100 UNIT/ML IV SOLN
500.0000 [IU] | INTRAVENOUS | Status: AC | PRN
  Administered 2020-01-23: 500 [IU]
  Filled 2020-01-23: qty 5

## 2020-01-23 NOTE — Telephone Encounter (Signed)
1st attempt- Left HIPAA complaint message on voicemail to return call, need to complete TCM and schedule hospital follow up visit.

## 2020-01-23 NOTE — TOC Transition Note (Signed)
Transition of Care [facility]) - CM/SW Discharge Note   Patient Details  Name: Mike [name] MRN: [id number] Date of Birth: 1966-10-11  [facility] CM/SW Contact:  Dessa Phi, RN Phone Number: 01/23/2020, 12:39 PM   Clinical Narrative:d/c home no needs.       Final next level of care: Home/Self Care Barriers to Discharge: No Barriers Identified   Patient Goals and CMS Choice Patient states their goals for this hospitalization and ongoing recovery are:: go home CMS Medicare.gov Compare Post Acute Care list provided to:: Patient    Discharge Placement                       Discharge Plan and Services   Discharge Planning Services: CM Consult                                 Social Determinants of Health (SDOH) Interventions     Readmission Risk Interventions No flowsheet data found.

## 2020-01-23 NOTE — Evaluation (Signed)
Physical Therapy One Time Evaluation Patient Details Name: Mike [name] MRN: [id number] DOB: [birth year] Today's Date: 01/23/2020   History of Present Illness  54 y.o. male with a history of tobacco abuse, lung cancer, diabetes mellitus, GERD. Patient presented secondary to dyspnea and chest discomfort and found to have an acute PE with right heart strain and clot extension into the RV.  Clinical Impression  Patient evaluated by Physical Therapy with no further acute PT needs identified. All education has been completed and the patient has no further questions. Pt ambulated in hallway and only reports moderate dyspnea; SPO2 monitored and pt not requiring supplemental oxygen.  Pt reports some R LE pain from tumor so encouraged him to ask for physical therapy script from MD if/when needed pending his treatments.  Pt denies need for SPC at this time (would have been recommended for pain control). See below for any follow-up Physical Therapy or equipment needs. PT is signing off. Thank you for this referral.     Follow Up Recommendations No PT follow up    Equipment Recommendations  None recommended by PT    Recommendations for Other Services       Precautions / Restrictions Precautions Precaution Comments: monitor sats      Mobility  Bed Mobility               General bed mobility comments: pt in recliner on arrival  Transfers Overall transfer level: Modified independent                  Ambulation/Gait Ambulation/Gait assistance: Supervision;Modified independent (Device/Increase time) Gait Distance (Feet): 200 Feet Assistive device: None Gait Pattern/deviations: Decreased stride length;Step-through pattern     General Gait Details: moderate dyspnea however pt reports that's the farthest he has walked in a long time; SPO2 monitored and 89-92% on room air, HR 139 bpm (RN notified)  Stairs            Wheelchair Mobility    Modified Rankin (Stroke Patients  Only)       Balance Overall balance assessment: No apparent balance deficits (not formally assessed)                                           Pertinent Vitals/Pain Pain Assessment: Faces Faces Pain Scale: Hurts a little bit Pain Location: R leg (tumor pressing on nerve per pt) Pain Descriptors / Indicators: Shooting Pain Intervention(s): Repositioned;Monitored during session    Home Living Family/patient expects to be discharged to:: Private residence Living Arrangements: Spouse/significant other   Type of Home: House       Home Layout: Able to live on main level with bedroom/bathroom Home Equipment: None      Prior Function Level of Independence: Independent               Hand Dominance        Extremity/Trunk Assessment        Lower Extremity Assessment Lower Extremity Assessment: Overall WFL for tasks assessed;RLE deficits/detail RLE Deficits / Details: pt reports some ROM restrictions due to pain from tumor, strength WFL for tasks assessed       Communication   Communication: No difficulties  Cognition Arousal/Alertness: Awake/alert Behavior During Therapy: WFL for tasks assessed/performed Overall Cognitive Status: Within Functional Limits for tasks assessed  General Comments      Exercises     Assessment/Plan    PT Assessment Patent does not need any further PT services  PT Problem List         PT Treatment Interventions      PT Goals (Current goals can be found in the Care Plan section)  Acute Rehab PT Goals PT Goal Formulation: All assessment and education complete, DC therapy    Frequency     Barriers to discharge        Co-evaluation               AM-PAC PT "6 Clicks" Mobility  Outcome Measure Help needed turning from your back to your side while in a flat bed without using bedrails?: None Help needed moving from lying on your back to sitting  on the side of a flat bed without using bedrails?: None Help needed moving to and from a bed to a chair (including a wheelchair)?: None Help needed standing up from a chair using your arms (e.g., wheelchair or bedside chair)?: None Help needed to walk in hospital room?: None Help needed climbing 3-5 steps with a railing? : A Little 6 Click Score: 23    End of Session   Activity Tolerance: Patient tolerated treatment well Patient left: in chair;with call bell/phone within reach Nurse Communication: Mobility status PT Visit Diagnosis: Difficulty in walking, not elsewhere classified (R26.2)    Time: 0086-7619 PT Time Calculation (min) (ACUTE ONLY): 11 min   Charges:   PT Evaluation $PT Eval Low Complexity: 1 Low         Kati PT, DPT Acute Rehabilitation Services Office: [phone]  Trena Platt 01/23/2020, 12:07 PM

## 2020-01-23 NOTE — Progress Notes (Signed)
Pt to be discharged to home this afternoon. Pt and Pt's Wife given discharge teaching/instructions. Pt and Pt's verbalized all instructions including Medications and schedules of these Medications. AVS with Pt at time of discharge

## 2020-01-23 NOTE — Discharge Summary (Signed)
Physician Discharge Summary   Patient ID: Mike [name] MRN: [id number] DOB/AGE: Dec 31, 1965 54 y.o.  Admit date: 01/20/2020 Discharge date: 01/23/2020  Primary Care Physician:  [name], MD   Recommendations for Outpatient Follow-up:  1. Follow up with PCP in 1-2 weeks 2. Patient started on apixaban for large central PE, likely indefinitely due to known malignancy  Home Health: None  Equipment/Devices:   Discharge Condition: stable  CODE STATUS: FULL  Diet recommendation: Carb modified diet   Discharge Diagnoses:    . Acute pulmonary embolism ([place]), large saddle PE with extension into RV, associated right heart strain Acute respiratory failure with hypoxia . 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 GERD (gastroesophageal reflux disease) . OSA (obstructive sleep apnea) . Severe obesity (BMI 35.0-39.9) with comorbidity ([place]) . Type 2 diabetes mellitus with other specified complication ([place]) . Stage IV squamous cell carcinoma of right lung ([facility])   Consults: Pulmonary critical care    Allergies:  No Known Allergies   DISCHARGE MEDICATIONS: Allergies as of 01/23/2020   No Known Allergies     Medication List    STOP taking these medications   aspirin EC 81 MG tablet     TAKE these medications   albuterol (2.5 MG/3ML) 0.083% nebulizer solution Commonly known as: PROVENTIL Take 3 mLs (2.5 mg total) by nebulization every 6 (six) hours as needed for wheezing or shortness of breath.   apixaban 5 MG Tabs tablet Commonly known as: ELIQUIS Take 2 tabs (10mg ) twice daily for 7 days. On 01/29/20, change to 1 tab (5mg ) twice a day.   atorvastatin 20 MG tablet Commonly known as: LIPITOR Take 1 tablet (20 mg total) by mouth daily. What changed:   how much to take  when to take this   chlorpheniramine-HYDROcodone 10-8 MG/5ML Suer Commonly known as: TUSSIONEX Take 5 mLs by mouth at bedtime as needed for cough.   diazepam 5 MG tablet Commonly known as: VALIUM TAKE 1 TABLET  (5 MG TOTAL) BY MOUTH DAILY AS NEEDED FOR MUSCLE SPASMS.   lidocaine-prilocaine cream Commonly known as: EMLA Apply to the Port-A-Cath site 30 minutes before treatment What changed:   how much to take  how to take this  when to take this  reasons to take this   metFORMIN 1000 MG tablet Commonly known as: GLUCOPHAGE Take 0.5 tablets (500 mg total) by mouth 2 (two) times daily with a meal.   omeprazole 40 MG capsule Commonly known as: PRILOSEC Take 1 capsule (40 mg total) by mouth daily. For 3 weeks then as needed What changed:   when to take this  reasons to take this  additional instructions   Oxycodone HCl 10 MG Tabs Take 1 tablet (10 mg total) by mouth every 4 (four) hours as needed (severe pain).   sildenafil 20 MG tablet Commonly known as: REVATIO Take 20-100 mg by mouth daily as needed (ED).        Brief H and P: For complete details please refer to admission H and P, but in brief Mike [name] is a 54 y.o. male with a history of tobacco abuse, lung cancer, diabetes mellitus, GERD. Patient presented secondary to dyspnea and chest discomfort and found to have an acute PE with right heart strain and clot extension into the RV. PCCM was consulted for ICU admission and TPA.  Hospital Course:   Acute large saddle pulmonary embolism with acute hypoxic respiratory failure in the setting of known malignancy -Patient presented with dyspnea and chest discomfort, found to  have large saddle PE with extension into RV, associated right heart strain. -Patient was admitted to ICU under critical care service.  tPA was administered on 4/4 which patient tolerated well. -He was placed on heparin drip and has been stable.  2D echo showed significantly reduced RV function with mild enlargement. -Patient was transitioned to NOAC's Eliquis, has been tolerating well.  Except complains of mild hematuria on 4/6 -Patient recommended to stop aspirin, continue therapeutic Eliquis.  Avoid  NSAIDs -Patient will follow up with pulmonology and oncology, will likely require indefinite anticoagulation in the setting of known malignancy. -PT OT evaluation and home O2 evaluation was done, did not qualify for home O2, was able to wean off O2  Diabetes mellitus type 2, NIDDM Resume Metformin as an outpatient   GERD Continue PPI  Hyperlipidemia Continue Lipitor   Obesity Body mass index is 36.34 kg/m.   Hematuria -In the setting of TPA, anticoagulation and aspirin on 4/6 -Patient recommended to stop aspirin.  Continue Eliquis only, H&H currently stable   Stage IV squamous cell lung carcinoma Patient follows with Dr. Valeta Harms, Dr. Julien Nordmann, Dr. Sondra Come. Patient is undergoing palliative chemotherapy   Day of Discharge S: No acute complaints this morning, no chest pain.  Overnight no worsening shortness of breath.  BP 115/72 (BP Location: Right Arm)   Pulse (!) 112   Temp 98.6 F (37 C) (Oral)   Resp 20   Ht 5\' 11"  (1.803 m)   Wt 118.5 kg   SpO2 94%   BMI 36.43 kg/m   Physical Exam: General: Alert and awake oriented x3 not in any acute distress. HEENT: anicteric sclera, pupils reactive to light and accommodation CVS: S1-S2 clear no murmur rubs or gallops Chest: clear to auscultation bilaterally, no wheezing rales or rhonchi Abdomen: soft nontender, nondistended, normal bowel sounds Extremities: no cyanosis, clubbing or edema noted bilaterally Neuro: Cranial nerves II-XII intact, no focal neurological deficits    Get Medicines reviewed and adjusted: Please take all your medications with you for your next visit with your Primary MD  Please request your Primary MD to go over all hospital tests and procedure/radiological results at the follow up. Please ask your Primary MD to get all Hospital records sent to his/her office.  If you experience worsening of your admission symptoms, develop shortness of breath, life threatening emergency, suicidal or homicidal  thoughts you must seek medical attention immediately by calling 911 or calling your MD immediately  if symptoms less severe.  You must read complete instructions/literature along with all the possible adverse reactions/side effects for all the Medicines you take and that have been prescribed to you. Take any new Medicines after you have completely understood and accept all the possible adverse reactions/side effects.   Do not drive when taking pain medications.   Do not take more than prescribed Pain, Sleep and Anxiety Medications  Special Instructions: If you have smoked or chewed Tobacco  in the last 2 yrs please stop smoking, stop any regular Alcohol  and or any Recreational drug use.  Wear Seat belts while driving.  Please note  You were cared for by a hospitalist during your hospital stay. Once you are discharged, your primary care physician will handle any further medical issues. Please note that NO REFILLS for any discharge medications will be authorized once you are discharged, as it is imperative that you return to your primary care physician (or establish a relationship with a primary care physician if you do not have one)  for your aftercare needs so that they can reassess your need for medications and monitor your lab values.   The results of significant diagnostics from this hospitalization (including imaging, microbiology, ancillary and laboratory) are listed below for reference.      Procedures/Studies:  DG Chest 2 View  Result Date: 01/20/2020 CLINICAL DATA:  Short of breath.  History of lung carcinoma. EXAM: CHEST - 2 VIEW COMPARISON:  12/12/2019 FINDINGS: Cardiac silhouette is normal in size. No mediastinal masses. There is opacity that extends lateral from the right hilum, smaller and less masslike than on the prior exam, less evident on the lateral. No left hilar mass. Lungs otherwise clear.  No pleural effusion or pneumothorax. Left anterior chest wall Port-A-Cath is well  positioned, new since the prior exam. There is resorption of the lateral aspect of the right eighth rib new or more apparent than it was on the prior exam. IMPRESSION: 1. The central right upper lobe mass noted on prior imaging has decreased in size consistent with a positive response to treatment. 2. There is a lytic lesion of the right lateral eighth rib, not clearly evident on the prior radiograph, but definitively seen on the CT from 12/12/2019. This appears to have increased in size. 3. Remainder of the lungs is clear. No evidence of pneumonia or pulmonary edema. No acute findings. Electronically Signed   By: Lajean Manes M.D.   On: 01/20/2020 05:54   CT Angio Chest PE W and/or Wo Contrast  Result Date: 01/20/2020 CLINICAL DATA:  Severe shortness of breath.  Cancer patient. EXAM: CT ANGIOGRAPHY CHEST WITH CONTRAST TECHNIQUE: Multidetector CT imaging of the chest was performed using the standard protocol during bolus administration of intravenous contrast. Multiplanar CT image reconstructions and MIPs were obtained to evaluate the vascular anatomy. CONTRAST:  79mL OMNIPAQUE IOHEXOL 350 MG/ML SOLN COMPARISON:  12/12/2019 FINDINGS: Cardiovascular: Extensive acute branching pulmonary emboli extending from the pulmonary artery outflow tract and saddling the main pulmonary artery. There is right heart strain with RV to LV ratio of 1.5. No pericardial effusion or generalized cardiac enlargement. No acute aortic finding Mediastinum/Nodes: Right paratracheal adenopathy Lungs/Pleura: Known right upper lobe malignancy measuring 3.7 cm on coronal reformats. No lung infarct or failure. Upper Abdomen: Hepatic venous reflux from elevated right heart pressure. Musculoskeletal: Known osseous metastatic disease with lytic lesions in the sternum and lateral right eighth rib. Review of the MIP images confirms the above findings. Critical Value/emergent results were called by telephone at the time of interpretation on 01/20/2020  at 7:54 am to provider Dr Maryan Rued , who verbally acknowledged these results. IMPRESSION: 1. Acute central pulmonary emboli with clot beginning at the pulmonary artery outflow tract and saddling the pulmonary artery bifurcation. Right heart strain. The presence of right heart strain has been associated with an increased risk of morbidity and mortality. 2. Known bronchogenic carcinoma with nodal and bony metastases. Electronically Signed   By: Monte Fantasia M.D.   On: 01/20/2020 07:57   ECHOCARDIOGRAM COMPLETE  Result Date: 01/20/2020    ECHOCARDIOGRAM REPORT   Patient Name:   Mike [name] [facility] Date of Exam: 01/20/2020 Medical Rec #:  [id number]       Height:       71.0 in Accession #:    [id number]      Weight:       269.0 lb Date of Birth:  08-Dec-1965       BSA:          2.392 m Patient Age:  54 years        BP:           132/91 mmHg Patient Gender: M               HR:           122 bpm. Exam Location:  Inpatient Procedure: 2D Echo, Cardiac Doppler and Color Doppler Indications:    Dyspnea 786.09/ / R06.00  History:        Patient has no prior history of Echocardiogram examinations.                 Signs/Symptoms:Murmur; Risk Factors:Diabetes, Sleep Apnea and                 Dyslipidemia. Cancer.  Sonographer:    Jonelle Sidle Dance Referring Phys: Alpha  1. Normal LV function; mildly dilated aortic root; mild RVE with severe RV dysfunction.  2. Left ventricular ejection fraction, by estimation, is 55 to 60%. The left ventricle has normal function. The left ventricle has no regional wall motion abnormalities. Left ventricular diastolic parameters were normal.  3. Right ventricular systolic function is severely reduced. The right ventricular size is mildly enlarged.  4. The mitral valve is normal in structure. No evidence of mitral valve regurgitation. No evidence of mitral stenosis.  5. The aortic valve is tricuspid. Aortic valve regurgitation is not visualized. Mild aortic valve sclerosis  is present, with no evidence of aortic valve stenosis.  6. Aortic dilatation noted. There is mild dilatation of the aortic root measuring 40 mm.  7. The inferior vena cava is dilated in size with <50% respiratory variability, suggesting right atrial pressure of 15 mmHg. FINDINGS  Left Ventricle: Left ventricular ejection fraction, by estimation, is 55 to 60%. The left ventricle has normal function. The left ventricle has no regional wall motion abnormalities. The left ventricular internal cavity size was normal in size. There is  no left ventricular hypertrophy. Left ventricular diastolic parameters were normal. Right Ventricle: The right ventricular size is mildly enlarged.Right ventricular systolic function is severely reduced. Left Atrium: Left atrial size was normal in size. Right Atrium: Right atrial size was normal in size. Pericardium: Trivial pericardial effusion is present. Mitral Valve: The mitral valve is normal in structure. Normal mobility of the mitral valve leaflets. No evidence of mitral valve regurgitation. No evidence of mitral valve stenosis. Tricuspid Valve: The tricuspid valve is normal in structure. Tricuspid valve regurgitation is trivial. No evidence of tricuspid stenosis. Aortic Valve: The aortic valve is tricuspid. Aortic valve regurgitation is not visualized. Mild aortic valve sclerosis is present, with no evidence of aortic valve stenosis. Pulmonic Valve: The pulmonic valve was not well visualized. Pulmonic valve regurgitation is not visualized. No evidence of pulmonic stenosis. Aorta: Aortic dilatation noted. There is mild dilatation of the aortic root measuring 40 mm. Venous: The inferior vena cava is dilated in size with less than 50% respiratory variability, suggesting right atrial pressure of 15 mmHg.  Additional Comments: Normal LV function; mildly dilated aortic root; mild RVE with severe RV dysfunction.  LEFT VENTRICLE PLAX 2D LVIDd:         4.40 cm LVIDs:         3.20 cm LV PW:          1.30 cm LV IVS:        1.00 cm LVOT diam:     2.20 cm LV SV:         57 LV SV  Index:   24 LVOT Area:     3.80 cm  RIGHT VENTRICLE            IVC RV Basal diam:  3.10 cm    IVC diam: 2.25 cm RV Mid diam:    2.80 cm RV S prime:     9.25 cm/s TAPSE (M-mode): 1.7 cm LEFT ATRIUM             Index       RIGHT ATRIUM           Index LA diam:        3.60 cm 1.51 cm/m  RA Area:     18.90 cm LA Vol (A2C):   39.4 ml 16.47 ml/m RA Volume:   58.60 ml  24.50 ml/m LA Vol (A4C):   48.5 ml 20.28 ml/m LA Biplane Vol: 45.8 ml 19.15 ml/m  AORTIC VALVE LVOT Vmax:   106.50 cm/s LVOT Vmean:  58.150 cm/s LVOT VTI:    0.151 m  AORTA Ao Root diam: 4.00 cm Ao Asc diam:  3.60 cm MITRAL VALVE MV Area (PHT): 5.32 cm     SHUNTS MV Decel Time: 143 msec     Systemic VTI:  0.15 m MV E velocity: 105.00 cm/s  Systemic Diam: 2.20 cm Kirk Ruths MD Electronically signed by Kirk Ruths MD Signature Date/Time: 01/20/2020/11:58:59 AM    Final    IR IMAGING GUIDED PORT INSERTION  Result Date: 12/27/2019 CLINICAL DATA:  Metastatic squamous carcinoma of the right lung and need for porta cath to begin chemotherapy. EXAM: IMPLANTED PORT A CATH PLACEMENT WITH ULTRASOUND AND FLUOROSCOPIC GUIDANCE ANESTHESIA/SEDATION: 4.0 mg IV Versed; 1 Hunt mcg IV Fentanyl Total Moderate Sedation Time:  35 minutes The patient's level of consciousness and physiologic status were continuously monitored during the procedure by Radiology nursing. Additional Medications: 2 g IV Ancef. FLUOROSCOPY TIME:  1 minutes and 30 seconds.  50.6 mGy. PROCEDURE: The procedure, risks, benefits, and alternatives were explained to the patient. Questions regarding the procedure were encouraged and answered. The patient understands and consents to the procedure. A time-out was performed prior to initiating the procedure. Ultrasound was utilized to confirm patency of the left internal jugular vein. The left neck and chest were prepped with chlorhexidine in a sterile fashion, and  a sterile drape was applied covering the operative field. Maximum barrier sterile technique with sterile gowns and gloves were used for the procedure. Local anesthesia was provided with 1% lidocaine. After creating a small venotomy incision, a 21 gauge needle was advanced into the left internal jugular vein under direct, real-time ultrasound guidance. Ultrasound image documentation was performed. After securing guidewire access, an 8 Fr dilator was placed. A J-wire was kinked to measure appropriate catheter length. A subcutaneous port pocket was then created along the upper chest wall utilizing sharp and blunt dissection. Portable cautery was utilized. The pocket was irrigated with sterile saline. A single lumen power injectable port was chosen for placement. The 8 Fr catheter was tunneled from the port pocket site to the venotomy incision. The port was placed in the pocket. External catheter was trimmed to appropriate length based on guidewire measurement. At the venotomy, an 8 Fr peel-away sheath was placed over a guidewire. The catheter was then placed through the sheath and the sheath removed. Final catheter positioning was confirmed and documented with a fluoroscopic spot image. The port was accessed with a needle and aspirated and flushed with heparinized saline. The access needle was removed. The venotomy  and port pocket incisions were closed with subcutaneous 3-0 Monocryl and subcuticular 4-0 Vicryl. Dermabond was applied to both incisions. COMPLICATIONS: COMPLICATIONS None FINDINGS: After catheter placement, the tip lies at the cavo-atrial junction. The catheter aspirates normally and is ready for immediate use. IMPRESSION: Placement of single lumen port a cath via left internal jugular vein. The catheter tip lies at the cavo-atrial junction. A power injectable port a cath was placed and is ready for immediate use. Electronically Signed   By: Aletta Edouard M.D.   On: 12/27/2019 16:57   VAS [place] LOWER  EXTREMITY VENOUS (DVT)  Result Date: 01/21/2020  Lower Venous DVTStudy Indications: Pulmonary embolism.  Risk Factors: Cancer. Comparison Study: No prior studies. Performing Technologist: Oliver Hum RVT  Examination Guidelines: A complete evaluation includes B-mode imaging, spectral Doppler, color Doppler, and power Doppler as needed of all accessible portions of each vessel. Bilateral testing is considered an integral part of a complete examination. Limited examinations for reoccurring indications may be performed as noted. The reflux portion of the exam is performed with the patient in reverse Trendelenburg.  +---------+---------------+---------+-----------+----------+--------------+ RIGHT    CompressibilityPhasicitySpontaneityPropertiesThrombus Aging +---------+---------------+---------+-----------+----------+--------------+ CFV      Full           Yes      Yes                                 +---------+---------------+---------+-----------+----------+--------------+ SFJ      Full                                                        +---------+---------------+---------+-----------+----------+--------------+ FV Prox  Full                                                        +---------+---------------+---------+-----------+----------+--------------+ FV Mid   Full                                                        +---------+---------------+---------+-----------+----------+--------------+ FV DistalFull                                                        +---------+---------------+---------+-----------+----------+--------------+ PFV      Full                                                        +---------+---------------+---------+-----------+----------+--------------+ POP      Full           Yes      Yes                                 +---------+---------------+---------+-----------+----------+--------------+  PTV      Full                                                         +---------+---------------+---------+-----------+----------+--------------+ PERO     Full                                                        +---------+---------------+---------+-----------+----------+--------------+   +---------+---------------+---------+-----------+----------+--------------+ LEFT     CompressibilityPhasicitySpontaneityPropertiesThrombus Aging +---------+---------------+---------+-----------+----------+--------------+ CFV      Full           Yes      Yes                                 +---------+---------------+---------+-----------+----------+--------------+ SFJ      Full                                                        +---------+---------------+---------+-----------+----------+--------------+ FV Prox  Full                                                        +---------+---------------+---------+-----------+----------+--------------+ FV Mid   Full                                                        +---------+---------------+---------+-----------+----------+--------------+ FV DistalFull                                                        +---------+---------------+---------+-----------+----------+--------------+ PFV      Full                                                        +---------+---------------+---------+-----------+----------+--------------+ POP      Full           Yes      Yes                                 +---------+---------------+---------+-----------+----------+--------------+ PTV      Full                                                        +---------+---------------+---------+-----------+----------+--------------+  PERO     Full                                                        +---------+---------------+---------+-----------+----------+--------------+     Summary: RIGHT: - There is no evidence of deep vein thrombosis in the lower  extremity.  - No cystic structure found in the popliteal fossa.  LEFT: - There is no evidence of deep vein thrombosis in the lower extremity.  - No cystic structure found in the popliteal fossa.  *See table(s) above for measurements and observations. Electronically signed by Monica Martinez MD on 01/21/2020 at 5:31:28 PM.    Final       LAB RESULTS: Basic Metabolic Panel: Recent Labs  Lab 01/21/20 0400 01/22/20 0100  NA 140 138  K 4.1 3.9  CL 104 104  CO2 27 28  GLUCOSE 189* 151*  BUN 13 12  CREATININE 0.72 0.76  CALCIUM 8.6* 8.4*  MG 1.9  --   PHOS 3.3  --    Liver Function Tests: No results for input(s): AST, ALT, ALKPHOS, BILITOT, PROT, ALBUMIN in the last 168 hours. No results for input(s): LIPASE, AMYLASE in the last 168 hours. No results for input(s): AMMONIA in the last 168 hours. CBC: Recent Labs  Lab 01/20/20 0631 01/20/20 0826 01/22/20 1700 01/22/20 1700 01/23/20 0525  WBC 7.6   < > 13.1*  --  16.6*  NEUTROABS 5.3  --   --   --   --   HGB 12.9*   < > 12.5*  --  11.5*  HCT 40.3   < > 38.1*  --  35.2*  MCV 93.7   < > 89.9   < > 91.4  PLT 129*   < > 130*  --  124*   < > = values in this interval not displayed.   Cardiac Enzymes: No results for input(s): CKTOTAL, CKMB, CKMBINDEX, TROPONINI in the last 168 hours. BNP: Invalid input(s): POCBNP CBG: Recent Labs  Lab 01/23/20 0750 01/23/20 1136  GLUCAP 137* 151*       Disposition and Follow-up: Discharge Instructions    Diet Carb Modified   Complete by: As directed    Discharge instructions   Complete by: As directed    Please stop aspirin.  While on Eliquis blood thinner, avoid NSAIDs including Aleve, ibuprofen, Motrin etc. Please watch out for any bleeding issues.   Increase activity slowly   Complete by: As directed        DISPOSITION: [place]    [name], MD. Schedule an appointment as soon as possible for a visit in 2 week(s).    Specialty: Family Medicine Contact information: [place] [address] [phone]        [name], MD. Schedule an appointment as soon as possible for a visit in 2 week(s).   Specialty: Oncology Contact information: [place] [address] [phone]        Garner Nash, DO. Schedule an appointment as soon as possible for a visit in 2 week(s).   Specialty: Pulmonary Disease Contact information: [address] [place] [address] [phone]            Time coordinating discharge:  35 minutes  Signed:  Estill Cotta M.D. [facility] 01/23/2020, 11:57 AM

## 2020-01-24 LAB — URINE CULTURE: Culture: 60000 — AB

## 2020-01-24 NOTE — Telephone Encounter (Signed)
Transition Care Management Follow-up Telephone Call  Date of discharge and from where: 01/23/2020, [name]  How have you been since you were released from the hospital? Patient states that he is feeling better since his discharge from the hospital.   Any questions or concerns? No   Items Reviewed:  Did the pt receive and understand the discharge instructions provided? Yes   Medications obtained and verified? Yes   Any new allergies since your discharge? No   Dietary orders reviewed? Yes  Do you have support at home? Yes   Functional Questionnaire: (I = Independent and D = Dependent) ADLs: D  Bathing/Dressing- D  Meal Prep- D  Eating- D  Maintaining continence- D  Transferring/Ambulation- D  Managing Meds- D  Follow up appointments reviewed:   PCP Hospital f/u appt confirmed? Yes  Scheduled to see Dr. Danise Mina on 02/01/2020 @ 11:30 am.  [facility] f/u appt confirmed? Yes  Scheduled to see oncology and pulmonary.  Are transportation arrangements needed? No   If their condition worsens, is the pt aware to call PCP or go to the Emergency Dept.? Yes  Was the patient provided with contact information for the PCP's office or ED? Yes  Was to pt encouraged to call back with questions or concerns? Yes

## 2020-01-25 ENCOUNTER — Other Ambulatory Visit: Payer: Self-pay | Admitting: Family Medicine

## 2020-01-25 MED ORDER — CIPROFLOXACIN HCL 500 MG PO TABS: 500.0000 mg | ORAL_TABLET | Freq: Two times a day (BID) | ORAL | 0 refills | Status: DC

## 2020-01-28 ENCOUNTER — Other Ambulatory Visit: Payer: Self-pay | Admitting: Radiation Oncology

## 2020-01-28 MED ORDER — OXYCODONE HCL 10 MG PO TABS: 10.0000 mg | ORAL_TABLET | ORAL | 0 refills | Status: DC | PRN

## 2020-01-28 NOTE — Telephone Encounter (Signed)
Dr. Sondra Come, It looks like you filled this 2 weeks ago so you may want to deny this prescription.  Thanks! Threasa Beards

## 2020-01-29 ENCOUNTER — Inpatient Hospital Stay: Payer: 59 | Attending: Internal Medicine

## 2020-01-29 ENCOUNTER — Inpatient Hospital Stay: Payer: 59

## 2020-01-29 ENCOUNTER — Other Ambulatory Visit: Payer: Self-pay

## 2020-01-29 DIAGNOSIS — Z5112 Encounter for antineoplastic immunotherapy: Secondary | ICD-10-CM | POA: Diagnosis not present

## 2020-01-29 DIAGNOSIS — E7800 Pure hypercholesterolemia, unspecified: Secondary | ICD-10-CM | POA: Diagnosis not present

## 2020-01-29 DIAGNOSIS — C3491 Malignant neoplasm of unspecified part of right bronchus or lung: Secondary | ICD-10-CM

## 2020-01-29 DIAGNOSIS — Z79899 Other long term (current) drug therapy: Secondary | ICD-10-CM | POA: Diagnosis not present

## 2020-01-29 DIAGNOSIS — Z7901 Long term (current) use of anticoagulants: Secondary | ICD-10-CM | POA: Insufficient documentation

## 2020-01-29 DIAGNOSIS — Z5111 Encounter for antineoplastic chemotherapy: Secondary | ICD-10-CM | POA: Insufficient documentation

## 2020-01-29 DIAGNOSIS — Z95828 Presence of other vascular implants and grafts: Secondary | ICD-10-CM | POA: Insufficient documentation

## 2020-01-29 DIAGNOSIS — C7951 Secondary malignant neoplasm of bone: Secondary | ICD-10-CM | POA: Diagnosis not present

## 2020-01-29 DIAGNOSIS — E1165 Type 2 diabetes mellitus with hyperglycemia: Secondary | ICD-10-CM | POA: Diagnosis not present

## 2020-01-29 DIAGNOSIS — C3411 Malignant neoplasm of upper lobe, right bronchus or lung: Secondary | ICD-10-CM | POA: Diagnosis present

## 2020-01-29 DIAGNOSIS — Z5189 Encounter for other specified aftercare: Secondary | ICD-10-CM | POA: Diagnosis not present

## 2020-01-29 DIAGNOSIS — G893 Neoplasm related pain (acute) (chronic): Secondary | ICD-10-CM | POA: Diagnosis not present

## 2020-01-29 DIAGNOSIS — Z86711 Personal history of pulmonary embolism: Secondary | ICD-10-CM | POA: Insufficient documentation

## 2020-01-29 DIAGNOSIS — E785 Hyperlipidemia, unspecified: Secondary | ICD-10-CM | POA: Insufficient documentation

## 2020-01-29 DIAGNOSIS — Z794 Long term (current) use of insulin: Secondary | ICD-10-CM | POA: Insufficient documentation

## 2020-01-29 DIAGNOSIS — K589 Irritable bowel syndrome without diarrhea: Secondary | ICD-10-CM | POA: Insufficient documentation

## 2020-01-29 DIAGNOSIS — G4733 Obstructive sleep apnea (adult) (pediatric): Secondary | ICD-10-CM | POA: Insufficient documentation

## 2020-01-29 DIAGNOSIS — K219 Gastro-esophageal reflux disease without esophagitis: Secondary | ICD-10-CM | POA: Diagnosis not present

## 2020-01-29 LAB — CBC WITH DIFFERENTIAL (CANCER CENTER ONLY)
Abs Immature Granulocytes: 0.14 10*3/uL — ABNORMAL HIGH (ref 0.00–0.07)
Basophils Absolute: 0.1 10*3/uL (ref 0.0–0.1)
Basophils Relative: 1 %
Eosinophils Absolute: 0.1 10*3/uL (ref 0.0–0.5)
Eosinophils Relative: 1 %
HCT: 39.1 % (ref 39.0–52.0)
Hemoglobin: 12.5 g/dL — ABNORMAL LOW (ref 13.0–17.0)
Immature Granulocytes: 1 %
Lymphocytes Relative: 15 %
Lymphs Abs: 1.6 10*3/uL (ref 0.7–4.0)
MCH: 29.7 pg (ref 26.0–34.0)
MCHC: 32 g/dL (ref 30.0–36.0)
MCV: 92.9 fL (ref 80.0–100.0)
Monocytes Absolute: 0.8 10*3/uL (ref 0.1–1.0)
Monocytes Relative: 7 %
Neutro Abs: 7.5 10*3/uL (ref 1.7–7.7)
Neutrophils Relative %: 75 %
Platelet Count: 197 10*3/uL (ref 150–400)
RBC: 4.21 MIL/uL — ABNORMAL LOW (ref 4.22–5.81)
RDW: 15 % (ref 11.5–15.5)
WBC Count: 10.1 10*3/uL (ref 4.0–10.5)
nRBC: 0 % (ref 0.0–0.2)

## 2020-01-29 LAB — CMP (CANCER CENTER ONLY)
ALT: 29 U/L (ref 0–44)
AST: 22 U/L (ref 15–41)
Albumin: 3.2 g/dL — ABNORMAL LOW (ref 3.5–5.0)
Alkaline Phosphatase: 86 U/L (ref 38–126)
Anion gap: 9 (ref 5–15)
BUN: 12 mg/dL (ref 6–20)
CO2: 28 mmol/L (ref 22–32)
Calcium: 9.1 mg/dL (ref 8.9–10.3)
Chloride: 104 mmol/L (ref 98–111)
Creatinine: 0.85 mg/dL (ref 0.61–1.24)
GFR, Est AFR Am: >60 mL/min (ref >60)
GFR, Estimated: >60 mL/min (ref >60)
Glucose, Bld: 147 mg/dL — ABNORMAL HIGH (ref 70–99)
Potassium: 4.3 mmol/L (ref 3.5–5.1)
Sodium: 141 mmol/L (ref 135–145)
Total Bilirubin: <0.2 mg/dL — ABNORMAL LOW (ref 0.3–1.2)
Total Protein: 7.1 g/dL (ref 6.5–8.1)

## 2020-01-29 LAB — TSH: TSH: 0.161 u[IU]/mL — ABNORMAL LOW (ref 0.320–4.118)

## 2020-01-29 MED ORDER — SODIUM CHLORIDE 0.9% FLUSH
10.0000 mL | Freq: Once | INTRAVENOUS | Status: AC
  Administered 2020-01-29: 10 mL
  Filled 2020-01-29: qty 10

## 2020-01-29 MED ORDER — HEPARIN SOD (PORK) LOCK FLUSH 100 UNIT/ML IV SOLN
500.0000 [IU] | Freq: Once | INTRAVENOUS | Status: AC
  Administered 2020-01-29: 500 [IU]
  Filled 2020-01-29: qty 5

## 2020-01-30 ENCOUNTER — Telehealth: Payer: Self-pay | Admitting: Internal Medicine

## 2020-01-30 NOTE — Telephone Encounter (Signed)
Faxed records to cancer support program of [employer]

## 2020-02-01 ENCOUNTER — Other Ambulatory Visit: Payer: Self-pay

## 2020-02-01 ENCOUNTER — Ambulatory Visit (INDEPENDENT_AMBULATORY_CARE_PROVIDER_SITE_OTHER): Payer: 59 | Admitting: Family Medicine

## 2020-02-01 ENCOUNTER — Encounter: Payer: Self-pay | Admitting: Family Medicine

## 2020-02-01 VITALS — BP 120/62 | HR 104 | Temp 97.6°F | Ht 71.0 in | Wt 257.4 lb

## 2020-02-01 DIAGNOSIS — E1169 Type 2 diabetes mellitus with other specified complication: Secondary | ICD-10-CM | POA: Diagnosis not present

## 2020-02-01 DIAGNOSIS — N390 Urinary tract infection, site not specified: Secondary | ICD-10-CM

## 2020-02-01 DIAGNOSIS — C7951 Secondary malignant neoplasm of bone: Secondary | ICD-10-CM | POA: Diagnosis not present

## 2020-02-01 DIAGNOSIS — Z1612 Extended spectrum beta lactamase (ESBL) resistance: Secondary | ICD-10-CM

## 2020-02-01 DIAGNOSIS — I2602 Saddle embolus of pulmonary artery with acute cor pulmonale: Secondary | ICD-10-CM

## 2020-02-01 DIAGNOSIS — C3491 Malignant neoplasm of unspecified part of right bronchus or lung: Secondary | ICD-10-CM | POA: Diagnosis not present

## 2020-02-01 DIAGNOSIS — R946 Abnormal results of thyroid function studies: Secondary | ICD-10-CM

## 2020-02-01 DIAGNOSIS — Z87891 Personal history of nicotine dependence: Secondary | ICD-10-CM

## 2020-02-01 DIAGNOSIS — B9629 Other Escherichia coli [E. coli] as the cause of diseases classified elsewhere: Secondary | ICD-10-CM

## 2020-02-01 NOTE — Assessment & Plan Note (Signed)
Quit after dx lung cancer earlier this year 2021.

## 2020-02-01 NOTE — Patient Instructions (Addendum)
Touch base with oncology about your thyroid levels.  Good to see you today.  Return as needed.

## 2020-02-01 NOTE — Progress Notes (Signed)
This visit was conducted in person.  BP 120/62 (BP Location: Left Arm, Patient Position: Sitting, Cuff Size: Large)   Pulse (!) 104   Temp 97.6 F (36.4 C) (Temporal)   Ht 5' 11"  (1.803 m)   Wt 257 lb 7 oz (116.8 kg)   SpO2 95%   BMI 35.91 kg/m    CC: hosp f/u visit  Subjective:    Patient ID: Mike [name], male    DOB: 04/03/1966, 54 y.o.   MRN: [id number]  HPI: LIONEL WOODBERRY is a 54 y.o. male presenting on 02/01/2020 for Hospitalization Follow-up and Form Completion (Provided STD paperwork.  Also, paperwork for a denied claim. 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 Received TPA and was admitted to ICU on heparin drip. Echocardiogram showed significantly reduced RV function with mild enlargement. Transitioned from IV heparin to eliquis 29m bid. Aspirin discontinued. Planning to see pulm and onc. Oxygen was weaned prior to discharge.   He did have episode of hematuria during hospitalization that did resolve. UCx grew 60k ESBL E coli. We placed him on 7d cipro course. He feels this has helped as well.   Ongoing R leg pain/weakness in known met to R acetabulum. Overall improvement noted after radiation therapy. Not using cane - doesn't feel he needs this.   Next planned chemotherapy is Monday.  No h/o thyroid disease.  Denies cold intolerance, palpitations.  Endorses chronic diarrhea. Losing hair - attributed to chemo.  Increase water intake.   Admit date: 01/20/2020 Discharge date: 01/23/2020 TCM hosp f/u phone call completed on 01/24/2020  Recommendations for Outpatient Follow-up:  1. Follow up with PCP in 1-2 weeks 2. Patient started on  apixaban for large central PE, likely indefinitely due to known malignancy  Home Health: None  Equipment/Devices:   Discharge Condition: stable  CODE STATUS: FULL  Diet recommendation: Carb modified diet  Consults: Pulmonary critical care Discharge Diagnoses:   . Acute pulmonary embolism ([place], large saddle PE with extension into RV, associated right heart strain Acute respiratory failure with hypoxia . Bony metastasis ([facility] . GERD (gastroesophageal reflux disease) . OSA (obstructive sleep apnea) . Severe obesity (BMI 35.0-39.9) with comorbidity ([facility] . Type 2 diabetes mellitus with other specified complication ([place] . Stage IV squamous cell carcinoma of right lung (HCC)     Relevant past medical, surgical, family and social history reviewed and updated as indicated. Interim medical history since our last visit reviewed. Allergies and medications reviewed and updated. Outpatient Medications Prior to Visit  Medication Sig Dispense Refill  . albuterol (PROVENTIL) (2.5 MG/3ML) 0.083% nebulizer solution Take 3 mLs (2.5 mg total) by nebulization every 6 (six) hours as needed for wheezing or shortness of breath. 75 mL 12  . apixaban (ELIQUIS) 5 MG TABS tablet Take 2 tabs (163m twice daily for 7 days. On 01/29/20, change to 1 tab (78m57mtwice a day. 60 tablet 3  . atorvastatin (LIPITOR) 20 MG tablet Take 1 tablet (20 mg total) by mouth daily. (Patient taking differently: Take 10 mg by mouth in the morning and at bedtime. ) 90 tablet 3  . chlorpheniramine-HYDROcodone (TUSSIONEX) 10-8 MG/5ML SUER Take 5 mLs by mouth at bedtime as  needed for cough. 115 mL 0  . diazepam (VALIUM) 5 MG tablet TAKE 1 TABLET (5 MG TOTAL) BY MOUTH DAILY AS NEEDED FOR MUSCLE SPASMS. 30 tablet 0  . lidocaine-prilocaine (EMLA) cream Apply to the Port-A-Cath site 30 minutes before treatment (Patient taking differently: Apply 1 application topically as needed (port access). Apply to the Port-A-Cath site 30 minutes before  treatment) 30 g 0  . metFORMIN (GLUCOPHAGE) 1000 MG tablet Take 0.5 tablets (500 mg total) by mouth 2 (two) times daily with a meal. 180 tablet 3  . omeprazole (PRILOSEC) 40 MG capsule Take 1 capsule (40 mg total) by mouth daily. For 3 weeks then as needed (Patient taking differently: Take 40 mg by mouth daily as needed (acid reflux). ) 30 capsule 3  . Oxycodone HCl 10 MG TABS Take 1 tablet (10 mg total) by mouth every 4 (four) hours as needed (severe pain). 60 tablet 0  . sildenafil (REVATIO) 20 MG tablet Take 20-100 mg by mouth daily as needed (ED).     . ciprofloxacin (CIPRO) 500 MG tablet Take 1 tablet (500 mg total) by mouth 2 (two) times daily. 14 tablet 0   No facility-administered medications prior to visit.     Per HPI unless specifically indicated in ROS section below Review of Systems Objective:    BP 120/62 (BP Location: Left Arm, Patient Position: Sitting, Cuff Size: Large)   Pulse (!) 104   Temp 97.6 F (36.4 C) (Temporal)   Ht 5' 11"  (1.803 m)   Wt 257 lb 7 oz (116.8 kg)   SpO2 95%   BMI 35.91 kg/m   Wt Readings from Last 3 Encounters:  02/01/20 257 lb 7 oz (116.8 kg)  01/23/20 261 lb 3.2 oz (118.5 kg)  01/09/20 263 lb 3.2 oz (119.4 kg)    Physical Exam Vitals and nursing note reviewed.  Constitutional:      Appearance: Normal appearance. He is not ill-appearing.     Comments: Antalgic gait due to R thigh pain  Eyes:     Extraocular Movements: Extraocular movements intact.     Conjunctiva/sclera: Conjunctivae normal.     Pupils: Pupils are equal, round, and reactive to light.  Cardiovascular:     Rate and Rhythm: Normal rate and regular rhythm.     Pulses: Normal pulses.     Heart sounds: Normal heart sounds. No murmur.  Pulmonary:     Effort: Pulmonary effort is normal. No respiratory distress.     Breath sounds: Normal breath sounds. No wheezing, rhonchi or rales.  Musculoskeletal:     Right lower leg: No edema.     Left lower leg: No edema.  Skin:     General: Skin is warm and dry.     Capillary Refill: Capillary refill takes less than 2 seconds.     Findings: No rash.  Neurological:     Mental Status: He is alert.  Psychiatric:        Mood and Affect: Mood normal.        Behavior: Behavior normal.       Results for orders placed or performed in visit on 01/29/20  CMP ([place] only)  Result Value Ref Range   Sodium 141 135 - 145 mmol/L   Potassium 4.3 3.5 - 5.1 mmol/L   Chloride 104 98 - 111 mmol/L   CO2 28 22 - 32 mmol/L   Glucose, Bld 147 (H) 70 - 99 mg/dL   BUN 12 6 - 20 mg/dL  Creatinine 0.85 0.61 - 1.24 mg/dL   Calcium 9.1 8.9 - 10.3 mg/dL   Total Protein 7.1 6.5 - 8.1 g/dL   Albumin 3.2 (L) 3.5 - 5.0 g/dL   AST 22 15 - 41 U/L   ALT 29 0 - 44 U/L   Alkaline Phosphatase 86 38 - 126 U/L   Total Bilirubin <0.2 (L) 0.3 - 1.2 mg/dL   GFR, Est Non Af Am >60 >60 mL/min   GFR, Est AFR Am >60 >60 mL/min   Anion gap 9 5 - 15  CBC with Differential (Cancer Center Only)  Result Value Ref Range   WBC Count 10.1 4.0 - 10.5 K/uL   RBC 4.21 (L) 4.22 - 5.81 MIL/uL   Hemoglobin 12.5 (L) 13.0 - 17.0 g/dL   HCT 39.1 39.0 - 52.0 %   MCV 92.9 80.0 - 100.0 fL   MCH 29.7 26.0 - 34.0 pg   MCHC 32.0 30.0 - 36.0 g/dL   RDW 15.0 11.5 - 15.5 %   Platelet Count 197 150 - 400 K/uL   nRBC 0.0 0.0 - 0.2 %   Neutrophils Relative % 75 %   Neutro Abs 7.5 1.7 - 7.7 K/uL   Lymphocytes Relative 15 %   Lymphs Abs 1.6 0.7 - 4.0 K/uL   Monocytes Relative 7 %   Monocytes Absolute 0.8 0.1 - 1.0 K/uL   Eosinophils Relative 1 %   Eosinophils Absolute 0.1 0.0 - 0.5 K/uL   Basophils Relative 1 %   Basophils Absolute 0.1 0.0 - 0.1 K/uL   Immature Granulocytes 1 %   Abs Immature Granulocytes 0.14 (H) 0.00 - 0.07 K/uL  TSH  Result Value Ref Range   TSH 0.161 (L) 0.320 - 4.118 uIU/mL   Lab Results  Component Value Date   HGBA1C 7.2 (A) 12/05/2019    Assessment & Plan:  This visit occurred during the SARS-CoV-2 public health emergency.   Safety protocols were in place, including screening questions prior to the visit, additional usage of staff PPE, and extensive cleaning of exam room while observing appropriate contact time as indicated for disinfecting solutions.   Problem List Items Addressed This Visit    UTI due to extended-spectrum beta lactamase (ESBL) producing Escherichia coli    With hematuria. Completed 1 wk cipro for this, seems to be doing better.       Type 2 diabetes mellitus with other specified complication (HCC) (Chronic)    He will clarify what metformin does he is taking at home (?500 vs 1066m bid).       Stage IV squamous cell carcinoma of right lung (HCC)    Appreciate onc and rad onc and pulm care.  Undergoing palliative chemotherapy through port-a-cath.  Completed palliative radiation to acetabulm.  Continue planned treatment.       Ex-smoker    Quit after dx lung cancer earlier this year 2021.       [place]ony metastasis ([place]    Completed palliative radiation therapy to R acetabulum.       Acute pulmonary embolism ([facility]    Recent hospitalization for this - now on likely lifelong eliquis, seems to be tolerating well.  Now off aspirin.       Abnormal thyroid function test    Recently noted during latest hospitalization - no h/o thyroid disease, no significant thyroid symptoms. ?sick euthyroid vs chemo related change - will monitor for now and I did ask him to touch base with onc regarding thyroid function.  Consider updated TFTs next labwork - all low levels would be consistent with nonthyroidal illness.           No orders of the defined types were placed in this encounter.  No orders of the defined types were placed in this encounter.   Patient Instructions  Touch base with oncology about your thyroid levels.  Good to see you today.  Return as needed.    Follow up plan: Return if symptoms worsen or fail to improve.  [name], MD

## 2020-02-01 NOTE — Assessment & Plan Note (Signed)
He will clarify what metformin does he is taking at home (?500 vs 1000mg  bid).

## 2020-02-05 ENCOUNTER — Inpatient Hospital Stay: Payer: 59

## 2020-02-05 ENCOUNTER — Other Ambulatory Visit: Payer: Self-pay

## 2020-02-05 DIAGNOSIS — Z95828 Presence of other vascular implants and grafts: Secondary | ICD-10-CM

## 2020-02-05 DIAGNOSIS — C3491 Malignant neoplasm of unspecified part of right bronchus or lung: Secondary | ICD-10-CM

## 2020-02-05 DIAGNOSIS — Z5112 Encounter for antineoplastic immunotherapy: Secondary | ICD-10-CM | POA: Diagnosis not present

## 2020-02-05 LAB — CBC WITH DIFFERENTIAL (CANCER CENTER ONLY)
Abs Immature Granulocytes: 0.02 10*3/uL (ref 0.00–0.07)
Basophils Absolute: 0 10*3/uL (ref 0.0–0.1)
Basophils Relative: 1 %
Eosinophils Absolute: 0.1 10*3/uL (ref 0.0–0.5)
Eosinophils Relative: 1 %
HCT: 38.7 % — ABNORMAL LOW (ref 39.0–52.0)
Hemoglobin: 12.4 g/dL — ABNORMAL LOW (ref 13.0–17.0)
Immature Granulocytes: 0 %
Lymphocytes Relative: 19 %
Lymphs Abs: 1.1 10*3/uL (ref 0.7–4.0)
MCH: 29.5 pg (ref 26.0–34.0)
MCHC: 32 g/dL (ref 30.0–36.0)
MCV: 91.9 fL (ref 80.0–100.0)
Monocytes Absolute: 0.6 10*3/uL (ref 0.1–1.0)
Monocytes Relative: 11 %
Neutro Abs: 4 10*3/uL (ref 1.7–7.7)
Neutrophils Relative %: 68 %
Platelet Count: 200 10*3/uL (ref 150–400)
RBC: 4.21 MIL/uL — ABNORMAL LOW (ref 4.22–5.81)
RDW: 14.7 % (ref 11.5–15.5)
WBC Count: 5.8 10*3/uL (ref 4.0–10.5)
nRBC: 0 % (ref 0.0–0.2)

## 2020-02-05 LAB — CMP (CANCER CENTER ONLY)
ALT: 37 U/L (ref 0–44)
AST: 25 U/L (ref 15–41)
Albumin: 3.3 g/dL — ABNORMAL LOW (ref 3.5–5.0)
Alkaline Phosphatase: 79 U/L (ref 38–126)
Anion gap: 7 (ref 5–15)
BUN: 10 mg/dL (ref 6–20)
CO2: 27 mmol/L (ref 22–32)
Calcium: 8.8 mg/dL — ABNORMAL LOW (ref 8.9–10.3)
Chloride: 101 mmol/L (ref 98–111)
Creatinine: 0.8 mg/dL (ref 0.61–1.24)
GFR, Est AFR Am: >60 mL/min (ref >60)
GFR, Estimated: >60 mL/min (ref >60)
Glucose, Bld: 227 mg/dL — ABNORMAL HIGH (ref 70–99)
Potassium: 4.7 mmol/L (ref 3.5–5.1)
Sodium: 135 mmol/L (ref 135–145)
Total Bilirubin: 0.3 mg/dL (ref 0.3–1.2)
Total Protein: 6.9 g/dL (ref 6.5–8.1)

## 2020-02-05 MED ORDER — SODIUM CHLORIDE 0.9% FLUSH
10.0000 mL | Freq: Once | INTRAVENOUS | Status: AC
  Administered 2020-02-05: 10 mL
  Filled 2020-02-05: qty 10

## 2020-02-05 MED ORDER — HEPARIN SOD (PORK) LOCK FLUSH 100 UNIT/ML IV SOLN
500.0000 [IU] | Freq: Once | INTRAVENOUS | Status: AC
  Administered 2020-02-05: 500 [IU]
  Filled 2020-02-05: qty 5

## 2020-02-06 NOTE — Progress Notes (Signed)
Pharmacist Chemotherapy Monitoring - Follow Up Assessment    I verify that I have reviewed each item in the below checklist:  . Regimen for the patient is scheduled for the appropriate day and plan matches scheduled date. [name] Appropriate non-routine labs are ordered dependent on drug ordered. . If applicable, additional medications reviewed and ordered per protocol based on lifetime cumulative doses and/or treatment regimen.   Plan for follow-up and/or issues identified: No . I-vent associated with next due treatment: No . MD and/or nursing notified: No  Mike [name] [facility] 02/06/2020 12:48 PM

## 2020-02-08 ENCOUNTER — Telehealth: Payer: Self-pay | Admitting: Family Medicine

## 2020-02-08 NOTE — Telephone Encounter (Signed)
Patient had an office visit with Dr. Darnell Level last Friday.  Patient asked Dr. Darnell Level to fill out paperwork for short term disability and life insurance.  Patient wants to know if paperwork is ready for pick up.

## 2020-02-10 DIAGNOSIS — B9629 Other Escherichia coli [E. coli] as the cause of diseases classified elsewhere: Secondary | ICD-10-CM | POA: Insufficient documentation

## 2020-02-10 DIAGNOSIS — Z1612 Extended spectrum beta lactamase (ESBL) resistance: Secondary | ICD-10-CM | POA: Insufficient documentation

## 2020-02-10 DIAGNOSIS — R946 Abnormal results of thyroid function studies: Secondary | ICD-10-CM | POA: Insufficient documentation

## 2020-02-10 NOTE — Assessment & Plan Note (Addendum)
Appreciate onc and rad onc and pulm care.  Undergoing palliative chemotherapy through port-a-cath.  Completed palliative radiation to acetabulm.  Continue planned treatment.

## 2020-02-10 NOTE — Assessment & Plan Note (Addendum)
Recent hospitalization for this - now on likely lifelong eliquis, seems to be tolerating well.  Now off aspirin.

## 2020-02-10 NOTE — Assessment & Plan Note (Addendum)
Recently noted during latest hospitalization - no h/o thyroid disease, no significant thyroid symptoms. ?sick euthyroid vs chemo related change - will monitor for now and I did ask him to touch base with onc regarding thyroid function.  Consider updated TFTs next labwork - all low levels would be consistent with nonthyroidal illness.

## 2020-02-10 NOTE — Telephone Encounter (Addendum)
Filled and in LIsa's box. Ready for patient to pick up - he needs to sign.  I also sent patient a mychart message.

## 2020-02-10 NOTE — Assessment & Plan Note (Addendum)
Completed palliative radiation therapy to R acetabulum.

## 2020-02-10 NOTE — Assessment & Plan Note (Signed)
With hematuria. Completed 1 wk cipro for this, seems to be doing better.

## 2020-02-11 ENCOUNTER — Other Ambulatory Visit: Payer: Self-pay | Admitting: Radiation Oncology

## 2020-02-11 NOTE — Telephone Encounter (Signed)
Noted  

## 2020-02-11 NOTE — Telephone Encounter (Signed)
I left a detailed message on patient's voice mail letting him know form's ready for pick up.

## 2020-02-11 NOTE — Progress Notes (Signed)
[facility] OFFICE PROGRESS NOTE  [name], MD [place] [address]  DIAGNOSIS: stage IV (T2b, N3, M1C) non-small cell lung cancer, squamous cell carcinoma presented with right upper lobe lung mass in addition to mediastinal and right supraclavicular lymphadenopathy as well as multiple metastatic bone lesions diagnosed in March 2021.  PRIOR THERAPY: Palliative radiotherapy to the painful metastatic bone lesions under the care of Dr. Sondra Come. Last dose expected 01/14/20  CURRENT THERAPY: 1) Palliative systemic chemotherapy with carboplatin for AUC of 5, paclitaxel 175 NG/M2 and Keytruda 200 mg IV every 3 weeks with Neulasta support. First dose 01/03/20. Status post 1 cycle.   INTERVAL HISTORY: Mike [name] 54 y.o. male returns to the clinic for a follow up visit. The patient is feeling well today without any concerning complaints except for occasional right hip pain due to the bone metastasis. He takes oxycodone for this with good relief of his pain. He has needed to take less pain medication recently. He is requesting a refill however in case he needs it. The patient's first treatment was on 01/03/20. He was hospitalized for an acute pulmonary embolism with right heart strain from 4/4-4/7. He is currently on Eliquis.   Today, he denies any fevers, chills, night sweats, or weight loss. He denies chest pain or hemoptysis. He states he has dyspnea on exertion but that it is "nothing out of the ordinary" for him. He reports his mild baseline productive cough. He denies nausea, vomiting, diarrhea, or constipation. Denies any bleeding or bruising. Denies any headaches or visual changes, Denies any rashes or skin changes except for dry skin. He previously had mild itching on his right forearm which has improved at this time. He is here today for evaluation before starting cycle #2.   MEDICAL HISTORY: Past Medical History:  Diagnosis Date  . Arthritis     neck  . Cancer ([place])    lung stage 4   . Diabetes ([place])   . Fatty liver 12/05/2019   By [place] 11/2019  . GERD (gastroesophageal reflux disease)   . 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 High cholesterol   . IBS (irritable bowel syndrome)   . OSA (obstructive sleep apnea) 09/15/2019   Sleep study 12/2017 - severe OSA with AHI 65.6, desat to 76% rec CPAP  . Severe obesity (BMI 35.0-39.9) with comorbidity ([place]) 09/04/2019    ALLERGIES:  has No Known Allergies.  MEDICATIONS:  Current Outpatient Medications  Medication Sig Dispense Refill  . albuterol (PROVENTIL) (2.5 MG/3ML) 0.083% nebulizer solution Take 3 mLs (2.5 mg total) by nebulization every 6 (six) hours as needed for wheezing or shortness of breath. 75 mL 12  . apixaban (ELIQUIS) 5 MG TABS tablet Take 2 tabs (10mg ) twice daily for 7 days. On 01/29/20, change to 1 tab (5mg ) twice a day. 60 tablet 3  . atorvastatin (LIPITOR) 20 MG tablet Take 1 tablet (20 mg total) by mouth daily. (Patient taking differently: Take 10 mg by mouth in the morning and at bedtime. ) 90 tablet 3  . chlorpheniramine-HYDROcodone (TUSSIONEX) 10-8 MG/5ML SUER Take 5 mLs by mouth at bedtime as needed for cough. 115 mL 0  . diazepam (VALIUM) 5 MG tablet TAKE 1 TABLET (5 MG TOTAL) BY MOUTH DAILY AS NEEDED FOR MUSCLE SPASMS. 30 tablet 0  . lidocaine-prilocaine (EMLA) cream Apply to the Port-A-Cath site 30 minutes before treatment (Patient taking differently: Apply 1 application topically as needed (  port access). Apply to the Port-A-Cath site 30 minutes before treatment) 30 g 0  . metFORMIN (GLUCOPHAGE) 1000 MG tablet Take 0.5 tablets (500 mg total) by mouth 2 (two) times daily with a meal. 180 tablet 3  . omeprazole (PRILOSEC) 40 MG capsule Take 1 capsule (40 mg total) by mouth daily. For 3 weeks then as needed (Patient taking differently: Take 40 mg by mouth daily as needed (acid reflux). ) 30 capsule 3  . Oxycodone HCl 10 MG TABS Take 1 tablet (10 mg total) by  mouth every 4 (four) hours as needed (severe pain). 60 tablet 0  . sildenafil (REVATIO) 20 MG tablet Take 20-100 mg by mouth daily as needed (ED).     [name] oxyCODONE-acetaminophen (PERCOCET/ROXICET) 5-325 MG tablet Take 1 tablet by mouth every 6 (six) hours as needed for severe pain. 30 tablet 0   No current facility-administered medications for this visit.    SURGICAL HISTORY:  Past Surgical History:  Procedure Laterality Date  . BIOPSY  12/18/2019   Procedure: BIOPSY;  Surgeon: Garner Nash, DO;  Location: [facility];  Service: Pulmonary;;  . BRONCHIAL BRUSHINGS  12/18/2019   Procedure: BRONCHIAL BRUSHINGS;  Surgeon: Garner Nash, DO;  Location: [facility];  Service: Pulmonary;;  . BRONCHIAL WASHINGS  12/18/2019   Procedure: BRONCHIAL WASHINGS;  Surgeon: Garner Nash, DO;  Location: [facility];  Service: Pulmonary;;  . COLONOSCOPY    . CYSTECTOMY     knee- left knee cyst  . ENDOBRONCHIAL ULTRASOUND  12/18/2019   Procedure: ENDOBRONCHIAL ULTRASOUND;  Surgeon: Garner Nash, DO;  Location: [facility];  Service: Pulmonary;;  . FINE NEEDLE ASPIRATION  12/18/2019   Procedure: FINE NEEDLE ASPIRATION;  Surgeon: Garner Nash, DO;  Location: [facility];  Service: Pulmonary;;  . IR IMAGING GUIDED PORT INSERTION  12/27/2019  . PILONIDAL CYST EXCISION    . VIDEO BRONCHOSCOPY WITH ENDOBRONCHIAL ULTRASOUND Right 12/18/2019   Procedure: VIDEO BRONCHOSCOPY;  Surgeon: Garner Nash, DO;  Location: [facility];  Service: Pulmonary;  Laterality: Right;    REVIEW OF SYSTEMS:   Review of Systems  Constitutional: Negative for appetite change, chills, fatigue, fever and unexpected weight change.  HENT: Negative for mouth sores, nosebleeds, sore throat and trouble swallowing.   Eyes: Negative for eye problems and icterus.  Respiratory: Positive for baseline dyspnea on exertion and baseline cough. Negative for hemoptysis, and wheezing.   Cardiovascular: Negative for chest pain and leg  swelling.  Gastrointestinal: Negative for abdominal pain, constipation, diarrhea, nausea and vomiting.  Genitourinary: Negative for bladder incontinence, difficulty urinating, dysuria, frequency and hematuria.   Musculoskeletal: Positive for occasional right hip pain. Negative for back pain, gait problem, neck pain and neck stiffness.  Skin: Negative for itching and rash.  Neurological: Negative for dizziness, extremity weakness, gait problem, headaches, light-headedness and seizures.  Hematological: Negative for adenopathy. Does not bruise/bleed easily.  Psychiatric/Behavioral: Negative for confusion, depression and sleep disturbance. The patient is not nervous/anxious.     PHYSICAL EXAMINATION:  Blood pressure 137/82, pulse (!) 102, temperature 98.2 F (36.8 C), temperature source Temporal, resp. rate 18, height 5\' 11"  (1.803 m), weight 262 lb 8 oz (119.1 kg), SpO2 97 %.  ECOG PERFORMANCE STATUS: 1 - Symptomatic but completely ambulatory  Physical Exam  Constitutional: Oriented to person, place, and time and well-developed, well-nourished, and in no distress.  HENT:  Head: Normocephalic and atraumatic.  Mouth/Throat: Oropharynx is clear and moist. No oropharyngeal exudate.  Eyes: Conjunctivae are normal. Right  eye exhibits no discharge. Left eye exhibits no discharge. No scleral icterus.  Neck: Normal range of motion. Neck supple.  Cardiovascular: Normal rate, regular rhythm, normal heart sounds and intact distal pulses.   Pulmonary/Chest: Effort normal and breath sounds normal. No respiratory distress. No wheezes. No rales.  Abdominal: Soft. Bowel sounds are normal. Exhibits no distension and no mass. There is no tenderness.  Musculoskeletal: Normal range of motion. Exhibits no edema.  Lymphadenopathy:    No cervical adenopathy.  Neurological: Alert and oriented to person, place, and time. Exhibits normal muscle tone. Gait normal. Coordination normal.  Skin: Skin is warm and dry. No  rash noted. Not diaphoretic. No erythema. No pallor.  Psychiatric: Mood, memory and judgment normal.  Vitals reviewed.  LABORATORY DATA: Lab Results  Component Value Date   WBC 6.0 02/12/2020   HGB 12.2 (L) 02/12/2020   HCT 38.0 (L) 02/12/2020   MCV 91.8 02/12/2020   PLT 173 02/12/2020      Chemistry      Component Value Date/Time   NA 137 02/12/2020 1012   K 4.4 02/12/2020 1012   CL 102 02/12/2020 1012   CO2 27 02/12/2020 1012   BUN 9 02/12/2020 1012   CREATININE 0.75 02/12/2020 1012      Component Value Date/Time   CALCIUM 8.8 (L) 02/12/2020 1012   ALKPHOS 84 02/12/2020 1012   AST 23 02/12/2020 1012   ALT 41 02/12/2020 1012   BILITOT 0.3 02/12/2020 1012       RADIOGRAPHIC STUDIES:  DG Chest 2 View  Result Date: 01/20/2020 CLINICAL DATA:  Short of breath.  History of lung carcinoma. EXAM: CHEST - 2 VIEW COMPARISON:  12/12/2019 FINDINGS: Cardiac silhouette is normal in size. No mediastinal masses. There is opacity that extends lateral from the right hilum, smaller and less masslike than on the prior exam, less evident on the lateral. No left hilar mass. Lungs otherwise clear.  No pleural effusion or pneumothorax. Left anterior chest wall Port-A-Cath is well positioned, new since the prior exam. There is resorption of the lateral aspect of the right eighth rib new or more apparent than it was on the prior exam. IMPRESSION: 1. The central right upper lobe mass noted on prior imaging has decreased in size consistent with a positive response to treatment. 2. There is a lytic lesion of the right lateral eighth rib, not clearly evident on the prior radiograph, but definitively seen on the CT from 12/12/2019. This appears to have increased in size. 3. Remainder of the lungs is clear. No evidence of pneumonia or pulmonary edema. No acute findings. Electronically Signed   By: Lajean Manes M.D.   On: 01/20/2020 05:54   CT Angio Chest PE W and/or Wo Contrast  Result Date:  01/20/2020 CLINICAL DATA:  Severe shortness of breath.  Cancer patient. EXAM: CT ANGIOGRAPHY CHEST WITH CONTRAST TECHNIQUE: Multidetector CT imaging of the chest was performed using the standard protocol during bolus administration of intravenous contrast. Multiplanar CT image reconstructions and MIPs were obtained to evaluate the vascular anatomy. CONTRAST:  104mL OMNIPAQUE IOHEXOL 350 MG/ML SOLN COMPARISON:  12/12/2019 FINDINGS: Cardiovascular: Extensive acute branching pulmonary emboli extending from the pulmonary artery outflow tract and saddling the main pulmonary artery. There is right heart strain with RV to LV ratio of 1.5. No pericardial effusion or generalized cardiac enlargement. No acute aortic finding Mediastinum/Nodes: Right paratracheal adenopathy Lungs/Pleura: Known right upper lobe malignancy measuring 3.7 cm on coronal reformats. No lung infarct or failure. Upper Abdomen:  Hepatic venous reflux from elevated right heart pressure. Musculoskeletal: Known osseous metastatic disease with lytic lesions in the sternum and lateral right eighth rib. Review of the MIP images confirms the above findings. Critical Value/emergent results were called by telephone at the time of interpretation on 01/20/2020 at 7:54 am to provider Dr Maryan Rued , who verbally acknowledged these results. IMPRESSION: 1. Acute central pulmonary emboli with clot beginning at the pulmonary artery outflow tract and saddling the pulmonary artery bifurcation. Right heart strain. The presence of right heart strain has been associated with an increased risk of morbidity and mortality. 2. Known bronchogenic carcinoma with nodal and bony metastases. Electronically Signed   By: Monte Fantasia M.D.   On: 01/20/2020 07:57   ECHOCARDIOGRAM COMPLETE  Result Date: 01/20/2020    ECHOCARDIOGRAM REPORT   Patient Name:   JAKE FUHRMANN [facility] Date of Exam: 01/20/2020 Medical Rec #:  [id number]       Height:       71.0 in Accession #:    [id number]      Weight:        269.0 lb Date of Birth:  April 18, 1966       BSA:          2.392 m Patient Age:    42 years        BP:           132/91 mmHg Patient Gender: M               HR:           122 bpm. Exam Location:  Inpatient Procedure: 2D Echo, Cardiac Doppler and Color Doppler Indications:    Dyspnea 786.09/ / R06.00  History:        Patient has no prior history of Echocardiogram examinations.                 Signs/Symptoms:Murmur; Risk Factors:Diabetes, Sleep Apnea and                 Dyslipidemia. Cancer.  Sonographer:    Jonelle Sidle Dance Referring Phys: [place]  1. Normal LV function; mildly dilated aortic root; mild RVE with severe RV dysfunction.  2. Left ventricular ejection fraction, by estimation, is 55 to 60%. The left ventricle has normal function. The left ventricle has no regional wall motion abnormalities. Left ventricular diastolic parameters were normal.  3. Right ventricular systolic function is severely reduced. The right ventricular size is mildly enlarged.  4. The mitral valve is normal in structure. No evidence of mitral valve regurgitation. No evidence of mitral stenosis.  5. The aortic valve is tricuspid. Aortic valve regurgitation is not visualized. Mild aortic valve sclerosis is present, with no evidence of aortic valve stenosis.  6. Aortic dilatation noted. There is mild dilatation of the aortic root measuring 40 mm.  7. The inferior vena cava is dilated in size with <50% respiratory variability, suggesting right atrial pressure of 15 mmHg. FINDINGS  Left Ventricle: Left ventricular ejection fraction, by estimation, is 55 to 60%. The left ventricle has normal function. The left ventricle has no regional wall motion abnormalities. The left ventricular internal cavity size was normal in size. There is  no left ventricular hypertrophy. Left ventricular diastolic parameters were normal. Right Ventricle: The right ventricular size is mildly enlarged.Right ventricular systolic function is  severely reduced. Left Atrium: Left atrial size was normal in size. Right Atrium: Right atrial size was normal in size. Pericardium: Trivial pericardial effusion is  present. Mitral Valve: The mitral valve is normal in structure. Normal mobility of the mitral valve leaflets. No evidence of mitral valve regurgitation. No evidence of mitral valve stenosis. Tricuspid Valve: The tricuspid valve is normal in structure. Tricuspid valve regurgitation is trivial. No evidence of tricuspid stenosis. Aortic Valve: The aortic valve is tricuspid. Aortic valve regurgitation is not visualized. Mild aortic valve sclerosis is present, with no evidence of aortic valve stenosis. Pulmonic Valve: The pulmonic valve was not well visualized. Pulmonic valve regurgitation is not visualized. No evidence of pulmonic stenosis. Aorta: Aortic dilatation noted. There is mild dilatation of the aortic root measuring 40 mm. Venous: The inferior vena cava is dilated in size with less than 50% respiratory variability, suggesting right atrial pressure of 15 mmHg.  Additional Comments: Normal LV function; mildly dilated aortic root; mild RVE with severe RV dysfunction.  LEFT VENTRICLE PLAX 2D LVIDd:         4.40 cm LVIDs:         3.20 cm LV PW:         1.30 cm LV IVS:        1.00 cm LVOT diam:     2.20 cm LV SV:         57 LV SV Index:   24 LVOT Area:     3.80 cm  RIGHT VENTRICLE            IVC RV Basal diam:  3.10 cm    IVC diam: 2.25 cm RV Mid diam:    2.80 cm RV S prime:     9.25 cm/s TAPSE (M-mode): 1.7 cm LEFT ATRIUM             Index       RIGHT ATRIUM           Index LA diam:        3.60 cm 1.51 cm/m  RA Area:     18.90 cm LA Vol (A2C):   39.4 ml 16.47 ml/m RA Volume:   58.60 ml  24.50 ml/m LA Vol (A4C):   48.5 ml 20.28 ml/m LA Biplane Vol: 45.8 ml 19.15 ml/m  AORTIC VALVE LVOT Vmax:   106.50 cm/s LVOT Vmean:  58.150 cm/s LVOT VTI:    0.151 m  AORTA Ao Root diam: 4.00 cm Ao Asc diam:  3.60 cm MITRAL VALVE MV Area (PHT): 5.32 cm     SHUNTS  MV Decel Time: 143 msec     Systemic VTI:  0.15 m MV E velocity: 105.00 cm/s  Systemic Diam: 2.20 cm Kirk Ruths MD Electronically signed by Kirk Ruths MD Signature Date/Time: 01/20/2020/11:58:59 AM    Final    VAS [place] LOWER EXTREMITY VENOUS (DVT)  Result Date: 01/21/2020  Lower Venous DVTStudy Indications: Pulmonary embolism.  Risk Factors: Cancer. Comparison Study: No prior studies. Performing Technologist: Oliver Hum RVT  Examination Guidelines: A complete evaluation includes B-mode imaging, spectral Doppler, color Doppler, and power Doppler as needed of all accessible portions of each vessel. Bilateral testing is considered an integral part of a complete examination. Limited examinations for reoccurring indications may be performed as noted. The reflux portion of the exam is performed with the patient in reverse Trendelenburg.  +---------+---------------+---------+-----------+----------+--------------+ RIGHT    CompressibilityPhasicitySpontaneityPropertiesThrombus Aging +---------+---------------+---------+-----------+----------+--------------+ CFV      Full           Yes      Yes                                 +---------+---------------+---------+-----------+----------+--------------+  SFJ      Full                                                        +---------+---------------+---------+-----------+----------+--------------+ FV Prox  Full                                                        +---------+---------------+---------+-----------+----------+--------------+ FV Mid   Full                                                        +---------+---------------+---------+-----------+----------+--------------+ FV DistalFull                                                        +---------+---------------+---------+-----------+----------+--------------+ PFV      Full                                                         +---------+---------------+---------+-----------+----------+--------------+ POP      Full           Yes      Yes                                 +---------+---------------+---------+-----------+----------+--------------+ PTV      Full                                                        +---------+---------------+---------+-----------+----------+--------------+ PERO     Full                                                        +---------+---------------+---------+-----------+----------+--------------+   +---------+---------------+---------+-----------+----------+--------------+ LEFT     CompressibilityPhasicitySpontaneityPropertiesThrombus Aging +---------+---------------+---------+-----------+----------+--------------+ CFV      Full           Yes      Yes                                 +---------+---------------+---------+-----------+----------+--------------+ SFJ      Full                                                        +---------+---------------+---------+-----------+----------+--------------+  FV Prox  Full                                                        +---------+---------------+---------+-----------+----------+--------------+ FV Mid   Full                                                        +---------+---------------+---------+-----------+----------+--------------+ FV DistalFull                                                        +---------+---------------+---------+-----------+----------+--------------+ PFV      Full                                                        +---------+---------------+---------+-----------+----------+--------------+ POP      Full           Yes      Yes                                 +---------+---------------+---------+-----------+----------+--------------+ PTV      Full                                                         +---------+---------------+---------+-----------+----------+--------------+ PERO     Full                                                        +---------+---------------+---------+-----------+----------+--------------+     Summary: RIGHT: - There is no evidence of deep vein thrombosis in the lower extremity.  - No cystic structure found in the popliteal fossa.  LEFT: - There is no evidence of deep vein thrombosis in the lower extremity.  - No cystic structure found in the popliteal fossa.  *See table(s) above for measurements and observations. Electronically signed by Monica Martinez MD on 01/21/2020 at 5:31:28 PM.    Final      ASSESSMENT/PLAN:  This is a very pleasant 54 year old Caucasian  male recently diagnosed with a stage IV (T2b, N3, M1C) non-small cell lung cancer, squamous cell carcinoma presented with right upper lobe lung mass in addition to mediastinal and right supraclavicular lymphadenopathy as well as multiple metastatic bone lesions diagnosed in March 2021.  The patient completed palliative radiotherapy to the painful bone lesions under the care of Dr. Sondra Come. Last treatment on 01/14/20.   The patient is currently undergoing palliative systemic chemotherapy with carboplatin for an AUC of 5,  paclitaxel 175 mg/m, and Keytruda 200 mg IV every 3 weeks with Neulasta support. He is status post 1 cycle. He tolerated it well except except for mildly sore gums.   Labs were reviewed. Recommend that he receive cycle #2 today as scheduled. His blood sugar was elevated today at 300. He will receive 7 units of insulin while in the clinic. He was advised to monitor his blood sugar closely at home and take his medications as directed.    We will see him back for a follow up visit in 3 weeks for evaluation before staring cycle #3.   He will continue taking eliquis for his recent PE. He was advised to call us when it is time for a refill so the maintenance dose can be prescribed.   I will  send a refill for his pain medication for his cancer related pain secondary to his metastatic bone lesions. I am going to refill percocet instead of the 10 mg of oxycodone. He understand that this is only to be taken if needed.     The patient was advised to call immediately if he has any concerning symptoms in the interval. The patient voices understanding of current disease status and treatment options and is in agreement with the current care plan. All questions were answered. The patient knows to call the clinic with any problems, questions or concerns. We can certainly see the patient much sooner if necessary  No orders of the defined types were placed in this encounter.    Permelia Bamba L Kaleab Frasier, PA-C 02/12/20

## 2020-02-12 ENCOUNTER — Inpatient Hospital Stay: Payer: 59

## 2020-02-12 ENCOUNTER — Inpatient Hospital Stay (HOSPITAL_BASED_OUTPATIENT_CLINIC_OR_DEPARTMENT_OTHER): Payer: 59 | Admitting: Physician Assistant

## 2020-02-12 ENCOUNTER — Other Ambulatory Visit: Payer: Self-pay | Admitting: Radiation Oncology

## 2020-02-12 ENCOUNTER — Other Ambulatory Visit: Payer: Self-pay | Admitting: Internal Medicine

## 2020-02-12 ENCOUNTER — Other Ambulatory Visit: Payer: Self-pay

## 2020-02-12 VITALS — BP 137/82 | HR 102 | Temp 98.2°F | Resp 18 | Ht 71.0 in | Wt 262.5 lb

## 2020-02-12 DIAGNOSIS — E1169 Type 2 diabetes mellitus with other specified complication: Secondary | ICD-10-CM

## 2020-02-12 DIAGNOSIS — Z95828 Presence of other vascular implants and grafts: Secondary | ICD-10-CM

## 2020-02-12 DIAGNOSIS — C3491 Malignant neoplasm of unspecified part of right bronchus or lung: Secondary | ICD-10-CM

## 2020-02-12 DIAGNOSIS — Z5112 Encounter for antineoplastic immunotherapy: Secondary | ICD-10-CM

## 2020-02-12 DIAGNOSIS — Z5111 Encounter for antineoplastic chemotherapy: Secondary | ICD-10-CM | POA: Diagnosis not present

## 2020-02-12 DIAGNOSIS — C7951 Secondary malignant neoplasm of bone: Secondary | ICD-10-CM

## 2020-02-12 LAB — CMP (CANCER CENTER ONLY)
ALT: 41 U/L (ref 0–44)
AST: 23 U/L (ref 15–41)
Albumin: 3.3 g/dL — ABNORMAL LOW (ref 3.5–5.0)
Alkaline Phosphatase: 84 U/L (ref 38–126)
Anion gap: 8 (ref 5–15)
BUN: 9 mg/dL (ref 6–20)
CO2: 27 mmol/L (ref 22–32)
Calcium: 8.8 mg/dL — ABNORMAL LOW (ref 8.9–10.3)
Chloride: 102 mmol/L (ref 98–111)
Creatinine: 0.75 mg/dL (ref 0.61–1.24)
GFR, Est AFR Am: >60 mL/min (ref >60)
GFR, Estimated: >60 mL/min (ref >60)
Glucose, Bld: 300 mg/dL — ABNORMAL HIGH (ref 70–99)
Potassium: 4.4 mmol/L (ref 3.5–5.1)
Sodium: 137 mmol/L (ref 135–145)
Total Bilirubin: 0.3 mg/dL (ref 0.3–1.2)
Total Protein: 6.8 g/dL (ref 6.5–8.1)

## 2020-02-12 LAB — CBC WITH DIFFERENTIAL (CANCER CENTER ONLY)
Abs Immature Granulocytes: 0.02 10*3/uL (ref 0.00–0.07)
Basophils Absolute: 0 10*3/uL (ref 0.0–0.1)
Basophils Relative: 1 %
Eosinophils Absolute: 0.1 10*3/uL (ref 0.0–0.5)
Eosinophils Relative: 1 %
HCT: 38 % — ABNORMAL LOW (ref 39.0–52.0)
Hemoglobin: 12.2 g/dL — ABNORMAL LOW (ref 13.0–17.0)
Immature Granulocytes: 0 %
Lymphocytes Relative: 20 %
Lymphs Abs: 1.2 10*3/uL (ref 0.7–4.0)
MCH: 29.5 pg (ref 26.0–34.0)
MCHC: 32.1 g/dL (ref 30.0–36.0)
MCV: 91.8 fL (ref 80.0–100.0)
Monocytes Absolute: 0.7 10*3/uL (ref 0.1–1.0)
Monocytes Relative: 12 %
Neutro Abs: 3.9 10*3/uL (ref 1.7–7.7)
Neutrophils Relative %: 66 %
Platelet Count: 173 10*3/uL (ref 150–400)
RBC: 4.14 MIL/uL — ABNORMAL LOW (ref 4.22–5.81)
RDW: 15 % (ref 11.5–15.5)
WBC Count: 6 10*3/uL (ref 4.0–10.5)
nRBC: 0 % (ref 0.0–0.2)

## 2020-02-12 LAB — TSH: TSH: <0.08 u[IU]/mL — ABNORMAL LOW (ref 0.320–4.118)

## 2020-02-12 MED ORDER — SODIUM CHLORIDE 0.9 % IV SOLN
150.0000 mg | Freq: Once | INTRAVENOUS | Status: AC
  Administered 2020-02-12: 13:00:00 150 mg via INTRAVENOUS
  Filled 2020-02-12: qty 150

## 2020-02-12 MED ORDER — DIPHENHYDRAMINE HCL 50 MG/ML IJ SOLN
INTRAMUSCULAR | Status: AC
  Filled 2020-02-12: qty 1

## 2020-02-12 MED ORDER — HEPARIN SOD (PORK) LOCK FLUSH 100 UNIT/ML IV SOLN
500.0000 [IU] | Freq: Once | INTRAVENOUS | Status: AC | PRN
  Administered 2020-02-12: 500 [IU]
  Filled 2020-02-12: qty 5

## 2020-02-12 MED ORDER — SODIUM CHLORIDE 0.9 % IV SOLN
200.0000 mg | Freq: Once | INTRAVENOUS | Status: AC
  Administered 2020-02-12: 200 mg via INTRAVENOUS
  Filled 2020-02-12: qty 8

## 2020-02-12 MED ORDER — FAMOTIDINE IN NACL 20-0.9 MG/50ML-% IV SOLN
20.0000 mg | Freq: Once | INTRAVENOUS | Status: AC
  Administered 2020-02-12: 12:00:00 20 mg via INTRAVENOUS

## 2020-02-12 MED ORDER — PALONOSETRON HCL INJECTION 0.25 MG/5ML
0.2500 mg | Freq: Once | INTRAVENOUS | Status: AC
  Administered 2020-02-12: 12:00:00 0.25 mg via INTRAVENOUS

## 2020-02-12 MED ORDER — DIPHENHYDRAMINE HCL 50 MG/ML IJ SOLN
50.0000 mg | Freq: Once | INTRAMUSCULAR | Status: AC
  Administered 2020-02-12: 50 mg via INTRAVENOUS

## 2020-02-12 MED ORDER — OXYCODONE-ACETAMINOPHEN 5-325 MG PO TABS: 1.0000 | ORAL_TABLET | Freq: Four times a day (QID) | ORAL | 0 refills | Status: DC | PRN

## 2020-02-12 MED ORDER — SODIUM CHLORIDE 0.9% FLUSH
10.0000 mL | INTRAVENOUS | Status: DC | PRN
  Administered 2020-02-12: 18:00:00 10 mL
  Filled 2020-02-12: qty 10

## 2020-02-12 MED ORDER — SODIUM CHLORIDE 0.9 % IV SOLN
750.0000 mg | Freq: Once | INTRAVENOUS | Status: AC
  Administered 2020-02-12: 17:00:00 750 mg via INTRAVENOUS
  Filled 2020-02-12: qty 75

## 2020-02-12 MED ORDER — INSULIN REGULAR HUMAN 100 UNIT/ML IJ SOLN
7.0000 [IU] | Freq: Once | INTRAMUSCULAR | Status: AC
  Administered 2020-02-12: 12:00:00 7 [IU] via SUBCUTANEOUS

## 2020-02-12 MED ORDER — SODIUM CHLORIDE 0.9 % IV SOLN
Freq: Once | INTRAVENOUS | Status: AC
  Filled 2020-02-12: qty 250

## 2020-02-12 MED ORDER — SODIUM CHLORIDE 0.9% FLUSH
10.0000 mL | Freq: Once | INTRAVENOUS | Status: AC
  Administered 2020-02-12: 10:00:00 10 mL
  Filled 2020-02-12: qty 10

## 2020-02-12 MED ORDER — INSULIN REGULAR HUMAN 100 UNIT/ML IJ SOLN
INTRAMUSCULAR | Status: AC
  Filled 2020-02-12: qty 1

## 2020-02-12 MED ORDER — PALONOSETRON HCL INJECTION 0.25 MG/5ML
INTRAVENOUS | Status: AC
  Filled 2020-02-12: qty 5

## 2020-02-12 MED ORDER — FAMOTIDINE IN NACL 20-0.9 MG/50ML-% IV SOLN
INTRAVENOUS | Status: AC
  Filled 2020-02-12: qty 50

## 2020-02-12 MED ORDER — SODIUM CHLORIDE 0.9 % IV SOLN
175.0000 mg/m2 | Freq: Once | INTRAVENOUS | Status: AC
  Administered 2020-02-12: 14:00:00 438 mg via INTRAVENOUS
  Filled 2020-02-12: qty 73

## 2020-02-12 MED ORDER — SODIUM CHLORIDE 0.9 % IV SOLN
10.0000 mg | Freq: Once | INTRAVENOUS | Status: AC
  Administered 2020-02-12: 10 mg via INTRAVENOUS
  Filled 2020-02-12: qty 10

## 2020-02-12 NOTE — Progress Notes (Signed)
Per Cassie [name] PA-C, ok to treat with elevated HR. Per Patty Sermons RN, repeat glucometer 169 mg/dL.Cassie [name] notified. No new orders received.

## 2020-02-12 NOTE — Patient Instructions (Signed)
[facility] Discharge Instructions for Patients Receiving Chemotherapy  Today you received the following chemotherapy agents: Pembrolizumab (Keytruda), Paclitaxel (Taxol), and Carboplatin (Paraplatin)  To help prevent nausea and vomiting after your treatment, we encourage you to take your nausea medication as prescribed.   If you develop nausea and vomiting that is not controlled by your nausea medication, call the clinic.   BELOW ARE SYMPTOMS THAT SHOULD BE REPORTED IMMEDIATELY:  *FEVER GREATER THAN 100.5 F  *CHILLS WITH OR WITHOUT FEVER  NAUSEA AND VOMITING THAT IS NOT CONTROLLED WITH YOUR NAUSEA MEDICATION  *UNUSUAL SHORTNESS OF BREATH  *UNUSUAL BRUISING OR BLEEDING  TENDERNESS IN MOUTH AND THROAT WITH OR WITHOUT PRESENCE OF ULCERS  *URINARY PROBLEMS  *BOWEL PROBLEMS  UNUSUAL RASH Items with * indicate a potential emergency and should be followed up as soon as possible.  Feel free to call the clinic should you have any questions or concerns. The clinic phone number is [phone].  Please show the [facility] at check-in to the Emergency Department and triage nurse.

## 2020-02-12 NOTE — Progress Notes (Signed)
Mike [name] presents today for 1 month follow up after completing radiation to the RIGHT lung, rib and hip on 01/14/2020.  Pain greatly improved overall. Reports rare pain in right ribs and sternum. Reports right hip pain slowly improving but still requires pain medication just not as often. Fatigue after chemo Cough productive with clear phlegm no hemoptysis SOB Only with great exertion Diff. Swallowing? Denies N/V/D? Denies Skin: Denies Chemo/immunotherapy: Received chemo infusion on Tuesday. Patient reports he felt slightly weak and a little shaky the next morning.   BP and heart slightly elevated.   Walking now and doesn't require wheelchair as he did before radiation. Denies bowel or bladder incontinence. Reports nocturia x 3-4.

## 2020-02-13 ENCOUNTER — Telehealth: Payer: Self-pay | Admitting: Internal Medicine

## 2020-02-13 LAB — GLUCOSE, CAPILLARY: Glucose-Capillary: 169 mg/dL — ABNORMAL HIGH (ref 70–99)

## 2020-02-13 NOTE — Progress Notes (Signed)
Radiation Oncology         [phone] ________________________________  Name: Mike [name] MRN: [id number]  Date: 02/14/2020  DOB: 04-30-1966  Follow-Up Visit Note  CC: [name], MD  [name], MD    ICD-10-CM   1. Secondary malignant neoplasm of bone and bone marrow (HCC)  C79.51    C79.52   2. Stage IV squamous cell carcinoma of right lung (HCC)  C34.91     Diagnosis: Stage IV (T2b, N3, M1C) non-small cell lung cancer, squamous cell carcinoma presented with right upper lobe lung mass in addition to mediastinal and right supraclavicular lymphadenopathy as well as multiple metastatic bone lesions  Interval Since Last Radiation: One month  Radiation Treatment Dates: 01/01/2020 through 01/14/2020 Site Technique Total Dose (Gy) Dose per Fx (Gy) Completed Fx Beam Energies  Hip, Right: Pelvis_Rt 3D 30/30 3 10/10 10X, 15X  Ribs, Right: Chest_Rt 3D 30/30 3 10/10 10X  Chest: Chest 3D 30/30 3 10/10 6X    Narrative:  The patient returns today for routine follow-up. Since the end of treatment, the patient was seen in the ED on 01/20/2020 with chief complaint of shortness of breath. Chest x-ray at that time showed a decrease in size of the central right upper lobe mass that was consistent with a positive response to treatment. There was also a lytic lesion of the right lateral eighth rib that appeared to have increased in size. CTA of chest at that time showed an acute central pulmonary emboli with clot beginning at the pulmonary artery outflow tract and saddling the pulmonary artery bifurcation. There was also some right heart strain and the known bronchogenic carcinoma with nodal and bony metastases. The patient was admitted to the hospital for further management and was discharged on 01/23/2020.  The patient was seen by [facility], PA-C, on 02/12/2020. He is status post two cycles of palliative systemic chemotherapy with Carboplatin, Paclitaxel, and Keytruda with  Neulasta support started on 01/03/2020.  On review of systems, he reports rare pain in right ribs and sternum and improving right hip pain. He also reports fatigue attributed to chemotherapy, shortness of breath only with significant exertion, and productive cough with clear phlegm. He denies difficulty swallowing, nausea, vomiting, diarrhea, and skin changes.  ALLERGIES:  has No Known Allergies.  Meds: Current Outpatient Medications  Medication Sig Dispense Refill  . albuterol (PROVENTIL) (2.5 MG/3ML) 0.083% nebulizer solution Take 3 mLs (2.5 mg total) by nebulization every 6 (six) hours as needed for wheezing or shortness of breath. 75 mL 12  . apixaban (ELIQUIS) 5 MG TABS tablet Take 2 tabs (10mg ) twice daily for 7 days. On 01/29/20, change to 1 tab (5mg ) twice a day. 60 tablet 3  . atorvastatin (LIPITOR) 20 MG tablet Take 1 tablet (20 mg total) by mouth daily. (Patient taking differently: Take 10 mg by mouth in the morning and at bedtime. ) 90 tablet 3  . lidocaine-prilocaine (EMLA) cream Apply to the Port-A-Cath site 30 minutes before treatment (Patient taking differently: Apply 1 application topically as needed (port access). Apply to the Port-A-Cath site 30 minutes before treatment) 30 g 0  . metFORMIN (GLUCOPHAGE) 1000 MG tablet Take 0.5 tablets (500 mg total) by mouth 2 (two) times daily with a meal. 180 tablet 3  . oxyCODONE-acetaminophen (PERCOCET/ROXICET) 5-325 MG tablet Take 1 tablet by mouth every 6 (six) hours as needed for severe pain. 30 tablet 0  . diazepam (VALIUM) 5 MG tablet TAKE 1 TABLET (5 MG TOTAL) BY  MOUTH DAILY AS NEEDED FOR MUSCLE SPASMS. (Patient not taking: Reported on 02/14/2020) 30 tablet 0  . omeprazole (PRILOSEC) 40 MG capsule Take 1 capsule (40 mg total) by mouth daily. For 3 weeks then as needed (Patient not taking: Reported on 02/14/2020) 30 capsule 3  . sildenafil (REVATIO) 20 MG tablet Take 20-100 mg by mouth daily as needed (ED).      No current  facility-administered medications for this encounter.    Physical Findings: The patient is in no acute distress. Patient is alert and oriented.  vitals were not taken for this visit.   No significant changes. Lungs are clear to auscultation bilaterally. Heart has regular rate and rhythm. No palpable cervical, supraclavicular, or axillary adenopathy. Abdomen soft, non-tender, normal bowel sounds.   Lab Findings: Lab Results  Component Value Date   WBC 6.0 02/12/2020   HGB 12.2 (L) 02/12/2020   HCT 38.0 (L) 02/12/2020   MCV 91.8 02/12/2020   PLT 173 02/12/2020    Radiographic Findings: DG Chest 2 View  Result Date: 01/20/2020 CLINICAL DATA:  Short of breath.  History of lung carcinoma. EXAM: CHEST - 2 VIEW COMPARISON:  12/12/2019 FINDINGS: Cardiac silhouette is normal in size. No mediastinal masses. There is opacity that extends lateral from the right hilum, smaller and less masslike than on the prior exam, less evident on the lateral. No left hilar mass. Lungs otherwise clear.  No pleural effusion or pneumothorax. Left anterior chest wall Port-A-Cath is well positioned, new since the prior exam. There is resorption of the lateral aspect of the right eighth rib new or more apparent than it was on the prior exam. IMPRESSION: 1. The central right upper lobe mass noted on prior imaging has decreased in size consistent with a positive response to treatment. 2. There is a lytic lesion of the right lateral eighth rib, not clearly evident on the prior radiograph, but definitively seen on the CT from 12/12/2019. This appears to have increased in size. 3. Remainder of the lungs is clear. No evidence of pneumonia or pulmonary edema. No acute findings. Electronically Signed   By: Lajean Manes M.D.   On: 01/20/2020 05:54   CT Angio Chest PE W and/or Wo Contrast  Result Date: 01/20/2020 CLINICAL DATA:  Severe shortness of breath.  Cancer patient. EXAM: CT ANGIOGRAPHY CHEST WITH CONTRAST TECHNIQUE:  Multidetector CT imaging of the chest was performed using the standard protocol during bolus administration of intravenous contrast. Multiplanar CT image reconstructions and MIPs were obtained to evaluate the vascular anatomy. CONTRAST:  43mL OMNIPAQUE IOHEXOL 350 MG/ML SOLN COMPARISON:  12/12/2019 FINDINGS: Cardiovascular: Extensive acute branching pulmonary emboli extending from the pulmonary artery outflow tract and saddling the main pulmonary artery. There is right heart strain with RV to LV ratio of 1.5. No pericardial effusion or generalized cardiac enlargement. No acute aortic finding Mediastinum/Nodes: Right paratracheal adenopathy Lungs/Pleura: Known right upper lobe malignancy measuring 3.7 cm on coronal reformats. No lung infarct or failure. Upper Abdomen: Hepatic venous reflux from elevated right heart pressure. Musculoskeletal: Known osseous metastatic disease with lytic lesions in the sternum and lateral right eighth rib. Review of the MIP images confirms the above findings. Critical Value/emergent results were called by telephone at the time of interpretation on 01/20/2020 at 7:54 am to provider Dr Maryan Rued , who verbally acknowledged these results. IMPRESSION: 1. Acute central pulmonary emboli with clot beginning at the pulmonary artery outflow tract and saddling the pulmonary artery bifurcation. Right heart strain. The presence of right heart  strain has been associated with an increased risk of morbidity and mortality. 2. Known bronchogenic carcinoma with nodal and bony metastases. Electronically Signed   By: Monte Fantasia M.D.   On: 01/20/2020 07:57   ECHOCARDIOGRAM COMPLETE  Result Date: 01/20/2020    ECHOCARDIOGRAM REPORT   Patient Name:   Mike [name] [facility] Date of Exam: 01/20/2020 Medical Rec #:  [id number]       Height:       71.0 in Accession #:    [id number]      Weight:       269.0 lb Date of Birth:  1966/07/18       BSA:          2.392 m Patient Age:    54 years        BP:           132/91  mmHg Patient Gender: M               HR:           122 bpm. Exam Location:  Inpatient Procedure: 2D Echo, Cardiac Doppler and Color Doppler Indications:    Dyspnea 786.09/ / R06.00  History:        Patient has no prior history of Echocardiogram examinations.                 Signs/Symptoms:Murmur; Risk Factors:Diabetes, Sleep Apnea and                 Dyslipidemia. Cancer.  Sonographer:    Jonelle Sidle Dance Referring Phys: [place]  1. Normal LV function; mildly dilated aortic root; mild RVE with severe RV dysfunction.  2. Left ventricular ejection fraction, by estimation, is 55 to 60%. The left ventricle has normal function. The left ventricle has no regional wall motion abnormalities. Left ventricular diastolic parameters were normal.  3. Right ventricular systolic function is severely reduced. The right ventricular size is mildly enlarged.  4. The mitral valve is normal in structure. No evidence of mitral valve regurgitation. No evidence of mitral stenosis.  5. The aortic valve is tricuspid. Aortic valve regurgitation is not visualized. Mild aortic valve sclerosis is present, with no evidence of aortic valve stenosis.  6. Aortic dilatation noted. There is mild dilatation of the aortic root measuring 40 mm.  7. The inferior vena cava is dilated in size with <50% respiratory variability, suggesting right atrial pressure of 15 mmHg. FINDINGS  Left Ventricle: Left ventricular ejection fraction, by estimation, is 55 to 60%. The left ventricle has normal function. The left ventricle has no regional wall motion abnormalities. The left ventricular internal cavity size was normal in size. There is  no left ventricular hypertrophy. Left ventricular diastolic parameters were normal. Right Ventricle: The right ventricular size is mildly enlarged.Right ventricular systolic function is severely reduced. Left Atrium: Left atrial size was normal in size. Right Atrium: Right atrial size was normal in size.  Pericardium: Trivial pericardial effusion is present. Mitral Valve: The mitral valve is normal in structure. Normal mobility of the mitral valve leaflets. No evidence of mitral valve regurgitation. No evidence of mitral valve stenosis. Tricuspid Valve: The tricuspid valve is normal in structure. Tricuspid valve regurgitation is trivial. No evidence of tricuspid stenosis. Aortic Valve: The aortic valve is tricuspid. Aortic valve regurgitation is not visualized. Mild aortic valve sclerosis is present, with no evidence of aortic valve stenosis. Pulmonic Valve: The pulmonic valve was not well visualized. Pulmonic valve regurgitation is not visualized.  No evidence of pulmonic stenosis. Aorta: Aortic dilatation noted. There is mild dilatation of the aortic root measuring 40 mm. Venous: The inferior vena cava is dilated in size with less than 50% respiratory variability, suggesting right atrial pressure of 15 mmHg.  Additional Comments: Normal LV function; mildly dilated aortic root; mild RVE with severe RV dysfunction.  LEFT VENTRICLE PLAX 2D LVIDd:         4.40 cm LVIDs:         3.20 cm LV PW:         1.30 cm LV IVS:        1.00 cm LVOT diam:     2.20 cm LV SV:         57 LV SV Index:   24 LVOT Area:     3.80 cm  RIGHT VENTRICLE            IVC RV Basal diam:  3.10 cm    IVC diam: 2.25 cm RV Mid diam:    2.80 cm RV S prime:     9.25 cm/s TAPSE (M-mode): 1.7 cm LEFT ATRIUM             Index       RIGHT ATRIUM           Index LA diam:        3.60 cm 1.51 cm/m  RA Area:     18.90 cm LA Vol (A2C):   39.4 ml 16.47 ml/m RA Volume:   58.60 ml  24.50 ml/m LA Vol (A4C):   48.5 ml 20.28 ml/m LA Biplane Vol: 45.8 ml 19.15 ml/m  AORTIC VALVE LVOT Vmax:   106.50 cm/s LVOT Vmean:  58.150 cm/s LVOT VTI:    0.151 m  AORTA Ao Root diam: 4.00 cm Ao Asc diam:  3.60 cm MITRAL VALVE MV Area (PHT): 5.32 cm     SHUNTS MV Decel Time: 143 msec     Systemic VTI:  0.15 m MV E velocity: 105.00 cm/s  Systemic Diam: 2.20 cm Kirk Ruths MD  Electronically signed by Kirk Ruths MD Signature Date/Time: 01/20/2020/11:58:59 AM    Final    VAS [place] LOWER EXTREMITY VENOUS (DVT)  Result Date: 01/21/2020  Lower Venous DVTStudy Indications: Pulmonary embolism.  Risk Factors: Cancer. Comparison Study: No prior studies. Performing Technologist: Oliver Hum RVT  Examination Guidelines: A complete evaluation includes B-mode imaging, spectral Doppler, color Doppler, and power Doppler as needed of all accessible portions of each vessel. Bilateral testing is considered an integral part of a complete examination. Limited examinations for reoccurring indications may be performed as noted. The reflux portion of the exam is performed with the patient in reverse Trendelenburg.  +---------+---------------+---------+-----------+----------+--------------+ RIGHT    CompressibilityPhasicitySpontaneityPropertiesThrombus Aging +---------+---------------+---------+-----------+----------+--------------+ CFV      Full           Yes      Yes                                 +---------+---------------+---------+-----------+----------+--------------+ SFJ      Full                                                        +---------+---------------+---------+-----------+----------+--------------+ FV Prox  Full                                                        +---------+---------------+---------+-----------+----------+--------------+  FV Mid   Full                                                        +---------+---------------+---------+-----------+----------+--------------+ FV DistalFull                                                        +---------+---------------+---------+-----------+----------+--------------+ PFV      Full                                                        +---------+---------------+---------+-----------+----------+--------------+ POP      Full           Yes      Yes                                  +---------+---------------+---------+-----------+----------+--------------+ PTV      Full                                                        +---------+---------------+---------+-----------+----------+--------------+ PERO     Full                                                        +---------+---------------+---------+-----------+----------+--------------+   +---------+---------------+---------+-----------+----------+--------------+ LEFT     CompressibilityPhasicitySpontaneityPropertiesThrombus Aging +---------+---------------+---------+-----------+----------+--------------+ CFV      Full           Yes      Yes                                 +---------+---------------+---------+-----------+----------+--------------+ SFJ      Full                                                        +---------+---------------+---------+-----------+----------+--------------+ FV Prox  Full                                                        +---------+---------------+---------+-----------+----------+--------------+ FV Mid   Full                                                        +---------+---------------+---------+-----------+----------+--------------+  FV DistalFull                                                        +---------+---------------+---------+-----------+----------+--------------+ PFV      Full                                                        +---------+---------------+---------+-----------+----------+--------------+ POP      Full           Yes      Yes                                 +---------+---------------+---------+-----------+----------+--------------+ PTV      Full                                                        +---------+---------------+---------+-----------+----------+--------------+ PERO     Full                                                         +---------+---------------+---------+-----------+----------+--------------+     Summary: RIGHT: - There is no evidence of deep vein thrombosis in the lower extremity.  - No cystic structure found in the popliteal fossa.  LEFT: - There is no evidence of deep vein thrombosis in the lower extremity.  - No cystic structure found in the popliteal fossa.  *See table(s) above for measurements and observations. Electronically signed by Monica Martinez MD on 01/21/2020 at 5:31:28 PM.    Final     Impression: Stage IV (T2b, N3, M1C) non-small cell lung cancer, squamous cell carcinoma presented with right upper lobe lung mass in addition to mediastinal and right supraclavicular lymphadenopathy as well as multiple metastatic bone lesions  The patient is received significant benefit from his palliative radiation therapy.  He is walking much better.  He continues to have some pain in the right hip but overall is much improved.  The other sites of metastasis treated with radiation therapy have cleared up in terms of pain issues  Plan: As needed follow-up in radiation oncology.  Patient will continue close follow-up with medical oncology and continue on therapy.  ____________________________________   Blair Promise, PhD, MD  This document serves as a record of services personally performed by Gery Pray, MD. It was created on his behalf by Clerance Lav, a trained medical scribe. The creation of this record is based on the scribe's personal observations and the provider's statements to them. This document has been checked and approved by the attending provider.

## 2020-02-13 NOTE — Telephone Encounter (Signed)
Scheduled per los. Called and left msg. Mailed printout  °

## 2020-02-13 NOTE — Progress Notes (Incomplete)
  Patient Name: Mike [name] MRN: [id number] DOB: 11-13-1965 Referring Physician: [name] (Profile Not Attached) Date of Service: 01/14/2020 [facility], [place]                                                        End Of Treatment Note  Diagnoses: C34.11-Malignant neoplasm of upper lobe, right bronchus or lung  Cancer Staging: Stage IV (T2b, N3, M1C) non-small cell lung cancer, squamous cell carcinoma presented with right upper lobe lung mass in addition to mediastinal and right supraclavicular lymphadenopathy as well as multiple metastatic bone lesions  Intent: Palliative  Radiation Treatment Dates: 01/01/2020 through 01/14/2020 Site Technique Total Dose (Gy) Dose per Fx (Gy) Completed Fx Beam Energies  Hip, Right: Pelvis_Rt 3D 30/30 3 10/10 10X, 15X  Ribs, Right: Chest_Rt 3D 30/30 3 10/10 10X  Chest: Chest 3D 30/30 3 10/10 6X   Narrative: The patient tolerated radiation therapy relatively well. During the second week of treatment, he began to report increased cough with clear phlegm and the inability to lay flat when sleeping. Although he also continued to report right hip pain, he did report overall improvement since beginning radiation therapy. He denied hemoptysis and difficulty swallowing.  Plan: The patient will follow-up with radiation oncology in one month.  ________________________________________________   Blair Promise, PhD, MD  This document serves as a record of services personally performed by Gery Pray, MD. It was created on his behalf by Clerance Lav, a trained medical scribe. The creation of this record is based on the scribe's personal observations and the provider's statements to them. This document has been checked and approved by the attending provider.

## 2020-02-14 ENCOUNTER — Inpatient Hospital Stay: Payer: 59

## 2020-02-14 ENCOUNTER — Encounter: Payer: Self-pay | Admitting: Radiation Oncology

## 2020-02-14 ENCOUNTER — Other Ambulatory Visit: Payer: Self-pay

## 2020-02-14 ENCOUNTER — Ambulatory Visit
Admission: RE | Admit: 2020-02-14 | Discharge: 2020-02-14 | Disposition: A | Discharge status: Home / Self Care | Payer: 59 | Source: Ambulatory Visit | Attending: Radiation Oncology | Admitting: Radiation Oncology

## 2020-02-14 VITALS — BP 133/94 | HR 107 | Temp 98.0°F | Resp 20

## 2020-02-14 DIAGNOSIS — C778 Secondary and unspecified malignant neoplasm of lymph nodes of multiple regions: Secondary | ICD-10-CM | POA: Diagnosis not present

## 2020-02-14 DIAGNOSIS — C3491 Malignant neoplasm of unspecified part of right bronchus or lung: Secondary | ICD-10-CM

## 2020-02-14 DIAGNOSIS — Z923 Personal history of irradiation: Secondary | ICD-10-CM | POA: Diagnosis not present

## 2020-02-14 DIAGNOSIS — Z5112 Encounter for antineoplastic immunotherapy: Secondary | ICD-10-CM | POA: Diagnosis not present

## 2020-02-14 DIAGNOSIS — C7951 Secondary malignant neoplasm of bone: Secondary | ICD-10-CM | POA: Insufficient documentation

## 2020-02-14 DIAGNOSIS — C3411 Malignant neoplasm of upper lobe, right bronchus or lung: Secondary | ICD-10-CM | POA: Insufficient documentation

## 2020-02-14 MED ORDER — PEGFILGRASTIM INJECTION 6 MG/0.6ML ~~LOC~~
6.0000 mg | PREFILLED_SYRINGE | Freq: Once | SUBCUTANEOUS | Status: AC
  Administered 2020-02-14: 11:00:00 6 mg via SUBCUTANEOUS

## 2020-02-14 MED ORDER — PEGFILGRASTIM INJECTION 6 MG/0.6ML ~~LOC~~
PREFILLED_SYRINGE | SUBCUTANEOUS | Status: AC
  Filled 2020-02-14: qty 0.6

## 2020-02-14 NOTE — Patient Instructions (Signed)
COVID-19 Vaccine Information can be found at: [email or web address] For questions related to vaccine distribution or appointments, please email [email or web address] or call [phone].    

## 2020-02-14 NOTE — Patient Instructions (Signed)

## 2020-02-19 ENCOUNTER — Inpatient Hospital Stay: Payer: 59

## 2020-02-19 ENCOUNTER — Encounter: Payer: Self-pay | Admitting: Medical

## 2020-02-19 ENCOUNTER — Other Ambulatory Visit: Payer: Self-pay

## 2020-02-19 ENCOUNTER — Inpatient Hospital Stay: Payer: 59 | Attending: Internal Medicine

## 2020-02-19 ENCOUNTER — Inpatient Hospital Stay (HOSPITAL_BASED_OUTPATIENT_CLINIC_OR_DEPARTMENT_OTHER): Payer: 59 | Admitting: Medical

## 2020-02-19 VITALS — BP 145/86 | HR 96 | Temp 97.8°F | Resp 18 | Ht 71.0 in | Wt 258.9 lb

## 2020-02-19 DIAGNOSIS — K589 Irritable bowel syndrome without diarrhea: Secondary | ICD-10-CM | POA: Diagnosis not present

## 2020-02-19 DIAGNOSIS — K219 Gastro-esophageal reflux disease without esophagitis: Secondary | ICD-10-CM | POA: Diagnosis not present

## 2020-02-19 DIAGNOSIS — Z86711 Personal history of pulmonary embolism: Secondary | ICD-10-CM | POA: Insufficient documentation

## 2020-02-19 DIAGNOSIS — Z5189 Encounter for other specified aftercare: Secondary | ICD-10-CM | POA: Diagnosis not present

## 2020-02-19 DIAGNOSIS — Z7952 Long term (current) use of systemic steroids: Secondary | ICD-10-CM | POA: Diagnosis not present

## 2020-02-19 DIAGNOSIS — Z5112 Encounter for antineoplastic immunotherapy: Secondary | ICD-10-CM | POA: Diagnosis present

## 2020-02-19 DIAGNOSIS — Z7901 Long term (current) use of anticoagulants: Secondary | ICD-10-CM | POA: Diagnosis not present

## 2020-02-19 DIAGNOSIS — G4733 Obstructive sleep apnea (adult) (pediatric): Secondary | ICD-10-CM | POA: Diagnosis not present

## 2020-02-19 DIAGNOSIS — E7800 Pure hypercholesterolemia, unspecified: Secondary | ICD-10-CM | POA: Diagnosis not present

## 2020-02-19 DIAGNOSIS — Z95828 Presence of other vascular implants and grafts: Secondary | ICD-10-CM

## 2020-02-19 DIAGNOSIS — C7951 Secondary malignant neoplasm of bone: Secondary | ICD-10-CM | POA: Diagnosis not present

## 2020-02-19 DIAGNOSIS — Z79899 Other long term (current) drug therapy: Secondary | ICD-10-CM | POA: Insufficient documentation

## 2020-02-19 DIAGNOSIS — C3491 Malignant neoplasm of unspecified part of right bronchus or lung: Secondary | ICD-10-CM

## 2020-02-19 DIAGNOSIS — E119 Type 2 diabetes mellitus without complications: Secondary | ICD-10-CM | POA: Diagnosis not present

## 2020-02-19 DIAGNOSIS — Z5111 Encounter for antineoplastic chemotherapy: Secondary | ICD-10-CM | POA: Insufficient documentation

## 2020-02-19 DIAGNOSIS — C3411 Malignant neoplasm of upper lobe, right bronchus or lung: Secondary | ICD-10-CM | POA: Insufficient documentation

## 2020-02-19 DIAGNOSIS — N390 Urinary tract infection, site not specified: Secondary | ICD-10-CM | POA: Diagnosis not present

## 2020-02-19 DIAGNOSIS — Z7984 Long term (current) use of oral hypoglycemic drugs: Secondary | ICD-10-CM | POA: Diagnosis not present

## 2020-02-19 DIAGNOSIS — R300 Dysuria: Secondary | ICD-10-CM | POA: Diagnosis not present

## 2020-02-19 DIAGNOSIS — Z87891 Personal history of nicotine dependence: Secondary | ICD-10-CM | POA: Insufficient documentation

## 2020-02-19 LAB — CBC WITH DIFFERENTIAL (CANCER CENTER ONLY)
Abs Immature Granulocytes: 0.69 10*3/uL — ABNORMAL HIGH (ref 0.00–0.07)
Basophils Absolute: 0.1 10*3/uL (ref 0.0–0.1)
Basophils Relative: 1 %
Eosinophils Absolute: 0.1 10*3/uL (ref 0.0–0.5)
Eosinophils Relative: 2 %
HCT: 36.4 % — ABNORMAL LOW (ref 39.0–52.0)
Hemoglobin: 12 g/dL — ABNORMAL LOW (ref 13.0–17.0)
Immature Granulocytes: 7 %
Lymphocytes Relative: 16 %
Lymphs Abs: 1.5 10*3/uL (ref 0.7–4.0)
MCH: 29.8 pg (ref 26.0–34.0)
MCHC: 33 g/dL (ref 30.0–36.0)
MCV: 90.3 fL (ref 80.0–100.0)
Monocytes Absolute: 1.1 10*3/uL — ABNORMAL HIGH (ref 0.1–1.0)
Monocytes Relative: 12 %
Neutro Abs: 5.9 10*3/uL (ref 1.7–7.7)
Neutrophils Relative %: 62 %
Platelet Count: 146 10*3/uL — ABNORMAL LOW (ref 150–400)
RBC: 4.03 MIL/uL — ABNORMAL LOW (ref 4.22–5.81)
RDW: 14.9 % (ref 11.5–15.5)
WBC Count: 9.4 10*3/uL (ref 4.0–10.5)
nRBC: 0.2 % (ref 0.0–0.2)

## 2020-02-19 LAB — CMP (CANCER CENTER ONLY)
ALT: 33 U/L (ref 0–44)
AST: 17 U/L (ref 15–41)
Albumin: 3.5 g/dL (ref 3.5–5.0)
Alkaline Phosphatase: 125 U/L (ref 38–126)
Anion gap: 7 (ref 5–15)
BUN: 9 mg/dL (ref 6–20)
CO2: 26 mmol/L (ref 22–32)
Calcium: 9.1 mg/dL (ref 8.9–10.3)
Chloride: 100 mmol/L (ref 98–111)
Creatinine: 0.72 mg/dL (ref 0.61–1.24)
GFR, Est AFR Am: >60 mL/min (ref >60)
GFR, Estimated: >60 mL/min (ref >60)
Glucose, Bld: 174 mg/dL — ABNORMAL HIGH (ref 70–99)
Potassium: 4 mmol/L (ref 3.5–5.1)
Sodium: 133 mmol/L — ABNORMAL LOW (ref 135–145)
Total Bilirubin: 0.4 mg/dL (ref 0.3–1.2)
Total Protein: 7 g/dL (ref 6.5–8.1)

## 2020-02-19 LAB — URINALYSIS, COMPLETE (UACMP) WITH MICROSCOPIC
Bilirubin Urine: NEGATIVE
Glucose, UA: NEGATIVE mg/dL
Ketones, ur: NEGATIVE mg/dL
Nitrite: POSITIVE — AB
Protein, ur: 100 mg/dL — AB
RBC / HPF: >50 RBC/hpf — ABNORMAL HIGH (ref 0–5)
Specific Gravity, Urine: 1.021 (ref 1.005–1.030)
WBC, UA: >50 WBC/hpf — ABNORMAL HIGH (ref 0–5)
pH: 5 (ref 5.0–8.0)

## 2020-02-19 MED ORDER — PHENAZOPYRIDINE HCL 200 MG PO TABS: 200.0000 mg | ORAL_TABLET | Freq: Three times a day (TID) | ORAL | 0 refills | Status: DC | PRN

## 2020-02-19 MED ORDER — SODIUM CHLORIDE 0.9% FLUSH
10.0000 mL | Freq: Once | INTRAVENOUS | Status: AC
  Administered 2020-02-19 (×2): 10 mL
  Filled 2020-02-19: qty 10

## 2020-02-19 MED ORDER — HEPARIN SOD (PORK) LOCK FLUSH 100 UNIT/ML IV SOLN
500.0000 [IU] | Freq: Once | INTRAVENOUS | Status: DC
  Administered 2020-02-19: 500 [IU]
  Filled 2020-02-19: qty 5

## 2020-02-19 MED ORDER — SULFAMETHOXAZOLE-TRIMETHOPRIM 400-80 MG PO TABS: 2.0000 | ORAL_TABLET | Freq: Two times a day (BID) | ORAL | 0 refills | Status: DC

## 2020-02-19 NOTE — Progress Notes (Signed)
Your urinalysis shows that you have a urinary tract infection. I will send the results of your urine culture as it becomes available.  Mike [name]

## 2020-02-19 NOTE — Patient Instructions (Signed)
Urinary Tract Infection, Adult A urinary tract infection (UTI) is an infection of any part of the urinary tract. The urinary tract includes:  The kidneys.  The ureters.  The bladder.  The urethra. These organs make, store, and get rid of pee (urine) in the body. What are the causes? This is caused by germs (bacteria) in your genital area. These germs grow and cause swelling (inflammation) of your urinary tract. What increases the risk? You are more likely to develop this condition if:  You have a small, thin tube (catheter) to drain pee.  You cannot control when you pee or poop (incontinence).  You are male, and: ? You use these methods to prevent pregnancy:  A medicine that kills sperm (spermicide).  A device that blocks sperm (diaphragm). ? You have low levels of a male hormone (estrogen). ? You are pregnant.  You have genes that add to your risk.  You are sexually active.  You take antibiotic medicines.  You have trouble peeing because of: ? A prostate that is bigger than normal, if you are male. ? A blockage in the part of your body that drains pee from the bladder (urethra). ? A kidney stone. ? A nerve condition that affects your bladder (neurogenic bladder). ? Not getting enough to drink. ? Not peeing often enough.  You have other conditions, such as: ? Diabetes. ? A weak disease-fighting system (immune system). ? Sickle cell disease. ? Gout. ? Injury of the spine. What are the signs or symptoms? Symptoms of this condition include:  Needing to pee right away (urgently).  Peeing often.  Peeing small amounts often.  Pain or burning when peeing.  Blood in the pee.  Pee that smells bad or not like normal.  Trouble peeing.  Pee that is cloudy.  Fluid coming from the vagina, if you are male.  Pain in the belly or lower back. Other symptoms include:  Throwing up (vomiting).  No urge to eat.  Feeling mixed up (confused).  Being tired  and grouchy (irritable).  A fever.  Watery poop (diarrhea). How is this treated? This condition may be treated with:  Antibiotic medicine.  Other medicines.  Drinking enough water. Follow these instructions at home:  Medicines  Take over-the-counter and prescription medicines only as told by your doctor.  If you were prescribed an antibiotic medicine, take it as told by your doctor. Do not stop taking it even if you start to feel better. General instructions  Make sure you: ? Pee until your bladder is empty. ? Do not hold pee for a long time. ? Empty your bladder after sex. ? Wipe from front to back after pooping if you are a male. Use each tissue one time when you wipe.  Drink enough fluid to keep your pee pale yellow.  Keep all follow-up visits as told by your doctor. This is important. Contact a doctor if:  You do not get better after 1-2 days.  Your symptoms go away and then come back. Get help right away if:  You have very bad back pain.  You have very bad pain in your lower belly.  You have a fever.  You are sick to your stomach (nauseous).  You are throwing up. Summary  A urinary tract infection (UTI) is an infection of any part of the urinary tract.  This condition is caused by germs in your genital area.  There are many risk factors for a UTI. These include having a small, thin   tube to drain pee and not being able to control when you pee or poop.  Treatment includes antibiotic medicines for germs.  Drink enough fluid to keep your pee pale yellow. This information is not intended to replace advice given to you by your health care provider. Make sure you discuss any questions you have with your health care provider. Document Revised: 09/21/2018 Document Reviewed: 04/13/2018 Elsevier Patient Education  2020 [employer]  

## 2020-02-19 NOTE — Progress Notes (Signed)
[facility] Progress Note   Mike [name] [id number] April 26, 1966 54 y.o.  Mike [name] is managed by: Dr. Fanny Bien. Mike [name]  Actively treated with chemotherapy/immunotherapy/hormonal therapy: yes  Current therapy: Carboplatin, paclitaxel, and Keytruda with Neulasta support.  Last treated: 02/12/2020 (cycle 2, day 1)  Next scheduled appointment with provider: 03/05/2020  Assessment: Plan:    Dysuria - Plan: Urine Culture, Urinalysis, Complete w Microscopic, sulfamethoxazole-trimethoprim (BACTRIM) 400-80 MG tablet, phenazopyridine (PYRIDIUM) 200 MG tablet, Urine Culture, Urinalysis, Complete w Microscopic  Stage IV squamous cell carcinoma of right lung (HCC)  Bony metastasis (HCC)   Dysuria versus prostatitis: A urinalysis returned with findings positive for UTI.  The patient was given Bactrim 400-80 mg, 2 tablets p.o. twice daily x14 days.  A urine culture is pending.  He was instructed to push fluids while on Bactrim.  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 Julien Nordmann and is status post cycle 2 of carboplatin, paclitaxel, and Keytruda with Neulasta support.  He is scheduled to be seen in follow-up on 02/04/2020.  Please see After Visit Summary for patient specific instructions.  Future Appointments  Date Time Provider [place]  02/22/2020  3:30 PM [name], MD [facility]  02/26/2020 10:30 AM [facility] None  02/26/2020 10:45 AM [facility] [place] FLUSH [facility] None  03/05/2020 10:15 AM [facility]FL[facility] None  03/05/2020 10:30 AM [facility] [place] FLUSH [facility] None  03/05/2020 11:00 AM [name], [name], PA-C [facility] None  03/05/2020 11:45 AM [facility] INFUSION [facility] None  03/07/2020 11:30 AM [facility] [place] FLUSH [facility] None  03/11/2020 10:30 AM [facility]FLUSH [facility] None  03/11/2020 10:45 AM [facility] [place] FLUSH  [facility] None  03/18/2020 10:30 AM [facility]FLUSH [facility] None  03/18/2020 10:45 AM [facility] [place] FLUSH [facility] None  03/25/2020  9:00 AM [facility]FLUSH [facility] None  03/25/2020  9:15 AM [facility] [place] FLUSH [facility] None  03/25/2020  9:45 AM [name], MD [facility] None  03/25/2020 10:45 AM [facility] INFUSION [facility] None  03/27/2020 10:30 AM [facility] [place] FLUSH [facility] None    Orders Placed This Encounter  Procedures  . Urine Culture  . Urinalysis, Complete w Microscopic       Subjective:   Patient ID:  Mike [name] is a 54 y.o. (DOB 13-Oct-1966) male.  Chief Complaint:  Chief Complaint  Patient presents with  . Urinary Tract Infection    Walk In    HPI Mike [name]  is a 54 y.o. 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 He is followed by Dr. Julien Nordmann and is status post cycle 2 of carboplatin, paclitaxel, and Keytruda with Neulasta support.  He reports having a several day history of bilateral flank pain, dysuria, and scant hematuria.  He has never had a urinary tract infection before.  He is also never had a kidney stone.  He denies any pain radiation from his flank to his groin.  He denies fevers, chills, or sweats.  He denies any hesitancy in voiding but reports that he does feel that he has incomplete voiding.  Medications: I have reviewed the patient's current medications.  Allergies: No Known Allergies  Past Medical History:  Diagnosis Date  . Arthritis    neck  . Cancer ([place])    lung stage 4   . Diabetes ([place])   . Fatty liver 12/05/2019   By  [place] 11/2019  . GERD (gastroesophageal reflux disease)   . 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 High cholesterol   . IBS (irritable bowel syndrome)   . OSA (obstructive sleep apnea) 09/15/2019   Sleep study 12/2017 - severe OSA with AHI 65.6, desat to 76% rec CPAP  . Severe obesity (BMI 35.0-39.9) with comorbidity ([place]) 09/04/2019      Past Surgical History:  Procedure Laterality Date  . BIOPSY  12/18/2019   Procedure: BIOPSY;  Surgeon: Garner Nash, DO;  Location: [facility];  Service: Pulmonary;;  . BRONCHIAL BRUSHINGS  12/18/2019   Procedure: BRONCHIAL BRUSHINGS;  Surgeon: Garner Nash, DO;  Location: [facility];  Service: Pulmonary;;  . BRONCHIAL WASHINGS  12/18/2019   Procedure: BRONCHIAL WASHINGS;  Surgeon: Garner Nash, DO;  Location: [facility];  Service: Pulmonary;;  . COLONOSCOPY    . CYSTECTOMY     knee- left knee cyst  . ENDOBRONCHIAL ULTRASOUND  12/18/2019   Procedure: ENDOBRONCHIAL ULTRASOUND;  Surgeon: Garner Nash, DO;  Location: [facility];  Service: Pulmonary;;  . FINE NEEDLE ASPIRATION  12/18/2019   Procedure: FINE NEEDLE ASPIRATION;  Surgeon: Garner Nash, DO;  Location: [facility];  Service: Pulmonary;;  . IR IMAGING GUIDED PORT INSERTION  12/27/2019  . PILONIDAL CYST EXCISION    . VIDEO BRONCHOSCOPY WITH ENDOBRONCHIAL ULTRASOUND Right 12/18/2019   Procedure: VIDEO BRONCHOSCOPY;  Surgeon: Garner Nash, DO;  Location: [facility];  Service: Pulmonary;  Laterality: Right;    Family History  Problem Relation Age of Onset  . CAD Mother        stents  . Stroke Neg Hx   . Diabetes Neg Hx   . Cancer Neg Hx   . Colon cancer Neg Hx   . Esophageal cancer Neg Hx   . Stomach cancer Neg Hx   . Rectal cancer Neg Hx     Social History   Socioeconomic History  . Marital status: Divorced    Spouse name: Not on file  . Number of children: Not on file  . Years of education: Not on file  . Highest education level: Not on file  Occupational History  . Not on file  Tobacco Use  . Smoking status: Former Smoker    Packs/day: 3.00    Years: 40.00    Pack years: 120.00    Quit date: 01/18/2020    Years since quitting: 0.0  . Smokeless tobacco: Never Used  . Tobacco comment: currently smoking less than 0.5ppd  Substance and Sexual Activity  . Alcohol use: Yes    Comment:  Rarely  . Drug use: Never  . Sexual activity: Yes    Partners: Female  Other Topics Concern  . Not on file  Social History Narrative   Lives with brother Claiborne Billings) and fiancee Mamie Nick)   Occ: Civil engineer, contracting    Edu: HS, self taught [employer]   Activity:    Diet:    Social Determinants of Health   Financial Resource Strain:   . Difficulty of Paying Living Expenses:   Food Insecurity:   . Worried About Charity fundraiser in the Last Year:   . Arboriculturist in the Last Year:   Transportation Needs:   . Film/video editor (Medical):   [name] Lack of Transportation (Non-Medical):   Physical Activity:   . Days of Exercise per Week:   . Minutes of Exercise per Session:   Stress:   .  Feeling of Stress :   Social Connections:   . Frequency of Communication with Friends and Family:   . Frequency of Social Gatherings with Friends and Family:   . Attends Religious Services:   . Active Member of Clubs or Organizations:   . Attends Archivist Meetings:   [name] Marital Status:   Intimate Partner Violence:   . Fear of Current or Ex-Partner:   . Emotionally Abused:   [name] Physically Abused:   . Sexually Abused:     Past Medical History, Surgical history, Social history, and Family history were reviewed and updated as appropriate.   Please see review of systems for further details on the patient's review from today.   Review of Systems:  Review of Systems  Constitutional: Negative for chills, diaphoresis and fever.  Respiratory: Negative for cough and shortness of breath.   Cardiovascular: Negative for chest pain, palpitations and leg swelling.  Gastrointestinal: Negative for abdominal pain, constipation, diarrhea, nausea and vomiting.  Genitourinary: Positive for dysuria, frequency and hematuria. Negative for difficulty urinating, flank pain and urgency.  Musculoskeletal: Positive for back pain.  Skin: Negative for rash.  Neurological: Negative for dizziness  and headaches.    Objective:   Physical Exam:  BP (!) 145/86 (BP Location: Left Arm, Patient Position: Sitting)   Pulse 96   Temp 97.8 F (36.6 C) (Temporal)   Resp 18   Ht 5\' 11"  (1.803 m)   Wt 258 lb 14.4 oz (117.4 kg)   SpO2 98%   BMI 36.11 kg/m  ECOG: 0  Physical Exam Constitutional:      General: He is not in acute distress.    Appearance: He is not diaphoretic.  HENT:     Head: Normocephalic and atraumatic.  Eyes:     General: No scleral icterus.       Right eye: No discharge.        Left eye: No discharge.  Cardiovascular:     Rate and Rhythm: Normal rate and regular rhythm.     Heart sounds: Normal heart sounds. No murmur. No friction rub. No gallop.   Pulmonary:     Effort: Pulmonary effort is normal. No respiratory distress.     Breath sounds: Normal breath sounds. No wheezing or rales.  Musculoskeletal:     Comments: Positive bilateral CVA tenderness.  Skin:    General: Skin is warm and dry.     Findings: No erythema or rash.  Neurological:     Mental Status: He is alert.     Coordination: Coordination normal.     Gait: Gait normal.  Psychiatric:        Mood and Affect: Mood normal.        Behavior: Behavior normal.        Thought Content: Thought content normal.        Judgment: Judgment normal.     Lab Review:     Component Value Date/Time   NA 133 (L) 02/19/2020 1024   K 4.0 02/19/2020 1024   CL 100 02/19/2020 1024   CO2 26 02/19/2020 1024   GLUCOSE 174 (H) 02/19/2020 1024   BUN 9 02/19/2020 1024   CREATININE 0.72 02/19/2020 1024   CALCIUM 9.1 02/19/2020 1024   PROT 7.0 02/19/2020 1024   ALBUMIN 3.5 02/19/2020 1024   AST 17 02/19/2020 1024   ALT 33 02/19/2020 1024   ALKPHOS 125 02/19/2020 1024   BILITOT 0.4 02/19/2020 1024   GFRNONAA >60 02/19/2020 1024   GFRAA >  60 02/19/2020 1024       Component Value Date/Time   WBC 9.4 02/19/2020 1024   WBC 16.6 (H) 01/23/2020 0525   RBC 4.03 (L) 02/19/2020 1024   HGB 12.0 (L) 02/19/2020  1024   HCT 36.4 (L) 02/19/2020 1024   PLT 146 (L) 02/19/2020 1024   MCV 90.3 02/19/2020 1024   MCH 29.8 02/19/2020 1024   MCHC 33.0 02/19/2020 1024   RDW 14.9 02/19/2020 1024   LYMPHSABS 1.5 02/19/2020 1024   MONOABS 1.1 (H) 02/19/2020 1024   EOSABS 0.1 02/19/2020 1024   BASOSABS 0.1 02/19/2020 1024   -------------------------------  Imaging from last 24 hours (if applicable):  Radiology interpretation: VAS [place] LOWER EXTREMITY VENOUS (DVT)  Result Date: 01/21/2020  Lower Venous DVTStudy Indications: Pulmonary embolism.  Risk Factors: Cancer. Comparison Study: No prior studies. Performing Technologist: Oliver Hum RVT  Examination Guidelines: A complete evaluation includes B-mode imaging, spectral Doppler, color Doppler, and power Doppler as needed of all accessible portions of each vessel. Bilateral testing is considered an integral part of a complete examination. Limited examinations for reoccurring indications may be performed as noted. The reflux portion of the exam is performed with the patient in reverse Trendelenburg.  +---------+---------------+---------+-----------+----------+--------------+ RIGHT    CompressibilityPhasicitySpontaneityPropertiesThrombus Aging +---------+---------------+---------+-----------+----------+--------------+ CFV      Full           Yes      Yes                                 +---------+---------------+---------+-----------+----------+--------------+ SFJ      Full                                                        +---------+---------------+---------+-----------+----------+--------------+ FV Prox  Full                                                        +---------+---------------+---------+-----------+----------+--------------+ FV Mid   Full                                                        +---------+---------------+---------+-----------+----------+--------------+ FV DistalFull                                                         +---------+---------------+---------+-----------+----------+--------------+ PFV      Full                                                        +---------+---------------+---------+-----------+----------+--------------+ POP      Full           Yes  Yes                                 +---------+---------------+---------+-----------+----------+--------------+ PTV      Full                                                        +---------+---------------+---------+-----------+----------+--------------+ PERO     Full                                                        +---------+---------------+---------+-----------+----------+--------------+   +---------+---------------+---------+-----------+----------+--------------+ LEFT     CompressibilityPhasicitySpontaneityPropertiesThrombus Aging +---------+---------------+---------+-----------+----------+--------------+ CFV      Full           Yes      Yes                                 +---------+---------------+---------+-----------+----------+--------------+ SFJ      Full                                                        +---------+---------------+---------+-----------+----------+--------------+ FV Prox  Full                                                        +---------+---------------+---------+-----------+----------+--------------+ FV Mid   Full                                                        +---------+---------------+---------+-----------+----------+--------------+ FV DistalFull                                                        +---------+---------------+---------+-----------+----------+--------------+ PFV      Full                                                        +---------+---------------+---------+-----------+----------+--------------+ POP      Full           Yes      Yes                                  +---------+---------------+---------+-----------+----------+--------------+ PTV      Full                                                        +---------+---------------+---------+-----------+----------+--------------+  PERO     Full                                                        +---------+---------------+---------+-----------+----------+--------------+     Summary: RIGHT: - There is no evidence of deep vein thrombosis in the lower extremity.  - No cystic structure found in the popliteal fossa.  LEFT: - There is no evidence of deep vein thrombosis in the lower extremity.  - No cystic structure found in the popliteal fossa.  *See table(s) above for measurements and observations. Electronically signed by Monica Martinez MD on 01/21/2020 at 5:31:28 PM.    Final

## 2020-02-21 LAB — URINE CULTURE: Culture: >=100000 — AB

## 2020-02-22 ENCOUNTER — Ambulatory Visit (INDEPENDENT_AMBULATORY_CARE_PROVIDER_SITE_OTHER): Payer: 59 | Admitting: Family Medicine

## 2020-02-22 ENCOUNTER — Other Ambulatory Visit: Payer: Self-pay | Admitting: Medical

## 2020-02-22 ENCOUNTER — Encounter: Payer: Self-pay | Admitting: Family Medicine

## 2020-02-22 ENCOUNTER — Other Ambulatory Visit: Payer: Self-pay

## 2020-02-22 VITALS — BP 116/66 | HR 99 | Temp 97.6°F | Ht 71.0 in | Wt 259.4 lb

## 2020-02-22 DIAGNOSIS — E1169 Type 2 diabetes mellitus with other specified complication: Secondary | ICD-10-CM | POA: Diagnosis not present

## 2020-02-22 DIAGNOSIS — Z1612 Extended spectrum beta lactamase (ESBL) resistance: Secondary | ICD-10-CM

## 2020-02-22 DIAGNOSIS — F4321 Adjustment disorder with depressed mood: Secondary | ICD-10-CM

## 2020-02-22 DIAGNOSIS — B9629 Other Escherichia coli [E. coli] as the cause of diseases classified elsewhere: Secondary | ICD-10-CM

## 2020-02-22 DIAGNOSIS — C7951 Secondary malignant neoplasm of bone: Secondary | ICD-10-CM

## 2020-02-22 DIAGNOSIS — R946 Abnormal results of thyroid function studies: Secondary | ICD-10-CM

## 2020-02-22 DIAGNOSIS — N390 Urinary tract infection, site not specified: Secondary | ICD-10-CM | POA: Diagnosis not present

## 2020-02-22 DIAGNOSIS — C3491 Malignant neoplasm of unspecified part of right bronchus or lung: Secondary | ICD-10-CM

## 2020-02-22 LAB — POCT GLYCOSYLATED HEMOGLOBIN (HGB A1C): Hemoglobin A1C: 7.7 % — AB (ref 4.0–5.6)

## 2020-02-22 MED ORDER — DIAZEPAM 5 MG PO TABS: 5.0000 mg | ORAL_TABLET | Freq: Every day | ORAL | 0 refills | Status: DC | PRN

## 2020-02-22 MED ORDER — CIPROFLOXACIN HCL 500 MG PO TABS: 500.0000 mg | ORAL_TABLET | Freq: Two times a day (BID) | ORAL | 0 refills | Status: DC

## 2020-02-22 MED ORDER — OXYCODONE-ACETAMINOPHEN 5-325 MG PO TABS: 1.0000 | ORAL_TABLET | Freq: Four times a day (QID) | ORAL | 0 refills | Status: DC | PRN

## 2020-02-22 MED ORDER — METFORMIN HCL 1000 MG PO TABS: 1000.0000 mg | ORAL_TABLET | Freq: Two times a day (BID) | ORAL | 3 refills | Status: DC

## 2020-02-22 NOTE — Assessment & Plan Note (Signed)
Recurrent UTI just started second cipro 1 wk course through onc.

## 2020-02-22 NOTE — Assessment & Plan Note (Signed)
Anticipate sick euthyroid. Onc monitoring this closely.

## 2020-02-22 NOTE — Assessment & Plan Note (Addendum)
Reviewed current mood. Situational depression stemming from recent cancer diagnosis. diazepam refilled today. He will let me know if feeling overwhelmed to discuss medication.

## 2020-02-22 NOTE — Assessment & Plan Note (Signed)
[place] CSRS reviewed. I will refill oxycodone 5/325 (currently using 2-3/day) #30 today, asked him to check with onc to see if they prefer we or their office fill in the future.

## 2020-02-22 NOTE — Progress Notes (Signed)
This visit was conducted in person.  BP 116/66 (BP Location: Left Arm, Patient Position: Sitting, Cuff Size: Large)   Pulse 99   Temp 97.6 F (36.4 C) (Temporal)   Ht 5\' 11"  (1.803 m)   Wt 259 lb 7 oz (117.7 kg)   SpO2 96%   BMI 36.18 kg/m    CC: DM  Subjective:    Patient ID: Mike [name], male    DOB: 03-09-1966, 54 y.o.   MRN: [id number]  HPI: Mike [name] is a 54 y.o. male presenting on 02/22/2020 for Diabetes (Here for 3 mo f/u.)   See my prior note for details.  Stage IV squamous lung cancer with mets undergoing palliative chemo through Port-A-Cath (carboplatin, paclitaxel, keytruda with neulasta support).   Has returned to work part time, on good weeks can work 20 hours/wk from home.   DM - recently elevated cbg to 300 during onc appt last month (managed with 7 units of insulin). Poor diet choices recently. Checks sugars every few days with onc - 120-150s. Highest reading 170. No hypoglycemia.  Lab Results  Component Value Date   HGBA1C 7.7 (A) 02/22/2020     Saw onc clinic on Tuesday 02/19/2020 - dx with UTI started on bactrim - changed to cipro after UCx returned with resistant E coli.   Managing pain with oxycodone 2-3 tablets a day.      Relevant past medical, surgical, family and social history reviewed and updated as indicated. Interim medical history since our last visit reviewed. Allergies and medications reviewed and updated. Outpatient Medications Prior to Visit  Medication Sig Dispense Refill  . albuterol (PROVENTIL) (2.5 MG/3ML) 0.083% nebulizer solution Take 3 mLs (2.5 mg total) by nebulization every 6 (six) hours as needed for wheezing or shortness of breath. 75 mL 12  . apixaban (ELIQUIS) 5 MG TABS tablet Take 2 tabs (10mg ) twice daily for 7 days. On 01/29/20, change to 1 tab (5mg ) twice a day. 60 tablet 3  . atorvastatin (LIPITOR) 20 MG tablet Take 1 tablet (20 mg total) by mouth daily. (Patient taking differently: Take 10 mg by mouth in the  morning and at bedtime. ) 90 tablet 3  . ciprofloxacin (CIPRO) 500 MG tablet Take 1 tablet (500 mg total) by mouth 2 (two) times daily. 14 tablet 0  . lidocaine-prilocaine (EMLA) cream Apply to the Port-A-Cath site 30 minutes before treatment (Patient taking differently: Apply 1 application topically as needed (port access). Apply to the Port-A-Cath site 30 minutes before treatment) 30 g 0  . omeprazole (PRILOSEC) 40 MG capsule Take 1 capsule (40 mg total) by mouth daily. For 3 weeks then as needed 30 capsule 3  . phenazopyridine (PYRIDIUM) 200 MG tablet Take 1 tablet (200 mg total) by mouth 3 (three) times daily as needed for pain. 21 tablet 0  . sildenafil (REVATIO) 20 MG tablet Take 20-100 mg by mouth daily as needed (ED).     . diazepam (VALIUM) 5 MG tablet TAKE 1 TABLET (5 MG TOTAL) BY MOUTH DAILY AS NEEDED FOR MUSCLE SPASMS. 30 tablet 0  . metFORMIN (GLUCOPHAGE) 1000 MG tablet Take 0.5 tablets (500 mg total) by mouth 2 (two) times daily with a meal. 180 tablet 3  . oxyCODONE-acetaminophen (PERCOCET/ROXICET) 5-325 MG tablet Take 1 tablet by mouth every 6 (six) hours as needed for severe pain. 30 tablet 0   No facility-administered medications prior to visit.     Per HPI unless specifically indicated in ROS section below Review  of Systems Objective:  BP 116/66 (BP Location: Left Arm, Patient Position: Sitting, Cuff Size: Large)   Pulse 99   Temp 97.6 F (36.4 C) (Temporal)   Ht 5\' 11"  (1.803 m)   Wt 259 lb 7 oz (117.7 kg)   SpO2 96%   BMI 36.18 kg/m   Wt Readings from Last 3 Encounters:  02/22/20 259 lb 7 oz (117.7 kg)  02/19/20 258 lb 14.4 oz (117.4 kg)  02/12/20 262 lb 8 oz (119.1 kg)      Physical Exam Vitals and nursing note reviewed.  Constitutional:      Appearance: Normal appearance. He is not ill-appearing.  Cardiovascular:     Rate and Rhythm: Normal rate and regular rhythm.     Pulses: Normal pulses.     Heart sounds: Normal heart sounds. No murmur.  Pulmonary:      Effort: Pulmonary effort is normal. No respiratory distress.     Breath sounds: Normal breath sounds. No wheezing, rhonchi or rales.  Musculoskeletal:     Right lower leg: No edema.     Left lower leg: No edema.  Skin:    General: Skin is warm and dry.     Findings: No rash.  Neurological:     Mental Status: He is alert.  Psychiatric:        Mood and Affect: Mood normal.        Behavior: Behavior normal.       Results for orders placed or performed in visit on 02/22/20  POCT glycosylated hemoglobin (Hb A1C)  Result Value Ref Range   Hemoglobin A1C 7.7 (A) 4.0 - 5.6 %   HbA1c POC (<> result, manual entry)     HbA1c, POC (prediabetic range)     HbA1c, POC (controlled diabetic range)     Assessment & Plan:  This visit occurred during the SARS-CoV-2 public health emergency.  Safety protocols were in place, including screening questions prior to the visit, additional usage of staff PPE, and extensive cleaning of exam room while observing appropriate contact time as indicated for disinfecting solutions.   Problem List Items Addressed This Visit    UTI due to extended-spectrum beta lactamase (ESBL) producing Escherichia coli    Recurrent UTI just started second cipro 1 wk course through onc.       Type 2 diabetes mellitus with other specified complication (HCC) - Primary (Chronic)    Had been taking metformin 500mg  bid - will increase dose to 1000mg  bid given increased A1c recently.       Relevant Medications   metFORMIN (GLUCOPHAGE) 1000 MG tablet   Other Relevant Orders   POCT glycosylated hemoglobin (Hb A1C) (Completed)   Stage IV squamous cell carcinoma of right lung ([place])    Appreciate onc care.  Continue palliative chemo (carboplatin, paclitaxel and keytruda, with neulasta support).       Relevant Medications   diazepam (VALIUM) 5 MG tablet   oxyCODONE-acetaminophen (PERCOCET/ROXICET) 5-325 MG tablet   Situational depression    Reviewed current mood. Situational  depression stemming from recent cancer diagnosis. diazepam refilled today. He will let me know if feeling overwhelmed to discuss medication.       Relevant Medications   diazepam (VALIUM) 5 MG tablet   Bony metastasis ([place])    [place] CSRS reviewed. I will refill oxycodone 5/325 (currently using 2-3/day) #30 today, asked him to check with onc to see if they prefer we or their office fill in the future.  Relevant Medications   diazepam (VALIUM) 5 MG tablet   oxyCODONE-acetaminophen (PERCOCET/ROXICET) 5-325 MG tablet   Abnormal thyroid function test    Anticipate sick euthyroid. Onc monitoring this closely.           Meds ordered this encounter  Medications  . metFORMIN (GLUCOPHAGE) 1000 MG tablet    Sig: Take 1 tablet (1,000 mg total) by mouth 2 (two) times daily with a meal.    Dispense:  180 tablet    Refill:  3  . diazepam (VALIUM) 5 MG tablet    Sig: Take 1 tablet (5 mg total) by mouth daily as needed for muscle spasms.    Dispense:  30 tablet    Refill:  0    Not to exceed 5 additional fills before 03/02/2020.  [name] oxyCODONE-acetaminophen (PERCOCET/ROXICET) 5-325 MG tablet    Sig: Take 1 tablet by mouth every 6 (six) hours as needed for severe pain.    Dispense:  30 tablet    Refill:  0   Orders Placed This Encounter  Procedures  . POCT glycosylated hemoglobin (Hb A1C)    Patient Instructions  A1c today.  Increase metformin to 1000 mg twice daily with meals.  I've refilled diazepam and oxycodone - touch base with onc about who is to fill pain medication in the future.    Follow up plan: Return in about 3 months (around 05/24/2020), or if symptoms worsen or fail to improve, for follow up visit.  [name], MD

## 2020-02-22 NOTE — Progress Notes (Signed)
These results were called to [name] and were reviewed with him . Bactrim was stopped and Cipro began.  Sandi Mealy, MHS, PA-C

## 2020-02-22 NOTE — Patient Instructions (Addendum)
A1c today.  Increase metformin to 1000 mg twice daily with meals.  I've refilled diazepam and oxycodone - touch base with onc about who is to fill pain medication in the future.

## 2020-02-22 NOTE — Assessment & Plan Note (Addendum)
Appreciate onc care.  Continue palliative chemo (carboplatin, paclitaxel and keytruda, with neulasta support).

## 2020-02-22 NOTE — Assessment & Plan Note (Signed)
Had been taking metformin 500mg  bid - will increase dose to 1000mg  bid given increased A1c recently.

## 2020-02-26 ENCOUNTER — Other Ambulatory Visit: Payer: Self-pay

## 2020-02-26 ENCOUNTER — Inpatient Hospital Stay: Payer: 59

## 2020-02-26 ENCOUNTER — Ambulatory Visit: Payer: 59 | Attending: Internal Medicine

## 2020-02-26 DIAGNOSIS — Z5112 Encounter for antineoplastic immunotherapy: Secondary | ICD-10-CM | POA: Diagnosis not present

## 2020-02-26 DIAGNOSIS — C3491 Malignant neoplasm of unspecified part of right bronchus or lung: Secondary | ICD-10-CM

## 2020-02-26 DIAGNOSIS — Z23 Encounter for immunization: Secondary | ICD-10-CM

## 2020-02-26 DIAGNOSIS — Z95828 Presence of other vascular implants and grafts: Secondary | ICD-10-CM

## 2020-02-26 LAB — CBC WITH DIFFERENTIAL (CANCER CENTER ONLY)
Abs Immature Granulocytes: 0.15 K/uL — ABNORMAL HIGH (ref 0.00–0.07)
Basophils Absolute: 0 K/uL (ref 0.0–0.1)
Basophils Relative: 1 %
Eosinophils Absolute: 0 K/uL (ref 0.0–0.5)
Eosinophils Relative: 1 %
HCT: 36.3 % — ABNORMAL LOW (ref 39.0–52.0)
Hemoglobin: 11.6 g/dL — ABNORMAL LOW (ref 13.0–17.0)
Immature Granulocytes: 2 %
Lymphocytes Relative: 20 %
Lymphs Abs: 1.7 K/uL (ref 0.7–4.0)
MCH: 29.7 pg (ref 26.0–34.0)
MCHC: 32 g/dL (ref 30.0–36.0)
MCV: 92.8 fL (ref 80.0–100.0)
Monocytes Absolute: 0.6 K/uL (ref 0.1–1.0)
Monocytes Relative: 8 %
Neutro Abs: 5.7 K/uL (ref 1.7–7.7)
Neutrophils Relative %: 68 %
Platelet Count: 192 K/uL (ref 150–400)
RBC: 3.91 MIL/uL — ABNORMAL LOW (ref 4.22–5.81)
RDW: 15.7 % — ABNORMAL HIGH (ref 11.5–15.5)
WBC Count: 8.3 K/uL (ref 4.0–10.5)
nRBC: 0 % (ref 0.0–0.2)

## 2020-02-26 LAB — CMP (CANCER CENTER ONLY)
ALT: 33 U/L (ref 0–44)
AST: 21 U/L (ref 15–41)
Albumin: 3.2 g/dL — ABNORMAL LOW (ref 3.5–5.0)
Alkaline Phosphatase: 99 U/L (ref 38–126)
Anion gap: 10 (ref 5–15)
BUN: 8 mg/dL (ref 6–20)
CO2: 25 mmol/L (ref 22–32)
Calcium: 8.5 mg/dL — ABNORMAL LOW (ref 8.9–10.3)
Chloride: 102 mmol/L (ref 98–111)
Creatinine: 0.77 mg/dL (ref 0.61–1.24)
GFR, Est AFR Am: >60 mL/min
GFR, Estimated: >60 mL/min
Glucose, Bld: 259 mg/dL — ABNORMAL HIGH (ref 70–99)
Potassium: 4.4 mmol/L (ref 3.5–5.1)
Sodium: 137 mmol/L (ref 135–145)
Total Bilirubin: 0.2 mg/dL — ABNORMAL LOW (ref 0.3–1.2)
Total Protein: 6.4 g/dL — ABNORMAL LOW (ref 6.5–8.1)

## 2020-02-26 MED ORDER — SODIUM CHLORIDE 0.9% FLUSH
10.0000 mL | Freq: Once | INTRAVENOUS | Status: DC
  Filled 2020-02-26: qty 10

## 2020-02-26 MED ORDER — HEPARIN SOD (PORK) LOCK FLUSH 100 UNIT/ML IV SOLN
500.0000 [IU] | Freq: Once | INTRAVENOUS | Status: DC
  Filled 2020-02-26: qty 5

## 2020-02-26 NOTE — Progress Notes (Signed)
   Covid-19 Vaccination Clinic  Name:  Mike [name]    MRN: [id number] DOB: 23-Jul-1966  02/26/2020  Mike [name] was observed post Covid-19 immunization for 15 minutes without incident. He was provided with Vaccine Information Sheet and instruction to access the V-Safe system.   Mike [name] was instructed to call 911 with any severe reactions post vaccine: [name] Difficulty breathing  . Swelling of face and throat  . A fast heartbeat  . A bad rash all over body  . Dizziness and weakness   Immunizations Administered    Name Date Dose VIS Date Route   Pfizer COVID-19 Vaccine 02/26/2020  1:42 PM 0.3 mL 12/12/2018 Intramuscular   Manufacturer: [place]   Lot: [id number]   [place]: [id number]

## 2020-02-26 NOTE — Patient Instructions (Signed)

## 2020-02-28 NOTE — Progress Notes (Signed)
Pharmacist Chemotherapy Monitoring - Follow Up Assessment    I verify that I have reviewed each item in the below checklist:  . Regimen for the patient is scheduled for the appropriate day and plan matches scheduled date. [name] Appropriate non-routine labs are ordered dependent on drug ordered. . If applicable, additional medications reviewed and ordered per protocol based on lifetime cumulative doses and/or treatment regimen.   Plan for follow-up and/or issues identified: No . I-vent associated with next due treatment: No . MD and/or nursing notified: No  Britt Boozer 02/28/2020 8:58 AM

## 2020-03-03 ENCOUNTER — Other Ambulatory Visit: Payer: Self-pay | Admitting: Internal Medicine

## 2020-03-03 ENCOUNTER — Other Ambulatory Visit: Payer: Self-pay | Admitting: Family Medicine

## 2020-03-03 DIAGNOSIS — C3491 Malignant neoplasm of unspecified part of right bronchus or lung: Secondary | ICD-10-CM

## 2020-03-03 DIAGNOSIS — C7951 Secondary malignant neoplasm of bone: Secondary | ICD-10-CM

## 2020-03-04 NOTE — Telephone Encounter (Signed)
Last rx: 02/22/20, #30/0  Plz deny this refill.  Error message states I do not have authority to refuse refill.

## 2020-03-04 NOTE — Progress Notes (Signed)
[facility] OFFICE PROGRESS NOTE  [name], MD [place] [address]  DIAGNOSIS: Stage IV (T2b, N3, M1C) non-small cell lung cancer, squamous cell carcinoma presented with right upper lobe lung mass in addition to mediastinal and right supraclavicular lymphadenopathy as well as multiple metastatic bone lesions diagnosed in March 2021.  PRIOR THERAPY: Palliative radiotherapy to the painful metastatic bone lesions under the care of Dr. Sondra Come. Last dose expected 01/14/20   CURRENT THERAPY: Palliative systemic chemotherapy withcarboplatin for AUC of 5, paclitaxel 175 NG/M2 and Keytruda 200 mg IV every 3 weeks with Neulasta support.First dose 01/03/20. Status post 2 cycles.   INTERVAL HISTORY: Mike [name] 54 y.o. male returns to clinic today for a follow-up visit.  The patient is feeling well today without any concerning complaints except.  The patient was seen recently in the symptom management clinic for the chief complaint of flank pain, dysuria, and scant hematuria. The patient was found to have a urinary tract infection was treated with antibiotics. The patient's symptoms have resolved at this time. The patient also recently had a follow-up visit with his PCP who adjusted his medication for his diabetes mellitus and also started him on Valium for depression. The patient had completed palliative radiotherapy to the painful metastatic bone lesions. He occasionally gets right hip pain, particularly when he walks. He notes the pain is better compared to when he was first diagnosed, but he occasionally needs to take a percocet. He states he takes percocet about 2x per day. He is requesting a refill.   Otherwise, the patient is feeling well today and denies any recent fever, chills, or weight loss. He sometimes has night sweats the evening following his infusion.  He denies any chest pain or hemoptysis.  He reports his baseline dyspnea on exertion and mild  baseline productive cough.  He denies any nausea, vomiting, diarrhea, or constipation.  He denies any rashes or skin changes.  He denies any headache or visual changes.  The patient denies any more signs and symptoms of infection including dysuria.  The patient is here today for evaluation before starting cycle #3.   MEDICAL HISTORY: Past Medical History:  Diagnosis Date  . Arthritis    neck  . Cancer ([facility])    lung stage 4   . Diabetes ([facility])   . Fatty liver 12/05/2019   By [place] 11/2019  . GERD (gastroesophageal reflux disease)   . 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 High cholesterol   . IBS (irritable bowel syndrome)   . OSA (obstructive sleep apnea) 09/15/2019   Sleep study 12/2017 - severe OSA with AHI 65.6, desat to 76% rec CPAP  . Severe obesity (BMI 35.0-39.9) with comorbidity ([facility]) 09/04/2019    ALLERGIES:  has No Known Allergies.  MEDICATIONS:  Current Outpatient Medications  Medication Sig Dispense Refill  . albuterol (PROVENTIL) (2.5 MG/3ML) 0.083% nebulizer solution Take 3 mLs (2.5 mg total) by nebulization every 6 (six) hours as needed for wheezing or shortness of breath. 75 mL 12  . apixaban (ELIQUIS) 5 MG TABS tablet Take 2 tabs (10mg ) twice daily for 7 days. On 01/29/20, change to 1 tab (5mg ) twice a day. 60 tablet 3  . atorvastatin (LIPITOR) 20 MG tablet Take 1 tablet (20 mg total) by mouth daily. (Patient taking differently: Take 10 mg by mouth in the morning and at bedtime. ) 90 tablet 3  . diazepam (VALIUM) 5 MG tablet  Take 1 tablet (5 mg total) by mouth daily as needed for muscle spasms. 30 tablet 0  . lidocaine-prilocaine (EMLA) cream Apply to the Port-A-Cath site 30 minutes before treatment 30 g 0  . metFORMIN (GLUCOPHAGE) 1000 MG tablet Take 1 tablet (1,000 mg total) by mouth 2 (two) times daily with a meal. 180 tablet 3  . omeprazole (PRILOSEC) 40 MG capsule Take 1 capsule (40 mg total) by mouth daily. For 3 weeks then as needed 30 capsule 3  .  oxyCODONE-acetaminophen (PERCOCET/ROXICET) 5-325 MG tablet Take 1 tablet by mouth every 6 (six) hours as needed for severe pain. 30 tablet 0  . sildenafil (REVATIO) 20 MG tablet Take 20-100 mg by mouth daily as needed (ED).     . phenazopyridine (PYRIDIUM) 200 MG tablet Take 1 tablet (200 mg total) by mouth 3 (three) times daily as needed for pain. (Patient not taking: Reported on 03/05/2020) 21 tablet 0   No current facility-administered medications for this visit.   Facility-Administered Medications Ordered in Other Visits  Medication Dose Route Frequency Provider Last Rate Last Admin  . 0.9 %  sodium chloride infusion   Intravenous Once [name], MD      . CARBOplatin (PARAPLATIN) 750 mg in sodium chloride 0.9 % 250 mL chemo infusion  750 mg Intravenous Once [name], MD      . dexamethasone (DECADRON) 10 mg in sodium chloride 0.9 % 50 mL IVPB  10 mg Intravenous Once [name], MD      . diphenhydrAMINE (BENADRYL) injection 50 mg  50 mg Intravenous Once [name], MD      . famotidine (PEPCID) IVPB 20 mg premix  20 mg Intravenous Once [name], MD      . fosaprepitant (EMEND) 150 mg in sodium chloride 0.9 % 145 mL IVPB  150 mg Intravenous Once [name], MD      . heparin lock flush 100 unit/mL  500 Units Intracatheter Once PRN [name], MD      . PACLitaxel (TAXOL) 438 mg in sodium chloride 0.9 % 500 mL chemo infusion (> 80mg /m2)  175 mg/m2 (Treatment Plan Recorded) Intravenous Once [name], MD      . palonosetron (ALOXI) injection 0.25 mg  0.25 mg Intravenous Once [name], MD      . pembrolizumab [facility]) 200 mg in sodium chloride 0.9 % 50 mL chemo infusion  200 mg Intravenous Once [name], MD      . sodium chloride flush (NS) 0.9 % injection 10 mL  10 mL Intracatheter PRN [name], MD        SURGICAL HISTORY:  Past Surgical History:  Procedure Laterality Date  . BIOPSY  12/18/2019   Procedure:  BIOPSY;  Surgeon: Garner Nash, DO;  Location: [facility];  Service: Pulmonary;;  . BRONCHIAL BRUSHINGS  12/18/2019   Procedure: BRONCHIAL BRUSHINGS;  Surgeon: Garner Nash, DO;  Location: [facility];  Service: Pulmonary;;  . BRONCHIAL WASHINGS  12/18/2019   Procedure: BRONCHIAL WASHINGS;  Surgeon: Garner Nash, DO;  Location: [facility];  Service: Pulmonary;;  . COLONOSCOPY    . CYSTECTOMY     knee- left knee cyst  . ENDOBRONCHIAL ULTRASOUND  12/18/2019   Procedure: ENDOBRONCHIAL ULTRASOUND;  Surgeon: Garner Nash, DO;  Location: [facility];  Service: Pulmonary;;  . FINE NEEDLE ASPIRATION  12/18/2019   Procedure: FINE NEEDLE ASPIRATION;  Surgeon: Garner Nash, DO;  Location: [facility];  Service: Pulmonary;;  . IR IMAGING GUIDED  PORT INSERTION  12/27/2019  . PILONIDAL CYST EXCISION    . VIDEO BRONCHOSCOPY WITH ENDOBRONCHIAL ULTRASOUND Right 12/18/2019   Procedure: VIDEO BRONCHOSCOPY;  Surgeon: Garner Nash, DO;  Location: [facility];  Service: Pulmonary;  Laterality: Right;    REVIEW OF SYSTEMS:   Review of Systems  Constitutional: Negative for appetite change, chills, fatigue, fever and unexpected weight change.  HENT:   Negative for mouth sores, nosebleeds, sore throat and trouble swallowing.   Eyes: Negative for eye problems and icterus.  Respiratory: Positive for baseline dyspnea on exertion and baseline cough. Negative for hemoptysis, and wheezing.    Gastrointestinal: Negative for abdominal pain, constipation, diarrhea, nausea and vomiting.  Genitourinary: Negative for bladder incontinence, difficulty urinating, dysuria, frequency and hematuria.   Musculoskeletal: Positive for occasional right hip pain.  Negative for back pain, gait problem, neck pain and neck stiffness.  Skin: Negative for itching and rash.  Neurological: Negative for dizziness, extremity weakness, gait problem, headaches, light-headedness and seizures.  Hematological: Negative for  adenopathy. Does not bruise/bleed easily.  Psychiatric/Behavioral: Negative for confusion, depression and sleep disturbance. The patient is not nervous/anxious.     PHYSICAL EXAMINATION:  Blood pressure 129/80, pulse (!) 101, temperature (!) 97 F (36.1 C), temperature source Temporal, resp. rate 18, height 5\' 11"  (1.803 m), weight 265 lb 8 oz (120.4 kg), SpO2 97 %.  ECOG PERFORMANCE STATUS: 1 - Symptomatic but completely ambulatory  Physical Exam  Constitutional: Oriented to person, place, and time and well-developed, well-nourished, and in no distress.  HENT:  Head: Normocephalic and atraumatic.  Mouth/Throat: Oropharynx is clear and moist. No oropharyngeal exudate.  Eyes: Conjunctivae are normal. Right eye exhibits no discharge. Left eye exhibits no discharge. No scleral icterus.  Neck: Normal range of motion. Neck supple.  Cardiovascular: Normal rate, regular rhythm, normal heart sounds and intact distal pulses.   Pulmonary/Chest: Effort normal and breath sounds normal. No respiratory distress. No wheezes. No rales.  Abdominal: Soft. Bowel sounds are normal. Exhibits no distension and no mass. There is no tenderness.  Musculoskeletal: Normal range of motion. Exhibits no edema.  Lymphadenopathy:    No cervical adenopathy.  Neurological: Alert and oriented to person, place, and time. Exhibits normal muscle tone. Gait normal. Coordination normal.  Skin: Skin is warm and dry. No rash noted. Not diaphoretic. No erythema. No pallor.  Psychiatric: Mood, memory and judgment normal.  Vitals reviewed.  LABORATORY DATA: Lab Results  Component Value Date   WBC 8.2 03/05/2020   HGB 12.1 (L) 03/05/2020   HCT 37.0 (L) 03/05/2020   MCV 93.0 03/05/2020   PLT 254 03/05/2020      Chemistry      Component Value Date/Time   NA 138 03/05/2020 1022   K 4.1 03/05/2020 1022   CL 103 03/05/2020 1022   CO2 27 03/05/2020 1022   BUN 7 03/05/2020 1022   CREATININE 0.72 03/05/2020 1022       Component Value Date/Time   CALCIUM 8.5 (L) 03/05/2020 1022   ALKPHOS 86 03/05/2020 1022   AST 22 03/05/2020 1022   ALT 36 03/05/2020 1022   BILITOT 0.3 03/05/2020 1022       RADIOGRAPHIC STUDIES:  No results found.   ASSESSMENT/PLAN:  This is a very pleasant 28 year oldCaucasianmale recently diagnosed with a stage IV (T2b, N3, M1C) non-small cell lung cancer, squamous cell carcinoma presented with right upper lobe lung mass in addition to mediastinal and right supraclavicular lymphadenopathy as well as multiple metastatic bone lesions  diagnosed in March 2021.  The patient completed palliative radiotherapy to the painful bone lesions under the care of Dr.Kinard.Last treatment on 01/14/20.  The patient is currently undergoing palliative systemic chemotherapy with carboplatin for an AUC of 5, paclitaxel 175mg /m, and Keytruda 200 mg IV every 3 weeks with Neulasta support. He is status post 2 cycles. He tolerated it well exceptexcept for mildly sore gums.  The patient was seen with Dr. Julien Nordmann today.  Labs were reviewed.  He will receive cycle #3 today scheduled.  I will arrange for restaging CT scan of his chest, abdomen, and pelvis prior to his next cycle of treatment. We will be able to evaluate his hip as well as other sites of metastatic lesions.   We will see him back for follow-up visit in 3 weeks for evaluation and to review his scan before starting cycle #4.  He will continue taking eliquis for his recent pulmonary embolism.  I have sent a refill of his oxycodone to his pharmacy.   The patient was advised to call immediately if he has any concerning symptoms in the interval. The patient voices understanding of current disease status and treatment options and is in agreement with the current care plan. All questions were answered. The patient knows to call the clinic with any problems, questions or concerns. We can certainly see the patient much sooner if  necessary   Orders Placed This Encounter  Procedures  . CT Chest W Contrast    Standing Status:   Future    Standing Expiration Date:   03/05/2021    Order Specific Question:   ** REASON FOR EXAM (FREE TEXT)    Answer:   Restaging lung cancer    Order Specific Question:   If indicated for the ordered procedure, I authorize the administration of contrast media per Radiology protocol    Answer:   Yes    Order Specific Question:   Preferred imaging location?    Answer:   [facility]    Order Specific Question:   Radiology Contrast Protocol - do NOT remove file path    Answer:   \\[email or web address]  . CT Abdomen Pelvis W Contrast    Standing Status:   Future    Standing Expiration Date:   03/05/2021    Order Specific Question:   ** REASON FOR EXAM (FREE TEXT)    Answer:   Restaging Lung Cancer    Order Specific Question:   If indicated for the ordered procedure, I authorize the administration of contrast media per Radiology protocol    Answer:   Yes    Order Specific Question:   Preferred imaging location?    Answer:   [facility]    Order Specific Question:   Is Oral Contrast requested for this exam?    Answer:   Yes, Per Radiology protocol    Order Specific Question:   Radiology Contrast Protocol - do NOT remove file path    Answer:   \\[email or web address]     Paulo Keimig L Tearah Saulsbury, PA-C 03/05/20  ADDENDUM: Hematology/oncology Attending: I had a face-to-face encounter with the patient today.  I recommended his care plan.  This is a very pleasant 54 years old white male with stage IV non-small cell lung cancer, squamous cell carcinoma and he is currently undergoing systemic chemotherapy with carboplatin, paclitaxel and Keytruda status post 2 cycles.  The patient has been tolerating his treatment well with no concerning adverse effect except for mild fatigue and sore gums.  I recommended for him to proceed with cycle  #3 today as planned. We will see him back for follow-up visit in 3 weeks for evaluation with repeat CT scan of the chest, abdomen pelvis for restaging of his disease. The patient was advised to call immediately if he has any concerning symptoms in the interval.  Disclaimer: This note was dictated with voice recognition software. Similar sounding words can inadvertently be transcribed and may be missed upon review. Eilleen Kempf, MD 03/05/20

## 2020-03-05 ENCOUNTER — Inpatient Hospital Stay (HOSPITAL_BASED_OUTPATIENT_CLINIC_OR_DEPARTMENT_OTHER): Payer: 59 | Admitting: Physician Assistant

## 2020-03-05 ENCOUNTER — Inpatient Hospital Stay: Payer: 59

## 2020-03-05 ENCOUNTER — Other Ambulatory Visit: Payer: Self-pay

## 2020-03-05 ENCOUNTER — Encounter: Payer: Self-pay | Admitting: Physician Assistant

## 2020-03-05 VITALS — BP 129/80 | HR 101 | Temp 97.0°F | Resp 18 | Ht 71.0 in | Wt 265.5 lb

## 2020-03-05 VITALS — HR 91

## 2020-03-05 DIAGNOSIS — C3491 Malignant neoplasm of unspecified part of right bronchus or lung: Secondary | ICD-10-CM | POA: Diagnosis not present

## 2020-03-05 DIAGNOSIS — C7951 Secondary malignant neoplasm of bone: Secondary | ICD-10-CM

## 2020-03-05 DIAGNOSIS — Z5112 Encounter for antineoplastic immunotherapy: Secondary | ICD-10-CM

## 2020-03-05 DIAGNOSIS — Z5111 Encounter for antineoplastic chemotherapy: Secondary | ICD-10-CM | POA: Diagnosis not present

## 2020-03-05 DIAGNOSIS — Z95828 Presence of other vascular implants and grafts: Secondary | ICD-10-CM

## 2020-03-05 LAB — CMP (CANCER CENTER ONLY)
ALT: 36 U/L (ref 0–44)
AST: 22 U/L (ref 15–41)
Albumin: 3.6 g/dL (ref 3.5–5.0)
Alkaline Phosphatase: 86 U/L (ref 38–126)
Anion gap: 8 (ref 5–15)
BUN: 7 mg/dL (ref 6–20)
CO2: 27 mmol/L (ref 22–32)
Calcium: 8.5 mg/dL — ABNORMAL LOW (ref 8.9–10.3)
Chloride: 103 mmol/L (ref 98–111)
Creatinine: 0.72 mg/dL (ref 0.61–1.24)
GFR, Est AFR Am: >60 mL/min (ref >60)
GFR, Estimated: >60 mL/min (ref >60)
Glucose, Bld: 179 mg/dL — ABNORMAL HIGH (ref 70–99)
Potassium: 4.1 mmol/L (ref 3.5–5.1)
Sodium: 138 mmol/L (ref 135–145)
Total Bilirubin: 0.3 mg/dL (ref 0.3–1.2)
Total Protein: 6.7 g/dL (ref 6.5–8.1)

## 2020-03-05 LAB — CBC WITH DIFFERENTIAL (CANCER CENTER ONLY)
Abs Immature Granulocytes: 0.05 10*3/uL (ref 0.00–0.07)
Basophils Absolute: 0.1 10*3/uL (ref 0.0–0.1)
Basophils Relative: 1 %
Eosinophils Absolute: 0 10*3/uL (ref 0.0–0.5)
Eosinophils Relative: 0 %
HCT: 37 % — ABNORMAL LOW (ref 39.0–52.0)
Hemoglobin: 12.1 g/dL — ABNORMAL LOW (ref 13.0–17.0)
Immature Granulocytes: 1 %
Lymphocytes Relative: 24 %
Lymphs Abs: 2 10*3/uL (ref 0.7–4.0)
MCH: 30.4 pg (ref 26.0–34.0)
MCHC: 32.7 g/dL (ref 30.0–36.0)
MCV: 93 fL (ref 80.0–100.0)
Monocytes Absolute: 0.9 10*3/uL (ref 0.1–1.0)
Monocytes Relative: 11 %
Neutro Abs: 5.2 10*3/uL (ref 1.7–7.7)
Neutrophils Relative %: 63 %
Platelet Count: 254 10*3/uL (ref 150–400)
RBC: 3.98 MIL/uL — ABNORMAL LOW (ref 4.22–5.81)
RDW: 17.2 % — ABNORMAL HIGH (ref 11.5–15.5)
WBC Count: 8.2 10*3/uL (ref 4.0–10.5)
nRBC: 0 % (ref 0.0–0.2)

## 2020-03-05 LAB — TSH: TSH: 0.142 u[IU]/mL — ABNORMAL LOW (ref 0.320–4.118)

## 2020-03-05 MED ORDER — PALONOSETRON HCL INJECTION 0.25 MG/5ML
INTRAVENOUS | Status: AC
  Filled 2020-03-05: qty 5

## 2020-03-05 MED ORDER — LIDOCAINE-PRILOCAINE 2.5-2.5 % EX CREA: TOPICAL_CREAM | CUTANEOUS | 0 refills | Status: DC

## 2020-03-05 MED ORDER — OXYCODONE-ACETAMINOPHEN 5-325 MG PO TABS: 1.0000 | ORAL_TABLET | Freq: Four times a day (QID) | ORAL | 0 refills | Status: DC | PRN

## 2020-03-05 MED ORDER — DIPHENHYDRAMINE HCL 50 MG/ML IJ SOLN
50.0000 mg | Freq: Once | INTRAMUSCULAR | Status: AC
  Administered 2020-03-05: 50 mg via INTRAVENOUS

## 2020-03-05 MED ORDER — SODIUM CHLORIDE 0.9 % IV SOLN
750.0000 mg | Freq: Once | INTRAVENOUS | Status: AC
  Administered 2020-03-05: 750 mg via INTRAVENOUS
  Filled 2020-03-05: qty 75

## 2020-03-05 MED ORDER — DIPHENHYDRAMINE HCL 50 MG/ML IJ SOLN
INTRAMUSCULAR | Status: AC
  Filled 2020-03-05: qty 1

## 2020-03-05 MED ORDER — SODIUM CHLORIDE 0.9 % IV SOLN
Freq: Once | INTRAVENOUS | Status: AC
  Filled 2020-03-05: qty 250

## 2020-03-05 MED ORDER — SODIUM CHLORIDE 0.9 % IV SOLN
200.0000 mg | Freq: Once | INTRAVENOUS | Status: AC
  Administered 2020-03-05: 200 mg via INTRAVENOUS
  Filled 2020-03-05: qty 8

## 2020-03-05 MED ORDER — FAMOTIDINE IN NACL 20-0.9 MG/50ML-% IV SOLN
20.0000 mg | Freq: Once | INTRAVENOUS | Status: AC
  Administered 2020-03-05: 20 mg via INTRAVENOUS

## 2020-03-05 MED ORDER — FAMOTIDINE IN NACL 20-0.9 MG/50ML-% IV SOLN
INTRAVENOUS | Status: AC
  Filled 2020-03-05: qty 50

## 2020-03-05 MED ORDER — SODIUM CHLORIDE 0.9% FLUSH
10.0000 mL | INTRAVENOUS | Status: DC | PRN
  Administered 2020-03-05: 10 mL
  Filled 2020-03-05: qty 10

## 2020-03-05 MED ORDER — PALONOSETRON HCL INJECTION 0.25 MG/5ML
0.2500 mg | Freq: Once | INTRAVENOUS | Status: AC
  Administered 2020-03-05: 0.25 mg via INTRAVENOUS

## 2020-03-05 MED ORDER — SODIUM CHLORIDE 0.9 % IV SOLN
10.0000 mg | Freq: Once | INTRAVENOUS | Status: AC
  Administered 2020-03-05: 10 mg via INTRAVENOUS
  Filled 2020-03-05: qty 10

## 2020-03-05 MED ORDER — SODIUM CHLORIDE 0.9 % IV SOLN
175.0000 mg/m2 | Freq: Once | INTRAVENOUS | Status: AC
  Administered 2020-03-05: 438 mg via INTRAVENOUS
  Filled 2020-03-05: qty 73

## 2020-03-05 MED ORDER — HEPARIN SOD (PORK) LOCK FLUSH 100 UNIT/ML IV SOLN
500.0000 [IU] | Freq: Once | INTRAVENOUS | Status: AC | PRN
  Administered 2020-03-05: 500 [IU]
  Filled 2020-03-05: qty 5

## 2020-03-05 MED ORDER — SODIUM CHLORIDE 0.9% FLUSH
10.0000 mL | Freq: Once | INTRAVENOUS | Status: AC
  Administered 2020-03-05: 10 mL
  Filled 2020-03-05: qty 10

## 2020-03-05 MED ORDER — SODIUM CHLORIDE 0.9 % IV SOLN
150.0000 mg | Freq: Once | INTRAVENOUS | Status: AC
  Administered 2020-03-05: 150 mg via INTRAVENOUS
  Filled 2020-03-05: qty 150

## 2020-03-05 NOTE — Patient Instructions (Signed)
[facility] Discharge Instructions for Patients Receiving Chemotherapy  Today you received the following chemotherapy agents: Keytruda, Taxol, Carboplatin  To help prevent nausea and vomiting after your treatment, we encourage you to take your nausea medication as directed.   If you develop nausea and vomiting that is not controlled by your nausea medication, call the clinic.   BELOW ARE SYMPTOMS THAT SHOULD BE REPORTED IMMEDIATELY:  *FEVER GREATER THAN 100.5 F  *CHILLS WITH OR WITHOUT FEVER  NAUSEA AND VOMITING THAT IS NOT CONTROLLED WITH YOUR NAUSEA MEDICATION  *UNUSUAL SHORTNESS OF BREATH  *UNUSUAL BRUISING OR BLEEDING  TENDERNESS IN MOUTH AND THROAT WITH OR WITHOUT PRESENCE OF ULCERS  *URINARY PROBLEMS  *BOWEL PROBLEMS  UNUSUAL RASH Items with * indicate a potential emergency and should be followed up as soon as possible.  Feel free to call the clinic should you have any questions or concerns. The clinic phone number is [phone].  Please show the CHEMO ALERT CARD at check-in to the Emergency Department and triage nurse.   

## 2020-03-07 ENCOUNTER — Other Ambulatory Visit: Payer: Self-pay

## 2020-03-07 ENCOUNTER — Inpatient Hospital Stay: Payer: 59

## 2020-03-07 VITALS — BP 123/79 | HR 100 | Temp 98.7°F | Resp 18

## 2020-03-07 DIAGNOSIS — C3491 Malignant neoplasm of unspecified part of right bronchus or lung: Secondary | ICD-10-CM

## 2020-03-07 DIAGNOSIS — Z5112 Encounter for antineoplastic immunotherapy: Secondary | ICD-10-CM | POA: Diagnosis not present

## 2020-03-07 MED ORDER — PEGFILGRASTIM INJECTION 6 MG/0.6ML ~~LOC~~
6.0000 mg | PREFILLED_SYRINGE | Freq: Once | SUBCUTANEOUS | Status: AC
  Administered 2020-03-07: 6 mg via SUBCUTANEOUS

## 2020-03-07 MED ORDER — PEGFILGRASTIM INJECTION 6 MG/0.6ML ~~LOC~~
PREFILLED_SYRINGE | SUBCUTANEOUS | Status: AC
  Filled 2020-03-07: qty 0.6

## 2020-03-07 NOTE — Patient Instructions (Signed)

## 2020-03-10 ENCOUNTER — Telehealth: Payer: Self-pay | Admitting: Physician Assistant

## 2020-03-10 NOTE — Telephone Encounter (Signed)
Scheduled per los. Called and left msg. Mailed printout  °

## 2020-03-11 ENCOUNTER — Inpatient Hospital Stay: Payer: 59

## 2020-03-11 ENCOUNTER — Other Ambulatory Visit: Payer: Self-pay

## 2020-03-11 DIAGNOSIS — Z5112 Encounter for antineoplastic immunotherapy: Secondary | ICD-10-CM | POA: Diagnosis not present

## 2020-03-11 DIAGNOSIS — C3491 Malignant neoplasm of unspecified part of right bronchus or lung: Secondary | ICD-10-CM

## 2020-03-11 DIAGNOSIS — Z95828 Presence of other vascular implants and grafts: Secondary | ICD-10-CM

## 2020-03-11 LAB — CMP (CANCER CENTER ONLY)
ALT: 35 U/L (ref 0–44)
AST: 22 U/L (ref 15–41)
Albumin: 3.7 g/dL (ref 3.5–5.0)
Alkaline Phosphatase: 159 U/L — ABNORMAL HIGH (ref 38–126)
Anion gap: 9 (ref 5–15)
BUN: 8 mg/dL (ref 6–20)
CO2: 29 mmol/L (ref 22–32)
Calcium: 9.5 mg/dL (ref 8.9–10.3)
Chloride: 100 mmol/L (ref 98–111)
Creatinine: 0.72 mg/dL (ref 0.61–1.24)
GFR, Est AFR Am: >60 mL/min (ref >60)
GFR, Estimated: >60 mL/min (ref >60)
Glucose, Bld: 203 mg/dL — ABNORMAL HIGH (ref 70–99)
Potassium: 4.3 mmol/L (ref 3.5–5.1)
Sodium: 138 mmol/L (ref 135–145)
Total Bilirubin: 0.2 mg/dL — ABNORMAL LOW (ref 0.3–1.2)
Total Protein: 6.8 g/dL (ref 6.5–8.1)

## 2020-03-11 LAB — CBC WITH DIFFERENTIAL (CANCER CENTER ONLY)
Abs Immature Granulocytes: 0.7 10*3/uL — ABNORMAL HIGH (ref 0.00–0.07)
Basophils Absolute: 0.1 10*3/uL (ref 0.0–0.1)
Basophils Relative: 0 %
Eosinophils Absolute: 0.2 10*3/uL (ref 0.0–0.5)
Eosinophils Relative: 1 %
HCT: 35.3 % — ABNORMAL LOW (ref 39.0–52.0)
Hemoglobin: 11.5 g/dL — ABNORMAL LOW (ref 13.0–17.0)
Immature Granulocytes: 4 %
Lymphocytes Relative: 14 %
Lymphs Abs: 2.6 10*3/uL (ref 0.7–4.0)
MCH: 30.1 pg (ref 26.0–34.0)
MCHC: 32.6 g/dL (ref 30.0–36.0)
MCV: 92.4 fL (ref 80.0–100.0)
Monocytes Absolute: 1.2 10*3/uL — ABNORMAL HIGH (ref 0.1–1.0)
Monocytes Relative: 6 %
Neutro Abs: 14.2 10*3/uL — ABNORMAL HIGH (ref 1.7–7.7)
Neutrophils Relative %: 75 %
Platelet Count: 177 10*3/uL (ref 150–400)
RBC: 3.82 MIL/uL — ABNORMAL LOW (ref 4.22–5.81)
RDW: 17.8 % — ABNORMAL HIGH (ref 11.5–15.5)
WBC Count: 18.9 10*3/uL — ABNORMAL HIGH (ref 4.0–10.5)
nRBC: 0 % (ref 0.0–0.2)

## 2020-03-11 MED ORDER — HEPARIN SOD (PORK) LOCK FLUSH 100 UNIT/ML IV SOLN
500.0000 [IU] | Freq: Once | INTRAVENOUS | Status: AC
  Administered 2020-03-11: 500 [IU]
  Filled 2020-03-11: qty 5

## 2020-03-11 MED ORDER — SODIUM CHLORIDE 0.9% FLUSH
10.0000 mL | Freq: Once | INTRAVENOUS | Status: AC
  Administered 2020-03-11: 10 mL
  Filled 2020-03-11: qty 10

## 2020-03-14 ENCOUNTER — Encounter: Payer: Self-pay | Admitting: Internal Medicine

## 2020-03-14 ENCOUNTER — Other Ambulatory Visit: Payer: Self-pay | Admitting: Physician Assistant

## 2020-03-14 ENCOUNTER — Telehealth: Payer: Self-pay | Admitting: Medical Oncology

## 2020-03-14 DIAGNOSIS — C7951 Secondary malignant neoplasm of bone: Secondary | ICD-10-CM

## 2020-03-14 DIAGNOSIS — C3491 Malignant neoplasm of unspecified part of right bronchus or lung: Secondary | ICD-10-CM

## 2020-03-14 MED ORDER — OXYCODONE-ACETAMINOPHEN 5-325 MG PO TABS: 1.0000 | ORAL_TABLET | Freq: Four times a day (QID) | ORAL | 0 refills | Status: DC | PRN

## 2020-03-14 NOTE — Telephone Encounter (Signed)
mychart message-Requests pain med refill.  -"Will you please refill my pain meds? Most days I do okay til the evening. I usually take one in the afternoon and one about 3:00 am because it gets to uncomfortable to sleep. Also, on the week that I get my booster shot I hurt SO much in the joints that for the next three days I take on about every 4 to 5 hours. That's after taking the Claritin. Claritin does not seem to help much. Hope you have a great day! "

## 2020-03-18 ENCOUNTER — Inpatient Hospital Stay: Payer: 59 | Attending: Internal Medicine

## 2020-03-18 ENCOUNTER — Inpatient Hospital Stay: Payer: 59

## 2020-03-18 ENCOUNTER — Ambulatory Visit (HOSPITAL_COMMUNITY)
Admission: RE | Admit: 2020-03-18 | Discharge: 2020-03-18 | Disposition: A | Discharge status: Home / Self Care | Payer: 59 | Source: Ambulatory Visit | Attending: Physician Assistant | Admitting: Physician Assistant

## 2020-03-18 ENCOUNTER — Other Ambulatory Visit: Payer: Self-pay

## 2020-03-18 ENCOUNTER — Encounter (HOSPITAL_COMMUNITY): Payer: Self-pay

## 2020-03-18 DIAGNOSIS — C3491 Malignant neoplasm of unspecified part of right bronchus or lung: Secondary | ICD-10-CM

## 2020-03-18 DIAGNOSIS — Z5189 Encounter for other specified aftercare: Secondary | ICD-10-CM | POA: Diagnosis not present

## 2020-03-18 DIAGNOSIS — Z95828 Presence of other vascular implants and grafts: Secondary | ICD-10-CM

## 2020-03-18 DIAGNOSIS — K219 Gastro-esophageal reflux disease without esophagitis: Secondary | ICD-10-CM | POA: Insufficient documentation

## 2020-03-18 DIAGNOSIS — Z79899 Other long term (current) drug therapy: Secondary | ICD-10-CM | POA: Insufficient documentation

## 2020-03-18 DIAGNOSIS — Z7984 Long term (current) use of oral hypoglycemic drugs: Secondary | ICD-10-CM | POA: Insufficient documentation

## 2020-03-18 DIAGNOSIS — Z5111 Encounter for antineoplastic chemotherapy: Secondary | ICD-10-CM | POA: Diagnosis present

## 2020-03-18 DIAGNOSIS — E119 Type 2 diabetes mellitus without complications: Secondary | ICD-10-CM | POA: Insufficient documentation

## 2020-03-18 DIAGNOSIS — G4733 Obstructive sleep apnea (adult) (pediatric): Secondary | ICD-10-CM | POA: Diagnosis not present

## 2020-03-18 DIAGNOSIS — Z7901 Long term (current) use of anticoagulants: Secondary | ICD-10-CM | POA: Diagnosis not present

## 2020-03-18 DIAGNOSIS — C3411 Malignant neoplasm of upper lobe, right bronchus or lung: Secondary | ICD-10-CM | POA: Insufficient documentation

## 2020-03-18 DIAGNOSIS — Z5112 Encounter for antineoplastic immunotherapy: Secondary | ICD-10-CM | POA: Diagnosis not present

## 2020-03-18 DIAGNOSIS — Z86711 Personal history of pulmonary embolism: Secondary | ICD-10-CM | POA: Insufficient documentation

## 2020-03-18 DIAGNOSIS — K589 Irritable bowel syndrome without diarrhea: Secondary | ICD-10-CM | POA: Diagnosis not present

## 2020-03-18 DIAGNOSIS — C7951 Secondary malignant neoplasm of bone: Secondary | ICD-10-CM | POA: Insufficient documentation

## 2020-03-18 DIAGNOSIS — E7800 Pure hypercholesterolemia, unspecified: Secondary | ICD-10-CM | POA: Insufficient documentation

## 2020-03-18 HISTORY — DX: Secondary malignant neoplasm of bone: C79.51

## 2020-03-18 LAB — CMP (CANCER CENTER ONLY)
ALT: 29 U/L (ref 0–44)
AST: 19 U/L (ref 15–41)
Albumin: 3.8 g/dL (ref 3.5–5.0)
Alkaline Phosphatase: 131 U/L — ABNORMAL HIGH (ref 38–126)
Anion gap: 11 (ref 5–15)
BUN: 8 mg/dL (ref 6–20)
CO2: 24 mmol/L (ref 22–32)
Calcium: 8.7 mg/dL — ABNORMAL LOW (ref 8.9–10.3)
Chloride: 103 mmol/L (ref 98–111)
Creatinine: 0.76 mg/dL (ref 0.61–1.24)
GFR, Est AFR Am: >60 mL/min (ref >60)
GFR, Estimated: >60 mL/min (ref >60)
Glucose, Bld: 128 mg/dL — ABNORMAL HIGH (ref 70–99)
Potassium: 4 mmol/L (ref 3.5–5.1)
Sodium: 138 mmol/L (ref 135–145)
Total Bilirubin: 0.3 mg/dL (ref 0.3–1.2)
Total Protein: 6.9 g/dL (ref 6.5–8.1)

## 2020-03-18 LAB — CBC WITH DIFFERENTIAL (CANCER CENTER ONLY)
Abs Immature Granulocytes: 0.2 10*3/uL — ABNORMAL HIGH (ref 0.00–0.07)
Basophils Absolute: 0.1 10*3/uL (ref 0.0–0.1)
Basophils Relative: 1 %
Eosinophils Absolute: 0 10*3/uL (ref 0.0–0.5)
Eosinophils Relative: 0 %
HCT: 35.4 % — ABNORMAL LOW (ref 39.0–52.0)
Hemoglobin: 11.6 g/dL — ABNORMAL LOW (ref 13.0–17.0)
Immature Granulocytes: 2 %
Lymphocytes Relative: 16 %
Lymphs Abs: 2 10*3/uL (ref 0.7–4.0)
MCH: 30.8 pg (ref 26.0–34.0)
MCHC: 32.8 g/dL (ref 30.0–36.0)
MCV: 93.9 fL (ref 80.0–100.0)
Monocytes Absolute: 0.8 10*3/uL (ref 0.1–1.0)
Monocytes Relative: 6 %
Neutro Abs: 9.9 10*3/uL — ABNORMAL HIGH (ref 1.7–7.7)
Neutrophils Relative %: 75 %
Platelet Count: 132 10*3/uL — ABNORMAL LOW (ref 150–400)
RBC: 3.77 MIL/uL — ABNORMAL LOW (ref 4.22–5.81)
RDW: 19.1 % — ABNORMAL HIGH (ref 11.5–15.5)
WBC Count: 13 10*3/uL — ABNORMAL HIGH (ref 4.0–10.5)
nRBC: 0 % (ref 0.0–0.2)

## 2020-03-18 MED ORDER — SODIUM CHLORIDE (PF) 0.9 % IJ SOLN
INTRAMUSCULAR | Status: AC
  Filled 2020-03-18: qty 50

## 2020-03-18 MED ORDER — HEPARIN SOD (PORK) LOCK FLUSH 100 UNIT/ML IV SOLN
500.0000 [IU] | Freq: Once | INTRAVENOUS | Status: AC
  Administered 2020-03-18: 500 [IU] via INTRAVENOUS

## 2020-03-18 MED ORDER — SODIUM CHLORIDE 0.9% FLUSH
10.0000 mL | Freq: Once | INTRAVENOUS | Status: AC
  Administered 2020-03-18: 10 mL
  Filled 2020-03-18: qty 10

## 2020-03-18 MED ORDER — HEPARIN SOD (PORK) LOCK FLUSH 100 UNIT/ML IV SOLN
INTRAVENOUS | Status: AC
  Filled 2020-03-18: qty 5

## 2020-03-18 MED ORDER — IOHEXOL 300 MG/ML  SOLN
100.0000 mL | Freq: Once | INTRAMUSCULAR | Status: AC | PRN
  Administered 2020-03-18: 100 mL via INTRAVENOUS

## 2020-03-18 MED ORDER — HEPARIN SOD (PORK) LOCK FLUSH 100 UNIT/ML IV SOLN
500.0000 [IU] | Freq: Once | INTRAVENOUS | Status: DC
  Filled 2020-03-18: qty 5

## 2020-03-19 NOTE — Progress Notes (Signed)
Pharmacist Chemotherapy Monitoring - Follow Up Assessment    I verify that I have reviewed each item in the below checklist:  . Regimen for the patient is scheduled for the appropriate day and plan matches scheduled date. [name] Appropriate non-routine labs are ordered dependent on drug ordered. . If applicable, additional medications reviewed and ordered per protocol based on lifetime cumulative doses and/or treatment regimen.   Plan for follow-up and/or issues identified: No . I-vent associated with next due treatment: No . MD and/or nursing notified: No  Dhriti Fales D 03/19/2020 4:22 PM

## 2020-03-22 ENCOUNTER — Ambulatory Visit: Payer: 59 | Attending: Internal Medicine

## 2020-03-22 DIAGNOSIS — Z23 Encounter for immunization: Secondary | ICD-10-CM

## 2020-03-22 NOTE — Progress Notes (Signed)
   Covid-19 Vaccination Clinic  Name:  Mike [name]    MRN: [id number] DOB: July 29, 1966  03/22/2020  [name] was observed post Covid-19 immunization for 15 minutes without incident. He was provided with Vaccine Information Sheet and instruction to access the V-Safe system.   [name] was instructed to call 911 with any severe reactions post vaccine: [name] Difficulty breathing  . Swelling of face and throat  . A fast heartbeat  . A bad rash all over body  . Dizziness and weakness   Immunizations Administered    Name Date Dose VIS Date Route   Pfizer COVID-19 Vaccine 03/22/2020 11:08 AM 0.3 mL 12/12/2018 Intramuscular   Manufacturer: [employer]   Lot: [id number]   [place]: [id number]

## 2020-03-25 ENCOUNTER — Encounter: Payer: Self-pay | Admitting: Family Medicine

## 2020-03-25 ENCOUNTER — Inpatient Hospital Stay: Payer: 59

## 2020-03-25 ENCOUNTER — Encounter: Payer: Self-pay | Admitting: Internal Medicine

## 2020-03-25 ENCOUNTER — Telehealth: Payer: Self-pay | Admitting: Family Medicine

## 2020-03-25 ENCOUNTER — Inpatient Hospital Stay: Payer: 59 | Admitting: Internal Medicine

## 2020-03-25 ENCOUNTER — Telehealth: Payer: Self-pay | Admitting: Medical Oncology

## 2020-03-25 ENCOUNTER — Other Ambulatory Visit: Payer: Self-pay | Admitting: Physician Assistant

## 2020-03-25 DIAGNOSIS — C3491 Malignant neoplasm of unspecified part of right bronchus or lung: Secondary | ICD-10-CM

## 2020-03-25 DIAGNOSIS — C7951 Secondary malignant neoplasm of bone: Secondary | ICD-10-CM

## 2020-03-25 MED ORDER — DIAZEPAM 5 MG PO TABS: 5.0000 mg | ORAL_TABLET | Freq: Two times a day (BID) | ORAL | 0 refills | Status: DC | PRN

## 2020-03-25 MED ORDER — OXYCODONE-ACETAMINOPHEN 5-325 MG PO TABS: 1.0000 | ORAL_TABLET | Freq: Four times a day (QID) | ORAL | 0 refills | Status: DC | PRN

## 2020-03-25 NOTE — Telephone Encounter (Signed)
Spoke with patient.  Son suddenly passed away yesterday 56 yo ?MI.  This is second son that has died (first one died 41 yo).  Reasonable to continue valium 5mg  BID PRN anxiety or muscle spasm. Reviewed habit forming nature of med. Update with effect.

## 2020-03-25 NOTE — Telephone Encounter (Signed)
Patient called  He stated his son passed away yesterday and he would like to know if there is anything that could be sent into his pharmacy that would help calm his nerves. Patient stated he is having a hard time right now and anything would help   Please advise

## 2020-03-25 NOTE — Telephone Encounter (Signed)
Oxycodone refill requested.

## 2020-03-26 ENCOUNTER — Telehealth: Payer: Self-pay | Admitting: Internal Medicine

## 2020-03-26 NOTE — Telephone Encounter (Signed)
R/s appt per 6/8 sch message - unable reach pt - left message with appt date and time

## 2020-03-27 ENCOUNTER — Inpatient Hospital Stay: Payer: 59

## 2020-03-31 ENCOUNTER — Telehealth: Payer: Self-pay | Admitting: Family Medicine

## 2020-03-31 NOTE — Telephone Encounter (Signed)
Received request from [employer] benefits claim. Forms filled out and placed in my out box.

## 2020-04-01 ENCOUNTER — Inpatient Hospital Stay: Payer: 59

## 2020-04-01 NOTE — Progress Notes (Signed)
[facility] OFFICE PROGRESS NOTE  [name], MD [place] [address]  DIAGNOSIS: Stage IV (T2b, N3, M1C) non-small cell lung cancer, squamous cell carcinoma presented with right upper lobe lung mass in addition to mediastinal and right supraclavicular lymphadenopathy as well as multiple metastatic bone lesions diagnosed in March 2021.  PRIOR THERAPY: Palliative radiotherapy to the painful metastatic bone lesions under the care of Dr. Sondra Come. Last dose 01/14/20   CURRENT THERAPY: Palliative systemic chemotherapy withcarboplatin for AUC of 5, paclitaxel 175 NG/M2 and Keytruda 200 mg IV every 3 weeks with Neulasta support.First dose 01/03/20. Status post 3 cycles.  INTERVAL HISTORY: MYAN SUIT 54 y.o. male returns to the clinic today for a follow-up visit accompanied by his wife. Today without any concerning complaints except for some persistent hip pain where he received radiation due to the metastatic bone lesion.  The patient's son passed away unexpectedly recently. It looks like some of his medications for depression/anxiety have been adjusted by his PCP recently for this.   Regarding his treatment, the patient tolerated his last cycle treatment fairly well without any concerning adverse side effects except he does experience back pain about 4 days after his neulasta injection which has subsided at this time. During this, he takes Claritin without an appreciable difference. He also has a prescription for percocet.  He denies any recent fever, chills, or weight loss.  He reportedly experiences night sweats the night following his treatment.  He reports his baseline dyspnea on exertion. He believes his cough has improved. He denies any chest pain or hemoptysis.  He denies any nausea, vomiting, diarrhea, or constipation. The patient denies any headache or visual changes.  The patient recently had a restaging CT scan performed.  The patient is here  today for evaluation and to review his scan results before starting cycle #4.    MEDICAL HISTORY: Past Medical History:  Diagnosis Date  . Arthritis    neck  . Cancer, metastatic to bone ([employer]) dx'd 10/2019  . Diabetes ([place])   . Fatty liver 12/05/2019   By [place] 11/2019  . GERD (gastroesophageal reflux disease)   . 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 High cholesterol   . IBS (irritable bowel syndrome)   . lung ca dx'd 10/2019   lung stage 4   . OSA (obstructive sleep apnea) 09/15/2019   Sleep study 12/2017 - severe OSA with AHI 65.6, desat to 76% rec CPAP  . Severe obesity (BMI 35.0-39.9) with comorbidity ([place]) 09/04/2019    ALLERGIES:  has No Known Allergies.  MEDICATIONS:  Current Outpatient Medications  Medication Sig Dispense Refill  . albuterol (PROVENTIL) (2.5 MG/3ML) 0.083% nebulizer solution Take 3 mLs (2.5 mg total) by nebulization every 6 (six) hours as needed for wheezing or shortness of breath. 75 mL 12  . apixaban (ELIQUIS) 5 MG TABS tablet Take 2 tabs (10mg ) twice daily for 7 days. On 01/29/20, change to 1 tab (5mg ) twice a day. 60 tablet 3  . atorvastatin (LIPITOR) 20 MG tablet Take 1 tablet (20 mg total) by mouth daily. (Patient taking differently: Take 10 mg by mouth in the morning and at bedtime. ) 90 tablet 3  . diazepam (VALIUM) 5 MG tablet Take 1 tablet (5 mg total) by mouth every 12 (twelve) hours as needed for anxiety or muscle spasms. 30 tablet 0  . lidocaine-prilocaine (EMLA) cream Apply to the Port-A-Cath site 30 minutes before treatment  30 g 0  . metFORMIN (GLUCOPHAGE) 1000 MG tablet Take 1 tablet (1,000 mg total) by mouth 2 (two) times daily with a meal. 180 tablet 3  . omeprazole (PRILOSEC) 40 MG capsule Take 1 capsule (40 mg total) by mouth daily. For 3 weeks then as needed 30 capsule 3  . oxyCODONE-acetaminophen (PERCOCET/ROXICET) 5-325 MG tablet Take 1 tablet by mouth every 6 (six) hours as needed for severe pain. 30 tablet 0  . phenazopyridine  (PYRIDIUM) 200 MG tablet Take 1 tablet (200 mg total) by mouth 3 (three) times daily as needed for pain. (Patient not taking: Reported on 03/05/2020) 21 tablet 0  . sildenafil (REVATIO) 20 MG tablet Take 20-100 mg by mouth daily as needed (ED).      No current facility-administered medications for this visit.    SURGICAL HISTORY:  Past Surgical History:  Procedure Laterality Date  . BIOPSY  12/18/2019   Procedure: BIOPSY;  Surgeon: Garner Nash, DO;  Location: [facility];  Service: Pulmonary;;  . BRONCHIAL BRUSHINGS  12/18/2019   Procedure: BRONCHIAL BRUSHINGS;  Surgeon: Garner Nash, DO;  Location: [facility];  Service: Pulmonary;;  . BRONCHIAL WASHINGS  12/18/2019   Procedure: BRONCHIAL WASHINGS;  Surgeon: Garner Nash, DO;  Location: [facility];  Service: Pulmonary;;  . COLONOSCOPY    . CYSTECTOMY     knee- left knee cyst  . ENDOBRONCHIAL ULTRASOUND  12/18/2019   Procedure: ENDOBRONCHIAL ULTRASOUND;  Surgeon: Garner Nash, DO;  Location: [facility];  Service: Pulmonary;;  . FINE NEEDLE ASPIRATION  12/18/2019   Procedure: FINE NEEDLE ASPIRATION;  Surgeon: Garner Nash, DO;  Location: [facility];  Service: Pulmonary;;  . IR IMAGING GUIDED PORT INSERTION  12/27/2019  . PILONIDAL CYST EXCISION    . VIDEO BRONCHOSCOPY WITH ENDOBRONCHIAL ULTRASOUND Right 12/18/2019   Procedure: VIDEO BRONCHOSCOPY;  Surgeon: Garner Nash, DO;  Location: [facility];  Service: Pulmonary;  Laterality: Right;    REVIEW OF SYSTEMS:   Review of Systems  Constitutional: Negative for appetite change, chills, fatigue, fever and unexpected weight change.  HENT:   Negative for mouth sores, nosebleeds, sore throat and trouble swallowing.   Eyes: Negative for eye problems and icterus.  Respiratory:Positive for baseline dyspnea on exertion.Negative for hemoptysis, cough, and wheezing.   Gastrointestinal: Negative for abdominal pain, constipation, diarrhea, nausea and vomiting.   Genitourinary: Negative for bladder incontinence, difficulty urinating, dysuria, frequency and hematuria.   Musculoskeletal:Positive for right hip pain. Negative for back pain, gait problem, neck pain and neck stiffness.  Skin: Negative for itching and rash.  Neurological: Negative for dizziness, extremity weakness, gait problem, headaches, light-headedness and seizures.  Hematological: Negative for adenopathy. Does not bruise/bleed easily.  Psychiatric/Behavioral: Negative for confusion, depression and sleep disturbance. The patient is not nervous/anxious.    PHYSICAL EXAMINATION:  Blood pressure 113/80, pulse 88, temperature (!) 97.5 F (36.4 C), temperature source Temporal, resp. rate 18, height 5\' 11"  (1.803 m), weight 270 lb 6.4 oz (122.7 kg), SpO2 98 %.  ECOG PERFORMANCE STATUS: 1 - Symptomatic but completely ambulatory  Physical Exam  Constitutional: Oriented to person, place, and time and well-developed, well-nourished, and in no distress.  HENT:  Head: Normocephalic and atraumatic.  Mouth/Throat: Oropharynx is clear and moist. No oropharyngeal exudate.  Eyes: Conjunctivae are normal. Right eye exhibits no discharge. Left eye exhibits no discharge. No scleral icterus.  Neck: Normal range of motion. Neck supple.  Cardiovascular: Normal rate, regular rhythm, normal heart sounds and intact distal pulses.  Pulmonary/Chest: Effort normal and breath sounds normal. No respiratory distress. No wheezes. No rales.  Abdominal: Soft. Bowel sounds are normal. Exhibits no distension and no mass. There is no tenderness.  Musculoskeletal: Normal range of motion. Exhibits no edema.  Lymphadenopathy:    No cervical adenopathy.  Neurological: Alert and oriented to person, place, and time. Exhibits normal muscle tone. Gait normal. Coordination normal.  Skin: Skin is warm and dry. No rash noted. Not diaphoretic. No erythema. No pallor.  Psychiatric: Mood, memory and judgment normal.  Vitals  reviewed.  LABORATORY DATA: Lab Results  Component Value Date   WBC 8.3 04/02/2020   HGB 12.2 (L) 04/02/2020   HCT 37.8 (L) 04/02/2020   MCV 95.9 04/02/2020   PLT 192 04/02/2020      Chemistry      Component Value Date/Time   NA 140 04/02/2020 0927   K 4.2 04/02/2020 0927   CL 104 04/02/2020 0927   CO2 28 04/02/2020 0927   BUN 10 04/02/2020 0927   CREATININE 0.73 04/02/2020 0927      Component Value Date/Time   CALCIUM 8.9 04/02/2020 0927   ALKPHOS 75 04/02/2020 0927   AST 24 04/02/2020 0927   ALT 34 04/02/2020 0927   BILITOT 0.4 04/02/2020 0927       RADIOGRAPHIC STUDIES:  CT Chest W Contrast  Result Date: 03/18/2020 CLINICAL DATA:  Squamous cell carcinoma of the lung.  Restaging. EXAM: CT CHEST, ABDOMEN, AND PELVIS WITH CONTRAST TECHNIQUE: Multidetector CT imaging of the chest, abdomen and pelvis was performed following the standard protocol during bolus administration of intravenous contrast. CONTRAST:  117mL OMNIPAQUE IOHEXOL 300 MG/ML  SOLN COMPARISON:  CTA chest 01/20/2020.  Chest CT 12/12/2019. FINDINGS: CT CHEST FINDINGS Cardiovascular: Residual nonocclusive thrombus still visible in the distal right main pulmonary artery extending into the interlobar pulmonary artery. There is residual nonacute, nonocclusive thrombus in the left lower lobe pulmonary artery. The heart size is normal. No substantial pericardial effusion. Coronary artery calcification is evident. Left Port-A-Cath tip is positioned in the distal SVC. Mediastinum/Nodes: Right paratracheal lymphadenopathy has decreased in the interval when comparing to recent CTA chest of 01/20/2020. 10 mm short axis right paratracheal node seen on image 24/2 today was 20 mm when I remeasure it in a similar fashion on the prior study. 10 mm short axis right hilar node on 32/2 today was 21 mm when remeasured on the prior study. There is no left hilar adenopathy today. 12 mm subcarinal node on today's study is decreased from 14 mm  previously. The esophagus has normal imaging features. There is no axillary lymphadenopathy. Lungs/Pleura: Right parahilar mass has decreased in the interval measuring 2.1 x 1.0 cm on image 58/6 today compared to 4.6 x 3.5 cm on the 12/12/2019 exam. This has also decrease since 01/20/2020 when it measured approximately 3.4 x 2.2 cm. No new suspicious pulmonary nodule or mass. No pleural effusion. Musculoskeletal: The posterior right eighth rib lesion seen on previous PET-CT has markedly decreased in the interval with no residual soft tissue component visible on today's study. The lower sternal lesion measures 1.8 cm today compared to 1.7 cm on the previous PET-CT. CT ABDOMEN PELVIS FINDINGS Hepatobiliary: The liver shows diffusely decreased attenuation suggesting fat deposition. No suspicious focal abnormality within the liver parenchyma. There is no evidence for gallstones, gallbladder wall thickening, or pericholecystic fluid. No intrahepatic or extrahepatic biliary dilation. Pancreas: No focal mass lesion. No dilatation of the main duct. No intraparenchymal cyst. No peripancreatic edema. Spleen:  No splenomegaly. No focal mass lesion. Adrenals/Urinary Tract: No adrenal nodule or mass. 2 mm nonobstructing stone noted upper pole left kidney. Right kidney unremarkable. No evidence for hydroureter. The urinary bladder appears normal for the degree of distention. Stomach/Bowel: Stomach is unremarkable. No gastric wall thickening. No evidence of outlet obstruction. Duodenum is normally positioned as is the ligament of Treitz. No small bowel wall thickening. No small bowel dilatation. The terminal ileum is normal. The appendix is normal. No gross colonic mass. No colonic wall thickening. Vascular/Lymphatic: There is abdominal aortic atherosclerosis without aneurysm. There is no gastrohepatic or hepatoduodenal ligament lymphadenopathy. No retroperitoneal or mesenteric lymphadenopathy. No pelvic sidewall lymphadenopathy.  Reproductive: The prostate gland and seminal vesicles are unremarkable. Other: No intraperitoneal free fluid. Musculoskeletal: Lytic lesion in the posterior right acetabulum is 2.8 cm today (123/2) which compares to 3.0 cm when remeasured in a similar fashion on the previous PET-CT of 12/20/2019. IMPRESSION: 1. Interval decrease in size of the right upper lobe mass and mediastinal/right hilar lymphadenopathy. 2. The hypermetabolic bone metastases seen in the sternum, right eighth rib, and acetabulum on prior PET-CT are stable to decreased in size in the interval since 12/20/2019. 3. No evidence for new or progressive findings in the chest, abdomen, or pelvis. 4. Substantial interval decrease in burden of pulmonary embolus on today's study compared to CTA 01/20/2020 although there is residual nonobstructive embolic disease in the right main/interlobar pulmonary arteries and left lower lobe pulmonary artery. 5. Nonobstructing left renal stone. 6. Hepatic steatosis. 7. Aortic Atherosclerosis (ICD10-I70.0). Electronically Signed   By: Misty Stanley M.D.   On: 03/18/2020 14:49   CT Abdomen Pelvis W Contrast  Result Date: 03/18/2020 CLINICAL DATA:  Squamous cell carcinoma of the lung.  Restaging. EXAM: CT CHEST, ABDOMEN, AND PELVIS WITH CONTRAST TECHNIQUE: Multidetector CT imaging of the chest, abdomen and pelvis was performed following the standard protocol during bolus administration of intravenous contrast. CONTRAST:  185mL OMNIPAQUE IOHEXOL 300 MG/ML  SOLN COMPARISON:  CTA chest 01/20/2020.  Chest CT 12/12/2019. FINDINGS: CT CHEST FINDINGS Cardiovascular: Residual nonocclusive thrombus still visible in the distal right main pulmonary artery extending into the interlobar pulmonary artery. There is residual nonacute, nonocclusive thrombus in the left lower lobe pulmonary artery. The heart size is normal. No substantial pericardial effusion. Coronary artery calcification is evident. Left Port-A-Cath tip is positioned  in the distal SVC. Mediastinum/Nodes: Right paratracheal lymphadenopathy has decreased in the interval when comparing to recent CTA chest of 01/20/2020. 10 mm short axis right paratracheal node seen on image 24/2 today was 20 mm when I remeasure it in a similar fashion on the prior study. 10 mm short axis right hilar node on 32/2 today was 21 mm when remeasured on the prior study. There is no left hilar adenopathy today. 12 mm subcarinal node on today's study is decreased from 14 mm previously. The esophagus has normal imaging features. There is no axillary lymphadenopathy. Lungs/Pleura: Right parahilar mass has decreased in the interval measuring 2.1 x 1.0 cm on image 58/6 today compared to 4.6 x 3.5 cm on the 12/12/2019 exam. This has also decrease since 01/20/2020 when it measured approximately 3.4 x 2.2 cm. No new suspicious pulmonary nodule or mass. No pleural effusion. Musculoskeletal: The posterior right eighth rib lesion seen on previous PET-CT has markedly decreased in the interval with no residual soft tissue component visible on today's study. The lower sternal lesion measures 1.8 cm today compared to 1.7 cm on the previous PET-CT. CT ABDOMEN PELVIS FINDINGS  Hepatobiliary: The liver shows diffusely decreased attenuation suggesting fat deposition. No suspicious focal abnormality within the liver parenchyma. There is no evidence for gallstones, gallbladder wall thickening, or pericholecystic fluid. No intrahepatic or extrahepatic biliary dilation. Pancreas: No focal mass lesion. No dilatation of the main duct. No intraparenchymal cyst. No peripancreatic edema. Spleen: No splenomegaly. No focal mass lesion. Adrenals/Urinary Tract: No adrenal nodule or mass. 2 mm nonobstructing stone noted upper pole left kidney. Right kidney unremarkable. No evidence for hydroureter. The urinary bladder appears normal for the degree of distention. Stomach/Bowel: Stomach is unremarkable. No gastric wall thickening. No  evidence of outlet obstruction. Duodenum is normally positioned as is the ligament of Treitz. No small bowel wall thickening. No small bowel dilatation. The terminal ileum is normal. The appendix is normal. No gross colonic mass. No colonic wall thickening. Vascular/Lymphatic: There is abdominal aortic atherosclerosis without aneurysm. There is no gastrohepatic or hepatoduodenal ligament lymphadenopathy. No retroperitoneal or mesenteric lymphadenopathy. No pelvic sidewall lymphadenopathy. Reproductive: The prostate gland and seminal vesicles are unremarkable. Other: No intraperitoneal free fluid. Musculoskeletal: Lytic lesion in the posterior right acetabulum is 2.8 cm today (123/2) which compares to 3.0 cm when remeasured in a similar fashion on the previous PET-CT of 12/20/2019. IMPRESSION: 1. Interval decrease in size of the right upper lobe mass and mediastinal/right hilar lymphadenopathy. 2. The hypermetabolic bone metastases seen in the sternum, right eighth rib, and acetabulum on prior PET-CT are stable to decreased in size in the interval since 12/20/2019. 3. No evidence for new or progressive findings in the chest, abdomen, or pelvis. 4. Substantial interval decrease in burden of pulmonary embolus on today's study compared to CTA 01/20/2020 although there is residual nonobstructive embolic disease in the right main/interlobar pulmonary arteries and left lower lobe pulmonary artery. 5. Nonobstructing left renal stone. 6. Hepatic steatosis. 7. Aortic Atherosclerosis (ICD10-I70.0). Electronically Signed   By: Misty Stanley M.D.   On: 03/18/2020 14:49     ASSESSMENT/PLAN:  This is a very pleasant 83 year oldCaucasianmale recently diagnosed with a stage IV (T2b, N3, M1C) non-small cell lung cancer, squamous cell carcinoma presented with right upper lobe lung mass in addition to mediastinal and right supraclavicular lymphadenopathy as well as multiple metastatic bone lesions diagnosed in March  2021.  The patientcompletedpalliative radiotherapy to the painful bone lesions under the care of Dr.Kinard.Last treatment on 01/14/20.  The patient is currently undergoing palliative systemic chemotherapy with carboplatin for an AUC of 5, paclitaxel 175mg /m, and Keytruda 200 mg IV every 3 weeks with Neulasta support. He is status post 3 cycles.   The patient recently had a restaging CT scan performed.  Dr. Julien Nordmann personally and independently reviewed the scan and discussed the results with the patient today.  The scan showed an interval positive response to treatment.  Dr. Julien Nordmann recommends that the patient continue on the same treatment at the same dose.  He will receive cycle #4 today as scheduled.  We will see him back for follow-up visit in 3 weeks for evaluation before starting cycle #5.  He will continue taking eliquis for his recent pulmonary embolism.  The patient was advised to call immediately if he has any concerning symptoms in the interval. The patient voices understanding of current disease status and treatment options and is in agreement with the current care plan. All questions were answered. The patient knows to call the clinic with any problems, questions or concerns. We can certainly see the patient much sooner if necessary  Orders Placed This  Encounter  Procedures  . CBC with Differential (Cancer Center Only)    Standing Status:   Standing    Number of Occurrences:   2    Standing Expiration Date:   04/02/2021  . CMP ([facility] only)    Standing Status:   Standing    Number of Occurrences:   2    Standing Expiration Date:   04/02/2021     Tobe Sos Alanya Vukelich, PA-C 04/02/20  ADDENDUM: Hematology/Oncology Attending: I had a face-to-face encounter with the patient today.  I recommended his care plan.  This is a very pleasant 54 years old white male with stage IV non-small cell lung cancer, squamous cell carcinoma diagnosed in March 2021.  The  patient is currently undergoing systemic chemotherapy with carboplatin, paclitaxel and Keytruda status post 3 cycles.  He has been tolerating the treatment well except for the aching pain after the Neulasta injection.  Unfortunately he lost his second son less than 2 weeks ago. The patient had repeat CT scan of the chest, abdomen pelvis performed recently.  I personally and independently reviewed the scans and discussed the results with the patient and his wife. His scan showed no concerning findings for disease progression and he has some partial response. I recommended for the patient to continue his treatment as planned and he will proceed with cycle #4 today. He will come back for follow-up visit in 3 weeks for evaluation before the next cycle of his treatment. Starting from cycle #5 he will be treated with maintenance treatment with single agent Keytruda. The patient was advised to call immediately if he has any concerning symptoms in the interval.  Disclaimer: This note was dictated with voice recognition software. Similar sounding words can inadvertently be transcribed and may be missed upon review. Eilleen Kempf, MD 04/02/20

## 2020-04-02 ENCOUNTER — Other Ambulatory Visit: Payer: Self-pay

## 2020-04-02 ENCOUNTER — Inpatient Hospital Stay (HOSPITAL_BASED_OUTPATIENT_CLINIC_OR_DEPARTMENT_OTHER): Payer: 59 | Admitting: Physician Assistant

## 2020-04-02 ENCOUNTER — Inpatient Hospital Stay: Payer: 59

## 2020-04-02 ENCOUNTER — Encounter: Payer: Self-pay | Admitting: Physician Assistant

## 2020-04-02 VITALS — BP 113/80 | HR 88 | Temp 97.5°F | Resp 18 | Ht 71.0 in | Wt 270.4 lb

## 2020-04-02 DIAGNOSIS — C3491 Malignant neoplasm of unspecified part of right bronchus or lung: Secondary | ICD-10-CM

## 2020-04-02 DIAGNOSIS — Z5111 Encounter for antineoplastic chemotherapy: Secondary | ICD-10-CM | POA: Diagnosis not present

## 2020-04-02 DIAGNOSIS — Z5112 Encounter for antineoplastic immunotherapy: Secondary | ICD-10-CM

## 2020-04-02 DIAGNOSIS — Z95828 Presence of other vascular implants and grafts: Secondary | ICD-10-CM

## 2020-04-02 LAB — CMP (CANCER CENTER ONLY)
ALT: 34 U/L (ref 0–44)
AST: 24 U/L (ref 15–41)
Albumin: 3.7 g/dL (ref 3.5–5.0)
Alkaline Phosphatase: 75 U/L (ref 38–126)
Anion gap: 8 (ref 5–15)
BUN: 10 mg/dL (ref 6–20)
CO2: 28 mmol/L (ref 22–32)
Calcium: 8.9 mg/dL (ref 8.9–10.3)
Chloride: 104 mmol/L (ref 98–111)
Creatinine: 0.73 mg/dL (ref 0.61–1.24)
GFR, Est AFR Am: >60 mL/min (ref >60)
GFR, Estimated: >60 mL/min (ref >60)
Glucose, Bld: 122 mg/dL — ABNORMAL HIGH (ref 70–99)
Potassium: 4.2 mmol/L (ref 3.5–5.1)
Sodium: 140 mmol/L (ref 135–145)
Total Bilirubin: 0.4 mg/dL (ref 0.3–1.2)
Total Protein: 6.8 g/dL (ref 6.5–8.1)

## 2020-04-02 LAB — CBC WITH DIFFERENTIAL (CANCER CENTER ONLY)
Abs Immature Granulocytes: 0.02 10*3/uL (ref 0.00–0.07)
Basophils Absolute: 0.1 10*3/uL (ref 0.0–0.1)
Basophils Relative: 1 %
Eosinophils Absolute: 0.2 10*3/uL (ref 0.0–0.5)
Eosinophils Relative: 3 %
HCT: 37.8 % — ABNORMAL LOW (ref 39.0–52.0)
Hemoglobin: 12.2 g/dL — ABNORMAL LOW (ref 13.0–17.0)
Immature Granulocytes: 0 %
Lymphocytes Relative: 21 %
Lymphs Abs: 1.7 10*3/uL (ref 0.7–4.0)
MCH: 31 pg (ref 26.0–34.0)
MCHC: 32.3 g/dL (ref 30.0–36.0)
MCV: 95.9 fL (ref 80.0–100.0)
Monocytes Absolute: 0.7 10*3/uL (ref 0.1–1.0)
Monocytes Relative: 9 %
Neutro Abs: 5.5 10*3/uL (ref 1.7–7.7)
Neutrophils Relative %: 66 %
Platelet Count: 192 10*3/uL (ref 150–400)
RBC: 3.94 MIL/uL — ABNORMAL LOW (ref 4.22–5.81)
RDW: 18.1 % — ABNORMAL HIGH (ref 11.5–15.5)
WBC Count: 8.3 10*3/uL (ref 4.0–10.5)
nRBC: 0 % (ref 0.0–0.2)

## 2020-04-02 LAB — TSH: TSH: 3.589 u[IU]/mL (ref 0.320–4.118)

## 2020-04-02 MED ORDER — SODIUM CHLORIDE 0.9 % IV SOLN
Freq: Once | INTRAVENOUS | Status: AC
  Filled 2020-04-02: qty 250

## 2020-04-02 MED ORDER — PALONOSETRON HCL INJECTION 0.25 MG/5ML
INTRAVENOUS | Status: AC
  Filled 2020-04-02: qty 5

## 2020-04-02 MED ORDER — SODIUM CHLORIDE 0.9% FLUSH
10.0000 mL | Freq: Once | INTRAVENOUS | Status: AC
  Administered 2020-04-02: 10 mL
  Filled 2020-04-02: qty 10

## 2020-04-02 MED ORDER — SODIUM CHLORIDE 0.9 % IV SOLN
175.0000 mg/m2 | Freq: Once | INTRAVENOUS | Status: AC
  Administered 2020-04-02: 438 mg via INTRAVENOUS
  Filled 2020-04-02: qty 73

## 2020-04-02 MED ORDER — SODIUM CHLORIDE 0.9 % IV SOLN
750.0000 mg | Freq: Once | INTRAVENOUS | Status: AC
  Administered 2020-04-02: 750 mg via INTRAVENOUS
  Filled 2020-04-02: qty 75

## 2020-04-02 MED ORDER — HEPARIN SOD (PORK) LOCK FLUSH 100 UNIT/ML IV SOLN
500.0000 [IU] | Freq: Once | INTRAVENOUS | Status: AC | PRN
  Administered 2020-04-02: 500 [IU]
  Filled 2020-04-02: qty 5

## 2020-04-02 MED ORDER — SODIUM CHLORIDE 0.9 % IV SOLN
10.0000 mg | Freq: Once | INTRAVENOUS | Status: AC
  Administered 2020-04-02: 10 mg via INTRAVENOUS
  Filled 2020-04-02: qty 10

## 2020-04-02 MED ORDER — SODIUM CHLORIDE 0.9 % IV SOLN
150.0000 mg | Freq: Once | INTRAVENOUS | Status: AC
  Administered 2020-04-02: 150 mg via INTRAVENOUS
  Filled 2020-04-02: qty 150

## 2020-04-02 MED ORDER — SODIUM CHLORIDE 0.9% FLUSH
10.0000 mL | INTRAVENOUS | Status: DC | PRN
  Administered 2020-04-02: 10 mL
  Filled 2020-04-02: qty 10

## 2020-04-02 MED ORDER — DIPHENHYDRAMINE HCL 50 MG/ML IJ SOLN
50.0000 mg | Freq: Once | INTRAMUSCULAR | Status: AC
  Administered 2020-04-02: 50 mg via INTRAVENOUS

## 2020-04-02 MED ORDER — PALONOSETRON HCL INJECTION 0.25 MG/5ML
0.2500 mg | Freq: Once | INTRAVENOUS | Status: AC
  Administered 2020-04-02: 0.25 mg via INTRAVENOUS

## 2020-04-02 MED ORDER — FAMOTIDINE IN NACL 20-0.9 MG/50ML-% IV SOLN
20.0000 mg | Freq: Once | INTRAVENOUS | Status: AC
  Administered 2020-04-02: 20 mg via INTRAVENOUS

## 2020-04-02 MED ORDER — DIPHENHYDRAMINE HCL 50 MG/ML IJ SOLN
INTRAMUSCULAR | Status: AC
  Filled 2020-04-02: qty 1

## 2020-04-02 MED ORDER — SODIUM CHLORIDE 0.9 % IV SOLN
200.0000 mg | Freq: Once | INTRAVENOUS | Status: AC
  Administered 2020-04-02: 200 mg via INTRAVENOUS
  Filled 2020-04-02: qty 8

## 2020-04-02 MED ORDER — FAMOTIDINE IN NACL 20-0.9 MG/50ML-% IV SOLN
INTRAVENOUS | Status: AC
  Filled 2020-04-02: qty 50

## 2020-04-02 NOTE — Patient Instructions (Signed)
[facility] Discharge Instructions for Patients Receiving Chemotherapy  Today you received the following chemotherapy agents: Keytruda, Taxol, Carboplatin  To help prevent nausea and vomiting after your treatment, we encourage you to take your nausea medication as directed.   If you develop nausea and vomiting that is not controlled by your nausea medication, call the clinic.   BELOW ARE SYMPTOMS THAT SHOULD BE REPORTED IMMEDIATELY:  *FEVER GREATER THAN 100.5 F  *CHILLS WITH OR WITHOUT FEVER  NAUSEA AND VOMITING THAT IS NOT CONTROLLED WITH YOUR NAUSEA MEDICATION  *UNUSUAL SHORTNESS OF BREATH  *UNUSUAL BRUISING OR BLEEDING  TENDERNESS IN MOUTH AND THROAT WITH OR WITHOUT PRESENCE OF ULCERS  *URINARY PROBLEMS  *BOWEL PROBLEMS  UNUSUAL RASH Items with * indicate a potential emergency and should be followed up as soon as possible.  Feel free to call the clinic should you have any questions or concerns. The clinic phone number is [phone].  Please show the CHEMO ALERT CARD at check-in to the Emergency Department and triage nurse.   

## 2020-04-02 NOTE — Telephone Encounter (Signed)
lvm letting pt know ppw has been completed. There is a copy in the yellow folders for him to pick up.

## 2020-04-04 ENCOUNTER — Inpatient Hospital Stay: Payer: 59

## 2020-04-04 ENCOUNTER — Telehealth: Payer: Self-pay | Admitting: Physician Assistant

## 2020-04-04 ENCOUNTER — Other Ambulatory Visit: Payer: Self-pay

## 2020-04-04 VITALS — BP 126/86 | HR 80 | Temp 98.2°F | Resp 18

## 2020-04-04 DIAGNOSIS — Z5112 Encounter for antineoplastic immunotherapy: Secondary | ICD-10-CM | POA: Diagnosis not present

## 2020-04-04 DIAGNOSIS — C3491 Malignant neoplasm of unspecified part of right bronchus or lung: Secondary | ICD-10-CM

## 2020-04-04 MED ORDER — PEGFILGRASTIM INJECTION 6 MG/0.6ML ~~LOC~~
PREFILLED_SYRINGE | SUBCUTANEOUS | Status: AC
  Filled 2020-04-04: qty 0.6

## 2020-04-04 MED ORDER — PEGFILGRASTIM INJECTION 6 MG/0.6ML ~~LOC~~
6.0000 mg | PREFILLED_SYRINGE | Freq: Once | SUBCUTANEOUS | Status: AC
  Administered 2020-04-04: 6 mg via SUBCUTANEOUS

## 2020-04-04 NOTE — Patient Instructions (Signed)

## 2020-04-04 NOTE — Telephone Encounter (Signed)
Scheduled per los. Called and left msg. Mailed printout  °

## 2020-04-07 ENCOUNTER — Encounter: Payer: Self-pay | Admitting: Internal Medicine

## 2020-04-08 ENCOUNTER — Encounter: Payer: Self-pay | Admitting: Internal Medicine

## 2020-04-08 ENCOUNTER — Other Ambulatory Visit: Payer: Self-pay

## 2020-04-08 ENCOUNTER — Telehealth: Payer: Self-pay | Admitting: Medical Oncology

## 2020-04-08 ENCOUNTER — Inpatient Hospital Stay: Payer: 59

## 2020-04-08 ENCOUNTER — Other Ambulatory Visit: Payer: Self-pay | Admitting: Physician Assistant

## 2020-04-08 DIAGNOSIS — C3491 Malignant neoplasm of unspecified part of right bronchus or lung: Secondary | ICD-10-CM

## 2020-04-08 DIAGNOSIS — Z5112 Encounter for antineoplastic immunotherapy: Secondary | ICD-10-CM | POA: Diagnosis not present

## 2020-04-08 DIAGNOSIS — C7951 Secondary malignant neoplasm of bone: Secondary | ICD-10-CM

## 2020-04-08 DIAGNOSIS — Z95828 Presence of other vascular implants and grafts: Secondary | ICD-10-CM

## 2020-04-08 LAB — CBC WITH DIFFERENTIAL (CANCER CENTER ONLY)
Abs Immature Granulocytes: 0.14 10*3/uL — ABNORMAL HIGH (ref 0.00–0.07)
Basophils Absolute: 0.1 10*3/uL (ref 0.0–0.1)
Basophils Relative: 1 %
Eosinophils Absolute: 0.1 10*3/uL (ref 0.0–0.5)
Eosinophils Relative: 1 %
HCT: 36.7 % — ABNORMAL LOW (ref 39.0–52.0)
Hemoglobin: 12 g/dL — ABNORMAL LOW (ref 13.0–17.0)
Immature Granulocytes: 1 %
Lymphocytes Relative: 13 %
Lymphs Abs: 2.2 10*3/uL (ref 0.7–4.0)
MCH: 32 pg (ref 26.0–34.0)
MCHC: 32.7 g/dL (ref 30.0–36.0)
MCV: 97.9 fL (ref 80.0–100.0)
Monocytes Absolute: 0.6 10*3/uL (ref 0.1–1.0)
Monocytes Relative: 4 %
Neutro Abs: 14.3 10*3/uL — ABNORMAL HIGH (ref 1.7–7.7)
Neutrophils Relative %: 80 %
Platelet Count: 186 10*3/uL (ref 150–400)
RBC: 3.75 MIL/uL — ABNORMAL LOW (ref 4.22–5.81)
RDW: 17.2 % — ABNORMAL HIGH (ref 11.5–15.5)
WBC Count: 17.6 10*3/uL — ABNORMAL HIGH (ref 4.0–10.5)
nRBC: 0 % (ref 0.0–0.2)

## 2020-04-08 LAB — CMP (CANCER CENTER ONLY)
ALT: 35 U/L (ref 0–44)
AST: 21 U/L (ref 15–41)
Albumin: 3.8 g/dL (ref 3.5–5.0)
Alkaline Phosphatase: 136 U/L — ABNORMAL HIGH (ref 38–126)
Anion gap: 10 (ref 5–15)
BUN: 11 mg/dL (ref 6–20)
CO2: 25 mmol/L (ref 22–32)
Calcium: 9 mg/dL (ref 8.9–10.3)
Chloride: 102 mmol/L (ref 98–111)
Creatinine: 0.76 mg/dL (ref 0.61–1.24)
GFR, Est AFR Am: >60 mL/min (ref >60)
GFR, Estimated: >60 mL/min (ref >60)
Glucose, Bld: 141 mg/dL — ABNORMAL HIGH (ref 70–99)
Potassium: 4 mmol/L (ref 3.5–5.1)
Sodium: 137 mmol/L (ref 135–145)
Total Bilirubin: 0.3 mg/dL (ref 0.3–1.2)
Total Protein: 6.9 g/dL (ref 6.5–8.1)

## 2020-04-08 MED ORDER — SODIUM CHLORIDE 0.9% FLUSH
10.0000 mL | Freq: Once | INTRAVENOUS | Status: AC
  Administered 2020-04-08: 10 mL
  Filled 2020-04-08: qty 10

## 2020-04-08 MED ORDER — OXYCODONE-ACETAMINOPHEN 5-325 MG PO TABS: 1.0000 | ORAL_TABLET | Freq: Four times a day (QID) | ORAL | 0 refills | Status: DC | PRN

## 2020-04-08 MED ORDER — HEPARIN SOD (PORK) LOCK FLUSH 100 UNIT/ML IV SOLN
500.0000 [IU] | Freq: Once | INTRAVENOUS | Status: AC
  Administered 2020-04-08: 500 [IU]
  Filled 2020-04-08: qty 5

## 2020-04-08 NOTE — Telephone Encounter (Signed)
Oxycodone refill requested.

## 2020-04-15 ENCOUNTER — Ambulatory Visit: Payer: 59 | Admitting: Internal Medicine

## 2020-04-15 ENCOUNTER — Other Ambulatory Visit: Payer: Self-pay

## 2020-04-15 ENCOUNTER — Inpatient Hospital Stay: Payer: 59

## 2020-04-15 ENCOUNTER — Ambulatory Visit: Payer: 59

## 2020-04-15 DIAGNOSIS — C3491 Malignant neoplasm of unspecified part of right bronchus or lung: Secondary | ICD-10-CM

## 2020-04-15 DIAGNOSIS — Z95828 Presence of other vascular implants and grafts: Secondary | ICD-10-CM

## 2020-04-15 DIAGNOSIS — Z5112 Encounter for antineoplastic immunotherapy: Secondary | ICD-10-CM | POA: Diagnosis not present

## 2020-04-15 LAB — CBC WITH DIFFERENTIAL (CANCER CENTER ONLY)
Abs Immature Granulocytes: 0.07 10*3/uL (ref 0.00–0.07)
Basophils Absolute: 0 10*3/uL (ref 0.0–0.1)
Basophils Relative: 0 %
Eosinophils Absolute: 0 10*3/uL (ref 0.0–0.5)
Eosinophils Relative: 0 %
HCT: 36.9 % — ABNORMAL LOW (ref 39.0–52.0)
Hemoglobin: 12.1 g/dL — ABNORMAL LOW (ref 13.0–17.0)
Immature Granulocytes: 1 %
Lymphocytes Relative: 13 %
Lymphs Abs: 1.7 10*3/uL (ref 0.7–4.0)
MCH: 32.3 pg (ref 26.0–34.0)
MCHC: 32.8 g/dL (ref 30.0–36.0)
MCV: 98.4 fL (ref 80.0–100.0)
Monocytes Absolute: 0.6 10*3/uL (ref 0.1–1.0)
Monocytes Relative: 4 %
Neutro Abs: 10.9 10*3/uL — ABNORMAL HIGH (ref 1.7–7.7)
Neutrophils Relative %: 82 %
Platelet Count: 153 10*3/uL (ref 150–400)
RBC: 3.75 MIL/uL — ABNORMAL LOW (ref 4.22–5.81)
RDW: 17.3 % — ABNORMAL HIGH (ref 11.5–15.5)
WBC Count: 13.3 10*3/uL — ABNORMAL HIGH (ref 4.0–10.5)
nRBC: 0 % (ref 0.0–0.2)

## 2020-04-15 LAB — CMP (CANCER CENTER ONLY)
ALT: 45 U/L — ABNORMAL HIGH (ref 0–44)
AST: 27 U/L (ref 15–41)
Albumin: 3.8 g/dL (ref 3.5–5.0)
Alkaline Phosphatase: 135 U/L — ABNORMAL HIGH (ref 38–126)
Anion gap: 10 (ref 5–15)
BUN: 6 mg/dL (ref 6–20)
CO2: 24 mmol/L (ref 22–32)
Calcium: 9.2 mg/dL (ref 8.9–10.3)
Chloride: 106 mmol/L (ref 98–111)
Creatinine: 0.74 mg/dL (ref 0.61–1.24)
GFR, Est AFR Am: >60 mL/min (ref >60)
GFR, Estimated: >60 mL/min (ref >60)
Glucose, Bld: 102 mg/dL — ABNORMAL HIGH (ref 70–99)
Potassium: 3.9 mmol/L (ref 3.5–5.1)
Sodium: 140 mmol/L (ref 135–145)
Total Bilirubin: 0.3 mg/dL (ref 0.3–1.2)
Total Protein: 6.9 g/dL (ref 6.5–8.1)

## 2020-04-15 MED ORDER — HEPARIN SOD (PORK) LOCK FLUSH 100 UNIT/ML IV SOLN
500.0000 [IU] | Freq: Once | INTRAVENOUS | Status: AC
  Administered 2020-04-15: 500 [IU]
  Filled 2020-04-15: qty 5

## 2020-04-15 MED ORDER — SODIUM CHLORIDE 0.9% FLUSH
10.0000 mL | Freq: Once | INTRAVENOUS | Status: AC
  Administered 2020-04-15: 10 mL
  Filled 2020-04-15: qty 10

## 2020-04-17 ENCOUNTER — Encounter: Payer: Self-pay | Admitting: Family Medicine

## 2020-04-21 ENCOUNTER — Encounter: Payer: Self-pay | Admitting: Family Medicine

## 2020-04-21 NOTE — Progress Notes (Signed)
[place] OFFICE PROGRESS NOTE  [name], MD [place] [address]  DIAGNOSIS: Stage IV (T2b, N3, M1C) non-small cell lung cancer, squamous cell carcinoma presented with right upper lobe lung mass in addition to mediastinal and right supraclavicular lymphadenopathy as well as multiple metastatic bone lesions diagnosed in March 2021.  PRIOR THERAPY: Palliative radiotherapy to the painful metastatic bone lesions under the care of Dr. Sondra Come. Last dose 01/14/20  CURRENT THERAPY: Palliative systemic chemotherapy withcarboplatin for AUC of 5, paclitaxel 175 NG/M2 and Keytruda 200 mg IV every 3 weeks with Neulasta support.First dose 01/03/20. Status post4cycles.Starting from cycle #5 he will be on maintenance single agent Keytruda.   INTERVAL HISTORY: Mike [name] 54 y.o. male returns to the clinic for a follow up visit. The patient is feeling well today without any concerning complaints except for some persistent hip pain where he received radiation due to the metastatic bone lesion.The patient continues to tolerate treatment with chemotherapy/immunotherapy well without any adverse effects except he does experience back pain about 4 days after his neulasta injection. During this, he takes Claritin without an appreciable difference. He also has a prescription for percocet for his pain secondary to his osseous metastases. Denies any fever, chills, or weight loss. He reportedly experiences night sweats the night following his treatment.  He reports his baseline dyspnea on exertion. He believes his cough has improved. Denies any chest pain or hemoptysis. Denies any nausea, vomiting, diarrhea, or constipation. Denies any headache or visual changes. Denies any rashes or skin changes. The patient is here today for evaluation prior to starting cycle # 5   MEDICAL HISTORY: Past Medical History:  Diagnosis Date  . Arthritis    neck  . Cancer, metastatic to  bone ([place]) dx'd 10/2019  . Diabetes ([place])   . Fatty liver 12/05/2019   By [place] 11/2019  . GERD (gastroesophageal reflux disease)   . 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 High cholesterol   . IBS (irritable bowel syndrome)   . lung ca dx'd 10/2019   lung stage 4   . OSA (obstructive sleep apnea) 09/15/2019   Sleep study 12/2017 - severe OSA with AHI 65.6, desat to 76% rec CPAP  . Severe obesity (BMI 35.0-39.9) with comorbidity ([place]) 09/04/2019    ALLERGIES:  has No Known Allergies.  MEDICATIONS:  Current Outpatient Medications  Medication Sig Dispense Refill  . albuterol (PROVENTIL) (2.5 MG/3ML) 0.083% nebulizer solution Take 3 mLs (2.5 mg total) by nebulization every 6 (six) hours as needed for wheezing or shortness of breath. 75 mL 12  . apixaban (ELIQUIS) 5 MG TABS tablet Take 2 tabs (10mg ) twice daily for 7 days. On 01/29/20, change to 1 tab (5mg ) twice a day. 60 tablet 3  . atorvastatin (LIPITOR) 20 MG tablet Take 1 tablet (20 mg total) by mouth daily. (Patient taking differently: Take 10 mg by mouth in the morning and at bedtime. ) 90 tablet 3  . diazepam (VALIUM) 5 MG tablet Take 1 tablet (5 mg total) by mouth every 12 (twelve) hours as needed for anxiety or muscle spasms. 30 tablet 0  . lidocaine-prilocaine (EMLA) cream Apply to the Port-A-Cath site 30 minutes before treatment 30 g 0  . metFORMIN (GLUCOPHAGE) 1000 MG tablet Take 1 tablet (1,000 mg total) by mouth 2 (two) times daily with a meal. 180 tablet 3  . omeprazole (PRILOSEC) 40 MG capsule Take 1 capsule (40 mg total)  by mouth daily. For 3 weeks then as needed 30 capsule 3  . oxyCODONE-acetaminophen (PERCOCET/ROXICET) 5-325 MG tablet Take 1 tablet by mouth every 6 (six) hours as needed for severe pain. 40 tablet 0  . phenazopyridine (PYRIDIUM) 200 MG tablet Take 1 tablet (200 mg total) by mouth 3 (three) times daily as needed for pain. (Patient not taking: Reported on 03/05/2020) 21 tablet 0  . sildenafil (REVATIO) 20 MG  tablet Take 20-100 mg by mouth daily as needed (ED).      No current facility-administered medications for this visit.    SURGICAL HISTORY:  Past Surgical History:  Procedure Laterality Date  . BIOPSY  12/18/2019   Procedure: BIOPSY;  Surgeon: Garner Nash, DO;  Location: [facility];  Service: Pulmonary;;  . BRONCHIAL BRUSHINGS  12/18/2019   Procedure: BRONCHIAL BRUSHINGS;  Surgeon: Garner Nash, DO;  Location: [facility];  Service: Pulmonary;;  . BRONCHIAL WASHINGS  12/18/2019   Procedure: BRONCHIAL WASHINGS;  Surgeon: Garner Nash, DO;  Location: [facility];  Service: Pulmonary;;  . COLONOSCOPY  10/2019   mult TA, int hem, rpt 1 yr ([place])  . CYSTECTOMY     knee- left knee cyst  . ENDOBRONCHIAL ULTRASOUND  12/18/2019   Procedure: ENDOBRONCHIAL ULTRASOUND;  Surgeon: Garner Nash, DO;  Location: [facility];  Service: Pulmonary;;  . FINE NEEDLE ASPIRATION  12/18/2019   Procedure: FINE NEEDLE ASPIRATION;  Surgeon: Garner Nash, DO;  Location: [facility];  Service: Pulmonary;;  . IR IMAGING GUIDED PORT INSERTION  12/27/2019  . PILONIDAL CYST EXCISION    . VIDEO BRONCHOSCOPY WITH ENDOBRONCHIAL ULTRASOUND Right 12/18/2019   Procedure: VIDEO BRONCHOSCOPY;  Surgeon: Garner Nash, DO;  Location: [facility];  Service: Pulmonary;  Laterality: Right;    REVIEW OF SYSTEMS:   Review of Systems  Constitutional: Negative for appetite change, chills, fatigue, fever and unexpected weight change.  HENT: Negative for mouth sores, nosebleeds, sore throat and trouble swallowing.  Eyes: Negative for eye problems and icterus. Respiratory:Positive for baseline dyspnea on exertion.Negative for hemoptysis, cough, and wheezing. Gastrointestinal: Negative for abdominal pain, constipation, diarrhea, nausea and vomiting.  Genitourinary: Negative for bladder incontinence, difficulty urinating, dysuria, frequency and hematuria. Musculoskeletal:Positive for right hip  pain.Negative for back pain, gait problem, neck pain and neck stiffness.  Skin: Negative for itching and rash.  Neurological: Negative for dizziness, extremity weakness, gait problem, headaches, light-headedness and seizures.  Hematological: Negative for adenopathy. Does not bruise/bleed easily.  Psychiatric/Behavioral: Negative for confusion, depression and sleep disturbance. The patient is not nervous/anxious.   PHYSICAL EXAMINATION:  Blood pressure 126/87, pulse 98, temperature 97.8 F (36.6 C), temperature source Temporal, resp. rate 18, height 5\' 11"  (1.803 m), weight 271 lb 9.6 oz (123.2 kg), SpO2 98 %.  ECOG PERFORMANCE STATUS: 1 - Symptomatic but completely ambulatory  Physical Exam  Constitutional: Oriented to person, place, and time and well-developed, well-nourished, and in no distress.   HENT:  Head: Normocephalic and atraumatic.  Mouth/Throat: Oropharynx is clear and moist. No oropharyngeal exudate.  Eyes: Conjunctivae are normal. Right eye exhibits no discharge. Left eye exhibits no discharge. No scleral icterus.  Neck: Normal range of motion. Neck supple.  Cardiovascular: Normal rate, regular rhythm, normal heart sounds and intact distal pulses.   Pulmonary/Chest: Effort normal and breath sounds normal. No respiratory distress. No wheezes. No rales.  Abdominal: Soft. Bowel sounds are normal. Exhibits no distension and no mass. There is no tenderness.  Musculoskeletal: Normal range of motion. Exhibits no  edema.  Lymphadenopathy:    No cervical adenopathy.  Neurological: Alert and oriented to person, place, and time. Exhibits normal muscle tone. Gait normal. Coordination normal.  Skin: Skin is warm and dry. No rash noted. Not diaphoretic. No erythema. No pallor.  Psychiatric: Mood, memory and judgment normal.  Vitals reviewed.  LABORATORY DATA: Lab Results  Component Value Date   WBC 8.4 04/23/2020   HGB 12.6 (L) 04/23/2020   HCT 38.9 (L) 04/23/2020   MCV 99.2  04/23/2020   PLT 219 04/23/2020      Chemistry      Component Value Date/Time   NA 140 04/15/2020 1327   K 3.9 04/15/2020 1327   CL 106 04/15/2020 1327   CO2 24 04/15/2020 1327   BUN 6 04/15/2020 1327   CREATININE 0.74 04/15/2020 1327      Component Value Date/Time   CALCIUM 9.2 04/15/2020 1327   ALKPHOS 135 (H) 04/15/2020 1327   AST 27 04/15/2020 1327   ALT 45 (H) 04/15/2020 1327   BILITOT 0.3 04/15/2020 1327       RADIOGRAPHIC STUDIES:  No results found.   ASSESSMENT/PLAN:  This is a very pleasant 48 year oldCaucasianmale recently diagnosed with a stage IV (T2b, N3, M1C) non-small cell lung cancer, squamous cell carcinoma presented with right upper lobe lung mass in addition to mediastinal and right supraclavicular lymphadenopathy as well as multiple metastatic bone lesions diagnosed in March 2021.  The patientcompletedpalliative radiotherapy to the painful bone lesions under the care of Dr.Kinard.Last treatment on 01/14/20.  The patient is currently undergoing palliative systemic chemotherapy with carboplatin for an AUC of 5, paclitaxel 175mg /m, and Keytruda 200 mg IV every 3 weeks with Neulasta support. He is status post4cycles. Starting from cycle #5 he will be on maintenance single agent Keytruda.   Labs were reviewed. Recommend that he proceed with cycle #5 today of single agent Keytruda as scheduled.   We will see him back for follow-up visit in 3 weeks for evaluationbefore starting cycle #6.  He will continue taking eliquis for his recentpulmonary embolism.  I will send a refill of her percocet for his cancer related pain due to the metastatic lesion on his hip.   The patient was advised to call immediately if he has any concerning symptoms in the interval. The patient voices understanding of current disease status and treatment options and is in agreement with the current care plan. All questions were answered. The patient knows to call the  clinic with any problems, questions or concerns. We can certainly see the patient much sooner if necessary      Orders Placed This Encounter  Procedures  . CBC with Differential (Cancer Center Only)    Standing Status:   Standing    Number of Occurrences:   18    Standing Expiration Date:   04/23/2021  . CMP ([facility] only)    Standing Status:   Standing    Number of Occurrences:   18    Standing Expiration Date:   04/23/2021     Isabelle Matt L Keyleigh Manninen, PA-C 04/23/20

## 2020-04-23 ENCOUNTER — Other Ambulatory Visit: Payer: Self-pay

## 2020-04-23 ENCOUNTER — Inpatient Hospital Stay: Payer: 59

## 2020-04-23 ENCOUNTER — Inpatient Hospital Stay: Payer: 59 | Attending: Physician Assistant | Admitting: Physician Assistant

## 2020-04-23 VITALS — BP 126/87 | HR 98 | Temp 97.8°F | Resp 18 | Ht 71.0 in | Wt 271.6 lb

## 2020-04-23 DIAGNOSIS — K760 Fatty (change of) liver, not elsewhere classified: Secondary | ICD-10-CM | POA: Insufficient documentation

## 2020-04-23 DIAGNOSIS — K219 Gastro-esophageal reflux disease without esophagitis: Secondary | ICD-10-CM | POA: Insufficient documentation

## 2020-04-23 DIAGNOSIS — C3491 Malignant neoplasm of unspecified part of right bronchus or lung: Secondary | ICD-10-CM

## 2020-04-23 DIAGNOSIS — Z79899 Other long term (current) drug therapy: Secondary | ICD-10-CM | POA: Insufficient documentation

## 2020-04-23 DIAGNOSIS — R05 Cough: Secondary | ICD-10-CM | POA: Insufficient documentation

## 2020-04-23 DIAGNOSIS — E7800 Pure hypercholesterolemia, unspecified: Secondary | ICD-10-CM | POA: Insufficient documentation

## 2020-04-23 DIAGNOSIS — C3411 Malignant neoplasm of upper lobe, right bronchus or lung: Secondary | ICD-10-CM | POA: Insufficient documentation

## 2020-04-23 DIAGNOSIS — R61 Generalized hyperhidrosis: Secondary | ICD-10-CM | POA: Insufficient documentation

## 2020-04-23 DIAGNOSIS — K589 Irritable bowel syndrome without diarrhea: Secondary | ICD-10-CM | POA: Diagnosis not present

## 2020-04-23 DIAGNOSIS — Z5112 Encounter for antineoplastic immunotherapy: Secondary | ICD-10-CM | POA: Diagnosis present

## 2020-04-23 DIAGNOSIS — R0609 Other forms of dyspnea: Secondary | ICD-10-CM | POA: Diagnosis not present

## 2020-04-23 DIAGNOSIS — Z95828 Presence of other vascular implants and grafts: Secondary | ICD-10-CM

## 2020-04-23 DIAGNOSIS — G4733 Obstructive sleep apnea (adult) (pediatric): Secondary | ICD-10-CM | POA: Insufficient documentation

## 2020-04-23 DIAGNOSIS — Z7984 Long term (current) use of oral hypoglycemic drugs: Secondary | ICD-10-CM | POA: Insufficient documentation

## 2020-04-23 DIAGNOSIS — E119 Type 2 diabetes mellitus without complications: Secondary | ICD-10-CM | POA: Diagnosis not present

## 2020-04-23 DIAGNOSIS — G893 Neoplasm related pain (acute) (chronic): Secondary | ICD-10-CM | POA: Insufficient documentation

## 2020-04-23 DIAGNOSIS — C7951 Secondary malignant neoplasm of bone: Secondary | ICD-10-CM | POA: Diagnosis not present

## 2020-04-23 LAB — CBC WITH DIFFERENTIAL (CANCER CENTER ONLY)
Abs Immature Granulocytes: 0.04 10*3/uL (ref 0.00–0.07)
Basophils Absolute: 0 10*3/uL (ref 0.0–0.1)
Basophils Relative: 1 %
Eosinophils Absolute: 0 10*3/uL (ref 0.0–0.5)
Eosinophils Relative: 0 %
HCT: 38.9 % — ABNORMAL LOW (ref 39.0–52.0)
Hemoglobin: 12.6 g/dL — ABNORMAL LOW (ref 13.0–17.0)
Immature Granulocytes: 1 %
Lymphocytes Relative: 22 %
Lymphs Abs: 1.9 10*3/uL (ref 0.7–4.0)
MCH: 32.1 pg (ref 26.0–34.0)
MCHC: 32.4 g/dL (ref 30.0–36.0)
MCV: 99.2 fL (ref 80.0–100.0)
Monocytes Absolute: 0.8 10*3/uL (ref 0.1–1.0)
Monocytes Relative: 10 %
Neutro Abs: 5.6 10*3/uL (ref 1.7–7.7)
Neutrophils Relative %: 66 %
Platelet Count: 219 10*3/uL (ref 150–400)
RBC: 3.92 MIL/uL — ABNORMAL LOW (ref 4.22–5.81)
RDW: 16.8 % — ABNORMAL HIGH (ref 11.5–15.5)
WBC Count: 8.4 10*3/uL (ref 4.0–10.5)
nRBC: 0 % (ref 0.0–0.2)

## 2020-04-23 LAB — CMP (CANCER CENTER ONLY)
ALT: 40 U/L (ref 0–44)
AST: 24 U/L (ref 15–41)
Albumin: 3.9 g/dL (ref 3.5–5.0)
Alkaline Phosphatase: 95 U/L (ref 38–126)
Anion gap: 11 (ref 5–15)
BUN: 10 mg/dL (ref 6–20)
CO2: 24 mmol/L (ref 22–32)
Calcium: 9.3 mg/dL (ref 8.9–10.3)
Chloride: 103 mmol/L (ref 98–111)
Creatinine: 0.82 mg/dL (ref 0.61–1.24)
GFR, Est AFR Am: >60 mL/min (ref >60)
GFR, Estimated: >60 mL/min (ref >60)
Glucose, Bld: 154 mg/dL — ABNORMAL HIGH (ref 70–99)
Potassium: 4.2 mmol/L (ref 3.5–5.1)
Sodium: 138 mmol/L (ref 135–145)
Total Bilirubin: 0.3 mg/dL (ref 0.3–1.2)
Total Protein: 7.2 g/dL (ref 6.5–8.1)

## 2020-04-23 LAB — TSH: TSH: 3.523 u[IU]/mL (ref 0.320–4.118)

## 2020-04-23 MED ORDER — SODIUM CHLORIDE 0.9 % IV SOLN
Freq: Once | INTRAVENOUS | Status: AC
  Filled 2020-04-23: qty 250

## 2020-04-23 MED ORDER — SODIUM CHLORIDE 0.9% FLUSH
10.0000 mL | INTRAVENOUS | Status: DC | PRN
  Administered 2020-04-23: 10 mL
  Filled 2020-04-23: qty 10

## 2020-04-23 MED ORDER — OXYCODONE-ACETAMINOPHEN 5-325 MG PO TABS: 1.0000 | ORAL_TABLET | Freq: Four times a day (QID) | ORAL | 0 refills | Status: DC | PRN

## 2020-04-23 MED ORDER — HEPARIN SOD (PORK) LOCK FLUSH 100 UNIT/ML IV SOLN
500.0000 [IU] | Freq: Once | INTRAVENOUS | Status: AC | PRN
  Administered 2020-04-23: 500 [IU]
  Filled 2020-04-23: qty 5

## 2020-04-23 MED ORDER — SODIUM CHLORIDE 0.9% FLUSH
10.0000 mL | Freq: Once | INTRAVENOUS | Status: AC
  Administered 2020-04-23: 10 mL
  Filled 2020-04-23: qty 10

## 2020-04-23 MED ORDER — SODIUM CHLORIDE 0.9 % IV SOLN
200.0000 mg | Freq: Once | INTRAVENOUS | Status: AC
  Administered 2020-04-23: 200 mg via INTRAVENOUS
  Filled 2020-04-23: qty 8

## 2020-04-23 NOTE — Patient Instructions (Signed)
[facility] Discharge Instructions for Patients Receiving Chemotherapy  Today you received the following chemotherapy agents:  Keytruda.  To help prevent nausea and vomiting after your treatment, we encourage you to take your nausea medication as directed.   If you develop nausea and vomiting that is not controlled by your nausea medication, call the clinic.   BELOW ARE SYMPTOMS THAT SHOULD BE REPORTED IMMEDIATELY:  *FEVER GREATER THAN 100.5 F  *CHILLS WITH OR WITHOUT FEVER  NAUSEA AND VOMITING THAT IS NOT CONTROLLED WITH YOUR NAUSEA MEDICATION  *UNUSUAL SHORTNESS OF BREATH  *UNUSUAL BRUISING OR BLEEDING  TENDERNESS IN MOUTH AND THROAT WITH OR WITHOUT PRESENCE OF ULCERS  *URINARY PROBLEMS  *BOWEL PROBLEMS  UNUSUAL RASH Items with * indicate a potential emergency and should be followed up as soon as possible.  Feel free to call the clinic should you have any questions or concerns. The clinic phone number is [phone].  Please show the CHEMO ALERT CARD at check-in to the Emergency Department and triage nurse.    

## 2020-04-25 ENCOUNTER — Telehealth: Payer: Self-pay | Admitting: Internal Medicine

## 2020-04-25 NOTE — Telephone Encounter (Signed)
Scheduled per 07/07 los, patient has been called and voicemail was left.

## 2020-04-29 ENCOUNTER — Encounter: Payer: Self-pay | Admitting: Family Medicine

## 2020-04-30 ENCOUNTER — Encounter: Payer: Self-pay | Admitting: Internal Medicine

## 2020-04-30 NOTE — Progress Notes (Signed)
Returned call to patient whom had concerns regarding insurance and COBRA. Advised patient of the 56% uninsured discount and programs he could apply for that his income would be needed to determine eligibility if seeking other insurance was not an option. He was very Patent attorney.  He asked about the [employer] we discussed previously. Discussed the qualifications and what is needed to apply. He states he will wait and revisit later.  He has my card for any additional financial questions or concerns.

## 2020-05-01 MED ORDER — ATORVASTATIN CALCIUM 20 MG PO TABS: 20.0000 mg | ORAL_TABLET | Freq: Every evening | ORAL | 3 refills | Status: DC

## 2020-05-01 NOTE — Telephone Encounter (Signed)
I misunderstood what was on pt's atorvastatin directions.  I saw 20 mg daily, 10 mg in AM and 10 mg at h.s.  After a closer look, I see take 20 mg daily but pt is taking differently- 10 mg in AM, then 10 mg h.s.  I don't see in the office notes that you gave these instructions.   Is it ok to add this to the directions or should pt take as prescribed.  Plz advise.

## 2020-05-01 NOTE — Addendum Note (Signed)
Addended by: [name] on: 05/01/2020 01:44 PM   Modules accepted: Orders

## 2020-05-12 NOTE — Progress Notes (Signed)
[facility] OFFICE PROGRESS NOTE  [name], MD [place] [address]  DIAGNOSIS: Stage IV (T2b, N3, M1C) non-small cell lung cancer, squamous cell carcinoma presented with right upper lobe lung mass in addition to mediastinal and right supraclavicular lymphadenopathy as well as multiple metastatic bone lesions diagnosed in March 2021.  PRIOR THERAPY: Palliative radiotherapy to the painful metastatic bone lesions under the care of Dr. Sondra Come. Last dose 01/14/20  CURRENT THERAPY: Palliative systemic chemotherapy withcarboplatin for AUC of 5, paclitaxel 175 NG/M2 and Keytruda 200 mg IV every 3 weeks with Neulasta support.First dose 01/03/20. Status post5cycles.Starting from cycle #5 he will be on maintenance single agent Keytruda  INTERVAL HISTORY: Mike [name] 54 y.o. male returns to the clinic for a follow up visit. The patient is feeling well today without any concerningcomplaints except some mild intermittent vertigo. He mostly experiences his symptoms when he is laying down in bed. He had episodic vertigo in the past 2-3 years ago. The patient continues to tolerate treatment with maintenance immunotherapy well without any adverse effects.  He also has a prescription for percocet for his pain secondary to his osseous metastases. He received palliative radiotherapy to this region. He is requesting a refill of his percocet. He denies any fever, chills, night sweats, or weight loss.He reports his baseline dyspnea on exertion but states that it is mild and that he does not "get too short of breath". He believes his cough has improved and he rarely coughs anymore. Denies any chest pain or hemoptysis. Denies any nausea, vomiting, or constipation. He mentions that he has chronic diarrhea "all of my life". He has not noticed a change with his baseline diarrhea. He denies abdominal pain or blood in his stool. Denies any headache or visual changes. Denies any  rashes or skin changes. The patient is here today for evaluation prior to starting cycle # 6.    MEDICAL HISTORY: Past Medical History:  Diagnosis Date  . Arthritis    neck  . Cancer, metastatic to bone ([place]) dx'd 10/2019  . Diabetes ([place])   . Fatty liver 12/05/2019   By [place] 11/2019  . GERD (gastroesophageal reflux disease)   . 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 High cholesterol   . IBS (irritable bowel syndrome)   . lung ca dx'd 10/2019   lung stage 4   . OSA (obstructive sleep apnea) 09/15/2019   Sleep study 12/2017 - severe OSA with AHI 65.6, desat to 76% rec CPAP  . Severe obesity (BMI 35.0-39.9) with comorbidity ([place]) 09/04/2019    ALLERGIES:  has No Known Allergies.  MEDICATIONS:  Current Outpatient Medications  Medication Sig Dispense Refill  . albuterol (PROVENTIL) (2.5 MG/3ML) 0.083% nebulizer solution Take 3 mLs (2.5 mg total) by nebulization every 6 (six) hours as needed for wheezing or shortness of breath. 75 mL 12  . apixaban (ELIQUIS) 5 MG TABS tablet Take 2 tabs (10mg ) twice daily for 7 days. On 01/29/20, change to 1 tab (5mg ) twice a day. 60 tablet 3  . atorvastatin (LIPITOR) 20 MG tablet Take 1 tablet (20 mg total) by mouth at bedtime. 90 tablet 3  . diazepam (VALIUM) 5 MG tablet Take 1 tablet (5 mg total) by mouth every 12 (twelve) hours as needed for anxiety or muscle spasms. 30 tablet 0  . lidocaine-prilocaine (EMLA) cream Apply to the Port-A-Cath site 30 minutes before treatment 30 g 0  . metFORMIN (GLUCOPHAGE) 1000  MG tablet Take 1 tablet (1,000 mg total) by mouth 2 (two) times daily with a meal. 180 tablet 3  . omeprazole (PRILOSEC) 40 MG capsule Take 1 capsule (40 mg total) by mouth daily. For 3 weeks then as needed 30 capsule 3  . oxyCODONE-acetaminophen (PERCOCET/ROXICET) 5-325 MG tablet Take 1 tablet by mouth every 6 (six) hours as needed for severe pain. 40 tablet 0  . sildenafil (REVATIO) 20 MG tablet Take 20-100 mg by mouth daily as needed  (ED).     . phenazopyridine (PYRIDIUM) 200 MG tablet Take 1 tablet (200 mg total) by mouth 3 (three) times daily as needed for pain. (Patient not taking: Reported on 03/05/2020) 21 tablet 0   No current facility-administered medications for this visit.   Facility-Administered Medications Ordered in Other Visits  Medication Dose Route Frequency Provider Last Rate Last Admin  . heparin lock flush 100 unit/mL  500 Units Intracatheter Once PRN [name], MD      . sodium chloride flush (NS) 0.9 % injection 10 mL  10 mL Intracatheter PRN [name], MD        SURGICAL HISTORY:  Past Surgical History:  Procedure Laterality Date  . BIOPSY  12/18/2019   Procedure: BIOPSY;  Surgeon: Garner Nash, DO;  Location: [facility];  Service: Pulmonary;;  . BRONCHIAL BRUSHINGS  12/18/2019   Procedure: BRONCHIAL BRUSHINGS;  Surgeon: Garner Nash, DO;  Location: [facility];  Service: Pulmonary;;  . BRONCHIAL WASHINGS  12/18/2019   Procedure: BRONCHIAL WASHINGS;  Surgeon: Garner Nash, DO;  Location: [facility];  Service: Pulmonary;;  . COLONOSCOPY  10/2019   mult TA, int hem, rpt 1 yr ([place])  . CYSTECTOMY     knee- left knee cyst  . ENDOBRONCHIAL ULTRASOUND  12/18/2019   Procedure: ENDOBRONCHIAL ULTRASOUND;  Surgeon: Garner Nash, DO;  Location: [facility];  Service: Pulmonary;;  . FINE NEEDLE ASPIRATION  12/18/2019   Procedure: FINE NEEDLE ASPIRATION;  Surgeon: Garner Nash, DO;  Location: [facility];  Service: Pulmonary;;  . IR IMAGING GUIDED PORT INSERTION  12/27/2019  . PILONIDAL CYST EXCISION    . VIDEO BRONCHOSCOPY WITH ENDOBRONCHIAL ULTRASOUND Right 12/18/2019   Procedure: VIDEO BRONCHOSCOPY;  Surgeon: Garner Nash, DO;  Location: [facility];  Service: Pulmonary;  Laterality: Right;    REVIEW OF SYSTEMS:   Review of Systems  Constitutional: Negative for appetite change, chills, fatigue, fever and unexpected weight change.  HENT: Negative for mouth  sores, nosebleeds, sore throat and trouble swallowing.  Eyes: Negative for eye problems and icterus. Respiratory:Positive for mild baseline dyspnea on exertion.Negative for hemoptysis,cough,and wheezing. Gastrointestinal: Negative for abdominal pain, constipation, diarrhea, nausea and vomiting.  Genitourinary: Negative for bladder incontinence, difficulty urinating, dysuria, frequency and hematuria. Musculoskeletal:Positive for right hip pain.Negative for back pain, gait problem, neck pain and neck stiffness.  Skin: Negative for itching and rash.  Neurological: Positive for mild episodic vertigo. Negative for dizziness, extremity weakness, gait problem, headaches, light-headedness and seizures.  Hematological: Negative for adenopathy. Does not bruise/bleed easily.  Psychiatric/Behavioral: Negative for confusion, depression and sleep disturbance. The patient is not nervous/anxious.     PHYSICAL EXAMINATION:  Blood pressure 128/82, pulse 98, temperature 97.8 F (36.6 C), temperature source Oral, resp. rate 18, height 5\' 11"  (1.803 m), weight (!) 269 lb 3.2 oz (122.1 kg), SpO2 97 %.  ECOG PERFORMANCE STATUS: 1 - Symptomatic but completely ambulatory  Physical Exam  Constitutional: Oriented to person, place, and time and well-developed, well-nourished, and  in no distress. No distress.  HENT:  Head: Normocephalic and atraumatic.  Mouth/Throat: Oropharynx is clear and moist. No oropharyngeal exudate.  Eyes: Conjunctivae are normal. Right eye exhibits no discharge. Left eye exhibits no discharge. No scleral icterus.  Neck: Normal range of motion. Neck supple.  Cardiovascular: Normal rate, regular rhythm, normal heart sounds and intact distal pulses.   Pulmonary/Chest: Effort normal and breath sounds normal. No respiratory distress. No wheezes. No rales.  Abdominal: Soft. Bowel sounds are normal. Exhibits no distension and no mass. There is no tenderness.  Musculoskeletal: Normal  range of motion. Exhibits no edema.  Lymphadenopathy:    No cervical adenopathy.  Neurological: Alert and oriented to person, place, and time. Exhibits normal muscle tone. Gait normal. Coordination normal.  Skin: Skin is warm and dry. No rash noted. Not diaphoretic. No erythema. No pallor.  Psychiatric: Mood, memory and judgment normal.  Vitals reviewed.  LABORATORY DATA: Lab Results  Component Value Date   WBC 9.6 05/14/2020   HGB 13.5 05/14/2020   HCT 40.8 05/14/2020   MCV 96.7 05/14/2020   PLT 247 05/14/2020      Chemistry      Component Value Date/Time   NA 139 05/14/2020 1357   K 4.2 05/14/2020 1357   CL 103 05/14/2020 1357   CO2 25 05/14/2020 1357   BUN 11 05/14/2020 1357   CREATININE 0.80 05/14/2020 1357      Component Value Date/Time   CALCIUM 10.3 05/14/2020 1357   ALKPHOS 91 05/14/2020 1357   AST 27 05/14/2020 1357   ALT 38 05/14/2020 1357   BILITOT 0.3 05/14/2020 1357       RADIOGRAPHIC STUDIES:  No results found.   ASSESSMENT/PLAN:  This is a very pleasant 56 year oldCaucasianmale recently diagnosed with a stage IV (T2b, N3, M1C) non-small cell lung cancer, squamous cell carcinoma presented with right upper lobe lung mass in addition to mediastinal and right supraclavicular lymphadenopathy as well as multiple metastatic bone lesions diagnosed in March 2021.  The patientcompletedpalliative radiotherapy to the painful bone lesions under the care of Dr.Kinard.Last treatment on 01/14/20.  The patient is currently undergoing palliative systemic chemotherapy with carboplatin for an AUC of 5, paclitaxel 175mg /m, and Keytruda 200 mg IV every 3 weeks with Neulasta support. He is status post5cycles. Starting from cycle #5 he will be on maintenance single agent Keytruda.   The patient was seen with Dr. Julien Nordmann. Labs were reviewed. Recommend that he proceed with cycle #6 today of single agent Keytruda as scheduled.   I will arrange for a restaging CT  scan of the chest, abdomen, and pelvis prior to his next cycle of treatment.   We will see him back for follow-up visit in 3 weeks for evaluationand to review his scan before starting cycle #7.  He will continue taking eliquis for his recentpulmonary embolism.  I will send a refill of her percocet for his cancer related pain due to the metastatic lesion on his hip. I discussed that I would like him to start trying to take less oxycodone and to save it for when it is really needed. I encouraged him to try to use tylenol instead.   The patient was advised to call immediately if he has any concerning symptoms in the interval. The patient voices understanding of current disease status and treatment options and is in agreement with the current care plan. All questions were answered. The patient knows to call the clinic with any problems, questions or concerns. We  can certainly see the patient much sooner if necessary  Orders Placed This Encounter  Procedures  . CT Chest W Contrast    Standing Status:   Future    Standing Expiration Date:   05/14/2021    Order Specific Question:   If indicated for the ordered procedure, I authorize the administration of contrast media per Radiology protocol    Answer:   Yes    Order Specific Question:   Preferred imaging location?    Answer:   [facility]    Order Specific Question:   Radiology Contrast Protocol - do NOT remove file path    Answer:   \\[email or web address]  . CT Abdomen Pelvis W Contrast    Standing Status:   Future    Standing Expiration Date:   05/14/2021    Order Specific Question:   If indicated for the ordered procedure, I authorize the administration of contrast media per Radiology protocol    Answer:   Yes    Order Specific Question:   Preferred imaging location?    Answer:   [facility]    Order Specific Question:   Is Oral Contrast requested for this exam?    Answer:   Yes, Per Radiology  protocol    Order Specific Question:   Radiology Contrast Protocol - do NOT remove file path    Answer:   \\[email or web address]     Clariece Roesler L Colton Engdahl, PA-C 05/14/20  ADDENDUM: Hematology/Oncology Attending: I had a face-to-face encounter with the patient today.  I recommended his care plan.  This is a very pleasant 54 years old white male with a stage IV non-small cell lung cancer, squamous cell carcinoma status post 4 cycles of systemic chemotherapy with carboplatin, paclitaxel and Keytruda.  Starting from cycle #5 the patient is currently on treatment with single agent Keytruda status post 1 cycle.  He tolerated the first cycle of his maintenance therapy fairly well with no concerning adverse effects. I recommended for him to proceed with cycle #6 today as planned. I will see him back for follow-up visit in 3 weeks for evaluation with repeat CT scan of the chest, abdomen pelvis for restaging of his disease. The patient was advised to call immediately if he has any concerning symptoms in the interval.  Disclaimer: This note was dictated with voice recognition software. Similar sounding words can inadvertently be transcribed and may be missed upon review. Eilleen Kempf, MD 05/14/20

## 2020-05-14 ENCOUNTER — Inpatient Hospital Stay (HOSPITAL_BASED_OUTPATIENT_CLINIC_OR_DEPARTMENT_OTHER): Payer: 59 | Admitting: Physician Assistant

## 2020-05-14 ENCOUNTER — Other Ambulatory Visit: Payer: Self-pay

## 2020-05-14 ENCOUNTER — Inpatient Hospital Stay: Payer: 59

## 2020-05-14 ENCOUNTER — Encounter: Payer: Self-pay | Admitting: Physician Assistant

## 2020-05-14 VITALS — BP 128/82 | HR 98 | Temp 97.8°F | Resp 18 | Ht 71.0 in | Wt 269.2 lb

## 2020-05-14 DIAGNOSIS — C3491 Malignant neoplasm of unspecified part of right bronchus or lung: Secondary | ICD-10-CM

## 2020-05-14 DIAGNOSIS — C7951 Secondary malignant neoplasm of bone: Secondary | ICD-10-CM

## 2020-05-14 DIAGNOSIS — Z95828 Presence of other vascular implants and grafts: Secondary | ICD-10-CM

## 2020-05-14 DIAGNOSIS — Z5112 Encounter for antineoplastic immunotherapy: Secondary | ICD-10-CM

## 2020-05-14 LAB — CBC WITH DIFFERENTIAL (CANCER CENTER ONLY)
Abs Immature Granulocytes: 0.03 10*3/uL (ref 0.00–0.07)
Basophils Absolute: 0 10*3/uL (ref 0.0–0.1)
Basophils Relative: 0 %
Eosinophils Absolute: 0.1 10*3/uL (ref 0.0–0.5)
Eosinophils Relative: 1 %
HCT: 40.8 % (ref 39.0–52.0)
Hemoglobin: 13.5 g/dL (ref 13.0–17.0)
Immature Granulocytes: 0 %
Lymphocytes Relative: 22 %
Lymphs Abs: 2.1 10*3/uL (ref 0.7–4.0)
MCH: 32 pg (ref 26.0–34.0)
MCHC: 33.1 g/dL (ref 30.0–36.0)
MCV: 96.7 fL (ref 80.0–100.0)
Monocytes Absolute: 1 10*3/uL (ref 0.1–1.0)
Monocytes Relative: 10 %
Neutro Abs: 6.3 10*3/uL (ref 1.7–7.7)
Neutrophils Relative %: 67 %
Platelet Count: 247 10*3/uL (ref 150–400)
RBC: 4.22 MIL/uL (ref 4.22–5.81)
RDW: 14.2 % (ref 11.5–15.5)
WBC Count: 9.6 10*3/uL (ref 4.0–10.5)
nRBC: 0 % (ref 0.0–0.2)

## 2020-05-14 LAB — CMP (CANCER CENTER ONLY)
ALT: 38 U/L (ref 0–44)
AST: 27 U/L (ref 15–41)
Albumin: 3.9 g/dL (ref 3.5–5.0)
Alkaline Phosphatase: 91 U/L (ref 38–126)
Anion gap: 11 (ref 5–15)
BUN: 11 mg/dL (ref 6–20)
CO2: 25 mmol/L (ref 22–32)
Calcium: 10.3 mg/dL (ref 8.9–10.3)
Chloride: 103 mmol/L (ref 98–111)
Creatinine: 0.8 mg/dL (ref 0.61–1.24)
GFR, Est AFR Am: >60 mL/min (ref >60)
GFR, Estimated: >60 mL/min (ref >60)
Glucose, Bld: 96 mg/dL (ref 70–99)
Potassium: 4.2 mmol/L (ref 3.5–5.1)
Sodium: 139 mmol/L (ref 135–145)
Total Bilirubin: 0.3 mg/dL (ref 0.3–1.2)
Total Protein: 7.5 g/dL (ref 6.5–8.1)

## 2020-05-14 LAB — TSH: TSH: 3.195 u[IU]/mL (ref 0.320–4.118)

## 2020-05-14 MED ORDER — SODIUM CHLORIDE 0.9% FLUSH
10.0000 mL | Freq: Once | INTRAVENOUS | Status: AC
  Administered 2020-05-14: 10 mL
  Filled 2020-05-14: qty 10

## 2020-05-14 MED ORDER — OXYCODONE-ACETAMINOPHEN 5-325 MG PO TABS: 1.0000 | ORAL_TABLET | Freq: Four times a day (QID) | ORAL | 0 refills | Status: DC | PRN

## 2020-05-14 MED ORDER — SODIUM CHLORIDE 0.9 % IV SOLN
Freq: Once | INTRAVENOUS | Status: AC
  Filled 2020-05-14: qty 250

## 2020-05-14 MED ORDER — HEPARIN SOD (PORK) LOCK FLUSH 100 UNIT/ML IV SOLN
500.0000 [IU] | Freq: Once | INTRAVENOUS | Status: AC | PRN
  Administered 2020-05-14: 500 [IU]
  Filled 2020-05-14: qty 5

## 2020-05-14 MED ORDER — SODIUM CHLORIDE 0.9% FLUSH
10.0000 mL | INTRAVENOUS | Status: DC | PRN
  Administered 2020-05-14: 10 mL
  Filled 2020-05-14: qty 10

## 2020-05-14 MED ORDER — SODIUM CHLORIDE 0.9 % IV SOLN
200.0000 mg | Freq: Once | INTRAVENOUS | Status: AC
  Administered 2020-05-14: 200 mg via INTRAVENOUS
  Filled 2020-05-14: qty 8

## 2020-05-14 NOTE — Patient Instructions (Signed)

## 2020-05-14 NOTE — Patient Instructions (Signed)
[facility] Discharge Instructions for Patients Receiving Chemotherapy  Today you received the following chemotherapy agents:  Keytruda.  To help prevent nausea and vomiting after your treatment, we encourage you to take your nausea medication as directed.   If you develop nausea and vomiting that is not controlled by your nausea medication, call the clinic.   BELOW ARE SYMPTOMS THAT SHOULD BE REPORTED IMMEDIATELY:  *FEVER GREATER THAN 100.5 F  *CHILLS WITH OR WITHOUT FEVER  NAUSEA AND VOMITING THAT IS NOT CONTROLLED WITH YOUR NAUSEA MEDICATION  *UNUSUAL SHORTNESS OF BREATH  *UNUSUAL BRUISING OR BLEEDING  TENDERNESS IN MOUTH AND THROAT WITH OR WITHOUT PRESENCE OF ULCERS  *URINARY PROBLEMS  *BOWEL PROBLEMS  UNUSUAL RASH Items with * indicate a potential emergency and should be followed up as soon as possible.  Feel free to call the clinic should you have any questions or concerns. The clinic phone number is [phone].  Please show the CHEMO ALERT CARD at check-in to the Emergency Department and triage nurse.    

## 2020-05-21 ENCOUNTER — Other Ambulatory Visit: Payer: Self-pay | Admitting: Internal Medicine

## 2020-05-21 ENCOUNTER — Other Ambulatory Visit: Payer: Self-pay | Admitting: Family Medicine

## 2020-05-21 ENCOUNTER — Encounter: Payer: Self-pay | Admitting: Internal Medicine

## 2020-05-21 MED ORDER — APIXABAN 5 MG PO TABS: 5.0000 mg | ORAL_TABLET | Freq: Two times a day (BID) | ORAL | 3 refills | Status: DC

## 2020-05-21 NOTE — Progress Notes (Signed)
Received refill request from onc.  Will refill.

## 2020-06-06 ENCOUNTER — Ambulatory Visit (HOSPITAL_COMMUNITY)
Admission: RE | Admit: 2020-06-06 | Discharge: 2020-06-06 | Disposition: A | Discharge status: Home / Self Care | Payer: 59 | Source: Ambulatory Visit | Attending: Physician Assistant | Admitting: Physician Assistant

## 2020-06-06 ENCOUNTER — Other Ambulatory Visit: Payer: Self-pay

## 2020-06-06 ENCOUNTER — Encounter: Payer: Self-pay | Admitting: Family Medicine

## 2020-06-06 DIAGNOSIS — C3491 Malignant neoplasm of unspecified part of right bronchus or lung: Secondary | ICD-10-CM | POA: Insufficient documentation

## 2020-06-06 MED ORDER — SODIUM CHLORIDE (PF) 0.9 % IJ SOLN
INTRAMUSCULAR | Status: AC
  Filled 2020-06-06: qty 50

## 2020-06-06 MED ORDER — IOHEXOL 300 MG/ML  SOLN
100.0000 mL | Freq: Once | INTRAMUSCULAR | Status: AC | PRN
  Administered 2020-06-06: 100 mL via INTRAVENOUS

## 2020-06-08 MED ORDER — SILDENAFIL CITRATE 20 MG PO TABS: 20.0000 mg | ORAL_TABLET | Freq: Every day | ORAL | 3 refills | Status: AC | PRN

## 2020-06-08 NOTE — Telephone Encounter (Signed)
Refilled

## 2020-06-10 ENCOUNTER — Inpatient Hospital Stay: Payer: 59

## 2020-06-10 ENCOUNTER — Inpatient Hospital Stay (HOSPITAL_BASED_OUTPATIENT_CLINIC_OR_DEPARTMENT_OTHER): Payer: 59 | Admitting: Internal Medicine

## 2020-06-10 ENCOUNTER — Other Ambulatory Visit: Payer: Self-pay

## 2020-06-10 ENCOUNTER — Inpatient Hospital Stay: Payer: 59 | Attending: Physician Assistant

## 2020-06-10 ENCOUNTER — Encounter: Payer: Self-pay | Admitting: Internal Medicine

## 2020-06-10 VITALS — BP 121/66 | HR 93 | Temp 98.1°F | Resp 17 | Ht 71.0 in | Wt 272.2 lb

## 2020-06-10 DIAGNOSIS — C3491 Malignant neoplasm of unspecified part of right bronchus or lung: Secondary | ICD-10-CM | POA: Diagnosis not present

## 2020-06-10 DIAGNOSIS — Z5112 Encounter for antineoplastic immunotherapy: Secondary | ICD-10-CM | POA: Insufficient documentation

## 2020-06-10 DIAGNOSIS — Z7901 Long term (current) use of anticoagulants: Secondary | ICD-10-CM | POA: Insufficient documentation

## 2020-06-10 DIAGNOSIS — Z79899 Other long term (current) drug therapy: Secondary | ICD-10-CM | POA: Diagnosis not present

## 2020-06-10 DIAGNOSIS — R5383 Other fatigue: Secondary | ICD-10-CM | POA: Diagnosis not present

## 2020-06-10 DIAGNOSIS — G4733 Obstructive sleep apnea (adult) (pediatric): Secondary | ICD-10-CM | POA: Insufficient documentation

## 2020-06-10 DIAGNOSIS — K219 Gastro-esophageal reflux disease without esophagitis: Secondary | ICD-10-CM | POA: Diagnosis not present

## 2020-06-10 DIAGNOSIS — C7951 Secondary malignant neoplasm of bone: Secondary | ICD-10-CM

## 2020-06-10 DIAGNOSIS — C3411 Malignant neoplasm of upper lobe, right bronchus or lung: Secondary | ICD-10-CM | POA: Insufficient documentation

## 2020-06-10 DIAGNOSIS — Z9221 Personal history of antineoplastic chemotherapy: Secondary | ICD-10-CM | POA: Diagnosis not present

## 2020-06-10 DIAGNOSIS — M1990 Unspecified osteoarthritis, unspecified site: Secondary | ICD-10-CM | POA: Insufficient documentation

## 2020-06-10 DIAGNOSIS — C7989 Secondary malignant neoplasm of other specified sites: Secondary | ICD-10-CM | POA: Diagnosis not present

## 2020-06-10 DIAGNOSIS — K760 Fatty (change of) liver, not elsewhere classified: Secondary | ICD-10-CM | POA: Insufficient documentation

## 2020-06-10 DIAGNOSIS — Z95828 Presence of other vascular implants and grafts: Secondary | ICD-10-CM

## 2020-06-10 DIAGNOSIS — E7800 Pure hypercholesterolemia, unspecified: Secondary | ICD-10-CM | POA: Insufficient documentation

## 2020-06-10 DIAGNOSIS — Z7984 Long term (current) use of oral hypoglycemic drugs: Secondary | ICD-10-CM | POA: Insufficient documentation

## 2020-06-10 DIAGNOSIS — E119 Type 2 diabetes mellitus without complications: Secondary | ICD-10-CM | POA: Insufficient documentation

## 2020-06-10 DIAGNOSIS — C781 Secondary malignant neoplasm of mediastinum: Secondary | ICD-10-CM | POA: Diagnosis not present

## 2020-06-10 LAB — CMP (CANCER CENTER ONLY)
ALT: 35 U/L (ref 0–44)
AST: 28 U/L (ref 15–41)
Albumin: 3.4 g/dL — ABNORMAL LOW (ref 3.5–5.0)
Alkaline Phosphatase: 84 U/L (ref 38–126)
Anion gap: 9 (ref 5–15)
BUN: 12 mg/dL (ref 6–20)
CO2: 28 mmol/L (ref 22–32)
Calcium: 9.7 mg/dL (ref 8.9–10.3)
Chloride: 101 mmol/L (ref 98–111)
Creatinine: 0.81 mg/dL (ref 0.61–1.24)
GFR, Est AFR Am: >60 mL/min (ref >60)
GFR, Estimated: >60 mL/min (ref >60)
Glucose, Bld: 206 mg/dL — ABNORMAL HIGH (ref 70–99)
Potassium: 4 mmol/L (ref 3.5–5.1)
Sodium: 138 mmol/L (ref 135–145)
Total Bilirubin: 0.3 mg/dL (ref 0.3–1.2)
Total Protein: 7.1 g/dL (ref 6.5–8.1)

## 2020-06-10 LAB — CBC WITH DIFFERENTIAL (CANCER CENTER ONLY)
Abs Immature Granulocytes: 0.03 10*3/uL (ref 0.00–0.07)
Basophils Absolute: 0.1 10*3/uL (ref 0.0–0.1)
Basophils Relative: 1 %
Eosinophils Absolute: 0.1 10*3/uL (ref 0.0–0.5)
Eosinophils Relative: 1 %
HCT: 39.2 % (ref 39.0–52.0)
Hemoglobin: 12.8 g/dL — ABNORMAL LOW (ref 13.0–17.0)
Immature Granulocytes: 0 %
Lymphocytes Relative: 18 %
Lymphs Abs: 1.6 10*3/uL (ref 0.7–4.0)
MCH: 31.3 pg (ref 26.0–34.0)
MCHC: 32.7 g/dL (ref 30.0–36.0)
MCV: 95.8 fL (ref 80.0–100.0)
Monocytes Absolute: 0.8 10*3/uL (ref 0.1–1.0)
Monocytes Relative: 9 %
Neutro Abs: 6.5 10*3/uL (ref 1.7–7.7)
Neutrophils Relative %: 71 %
Platelet Count: 237 10*3/uL (ref 150–400)
RBC: 4.09 MIL/uL — ABNORMAL LOW (ref 4.22–5.81)
RDW: 12.7 % (ref 11.5–15.5)
WBC Count: 9.1 10*3/uL (ref 4.0–10.5)
nRBC: 0 % (ref 0.0–0.2)

## 2020-06-10 LAB — TSH: TSH: 3.561 u[IU]/mL (ref 0.320–4.118)

## 2020-06-10 MED ORDER — SODIUM CHLORIDE 0.9% FLUSH
10.0000 mL | Freq: Once | INTRAVENOUS | Status: AC
  Administered 2020-06-10: 10 mL
  Filled 2020-06-10: qty 10

## 2020-06-10 MED ORDER — HEPARIN SOD (PORK) LOCK FLUSH 100 UNIT/ML IV SOLN
500.0000 [IU] | Freq: Once | INTRAVENOUS | Status: AC | PRN
  Administered 2020-06-10: 500 [IU]
  Filled 2020-06-10: qty 5

## 2020-06-10 MED ORDER — SODIUM CHLORIDE 0.9% FLUSH
10.0000 mL | INTRAVENOUS | Status: DC | PRN
  Administered 2020-06-10: 10 mL
  Filled 2020-06-10: qty 10

## 2020-06-10 MED ORDER — SODIUM CHLORIDE 0.9 % IV SOLN
Freq: Once | INTRAVENOUS | Status: AC
  Filled 2020-06-10: qty 250

## 2020-06-10 MED ORDER — SODIUM CHLORIDE 0.9 % IV SOLN
200.0000 mg | Freq: Once | INTRAVENOUS | Status: AC
  Administered 2020-06-10: 200 mg via INTRAVENOUS
  Filled 2020-06-10: qty 8

## 2020-06-10 NOTE — Patient Instructions (Signed)
[facility] Discharge Instructions for Patients Receiving Chemotherapy  Today you received the following chemotherapy agents:  Keytruda.  To help prevent nausea and vomiting after your treatment, we encourage you to take your nausea medication as directed.   If you develop nausea and vomiting that is not controlled by your nausea medication, call the clinic.   BELOW ARE SYMPTOMS THAT SHOULD BE REPORTED IMMEDIATELY:  *FEVER GREATER THAN 100.5 F  *CHILLS WITH OR WITHOUT FEVER  NAUSEA AND VOMITING THAT IS NOT CONTROLLED WITH YOUR NAUSEA MEDICATION  *UNUSUAL SHORTNESS OF BREATH  *UNUSUAL BRUISING OR BLEEDING  TENDERNESS IN MOUTH AND THROAT WITH OR WITHOUT PRESENCE OF ULCERS  *URINARY PROBLEMS  *BOWEL PROBLEMS  UNUSUAL RASH Items with * indicate a potential emergency and should be followed up as soon as possible.  Feel free to call the clinic should you have any questions or concerns. The clinic phone number is [phone].  Please show the CHEMO ALERT CARD at check-in to the Emergency Department and triage nurse.    

## 2020-06-10 NOTE — Progress Notes (Signed)
[facility] [phone]   [phone]  OFFICE PROGRESS NOTE  [name], MD [place] [address]  DIAGNOSIS: Stage IV (T2b, N3, M1C) non-small cell lung cancer, squamous cell carcinoma presented with right upper lobe lung mass in addition to mediastinal and right supraclavicular lymphadenopathy as well as multiple metastatic bone lesions diagnosed in March 2021.  PRIOR THERAPY: Palliative radiotherapy to the painful metastatic bone lesions under the care of Dr. Sondra Come. Last dose 01/14/20  CURRENT THERAPY: Palliative systemic chemotherapy withcarboplatin for AUC of 5, paclitaxel 175 NG/M2 and Keytruda 200 mg IV every 3 weeks with Neulasta support.First dose 01/03/20. Status post6cycles.Starting from cycle #5 he will be on maintenance single agent Keytruda  INTERVAL HISTORY: Mike [name] 54 y.o. male returns to the clinic today for follow-up visit.  The patient is feeling fine today with no concerning complaints except for pain on the right side of the rib cage posteriorly.  He was exercising and he thinks that he has fracture of the rib which was seen on the recent scan.  He denied having any shortness of breath, cough or hemoptysis.  He denied having any fever or chills.  He has no nausea, vomiting, diarrhea or constipation.  He denied having any headache or visual changes.  He continues to tolerate his treatment with Keytruda fairly well.  The patient had repeat CT scan of the chest, abdomen pelvis performed recently and is here for evaluation and discussion of his discuss results.  MEDICAL HISTORY: Past Medical History:  Diagnosis Date  . Arthritis    neck  . Cancer, metastatic to bone ([facility]) dx'd 10/2019  . Diabetes ([facility])   . Fatty liver 12/05/2019   By [place] 11/2019  . GERD (gastroesophageal reflux disease)   . 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 High cholesterol   . IBS (irritable bowel syndrome)     . lung ca dx'd 10/2019   lung stage 4   . OSA (obstructive sleep apnea) 09/15/2019   Sleep study 12/2017 - severe OSA with AHI 65.6, desat to 76% rec CPAP  . Severe obesity (BMI 35.0-39.9) with comorbidity ([facility]) 09/04/2019    ALLERGIES:  has No Known Allergies.  MEDICATIONS:  Current Outpatient Medications  Medication Sig Dispense Refill  . albuterol (PROVENTIL) (2.5 MG/3ML) 0.083% nebulizer solution Take 3 mLs (2.5 mg total) by nebulization every 6 (six) hours as needed for wheezing or shortness of breath. 75 mL 12  . apixaban (ELIQUIS) 5 MG TABS tablet Take 1 tablet (5 mg total) by mouth 2 (two) times daily. 60 tablet 3  . atorvastatin (LIPITOR) 20 MG tablet Take 1 tablet (20 mg total) by mouth at bedtime. 90 tablet 3  . diazepam (VALIUM) 5 MG tablet Take 1 tablet (5 mg total) by mouth every 12 (twelve) hours as needed for anxiety or muscle spasms. 30 tablet 0  . lidocaine-prilocaine (EMLA) cream Apply to the Port-A-Cath site 30 minutes before treatment 30 g 0  . metFORMIN (GLUCOPHAGE) 1000 MG tablet Take 1 tablet (1,000 mg total) by mouth 2 (two) times daily with a meal. 180 tablet 3  . omeprazole (PRILOSEC) 40 MG capsule Take 1 capsule (40 mg total) by mouth daily. For 3 weeks then as needed 30 capsule 3  . oxyCODONE-acetaminophen (PERCOCET/ROXICET) 5-325 MG tablet Take 1 tablet by mouth every 6 (six) hours as needed for severe pain. 40 tablet 0  . sildenafil (  REVATIO) 20 MG tablet Take 1-5 tablets (20-100 mg total) by mouth daily as needed (ED). 30 tablet 3   No current facility-administered medications for this visit.    SURGICAL HISTORY:  Past Surgical History:  Procedure Laterality Date  . BIOPSY  12/18/2019   Procedure: BIOPSY;  Surgeon: Garner Nash, DO;  Location: [facility];  Service: Pulmonary;;  . BRONCHIAL BRUSHINGS  12/18/2019   Procedure: BRONCHIAL BRUSHINGS;  Surgeon: Garner Nash, DO;  Location: [facility];  Service: Pulmonary;;  . BRONCHIAL WASHINGS   12/18/2019   Procedure: BRONCHIAL WASHINGS;  Surgeon: Garner Nash, DO;  Location: [facility];  Service: Pulmonary;;  . COLONOSCOPY  10/2019   mult TA, int hem, rpt 1 yr ([facility])  . CYSTECTOMY     knee- left knee cyst  . ENDOBRONCHIAL ULTRASOUND  12/18/2019   Procedure: ENDOBRONCHIAL ULTRASOUND;  Surgeon: Garner Nash, DO;  Location: [facility];  Service: Pulmonary;;  . FINE NEEDLE ASPIRATION  12/18/2019   Procedure: FINE NEEDLE ASPIRATION;  Surgeon: Garner Nash, DO;  Location: [facility];  Service: Pulmonary;;  . IR IMAGING GUIDED PORT INSERTION  12/27/2019  . PILONIDAL CYST EXCISION    . VIDEO BRONCHOSCOPY WITH ENDOBRONCHIAL ULTRASOUND Right 12/18/2019   Procedure: VIDEO BRONCHOSCOPY;  Surgeon: Garner Nash, DO;  Location: [facility];  Service: Pulmonary;  Laterality: Right;    REVIEW OF SYSTEMS:  Constitutional: positive for fatigue Eyes: negative Ears, nose, mouth, throat, and face: negative Respiratory: positive for pleurisy/chest pain Cardiovascular: negative Gastrointestinal: negative Genitourinary:negative Integument/breast: negative Hematologic/lymphatic: negative Musculoskeletal:negative Neurological: negative Behavioral/Psych: negative Endocrine: negative Allergic/Immunologic: negative   PHYSICAL EXAMINATION: General appearance: alert, cooperative, fatigued and no distress Head: Normocephalic, without obvious abnormality, atraumatic Neck: no adenopathy, no JVD, supple, symmetrical, trachea midline and thyroid not enlarged, symmetric, no tenderness/mass/nodules Lymph nodes: Cervical, supraclavicular, and axillary nodes normal. Resp: clear to auscultation bilaterally Back: symmetric, no curvature. ROM normal. No CVA tenderness. Cardio: regular rate and rhythm, S1, S2 normal, no murmur, click, rub or gallop GI: soft, non-tender; bowel sounds normal; no masses,  no organomegaly Extremities: extremities normal, atraumatic, no cyanosis or  edema Neurologic: Alert and oriented X 3, normal strength and tone. Normal symmetric reflexes. Normal coordination and gait  ECOG PERFORMANCE STATUS: 1 - Symptomatic but completely ambulatory  Blood pressure 121/66, pulse 93, temperature 98.1 F (36.7 C), temperature source Tympanic, resp. rate 17, height 5\' 11"  (1.803 m), weight 272 lb 3.2 oz (123.5 kg), SpO2 97 %.  LABORATORY DATA: Lab Results  Component Value Date   WBC 9.1 06/10/2020   HGB 12.8 (L) 06/10/2020   HCT 39.2 06/10/2020   MCV 95.8 06/10/2020   PLT 237 06/10/2020      Chemistry      Component Value Date/Time   NA 139 05/14/2020 1357   K 4.2 05/14/2020 1357   CL 103 05/14/2020 1357   CO2 25 05/14/2020 1357   BUN 11 05/14/2020 1357   CREATININE 0.80 05/14/2020 1357      Component Value Date/Time   CALCIUM 10.3 05/14/2020 1357   ALKPHOS 91 05/14/2020 1357   AST 27 05/14/2020 1357   ALT 38 05/14/2020 1357   BILITOT 0.3 05/14/2020 1357       RADIOGRAPHIC STUDIES: CT Chest W Contrast  Result Date: 06/06/2020 CLINICAL DATA:  Metastatic squamous cell cancer of the right lung,, restaging, assess treatment response EXAM: CT CHEST, ABDOMEN, AND PELVIS WITH CONTRAST TECHNIQUE: Multidetector CT imaging of the chest, abdomen and pelvis was performed  following the standard protocol during bolus administration of intravenous contrast. CONTRAST:  117mL OMNIPAQUE IOHEXOL 300 MG/ML SOLN, additional oral enteric contrast COMPARISON:  CT chest abdomen pelvis, 03/18/2020, PET-CT, 12/20/2019 FINDINGS: CT CHEST FINDINGS Cardiovascular: Left chest port catheter. Aortic atherosclerosis. Normal heart size. Left coronary artery calcifications. No pericardial effusion. There is at least some, band and web-like chronic embolus present in the lobar pulmonary arteries and more distal branches, not optimally evaluated on this examination (e.g. Series 2, image 31). Mediastinum/Nodes: Interval enlargement of right hilar lymph nodes, largest node  measuring 2.0 x 2.0 cm, previously 1.6 x 1.4 cm (series 2, image 33). No change in prominent pretracheal and subcarinal lymph nodes. Thyroid gland, trachea, and esophagus demonstrate no significant findings. Lungs/Pleura: Interval increase in size of adjacent spiculated nodules of the central right upper lobe, largest nodule measuring approximately 2.2 x 1.6 cm, previously 1.4 x 1.1 cm when measured similarly (series 6, image 55). No pleural effusion or pneumothorax. Small, right-sided fat containing Bochdalek's hernia. Musculoskeletal: Interval increase in lytic component of a mixed lytic and sclerotic lesion of the sternal body (series 5, image 108). There is a new, nondisplaced fracture through a sclerotic lesion of the right eighth rib (series 2, image 45). CT ABDOMEN PELVIS FINDINGS Hepatobiliary: Hepatomegaly and hepatic steatosis. No gallstones, gallbladder wall thickening, or biliary dilatation. Pancreas: Unremarkable. No pancreatic ductal dilatation or surrounding inflammatory changes. Spleen: Normal in size without significant abnormality. Adrenals/Urinary Tract: Unchanged subcentimeter nodule of the medial limb of the right adrenal gland, not previously FDG avid and likely an incidental small adenoma (series 2, image 60). Kidneys are normal, without renal calculi, solid lesion, or hydronephrosis. Bladder is unremarkable. Stomach/Bowel: Stomach is within normal limits. Appendix appears normal. No evidence of bowel wall thickening, distention, or inflammatory changes. Vascular/Lymphatic: Aortic atherosclerosis. No enlarged abdominal or pelvic lymph nodes. Reproductive: No mass or other abnormality. Other: No abdominal wall hernia or abnormality. No abdominopelvic ascites. Musculoskeletal: No significant change in a lytic lesion of the posterior right acetabulum (series 2, image 123). IMPRESSION: 1. Interval increase in size of adjacent spiculated nodules of the central right upper lobe. 2. Interval  enlargement of right hilar lymph nodes. Additional mediastinal lymph nodes are unchanged. 3. Interval increase in lytic component of a mixed lytic and sclerotic lesion of the sternal body. There is a new, nondisplaced fracture through a sclerotic lesion of the right eighth rib. Unchanged lytic lesion of the posterior right acetabulum. 4. Overall constellation of findings is consistent with worsened right lung malignancy and metastatic disease. 5. There is at least some, band and web-like chronic embolus present in the lobar pulmonary arteries and more distal branches, not optimally evaluated on this non tailored, non angiographic examination. 6. No change in a subcentimeter nodule of the medial limb of the right adrenal gland, not previously FDG avid and very likely an incidental small adenoma. Attention on follow-up. 7. Hepatomegaly and hepatic steatosis. 8. Coronary artery disease.  Aortic Atherosclerosis (ICD10-I70.0). Electronically Signed   By: [name] Candle M.D.   On: 06/06/2020 20:31   CT Abdomen Pelvis W Contrast  Result Date: 06/06/2020 CLINICAL DATA:  Metastatic squamous cell cancer of the right lung,, restaging, assess treatment response EXAM: CT CHEST, ABDOMEN, AND PELVIS WITH CONTRAST TECHNIQUE: Multidetector CT imaging of the chest, abdomen and pelvis was performed following the standard protocol during bolus administration of intravenous contrast. CONTRAST:  18mL OMNIPAQUE IOHEXOL 300 MG/ML SOLN, additional oral enteric contrast COMPARISON:  CT chest abdomen pelvis, 03/18/2020, PET-CT, 12/20/2019  FINDINGS: CT CHEST FINDINGS Cardiovascular: Left chest port catheter. Aortic atherosclerosis. Normal heart size. Left coronary artery calcifications. No pericardial effusion. There is at least some, band and web-like chronic embolus present in the lobar pulmonary arteries and more distal branches, not optimally evaluated on this examination (e.g. Series 2, image 31). Mediastinum/Nodes: Interval  enlargement of right hilar lymph nodes, largest node measuring 2.0 x 2.0 cm, previously 1.6 x 1.4 cm (series 2, image 33). No change in prominent pretracheal and subcarinal lymph nodes. Thyroid gland, trachea, and esophagus demonstrate no significant findings. Lungs/Pleura: Interval increase in size of adjacent spiculated nodules of the central right upper lobe, largest nodule measuring approximately 2.2 x 1.6 cm, previously 1.4 x 1.1 cm when measured similarly (series 6, image 55). No pleural effusion or pneumothorax. Small, right-sided fat containing Bochdalek's hernia. Musculoskeletal: Interval increase in lytic component of a mixed lytic and sclerotic lesion of the sternal body (series 5, image 108). There is a new, nondisplaced fracture through a sclerotic lesion of the right eighth rib (series 2, image 45). CT ABDOMEN PELVIS FINDINGS Hepatobiliary: Hepatomegaly and hepatic steatosis. No gallstones, gallbladder wall thickening, or biliary dilatation. Pancreas: Unremarkable. No pancreatic ductal dilatation or surrounding inflammatory changes. Spleen: Normal in size without significant abnormality. Adrenals/Urinary Tract: Unchanged subcentimeter nodule of the medial limb of the right adrenal gland, not previously FDG avid and likely an incidental small adenoma (series 2, image 60). Kidneys are normal, without renal calculi, solid lesion, or hydronephrosis. Bladder is unremarkable. Stomach/Bowel: Stomach is within normal limits. Appendix appears normal. No evidence of bowel wall thickening, distention, or inflammatory changes. Vascular/Lymphatic: Aortic atherosclerosis. No enlarged abdominal or pelvic lymph nodes. Reproductive: No mass or other abnormality. Other: No abdominal wall hernia or abnormality. No abdominopelvic ascites. Musculoskeletal: No significant change in a lytic lesion of the posterior right acetabulum (series 2, image 123). IMPRESSION: 1. Interval increase in size of adjacent spiculated nodules  of the central right upper lobe. 2. Interval enlargement of right hilar lymph nodes. Additional mediastinal lymph nodes are unchanged. 3. Interval increase in lytic component of a mixed lytic and sclerotic lesion of the sternal body. There is a new, nondisplaced fracture through a sclerotic lesion of the right eighth rib. Unchanged lytic lesion of the posterior right acetabulum. 4. Overall constellation of findings is consistent with worsened right lung malignancy and metastatic disease. 5. There is at least some, band and web-like chronic embolus present in the lobar pulmonary arteries and more distal branches, not optimally evaluated on this non tailored, non angiographic examination. 6. No change in a subcentimeter nodule of the medial limb of the right adrenal gland, not previously FDG avid and very likely an incidental small adenoma. Attention on follow-up. 7. Hepatomegaly and hepatic steatosis. 8. Coronary artery disease.  Aortic Atherosclerosis (ICD10-I70.0). Electronically Signed   By: [name] Candle M.D.   On: 06/06/2020 20:31    ASSESSMENT AND PLAN: This is a very pleasant 54 years old white male with a stage IV non-small cell lung cancer, squamous cell carcinoma presented with right upper lobe lung mass in addition to mediastinal and right supraclavicular lymphadenopathy and multiple metastatic bone lesions diagnosed in March 2021 status post palliative radiotherapy to the painful metastatic lesions under the care of Dr. Sondra Come. The patient is currently undergoing palliative systemic chemotherapy initially with carboplatin, paclitaxel and Keytruda for 4 cycles and he is currently on the maintenance phase of his treatment with single agent Keytruda status post 2 more cycles. The patient has been tolerating  this treatment well with no concerning adverse effects. He had repeat CT scan of the chest, abdomen pelvis performed recently.  The scan showed interval increase in the size of the adjacent  spiculated nodules in the central right upper lobe as well as interval enlargement of right hilar lymph nodes and some progressive bone lesions. I personally and independently reviewed the scan images and discussed the results with the patient today. I recommend for the patient to see Dr. Sondra Come for consideration of palliative radiotherapy to the enlarging lesions in the right upper lobe and right hilar lymph nodes as well as the painful lesion in the right eighth rib. I will continue his current treatment with immunotherapy for now and if there is any further progression on the upcoming imaging studies, I would consider switching his treatment to systemic chemotherapy with docetaxel and Cyramza. The patient agreed to the current plan. For pain management I will give him refill of Percocet. The patient will come back for follow-up visit in 3 weeks for evaluation before the next cycle of his treatment. He was advised to call immediately if he has any concerning symptoms in the interval. The patient voices understanding of current disease status and treatment options and is in agreement with the current care plan.  All questions were answered. The patient knows to call the clinic with any problems, questions or concerns. We can certainly see the patient much sooner if necessary.  Disclaimer: This note was dictated with voice recognition software. Similar sounding words can inadvertently be transcribed and may not be corrected upon review.

## 2020-06-11 ENCOUNTER — Telehealth: Payer: Self-pay | Admitting: Internal Medicine

## 2020-06-11 NOTE — Telephone Encounter (Signed)
Scheduled per los. Called and left msg. Informed patient schedule was off and corrected all appts. Mailed printout

## 2020-06-16 NOTE — Progress Notes (Signed)
Radiation Oncology         [phone] ________________________________  Name: Mike [name] MRN: [id number]  Date: 06/18/2020  DOB: 21-Jun-1966  Re-Evaluation Note  CC: [name], MD  [name], MD    ICD-10-CM   1. Stage IV squamous cell carcinoma of right lung [facility])  C34.91   2. Bony metastasis (HCC)  C79.51     Diagnosis: Stage IV (T2b, N3, M1C) non-small cell lung cancer, squamous cell carcinoma presented with right upper lobe lung mass in addition to mediastinal and right supraclavicular lymphadenopathy as well as multiple metastatic bone lesions  Narrative:  The patient returns today to discuss radiation treatment options. He was originally seen in consultation on 12/26/2019 and underwent radiation therapy directed at three different areas including the sternum, right rib cage, and right pelvis as noted being active on the PET scan and were causing pain. He received 30 Gy in 10 fractions from 01/01/2020 - 01/14/2020. After the completion of palliative radiation therapy, he was seen in follow-up on 02/14/2020. He had reported significant benefit from therapy and was walking much better. He continued to have some pain in the right hip but had improved overall. The other sites of metastasis that were treated with radiation therapy were no longer painful. He was being followed closely by medical oncology and was to follow-up with radiation oncology as needed.  Since his last visit, he continued on palliative systemic chemotherapy with Carboplatin, Paclitaxel, and Keytruda with Neulasta support under the care of Dr. Julien Nordmann and [name], PA-C. His first dose was given on 01/03/2020 and he completed four cycles. He tolerated treatment relatively well with the exception of sore gums.  He underwent restaging CT of chest, abdomen, and pelvis on 03/18/2020. Results showed interval decrease in size of the right upper lobe mass and mediastinal/right hilar  lymphadenopathy. The hypermetabolic bone metastases seen in the sternum, right eighth rib, and acetabulum were stable to decreased in size since 12/20/2019. There was no evidence of new or progressive findings in the chest, abdomen, or pelvis. Finally, there was a substantial interval decrease in burden of pulmonary embolus, although there was residual non-obstructive embolic disease in the right main/interlobar pulmonary arteries and left lower lobe pulmonary artery.  Of note, starting from cycle #5 on 04/23/2020, the patient was placed on maintenance single agent Keytruda.  CT of chest, abdomen, and pelvis on 06/06/2020 showed interval increase in size of adjacent spiculated nodules in the central right upper lobe. There was also interval enlargement of right hilar lymph nodes. Additional mediastinal lymph nodes were unchanged. Additionally, there was interval increase in the lytic component of a mixed lytic and sclerotic lesion of the sternal body in addition to a new, non-displaced fracture through a sclerotic lesion of the right eighth rib. The lytic lesion of the posterior right acetabulum was unchanged. Overall constellation of findings was consistent with worsened right lung malignancy and metastatic disease. Finally, there was at least some band and web-like chronic embolus present in the lobar pulmonary arteries and more distal branches and the sub-centimeter nodule of the medial limb of the right adrenal gland was unchanged.  The patient was last seen by Dr. Julien Nordmann on 06/10/2020, during which time he had completed six cycles of palliative systemic chemotherapy. Given the recent CT findings, he was referred back to me for consideration of palliative radiotherapy to the enlarging lesions in the right upper lobe, the right hilar lymph nodes, and the painful right eighth rib lesion. He continues  on immunotherapy for now and if there is any further progression on the upcoming imaging studies, then his  treatment may be switched to systemic chemotherapy with Docetaxel and Cyramza.  On review of systems, the patient reports right rib pain and left hip pain. He denies changes in weight, shortness of breath, cough, hemoptysis, and any other symptoms.  Left hip began hurting 2 to 3 weeks ago after he heard a popping sensation however the CT scan after this incident and showed no new changes in the left pelvis.   Allergies:  has No Known Allergies.  Meds: Current Outpatient Medications  Medication Sig Dispense Refill   albuterol (PROVENTIL) (2.5 MG/3ML) 0.083% nebulizer solution Take 3 mLs (2.5 mg total) by nebulization every 6 (six) hours as needed for wheezing or shortness of breath. 75 mL 12   apixaban (ELIQUIS) 5 MG TABS tablet Take 1 tablet (5 mg total) by mouth 2 (two) times daily. 60 tablet 3   atorvastatin (LIPITOR) 20 MG tablet Take 1 tablet (20 mg total) by mouth at bedtime. 90 tablet 3   diazepam (VALIUM) 5 MG tablet Take 1 tablet (5 mg total) by mouth every 12 (twelve) hours as needed for anxiety or muscle spasms. 30 tablet 0   lidocaine-prilocaine (EMLA) cream Apply to the Port-A-Cath site 30 minutes before treatment 30 g 0   metFORMIN (GLUCOPHAGE) 1000 MG tablet Take 1 tablet (1,000 mg total) by mouth 2 (two) times daily with a meal. 180 tablet 3   omeprazole (PRILOSEC) 40 MG capsule Take 1 capsule (40 mg total) by mouth daily. For 3 weeks then as needed 30 capsule 3   oxyCODONE-acetaminophen (PERCOCET/ROXICET) 5-325 MG tablet Take 1 tablet by mouth every 6 (six) hours as needed for severe pain. 40 tablet 0   sildenafil (REVATIO) 20 MG tablet Take 1-5 tablets (20-100 mg total) by mouth daily as needed (ED). 30 tablet 3   No current facility-administered medications for this encounter.    Physical Findings: The patient is in no acute distress. Patient is alert and oriented.  height is 5\' 11"  (1.803 m) and weight is 270 lb 2 oz (122.5 kg). His oral temperature is 98.2 F  (36.8 C). His blood pressure is 124/78 and his pulse is 98. His respiration is 18 and oxygen saturation is 98%.   Lungs are clear to auscultation bilaterally. Heart has regular rate and rhythm. No palpable cervical, supraclavicular, or axillary adenopathy. Abdomen soft, non-tender, normal bowel sounds.  Patient points to pain in the left mid pelvis.  He also reports tenderness in his right lateral back and has point tenderness along one of his ribs in the posterior medial rib cage area.  He walks with a limp in light of his left hip pain.  Lab Findings: Lab Results  Component Value Date   WBC 9.1 06/10/2020   HGB 12.8 (L) 06/10/2020   HCT 39.2 06/10/2020   MCV 95.8 06/10/2020   PLT 237 06/10/2020    Radiographic Findings: CT Chest W Contrast  Result Date: 06/06/2020 CLINICAL DATA:  Metastatic squamous cell cancer of the right lung,, restaging, assess treatment response EXAM: CT CHEST, ABDOMEN, AND PELVIS WITH CONTRAST TECHNIQUE: Multidetector CT imaging of the chest, abdomen and pelvis was performed following the standard protocol during bolus administration of intravenous contrast. CONTRAST:  137mL OMNIPAQUE IOHEXOL 300 MG/ML SOLN, additional oral enteric contrast COMPARISON:  CT chest abdomen pelvis, 03/18/2020, PET-CT, 12/20/2019 FINDINGS: CT CHEST FINDINGS Cardiovascular: Left chest port catheter. Aortic atherosclerosis. Normal heart size.  Left coronary artery calcifications. No pericardial effusion. There is at least some, band and web-like chronic embolus present in the lobar pulmonary arteries and more distal branches, not optimally evaluated on this examination (e.g. Series 2, image 31). Mediastinum/Nodes: Interval enlargement of right hilar lymph nodes, largest node measuring 2.0 x 2.0 cm, previously 1.6 x 1.4 cm (series 2, image 33). No change in prominent pretracheal and subcarinal lymph nodes. Thyroid gland, trachea, and esophagus demonstrate no significant findings. Lungs/Pleura:  Interval increase in size of adjacent spiculated nodules of the central right upper lobe, largest nodule measuring approximately 2.2 x 1.6 cm, previously 1.4 x 1.1 cm when measured similarly (series 6, image 55). No pleural effusion or pneumothorax. Small, right-sided fat containing Bochdalek's hernia. Musculoskeletal: Interval increase in lytic component of a mixed lytic and sclerotic lesion of the sternal body (series 5, image 108). There is a new, nondisplaced fracture through a sclerotic lesion of the right eighth rib (series 2, image 45). CT ABDOMEN PELVIS FINDINGS Hepatobiliary: Hepatomegaly and hepatic steatosis. No gallstones, gallbladder wall thickening, or biliary dilatation. Pancreas: Unremarkable. No pancreatic ductal dilatation or surrounding inflammatory changes. Spleen: Normal in size without significant abnormality. Adrenals/Urinary Tract: Unchanged subcentimeter nodule of the medial limb of the right adrenal gland, not previously FDG avid and likely an incidental small adenoma (series 2, image 60). Kidneys are normal, without renal calculi, solid lesion, or hydronephrosis. Bladder is unremarkable. Stomach/Bowel: Stomach is within normal limits. Appendix appears normal. No evidence of bowel wall thickening, distention, or inflammatory changes. Vascular/Lymphatic: Aortic atherosclerosis. No enlarged abdominal or pelvic lymph nodes. Reproductive: No mass or other abnormality. Other: No abdominal wall hernia or abnormality. No abdominopelvic ascites. Musculoskeletal: No significant change in a lytic lesion of the posterior right acetabulum (series 2, image 123). IMPRESSION: 1. Interval increase in size of adjacent spiculated nodules of the central right upper lobe. 2. Interval enlargement of right hilar lymph nodes. Additional mediastinal lymph nodes are unchanged. 3. Interval increase in lytic component of a mixed lytic and sclerotic lesion of the sternal body. There is a new, nondisplaced fracture  through a sclerotic lesion of the right eighth rib. Unchanged lytic lesion of the posterior right acetabulum. 4. Overall constellation of findings is consistent with worsened right lung malignancy and metastatic disease. 5. There is at least some, band and web-like chronic embolus present in the lobar pulmonary arteries and more distal branches, not optimally evaluated on this non tailored, non angiographic examination. 6. No change in a subcentimeter nodule of the medial limb of the right adrenal gland, not previously FDG avid and very likely an incidental small adenoma. Attention on follow-up. 7. Hepatomegaly and hepatic steatosis. 8. Coronary artery disease.  Aortic Atherosclerosis (ICD10-I70.0). Electronically Signed   By: [name] Candle M.D.   On: 06/06/2020 20:31   CT Abdomen Pelvis W Contrast  Result Date: 06/06/2020 CLINICAL DATA:  Metastatic squamous cell cancer of the right lung,, restaging, assess treatment response EXAM: CT CHEST, ABDOMEN, AND PELVIS WITH CONTRAST TECHNIQUE: Multidetector CT imaging of the chest, abdomen and pelvis was performed following the standard protocol during bolus administration of intravenous contrast. CONTRAST:  161mL OMNIPAQUE IOHEXOL 300 MG/ML SOLN, additional oral enteric contrast COMPARISON:  CT chest abdomen pelvis, 03/18/2020, PET-CT, 12/20/2019 FINDINGS: CT CHEST FINDINGS Cardiovascular: Left chest port catheter. Aortic atherosclerosis. Normal heart size. Left coronary artery calcifications. No pericardial effusion. There is at least some, band and web-like chronic embolus present in the lobar pulmonary arteries and more distal branches, not optimally evaluated on  this examination (e.g. Series 2, image 31). Mediastinum/Nodes: Interval enlargement of right hilar lymph nodes, largest node measuring 2.0 x 2.0 cm, previously 1.6 x 1.4 cm (series 2, image 33). No change in prominent pretracheal and subcarinal lymph nodes. Thyroid gland, trachea, and esophagus demonstrate  no significant findings. Lungs/Pleura: Interval increase in size of adjacent spiculated nodules of the central right upper lobe, largest nodule measuring approximately 2.2 x 1.6 cm, previously 1.4 x 1.1 cm when measured similarly (series 6, image 55). No pleural effusion or pneumothorax. Small, right-sided fat containing Bochdalek's hernia. Musculoskeletal: Interval increase in lytic component of a mixed lytic and sclerotic lesion of the sternal body (series 5, image 108). There is a new, nondisplaced fracture through a sclerotic lesion of the right eighth rib (series 2, image 45). CT ABDOMEN PELVIS FINDINGS Hepatobiliary: Hepatomegaly and hepatic steatosis. No gallstones, gallbladder wall thickening, or biliary dilatation. Pancreas: Unremarkable. No pancreatic ductal dilatation or surrounding inflammatory changes. Spleen: Normal in size without significant abnormality. Adrenals/Urinary Tract: Unchanged subcentimeter nodule of the medial limb of the right adrenal gland, not previously FDG avid and likely an incidental small adenoma (series 2, image 60). Kidneys are normal, without renal calculi, solid lesion, or hydronephrosis. Bladder is unremarkable. Stomach/Bowel: Stomach is within normal limits. Appendix appears normal. No evidence of bowel wall thickening, distention, or inflammatory changes. Vascular/Lymphatic: Aortic atherosclerosis. No enlarged abdominal or pelvic lymph nodes. Reproductive: No mass or other abnormality. Other: No abdominal wall hernia or abnormality. No abdominopelvic ascites. Musculoskeletal: No significant change in a lytic lesion of the posterior right acetabulum (series 2, image 123). IMPRESSION: 1. Interval increase in size of adjacent spiculated nodules of the central right upper lobe. 2. Interval enlargement of right hilar lymph nodes. Additional mediastinal lymph nodes are unchanged. 3. Interval increase in lytic component of a mixed lytic and sclerotic lesion of the sternal body.  There is a new, nondisplaced fracture through a sclerotic lesion of the right eighth rib. Unchanged lytic lesion of the posterior right acetabulum. 4. Overall constellation of findings is consistent with worsened right lung malignancy and metastatic disease. 5. There is at least some, band and web-like chronic embolus present in the lobar pulmonary arteries and more distal branches, not optimally evaluated on this non tailored, non angiographic examination. 6. No change in a subcentimeter nodule of the medial limb of the right adrenal gland, not previously FDG avid and very likely an incidental small adenoma. Attention on follow-up. 7. Hepatomegaly and hepatic steatosis. 8. Coronary artery disease.  Aortic Atherosclerosis (ICD10-I70.0). Electronically Signed   By: [name] Candle M.D.   On: 06/06/2020 20:31    Impression: Stage IV (T2b, N3, M1C) non-small cell lung cancer, squamous cell carcinoma presented with right upper lobe lung mass in addition to mediastinal and right supraclavicular lymphadenopathy as well as multiple metastatic bone lesions  Patient would be a good candidate for additional palliative radiation therapy.  Would recommend treatments to his painful rib metastasis in the right lateral rib cage area as well as progressive disease in the spiculated nodules in the right upper lobe and right hilar region.  Patient does not seem to be symptomatic from his disease along the sternum area..  The etiology of his pain in the left pelvis region is unknown but may need to consider an MRI if it is pain continues in this area.  Plan:  Patient is scheduled for CT simulation in the near future.  Patient will be out of town the week of September 13-19.  He would like to delay initiation of his treatment until return from this vacation.  We will simulate him prior to his vacation so that we can have everything ready to start the week of September 20.  Anticipate between 2 to 3 weeks of radiation  therapy  Total time spent in this encounter was 35 minutes which included reviewing the patient's most recent CT scans, follow-ups, chemotherapy, physical examination, and documentation.  -----------------------------------  Blair Promise, PhD, MD  This document serves as a record of services personally performed by Gery Pray, MD. It was created on his behalf by Clerance Lav, a trained medical scribe. The creation of this record is based on the scribe's personal observations and the provider's statements to them. This document has been checked and approved by the attending provider.

## 2020-06-17 ENCOUNTER — Other Ambulatory Visit: Payer: Self-pay | Admitting: Internal Medicine

## 2020-06-17 ENCOUNTER — Encounter: Payer: Self-pay | Admitting: Internal Medicine

## 2020-06-17 ENCOUNTER — Telehealth: Payer: Self-pay | Admitting: Medical Oncology

## 2020-06-17 DIAGNOSIS — C7951 Secondary malignant neoplasm of bone: Secondary | ICD-10-CM

## 2020-06-17 DIAGNOSIS — C3491 Malignant neoplasm of unspecified part of right bronchus or lung: Secondary | ICD-10-CM

## 2020-06-17 MED ORDER — OXYCODONE-ACETAMINOPHEN 5-325 MG PO TABS: 1.0000 | ORAL_TABLET | Freq: Four times a day (QID) | ORAL | 0 refills | Status: DC | PRN

## 2020-06-17 NOTE — Progress Notes (Addendum)
Patient here for a reconsult with Dr. Sondra Come. DIAGNOSIS:Stage IV (T2b, N3, M1C) non-small cell lung cancer, squamous cell carcinoma presented with right upper lobe lung mass in addition to mediastinal and right supraclavicular lymphadenopathy as well as multiple metastatic bone lesions diagnosed in March 2021.  PRIOR THERAPY:Palliative radiotherapy to the painful metastatic bone lesions under the care of Dr. Sondra Come. Last dose 01/14/20  CURRENT THERAPY:Palliative systemic chemotherapy withcarboplatin for AUC of 5, paclitaxel 175 NG/M2 and Keytruda 200 mg IV every 3 weeks with Neulasta support.First dose 01/03/20. Status post6cycles.Starting from cycle #5 he will be on maintenance single agent Keytruda  INTERVAL HISTORY: Mike [name] 54 y.o. male returns to the clinic today for follow-up visit.  The patient is feeling fine today with no concerning complaints except for pain on the right side of the rib cage posteriorly.  He was exercising and he thinks that he has fracture of the rib which was seen on the recent scan.  He denied having any shortness of breath, cough or hemoptysis.  He denied having any fever or chills.  He has no nausea, vomiting, diarrhea or constipation.  He denied having any headache or visual changes.  He continues to tolerate his treatment with Keytruda fairly well.  The patient had repeat CT scan of the chest, abdomen pelvis performed recently and is here for evaluation and discussion of his discuss results.  ASSESSMENT AND PLAN: This is a very pleasant 54 years old white male with a stage IV non-small cell lung cancer, squamous cell carcinoma presented with right upper lobe lung mass in addition to mediastinal and right supraclavicular lymphadenopathy and multiple metastatic bone lesions diagnosed in March 2021 status post palliative radiotherapy to the painful metastatic lesions under the care of Dr. Sondra Come. The patient is currently undergoing palliative systemic  chemotherapy initially with carboplatin, paclitaxel and Keytruda for 4 cycles and he is currently on the maintenance phase of his treatment with single agent Keytruda status post 2 more cycles. The patient has been tolerating this treatment well with no concerning adverse effects. He had repeat CT scan of the chest, abdomen pelvis performed recently.  The scan showed interval increase in the size of the adjacent spiculated nodules in the central right upper lobe as well as interval enlargement of right hilar lymph nodes and some progressive bone lesions. I personally and independently reviewed the scan images and discussed the results with the patient today. I recommend for the patient to see Dr. Sondra Come for consideration of palliative radiotherapy to the enlarging lesions in the right upper lobe and right hilar lymph nodes as well as the painful lesion in the right eighth rib. I will continue his current treatment with immunotherapy for now and if there is any further progression on the upcoming imaging studies, I would consider switching his treatment to systemic chemotherapy with docetaxel and Cyramza. The patient agreed to the current plan. For pain management I will give him refill of Percocet. The patient will come back for follow-up visit in 3 weeks for evaluation before the next cycle of his treatment. He was advised to call immediately if he has any concerning symptoms in the interval. The patient voices understanding of current disease status and treatment options and is in agreement with the current care plan.     Dr. Julien Nordmann   Past/Anticipated interventions by medical oncology, if any:   Signs/Symptoms  Weight changes, if any: no  Respiratory complaints, if any: no  Hemoptysis, if any: no  Pain issues,  if any: 8th rib and left h ip  SAFETY ISSUES:  Prior radiation? yes  Pacemaker/ICD? no  Possible current pregnancy? n/a  Is the patient on methotrexate? no  Current  Complaints / other details:    BP 124/78 (BP Location: Left Arm, Patient Position: Sitting)   Pulse 98   Temp 98.2 F (36.8 C) (Oral)   Resp 18   Ht 5\' 11"  (1.803 m)   Wt 270 lb 2 oz (122.5 kg)   SpO2 98%   BMI 37.67 kg/m   Wt Readings from Last 3 Encounters:  06/18/20 270 lb 2 oz (122.5 kg)  06/10/20 272 lb 3.2 oz (123.5 kg)  05/14/20 (!) 269 lb 3.2 oz (122.1 kg)

## 2020-06-17 NOTE — Telephone Encounter (Signed)
Oxycodone refill requested.

## 2020-06-18 ENCOUNTER — Encounter: Payer: Self-pay | Admitting: Radiation Oncology

## 2020-06-18 ENCOUNTER — Ambulatory Visit
Admission: RE | Admit: 2020-06-18 | Discharge: 2020-06-18 | Disposition: A | Discharge status: Home / Self Care | Payer: 59 | Source: Ambulatory Visit | Attending: Radiation Oncology | Admitting: Radiation Oncology

## 2020-06-18 ENCOUNTER — Telehealth: Payer: Self-pay | Admitting: Internal Medicine

## 2020-06-18 ENCOUNTER — Other Ambulatory Visit: Payer: Self-pay

## 2020-06-18 VITALS — BP 124/78 | HR 98 | Temp 98.2°F | Resp 18 | Ht 71.0 in | Wt 270.1 lb

## 2020-06-18 DIAGNOSIS — C3411 Malignant neoplasm of upper lobe, right bronchus or lung: Secondary | ICD-10-CM | POA: Insufficient documentation

## 2020-06-18 DIAGNOSIS — J984 Other disorders of lung: Secondary | ICD-10-CM | POA: Diagnosis not present

## 2020-06-18 DIAGNOSIS — I2510 Atherosclerotic heart disease of native coronary artery without angina pectoris: Secondary | ICD-10-CM | POA: Insufficient documentation

## 2020-06-18 DIAGNOSIS — K760 Fatty (change of) liver, not elsewhere classified: Secondary | ICD-10-CM | POA: Diagnosis not present

## 2020-06-18 DIAGNOSIS — C3491 Malignant neoplasm of unspecified part of right bronchus or lung: Secondary | ICD-10-CM

## 2020-06-18 DIAGNOSIS — Z79899 Other long term (current) drug therapy: Secondary | ICD-10-CM | POA: Insufficient documentation

## 2020-06-18 DIAGNOSIS — Z7984 Long term (current) use of oral hypoglycemic drugs: Secondary | ICD-10-CM | POA: Insufficient documentation

## 2020-06-18 DIAGNOSIS — Z923 Personal history of irradiation: Secondary | ICD-10-CM | POA: Insufficient documentation

## 2020-06-18 DIAGNOSIS — I700 Atherosclerosis of aorta: Secondary | ICD-10-CM | POA: Diagnosis not present

## 2020-06-18 DIAGNOSIS — Z7901 Long term (current) use of anticoagulants: Secondary | ICD-10-CM | POA: Diagnosis not present

## 2020-06-18 DIAGNOSIS — R160 Hepatomegaly, not elsewhere classified: Secondary | ICD-10-CM | POA: Insufficient documentation

## 2020-06-18 DIAGNOSIS — R590 Localized enlarged lymph nodes: Secondary | ICD-10-CM | POA: Insufficient documentation

## 2020-06-18 DIAGNOSIS — C7951 Secondary malignant neoplasm of bone: Secondary | ICD-10-CM

## 2020-06-18 NOTE — Telephone Encounter (Signed)
Called per 8/31 sch msg - unable to reach pt - left message for patient to call back if reschedule was still needed

## 2020-06-25 ENCOUNTER — Ambulatory Visit
Admission: RE | Admit: 2020-06-25 | Discharge: 2020-06-25 | Disposition: A | Discharge status: Home / Self Care | Payer: 59 | Source: Ambulatory Visit | Attending: Radiation Oncology | Admitting: Radiation Oncology

## 2020-06-25 ENCOUNTER — Other Ambulatory Visit: Payer: Self-pay

## 2020-06-25 DIAGNOSIS — C3491 Malignant neoplasm of unspecified part of right bronchus or lung: Secondary | ICD-10-CM | POA: Diagnosis not present

## 2020-06-25 DIAGNOSIS — C7951 Secondary malignant neoplasm of bone: Secondary | ICD-10-CM | POA: Insufficient documentation

## 2020-06-25 DIAGNOSIS — Z510 Encounter for antineoplastic radiation therapy: Secondary | ICD-10-CM | POA: Diagnosis not present

## 2020-06-26 ENCOUNTER — Other Ambulatory Visit: Payer: Self-pay | Admitting: Internal Medicine

## 2020-06-26 ENCOUNTER — Other Ambulatory Visit: Payer: Self-pay | Admitting: Family Medicine

## 2020-06-26 DIAGNOSIS — C7951 Secondary malignant neoplasm of bone: Secondary | ICD-10-CM

## 2020-06-26 DIAGNOSIS — C3491 Malignant neoplasm of unspecified part of right bronchus or lung: Secondary | ICD-10-CM

## 2020-06-26 NOTE — Telephone Encounter (Signed)
Name of Medication: Diazepam Name of Pharmacy: [facility] Last Fill or Written Date and Quantity: 03/25/20, #30 Last Office Visit and Type: 02/22/20, 3 mo DM f/u Next Office Visit and Type: none Last Controlled Substance Agreement Date: none Last UDS: none

## 2020-06-27 ENCOUNTER — Other Ambulatory Visit: Payer: Self-pay | Admitting: Internal Medicine

## 2020-06-27 DIAGNOSIS — C3491 Malignant neoplasm of unspecified part of right bronchus or lung: Secondary | ICD-10-CM

## 2020-06-27 DIAGNOSIS — C7951 Secondary malignant neoplasm of bone: Secondary | ICD-10-CM

## 2020-06-27 MED ORDER — OXYCODONE-ACETAMINOPHEN 5-325 MG PO TABS: 1.0000 | ORAL_TABLET | Freq: Four times a day (QID) | ORAL | 0 refills | Status: DC | PRN

## 2020-06-27 MED ORDER — DIAZEPAM 5 MG PO TABS: 5.0000 mg | ORAL_TABLET | Freq: Two times a day (BID) | ORAL | 0 refills | Status: DC | PRN

## 2020-06-27 NOTE — Telephone Encounter (Signed)
Dr. Julien Nordmann, For your approval or refusal as this is a narcotic. Gardiner Rhyme, RN

## 2020-06-27 NOTE — Telephone Encounter (Signed)
ERx 

## 2020-06-29 DIAGNOSIS — Z510 Encounter for antineoplastic radiation therapy: Secondary | ICD-10-CM | POA: Diagnosis not present

## 2020-07-01 ENCOUNTER — Inpatient Hospital Stay: Payer: 59 | Admitting: Internal Medicine

## 2020-07-01 ENCOUNTER — Inpatient Hospital Stay: Payer: 59

## 2020-07-07 ENCOUNTER — Other Ambulatory Visit: Payer: Self-pay

## 2020-07-07 ENCOUNTER — Ambulatory Visit
Admission: RE | Admit: 2020-07-07 | Discharge: 2020-07-07 | Disposition: A | Discharge status: Home / Self Care | Payer: 59 | Source: Ambulatory Visit | Attending: Radiation Oncology | Admitting: Radiation Oncology

## 2020-07-07 DIAGNOSIS — Z510 Encounter for antineoplastic radiation therapy: Secondary | ICD-10-CM | POA: Diagnosis not present

## 2020-07-07 DIAGNOSIS — C7951 Secondary malignant neoplasm of bone: Secondary | ICD-10-CM

## 2020-07-07 DIAGNOSIS — C3491 Malignant neoplasm of unspecified part of right bronchus or lung: Secondary | ICD-10-CM

## 2020-07-07 MED ORDER — OXYCODONE-ACETAMINOPHEN 5-325 MG PO TABS: 1.0000 | ORAL_TABLET | Freq: Four times a day (QID) | ORAL | 0 refills | Status: DC | PRN

## 2020-07-08 ENCOUNTER — Encounter: Payer: Self-pay | Admitting: Internal Medicine

## 2020-07-08 ENCOUNTER — Inpatient Hospital Stay: Payer: 59

## 2020-07-08 ENCOUNTER — Inpatient Hospital Stay: Payer: 59 | Attending: Physician Assistant

## 2020-07-08 ENCOUNTER — Ambulatory Visit
Admission: RE | Admit: 2020-07-08 | Discharge: 2020-07-08 | Disposition: A | Discharge status: Home / Self Care | Payer: 59 | Source: Ambulatory Visit | Attending: Radiation Oncology | Admitting: Radiation Oncology

## 2020-07-08 ENCOUNTER — Inpatient Hospital Stay (HOSPITAL_BASED_OUTPATIENT_CLINIC_OR_DEPARTMENT_OTHER): Payer: 59 | Admitting: Internal Medicine

## 2020-07-08 ENCOUNTER — Other Ambulatory Visit: Payer: Self-pay

## 2020-07-08 VITALS — BP 117/78 | HR 87 | Temp 98.6°F | Resp 17 | Ht 71.0 in | Wt 264.0 lb

## 2020-07-08 DIAGNOSIS — Z7901 Long term (current) use of anticoagulants: Secondary | ICD-10-CM | POA: Insufficient documentation

## 2020-07-08 DIAGNOSIS — G4733 Obstructive sleep apnea (adult) (pediatric): Secondary | ICD-10-CM | POA: Insufficient documentation

## 2020-07-08 DIAGNOSIS — K760 Fatty (change of) liver, not elsewhere classified: Secondary | ICD-10-CM | POA: Insufficient documentation

## 2020-07-08 DIAGNOSIS — C3411 Malignant neoplasm of upper lobe, right bronchus or lung: Secondary | ICD-10-CM | POA: Diagnosis not present

## 2020-07-08 DIAGNOSIS — M1990 Unspecified osteoarthritis, unspecified site: Secondary | ICD-10-CM | POA: Insufficient documentation

## 2020-07-08 DIAGNOSIS — M549 Dorsalgia, unspecified: Secondary | ICD-10-CM | POA: Insufficient documentation

## 2020-07-08 DIAGNOSIS — C3491 Malignant neoplasm of unspecified part of right bronchus or lung: Secondary | ICD-10-CM

## 2020-07-08 DIAGNOSIS — Z5112 Encounter for antineoplastic immunotherapy: Secondary | ICD-10-CM

## 2020-07-08 DIAGNOSIS — Z510 Encounter for antineoplastic radiation therapy: Secondary | ICD-10-CM | POA: Diagnosis not present

## 2020-07-08 DIAGNOSIS — Z95828 Presence of other vascular implants and grafts: Secondary | ICD-10-CM

## 2020-07-08 DIAGNOSIS — C7989 Secondary malignant neoplasm of other specified sites: Secondary | ICD-10-CM | POA: Diagnosis not present

## 2020-07-08 DIAGNOSIS — R5383 Other fatigue: Secondary | ICD-10-CM | POA: Insufficient documentation

## 2020-07-08 DIAGNOSIS — C781 Secondary malignant neoplasm of mediastinum: Secondary | ICD-10-CM | POA: Diagnosis not present

## 2020-07-08 DIAGNOSIS — E7800 Pure hypercholesterolemia, unspecified: Secondary | ICD-10-CM | POA: Diagnosis not present

## 2020-07-08 DIAGNOSIS — Z79899 Other long term (current) drug therapy: Secondary | ICD-10-CM | POA: Insufficient documentation

## 2020-07-08 DIAGNOSIS — R42 Dizziness and giddiness: Secondary | ICD-10-CM | POA: Diagnosis not present

## 2020-07-08 DIAGNOSIS — C7951 Secondary malignant neoplasm of bone: Secondary | ICD-10-CM | POA: Insufficient documentation

## 2020-07-08 DIAGNOSIS — E119 Type 2 diabetes mellitus without complications: Secondary | ICD-10-CM | POA: Diagnosis not present

## 2020-07-08 LAB — CBC WITH DIFFERENTIAL (CANCER CENTER ONLY)
Abs Immature Granulocytes: 0.03 10*3/uL (ref 0.00–0.07)
Basophils Absolute: 0 10*3/uL (ref 0.0–0.1)
Basophils Relative: 0 %
Eosinophils Absolute: 0.1 10*3/uL (ref 0.0–0.5)
Eosinophils Relative: 1 %
HCT: 37.6 % — ABNORMAL LOW (ref 39.0–52.0)
Hemoglobin: 12.1 g/dL — ABNORMAL LOW (ref 13.0–17.0)
Immature Granulocytes: 0 %
Lymphocytes Relative: 19 %
Lymphs Abs: 1.5 10*3/uL (ref 0.7–4.0)
MCH: 30.1 pg (ref 26.0–34.0)
MCHC: 32.2 g/dL (ref 30.0–36.0)
MCV: 93.5 fL (ref 80.0–100.0)
Monocytes Absolute: 0.7 10*3/uL (ref 0.1–1.0)
Monocytes Relative: 9 %
Neutro Abs: 5.5 10*3/uL (ref 1.7–7.7)
Neutrophils Relative %: 71 %
Platelet Count: 286 10*3/uL (ref 150–400)
RBC: 4.02 MIL/uL — ABNORMAL LOW (ref 4.22–5.81)
RDW: 12.7 % (ref 11.5–15.5)
WBC Count: 7.9 10*3/uL (ref 4.0–10.5)
nRBC: 0 % (ref 0.0–0.2)

## 2020-07-08 LAB — CMP (CANCER CENTER ONLY)
ALT: 23 U/L (ref 0–44)
AST: 23 U/L (ref 15–41)
Albumin: 3.1 g/dL — ABNORMAL LOW (ref 3.5–5.0)
Alkaline Phosphatase: 84 U/L (ref 38–126)
Anion gap: 12 (ref 5–15)
BUN: 9 mg/dL (ref 6–20)
CO2: 27 mmol/L (ref 22–32)
Calcium: 9.6 mg/dL (ref 8.9–10.3)
Chloride: 100 mmol/L (ref 98–111)
Creatinine: 0.76 mg/dL (ref 0.61–1.24)
GFR, Est AFR Am: >60 mL/min (ref >60)
GFR, Estimated: >60 mL/min (ref >60)
Glucose, Bld: 142 mg/dL — ABNORMAL HIGH (ref 70–99)
Potassium: 4.3 mmol/L (ref 3.5–5.1)
Sodium: 139 mmol/L (ref 135–145)
Total Bilirubin: 0.3 mg/dL (ref 0.3–1.2)
Total Protein: 7.3 g/dL (ref 6.5–8.1)

## 2020-07-08 LAB — TSH: TSH: 2.302 u[IU]/mL (ref 0.320–4.118)

## 2020-07-08 MED ORDER — SODIUM CHLORIDE 0.9% FLUSH
10.0000 mL | INTRAVENOUS | Status: DC | PRN
  Administered 2020-07-08: 10 mL
  Filled 2020-07-08: qty 10

## 2020-07-08 MED ORDER — SODIUM CHLORIDE 0.9% FLUSH
10.0000 mL | Freq: Once | INTRAVENOUS | Status: AC
  Administered 2020-07-08: 10 mL
  Filled 2020-07-08: qty 10

## 2020-07-08 MED ORDER — SODIUM CHLORIDE 0.9 % IV SOLN
Freq: Once | INTRAVENOUS | Status: AC
  Filled 2020-07-08: qty 250

## 2020-07-08 MED ORDER — SODIUM CHLORIDE 0.9 % IV SOLN
200.0000 mg | Freq: Once | INTRAVENOUS | Status: AC
  Administered 2020-07-08: 200 mg via INTRAVENOUS
  Filled 2020-07-08: qty 8

## 2020-07-08 MED ORDER — HEPARIN SOD (PORK) LOCK FLUSH 100 UNIT/ML IV SOLN
500.0000 [IU] | Freq: Once | INTRAVENOUS | Status: AC | PRN
  Administered 2020-07-08: 500 [IU]
  Filled 2020-07-08: qty 5

## 2020-07-08 NOTE — Progress Notes (Signed)
[facility] Telephone:([phone]) [phone]   Fax:(336) [phone]  OFFICE PROGRESS NOTE  [name], MD [place] [address]  DIAGNOSIS: Stage IV (T2b, N3, M1C) non-small cell lung cancer, squamous cell carcinoma presented with right upper lobe lung mass in addition to mediastinal and right supraclavicular lymphadenopathy as well as multiple metastatic bone lesions diagnosed in March 2021.  PRIOR THERAPY: Palliative radiotherapy to the painful metastatic bone lesions under the care of Dr. Sondra Come. Last dose 01/14/20  CURRENT THERAPY: Palliative systemic chemotherapy withcarboplatin for AUC of 5, paclitaxel 175 NG/M2 and Keytruda 200 mg IV every 3 weeks with Neulasta support.First dose 01/03/20. Status post7cycles.Starting from cycle #5 he will be on maintenance single agent Keytruda  INTERVAL HISTORY: Mike [name] 54 y.o. male returns to the clinic today for follow-up visit.  The patient is feeling fine today with no concerning complaints except for worsening low back pain as well as left hip pain.  He is currently undergoing palliative radiotherapy to the metastatic bone lesion as well as the enlarging right lung nodule under the care of Dr. Sondra Come.  Dr. Sondra Come is considering him for MRI of the left hip for further evaluation of this area.  He denied having any current chest pain, shortness of breath, cough or hemoptysis.  He denied having any fever or chills.  He has no nausea, vomiting, diarrhea or constipation.  He has no headache or visual changes.  He has occasional dizzy spells but this has been happening long time before his diagnosis with lung cancer.  His last MRI of the brain in March 2021 was unremarkable for brain metastasis.  The patient is here today for evaluation before starting cycle #8 of his treatment.   MEDICAL HISTORY: Past Medical History:  Diagnosis Date  . Arthritis    neck  . Cancer, metastatic to bone ([place]) dx'd  10/2019  . Diabetes ([place])   . Fatty liver 12/05/2019   By [place] 11/2019  . GERD (gastroesophageal reflux disease)   . 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 High cholesterol   . IBS (irritable bowel syndrome)   . lung ca dx'd 10/2019   lung stage 4   . OSA (obstructive sleep apnea) 09/15/2019   Sleep study 12/2017 - severe OSA with AHI 65.6, desat to 76% rec CPAP  . Severe obesity (BMI 35.0-39.9) with comorbidity ([place]) 09/04/2019    ALLERGIES:  has No Known Allergies.  MEDICATIONS:  Current Outpatient Medications  Medication Sig Dispense Refill  . albuterol (PROVENTIL) (2.5 MG/3ML) 0.083% nebulizer solution Take 3 mLs (2.5 mg total) by nebulization every 6 (six) hours as needed for wheezing or shortness of breath. 75 mL 12  . apixaban (ELIQUIS) 5 MG TABS tablet Take 1 tablet (5 mg total) by mouth 2 (two) times daily. 60 tablet 3  . atorvastatin (LIPITOR) 20 MG tablet Take 1 tablet (20 mg total) by mouth at bedtime. 90 tablet 3  . diazepam (VALIUM) 5 MG tablet Take 1 tablet (5 mg total) by mouth every 12 (twelve) hours as needed for anxiety or muscle spasms. 30 tablet 0  . lidocaine-prilocaine (EMLA) cream Apply to the Port-A-Cath site 30 minutes before treatment 30 g 0  . metFORMIN (GLUCOPHAGE) 1000 MG tablet Take 1 tablet (1,000 mg total) by mouth 2 (two) times daily with a meal. 180 tablet 3  . omeprazole (PRILOSEC) 40 MG capsule Take 1 capsule (40 mg total) by  mouth daily. For 3 weeks then as needed 30 capsule 3  . oxyCODONE-acetaminophen (PERCOCET/ROXICET) 5-325 MG tablet Take 1-2 tablets by mouth every 6 (six) hours as needed for severe pain. 60 tablet 0  . sildenafil (REVATIO) 20 MG tablet Take 1-5 tablets (20-100 mg total) by mouth daily as needed (ED). 30 tablet 3   No current facility-administered medications for this visit.    SURGICAL HISTORY:  Past Surgical History:  Procedure Laterality Date  . BIOPSY  12/18/2019   Procedure: BIOPSY;  Surgeon: Garner Nash, DO;   Location: [facility];  Service: Pulmonary;;  . BRONCHIAL BRUSHINGS  12/18/2019   Procedure: BRONCHIAL BRUSHINGS;  Surgeon: Garner Nash, DO;  Location: [facility];  Service: Pulmonary;;  . BRONCHIAL WASHINGS  12/18/2019   Procedure: BRONCHIAL WASHINGS;  Surgeon: Garner Nash, DO;  Location: [facility];  Service: Pulmonary;;  . COLONOSCOPY  10/2019   mult TA, int hem, rpt 1 yr ([facility])  . CYSTECTOMY     knee- left knee cyst  . ENDOBRONCHIAL ULTRASOUND  12/18/2019   Procedure: ENDOBRONCHIAL ULTRASOUND;  Surgeon: Garner Nash, DO;  Location: [facility];  Service: Pulmonary;;  . FINE NEEDLE ASPIRATION  12/18/2019   Procedure: FINE NEEDLE ASPIRATION;  Surgeon: Garner Nash, DO;  Location: [facility];  Service: Pulmonary;;  . IR IMAGING GUIDED PORT INSERTION  12/27/2019  . PILONIDAL CYST EXCISION    . VIDEO BRONCHOSCOPY WITH ENDOBRONCHIAL ULTRASOUND Right 12/18/2019   Procedure: VIDEO BRONCHOSCOPY;  Surgeon: Garner Nash, DO;  Location: [facility];  Service: Pulmonary;  Laterality: Right;    REVIEW OF SYSTEMS:  A comprehensive review of systems was negative except for: Constitutional: positive for fatigue Musculoskeletal: positive for back pain and bone pain Neurological: positive for dizziness   PHYSICAL EXAMINATION: General appearance: alert, cooperative, fatigued and no distress Head: Normocephalic, without obvious abnormality, atraumatic Neck: no adenopathy, no JVD, supple, symmetrical, trachea midline and thyroid not enlarged, symmetric, no tenderness/mass/nodules Lymph nodes: Cervical, supraclavicular, and axillary nodes normal. Resp: clear to auscultation bilaterally Back: symmetric, no curvature. ROM normal. No CVA tenderness. Cardio: regular rate and rhythm, S1, S2 normal, no murmur, click, rub or gallop GI: soft, non-tender; bowel sounds normal; no masses,  no organomegaly Extremities: extremities normal, atraumatic, no cyanosis or edema  ECOG PERFORMANCE  STATUS: 1 - Symptomatic but completely ambulatory  Blood pressure 117/78, pulse 87, temperature 98.6 F (37 C), temperature source Tympanic, resp. rate 17, height 5\' 11"  (1.803 m), weight 264 lb (119.7 kg), SpO2 95 %.  LABORATORY DATA: Lab Results  Component Value Date   WBC 7.9 07/08/2020   HGB 12.1 (L) 07/08/2020   HCT 37.6 (L) 07/08/2020   MCV 93.5 07/08/2020   PLT 286 07/08/2020      Chemistry      Component Value Date/Time   NA 138 06/10/2020 1019   K 4.0 06/10/2020 1019   CL 101 06/10/2020 1019   CO2 28 06/10/2020 1019   BUN 12 06/10/2020 1019   CREATININE 0.81 06/10/2020 1019      Component Value Date/Time   CALCIUM 9.7 06/10/2020 1019   ALKPHOS 84 06/10/2020 1019   AST 28 06/10/2020 1019   ALT 35 06/10/2020 1019   BILITOT 0.3 06/10/2020 1019       RADIOGRAPHIC STUDIES: No results found.  ASSESSMENT AND PLAN: This is a very pleasant 54 years old white male with a stage IV non-small cell lung cancer, squamous cell carcinoma presented with right upper lobe lung  mass in addition to mediastinal and right supraclavicular lymphadenopathy and multiple metastatic bone lesions diagnosed in March 2021 status post palliative radiotherapy to the painful metastatic lesions under the care of Dr. Sondra Come. The patient is currently undergoing palliative systemic chemotherapy initially with carboplatin, paclitaxel and Keytruda for 4 cycles and he is currently on the maintenance phase of his treatment with single agent Keytruda status post 3 more cycles. He continues to tolerate his treatment with Keytruda fairly well. The patient is also undergoing palliative radiotherapy to the painful bone lesion as well as the right lung progressive nodule. I recommended for him to proceed with cycle #8 of his treatment today as planned. For pain management he will continue on Percocet.  He will also continue with the palliative radiotherapy under the care of Dr. Sondra Come. The patient will come back  for follow-up visit in 3 weeks for evaluation before starting cycle #9. The patient was advised to call immediately if he has any concerning symptoms in the interval.  The patient voices understanding of current disease status and treatment options and is in agreement with the current care plan.  All questions were answered. The patient knows to call the clinic with any problems, questions or concerns. We can certainly see the patient much sooner if necessary.  Disclaimer: This note was dictated with voice recognition software. Similar sounding words can inadvertently be transcribed and may not be corrected upon review.

## 2020-07-08 NOTE — Patient Instructions (Signed)
[facility] Discharge Instructions for Patients Receiving Chemotherapy  Today you received the following chemotherapy agents:  Keytruda.  To help prevent nausea and vomiting after your treatment, we encourage you to take your nausea medication as directed.   If you develop nausea and vomiting that is not controlled by your nausea medication, call the clinic.   BELOW ARE SYMPTOMS THAT SHOULD BE REPORTED IMMEDIATELY:  *FEVER GREATER THAN 100.5 F  *CHILLS WITH OR WITHOUT FEVER  NAUSEA AND VOMITING THAT IS NOT CONTROLLED WITH YOUR NAUSEA MEDICATION  *UNUSUAL SHORTNESS OF BREATH  *UNUSUAL BRUISING OR BLEEDING  TENDERNESS IN MOUTH AND THROAT WITH OR WITHOUT PRESENCE OF ULCERS  *URINARY PROBLEMS  *BOWEL PROBLEMS  UNUSUAL RASH Items with * indicate a potential emergency and should be followed up as soon as possible.  Feel free to call the clinic should you have any questions or concerns. The clinic phone number is [phone].  Please show the CHEMO ALERT CARD at check-in to the Emergency Department and triage nurse.    

## 2020-07-09 ENCOUNTER — Other Ambulatory Visit: Payer: 59

## 2020-07-09 ENCOUNTER — Ambulatory Visit: Payer: 59 | Admitting: Physician Assistant

## 2020-07-09 ENCOUNTER — Ambulatory Visit: Payer: 59

## 2020-07-09 ENCOUNTER — Telehealth: Payer: Self-pay | Admitting: Internal Medicine

## 2020-07-09 ENCOUNTER — Ambulatory Visit
Admission: RE | Admit: 2020-07-09 | Discharge: 2020-07-09 | Disposition: A | Discharge status: Home / Self Care | Payer: 59 | Source: Ambulatory Visit | Attending: Radiation Oncology | Admitting: Radiation Oncology

## 2020-07-09 DIAGNOSIS — Z510 Encounter for antineoplastic radiation therapy: Secondary | ICD-10-CM | POA: Diagnosis not present

## 2020-07-09 NOTE — Telephone Encounter (Signed)
Scheduled per los. Called and spoke with patient. Informed of cancelled appt on 10/5. Confirmed all other appts

## 2020-07-10 ENCOUNTER — Ambulatory Visit
Admission: RE | Admit: 2020-07-10 | Discharge: 2020-07-10 | Disposition: A | Discharge status: Home / Self Care | Payer: 59 | Source: Ambulatory Visit | Attending: Radiation Oncology | Admitting: Radiation Oncology

## 2020-07-10 DIAGNOSIS — Z510 Encounter for antineoplastic radiation therapy: Secondary | ICD-10-CM | POA: Diagnosis not present

## 2020-07-11 ENCOUNTER — Ambulatory Visit
Admission: RE | Admit: 2020-07-11 | Discharge: 2020-07-11 | Disposition: A | Discharge status: Home / Self Care | Payer: 59 | Source: Ambulatory Visit | Attending: Radiation Oncology | Admitting: Radiation Oncology

## 2020-07-11 DIAGNOSIS — Z510 Encounter for antineoplastic radiation therapy: Secondary | ICD-10-CM | POA: Diagnosis not present

## 2020-07-12 ENCOUNTER — Ambulatory Visit (HOSPITAL_COMMUNITY): Payer: 59

## 2020-07-14 ENCOUNTER — Encounter: Payer: Self-pay | Admitting: Internal Medicine

## 2020-07-14 ENCOUNTER — Telehealth: Payer: Self-pay | Admitting: *Deleted

## 2020-07-14 ENCOUNTER — Other Ambulatory Visit: Payer: Self-pay | Admitting: Radiation Oncology

## 2020-07-14 ENCOUNTER — Other Ambulatory Visit: Payer: Self-pay

## 2020-07-14 ENCOUNTER — Ambulatory Visit
Admission: RE | Admit: 2020-07-14 | Discharge: 2020-07-14 | Disposition: A | Discharge status: Home / Self Care | Payer: 59 | Source: Ambulatory Visit | Attending: Radiation Oncology | Admitting: Radiation Oncology

## 2020-07-14 DIAGNOSIS — C7951 Secondary malignant neoplasm of bone: Secondary | ICD-10-CM

## 2020-07-14 DIAGNOSIS — Z510 Encounter for antineoplastic radiation therapy: Secondary | ICD-10-CM | POA: Diagnosis not present

## 2020-07-14 DIAGNOSIS — C3491 Malignant neoplasm of unspecified part of right bronchus or lung: Secondary | ICD-10-CM

## 2020-07-14 MED ORDER — OXYCODONE HCL 5 MG PO TABS: 5.0000 mg | ORAL_TABLET | ORAL | 0 refills | Status: DC | PRN

## 2020-07-14 MED ORDER — MORPHINE SULFATE ER 30 MG PO TBCR: 30.0000 mg | EXTENDED_RELEASE_TABLET | Freq: Two times a day (BID) | ORAL | 0 refills | Status: DC

## 2020-07-14 NOTE — Telephone Encounter (Signed)
Called patient to inform of MRI for 07-19-20 - arrival time- 7:30 am @ [facility], no restrictions to test, spoke with patient and he is aware of this test

## 2020-07-15 ENCOUNTER — Other Ambulatory Visit: Payer: Self-pay

## 2020-07-15 ENCOUNTER — Ambulatory Visit
Admission: RE | Admit: 2020-07-15 | Discharge: 2020-07-15 | Disposition: A | Discharge status: Home / Self Care | Payer: 59 | Source: Ambulatory Visit | Attending: Radiation Oncology | Admitting: Radiation Oncology

## 2020-07-15 DIAGNOSIS — Z510 Encounter for antineoplastic radiation therapy: Secondary | ICD-10-CM | POA: Diagnosis not present

## 2020-07-16 ENCOUNTER — Encounter: Payer: Self-pay | Admitting: Radiation Oncology

## 2020-07-16 ENCOUNTER — Other Ambulatory Visit: Payer: Self-pay

## 2020-07-16 ENCOUNTER — Ambulatory Visit
Admission: RE | Admit: 2020-07-16 | Discharge: 2020-07-16 | Disposition: A | Discharge status: Home / Self Care | Payer: 59 | Source: Ambulatory Visit | Attending: Radiation Oncology | Admitting: Radiation Oncology

## 2020-07-16 DIAGNOSIS — Z510 Encounter for antineoplastic radiation therapy: Secondary | ICD-10-CM | POA: Diagnosis not present

## 2020-07-17 ENCOUNTER — Ambulatory Visit: Payer: 59

## 2020-07-18 ENCOUNTER — Ambulatory Visit: Payer: 59

## 2020-07-19 ENCOUNTER — Ambulatory Visit (HOSPITAL_COMMUNITY)
Admission: RE | Admit: 2020-07-19 | Discharge: 2020-07-19 | Disposition: A | Discharge status: Home / Self Care | Payer: 59 | Source: Ambulatory Visit | Attending: Radiation Oncology | Admitting: Radiation Oncology

## 2020-07-19 DIAGNOSIS — C3491 Malignant neoplasm of unspecified part of right bronchus or lung: Secondary | ICD-10-CM | POA: Diagnosis not present

## 2020-07-19 MED ORDER — GADOBUTROL 1 MMOL/ML IV SOLN
10.0000 mL | Freq: Once | INTRAVENOUS | Status: AC | PRN
  Administered 2020-07-19: 10 mL via INTRAVENOUS

## 2020-07-21 ENCOUNTER — Ambulatory Visit: Payer: 59 | Admitting: Radiation Oncology

## 2020-07-21 ENCOUNTER — Other Ambulatory Visit: Payer: Self-pay | Admitting: Radiation Oncology

## 2020-07-21 DIAGNOSIS — C3491 Malignant neoplasm of unspecified part of right bronchus or lung: Secondary | ICD-10-CM

## 2020-07-22 ENCOUNTER — Other Ambulatory Visit: Payer: Self-pay | Admitting: Radiation Oncology

## 2020-07-22 ENCOUNTER — Other Ambulatory Visit: Payer: 59

## 2020-07-22 ENCOUNTER — Ambulatory Visit: Payer: 59 | Admitting: Internal Medicine

## 2020-07-22 ENCOUNTER — Telehealth: Payer: Self-pay | Admitting: *Deleted

## 2020-07-22 ENCOUNTER — Encounter: Payer: Self-pay | Admitting: Orthopaedic Surgery

## 2020-07-22 ENCOUNTER — Ambulatory Visit (INDEPENDENT_AMBULATORY_CARE_PROVIDER_SITE_OTHER): Payer: 59 | Admitting: Orthopaedic Surgery

## 2020-07-22 ENCOUNTER — Ambulatory Visit: Payer: 59

## 2020-07-22 DIAGNOSIS — C7951 Secondary malignant neoplasm of bone: Secondary | ICD-10-CM

## 2020-07-22 DIAGNOSIS — Z9189 Other specified personal risk factors, not elsewhere classified: Secondary | ICD-10-CM | POA: Insufficient documentation

## 2020-07-22 NOTE — Telephone Encounter (Signed)
PATIENT TO HAVE AN APPT. WITH DR. Erlinda Hong ON 07/22/20 @ 3:15 PM

## 2020-07-22 NOTE — Progress Notes (Signed)
Office Visit Note   Patient: Mike [name]           Date of Birth: January 10, 1966           MRN: [id number] Visit Date: 07/22/2020              Requested by: Gery Pray, MD [place],  [place] [address] PCP: [name], MD   Assessment & Plan: Visit Diagnoses:  1. Impending pathologic fracture   2. Bony metastasis ([place])     Plan: My impression is impending pathologic fracture due to bony metastasis to proximal left femur. I reviewed the MRI with the patient today and given the large size of the bony met as well as the location and the severe pain that he has he will need to have this stabilized as soon as possible. I have coordinated admission for him to the hospitalist service tomorrow. He will need to stop his Eliquis at this point and then he will be bridged with most likely heparin. My plan is to perform surgical stabilization Saturday morning. He will be able to resume Eliquis approximately 12 hours postoperatively. He will also need palliative radiation to the left proximal femur postoperatively. Total face to face encounter time was greater than 45 minutes and over half of this time was spent in counseling and/or coordination of care.   Follow-Up Instructions: Return if symptoms worsen or fail to improve.   Orders:  No orders of the defined types were placed in this encounter.  No orders of the defined types were placed in this encounter.     Procedures: No procedures performed   Clinical Data: No additional findings.   Subjective: Chief Complaint  Patient presents with  . Left Leg - Pain    Mike [name] is a very pleasant 54 year old gentleman who unfortunately has stage IV lung cancer who has been referred to [place] for surgical evaluation by Dr. Earlie Server for recent findings of a metastatic lesion to the proximal left femur. He has had increasing pain recently and he has been minimally ambulatory because of it. He is only able to ambulate with a  cane but not very well. He is currently taking Eliquis for a saddle PE that was discovered about 6 months ago.   Review of Systems  Constitutional: Negative.   All other systems reviewed and are negative.    Objective: Vital Signs: There were no vitals taken for this visit.  Physical Exam Vitals and nursing note reviewed.  Constitutional:      Appearance: He is well-developed.  HENT:     Head: Normocephalic and atraumatic.  Eyes:     Pupils: Pupils are equal, round, and reactive to light.  Pulmonary:     Effort: Pulmonary effort is normal.  Abdominal:     Palpations: Abdomen is soft.  Musculoskeletal:        General: Normal range of motion.     Cervical back: Neck supple.  Skin:    General: Skin is warm.  Neurological:     Mental Status: He is alert and oriented to person, place, and time.  Psychiatric:        Behavior: Behavior normal.        Thought Content: Thought content normal.        Judgment: Judgment normal.     Ortho Exam Left hip shows no significant pain with gentle range of motion. Ambulation and weightbearing not tested due to pain. Neurovascular intact distally. Specialty Comments:  No  specialty comments available.  Imaging: No results found.   PMFS History: Patient Active Problem List   Diagnosis Date Noted  . Impending pathologic fracture 07/22/2020  . Situational depression 02/22/2020  . UTI due to extended-spectrum beta lactamase (ESBL) producing Escherichia coli 02/10/2020  . Abnormal thyroid function test 02/10/2020  . Port-A-Cath in place 01/29/2020  . Acute pulmonary embolism ([place]) 01/20/2020  . Stage IV squamous cell carcinoma of right lung ([place]) 12/24/2019  . Goals of care, counseling/discussion 12/24/2019  . Encounter for antineoplastic chemotherapy 12/24/2019  . Encounter for antineoplastic immunotherapy 12/24/2019  . Rib pain on right side 12/12/2019  . Bony metastasis ([place]) 12/12/2019  . Fatty liver 12/05/2019  . Encounter  for well adult exam with abnormal findings 11/25/2019  . Chest pain 11/25/2019  . GERD (gastroesophageal reflux disease) 11/25/2019  . Dyslipidemia associated with type 2 diabetes mellitus ([place]) 11/25/2019  . Ex-smoker 09/15/2019  . OSA (obstructive sleep apnea) 09/15/2019  . Severe obesity (BMI 35.0-39.9) with comorbidity ([place]) 09/04/2019  . Erectile dysfunction 10/18/2014  . Type 2 diabetes mellitus with other specified complication ([place]) 16/38/4536   Past Medical History:  Diagnosis Date  . Arthritis    neck  . Cancer, metastatic to bone ([place]) dx'd 10/2019  . Diabetes ([place])   . Fatty liver 12/05/2019   By [place] 11/2019  . GERD (gastroesophageal reflux disease)   . 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 High cholesterol   . IBS (irritable bowel syndrome)   . lung ca dx'd 10/2019   lung stage 4   . OSA (obstructive sleep apnea) 09/15/2019   Sleep study 12/2017 - severe OSA with AHI 65.6, desat to 76% rec CPAP  . Severe obesity (BMI 35.0-39.9) with comorbidity ([place]) 09/04/2019    Family History  Problem Relation Age of Onset  . CAD Mother        stents  . Stroke Neg Hx   . Diabetes Neg Hx   . Cancer Neg Hx   . Colon cancer Neg Hx   . Esophageal cancer Neg Hx   . Stomach cancer Neg Hx   . Rectal cancer Neg Hx     Past Surgical History:  Procedure Laterality Date  . BIOPSY  12/18/2019   Procedure: BIOPSY;  Surgeon: Garner Nash, DO;  Location: [facility];  Service: Pulmonary;;  . BRONCHIAL BRUSHINGS  12/18/2019   Procedure: BRONCHIAL BRUSHINGS;  Surgeon: Garner Nash, DO;  Location: [facility];  Service: Pulmonary;;  . BRONCHIAL WASHINGS  12/18/2019   Procedure: BRONCHIAL WASHINGS;  Surgeon: Garner Nash, DO;  Location: [facility];  Service: Pulmonary;;  . COLONOSCOPY  10/2019   mult TA, int hem, rpt 1 yr ([facility])  . CYSTECTOMY     knee- left knee cyst  . ENDOBRONCHIAL ULTRASOUND  12/18/2019   Procedure: ENDOBRONCHIAL ULTRASOUND;  Surgeon: Garner Nash, DO;  Location: [facility];  Service: Pulmonary;;  . FINE NEEDLE ASPIRATION  12/18/2019   Procedure: FINE NEEDLE ASPIRATION;  Surgeon: Garner Nash, DO;  Location: [facility];  Service: Pulmonary;;  . IR IMAGING GUIDED PORT INSERTION  12/27/2019  . PILONIDAL CYST EXCISION    . VIDEO BRONCHOSCOPY WITH ENDOBRONCHIAL ULTRASOUND Right 12/18/2019   Procedure: VIDEO BRONCHOSCOPY;  Surgeon: Garner Nash, DO;  Location: [facility];  Service: Pulmonary;  Laterality: Right;   Social History   Occupational History  . Not on file  Tobacco Use  . Smoking status: Former Smoker  Packs/day: 3.00    Years: 40.00    Pack years: 120.00    Quit date: 01/18/2020    Years since quitting: 0.5  . Smokeless tobacco: Never Used  . Tobacco comment: currently smoking less than 0.5ppd  Vaping Use  . Vaping Use: Never used  Substance and Sexual Activity  . Alcohol use: Yes    Comment: Rarely  . Drug use: Never  . Sexual activity: Yes    Partners: Female

## 2020-07-23 ENCOUNTER — Telehealth: Payer: Self-pay | Admitting: Orthopaedic Surgery

## 2020-07-23 NOTE — Telephone Encounter (Signed)
Patient's fiance' Lattie Haw called asked when will the patient be admitted to the hospital? Lattie Haw said patient was taken off eliquis and want to know what medication patient will be put on due to having a blood clot. Lattie Haw said patient will need to be transported by ambulance to the hospital. The number to contact Lattie Haw is [phone]

## 2020-07-23 NOTE — Telephone Encounter (Signed)
Spoke to bed placement and he is 2nd in line for admission and I've asked them to give him a call.  I apologize about the wait.

## 2020-07-23 NOTE — Telephone Encounter (Signed)
What about 2nd part of msg regarding med?

## 2020-07-23 NOTE — Telephone Encounter (Signed)
He will likely be placed on heparin

## 2020-07-24 ENCOUNTER — Other Ambulatory Visit: Payer: Self-pay

## 2020-07-24 ENCOUNTER — Encounter (HOSPITAL_COMMUNITY): Payer: Self-pay | Admitting: Student

## 2020-07-24 ENCOUNTER — Telehealth: Payer: Self-pay

## 2020-07-24 ENCOUNTER — Inpatient Hospital Stay (HOSPITAL_COMMUNITY)
Admission: RE | Admit: 2020-07-24 | Discharge: 2020-07-28 | DRG: 481 | Disposition: A | Discharge status: Home / Self Care | Payer: 59 | Attending: Internal Medicine | Admitting: Internal Medicine

## 2020-07-24 DIAGNOSIS — G4733 Obstructive sleep apnea (adult) (pediatric): Secondary | ICD-10-CM

## 2020-07-24 DIAGNOSIS — S728X2A Other fracture of left femur, initial encounter for closed fracture: Secondary | ICD-10-CM | POA: Diagnosis not present

## 2020-07-24 DIAGNOSIS — S7290XA Unspecified fracture of unspecified femur, initial encounter for closed fracture: Secondary | ICD-10-CM | POA: Diagnosis present

## 2020-07-24 DIAGNOSIS — S72145A Nondisplaced intertrochanteric fracture of left femur, initial encounter for closed fracture: Secondary | ICD-10-CM | POA: Diagnosis not present

## 2020-07-24 DIAGNOSIS — M84452A Pathological fracture, left femur, initial encounter for fracture: Secondary | ICD-10-CM | POA: Diagnosis present

## 2020-07-24 DIAGNOSIS — M25552 Pain in left hip: Secondary | ICD-10-CM | POA: Diagnosis present

## 2020-07-24 DIAGNOSIS — Z7901 Long term (current) use of anticoagulants: Secondary | ICD-10-CM

## 2020-07-24 DIAGNOSIS — Z66 Do not resuscitate: Secondary | ICD-10-CM | POA: Diagnosis present

## 2020-07-24 DIAGNOSIS — I2782 Chronic pulmonary embolism: Secondary | ICD-10-CM | POA: Diagnosis not present

## 2020-07-24 DIAGNOSIS — Z8249 Family history of ischemic heart disease and other diseases of the circulatory system: Secondary | ICD-10-CM | POA: Diagnosis not present

## 2020-07-24 DIAGNOSIS — Z9119 Patient's noncompliance with other medical treatment and regimen: Secondary | ICD-10-CM

## 2020-07-24 DIAGNOSIS — Z6836 Body mass index (BMI) 36.0-36.9, adult: Secondary | ICD-10-CM

## 2020-07-24 DIAGNOSIS — G893 Neoplasm related pain (acute) (chronic): Secondary | ICD-10-CM

## 2020-07-24 DIAGNOSIS — C7951 Secondary malignant neoplasm of bone: Principal | ICD-10-CM | POA: Diagnosis present

## 2020-07-24 DIAGNOSIS — C3490 Malignant neoplasm of unspecified part of unspecified bronchus or lung: Secondary | ICD-10-CM

## 2020-07-24 DIAGNOSIS — E7800 Pure hypercholesterolemia, unspecified: Secondary | ICD-10-CM | POA: Diagnosis present

## 2020-07-24 DIAGNOSIS — E119 Type 2 diabetes mellitus without complications: Secondary | ICD-10-CM | POA: Diagnosis present

## 2020-07-24 DIAGNOSIS — Z20822 Contact with and (suspected) exposure to covid-19: Secondary | ICD-10-CM | POA: Diagnosis present

## 2020-07-24 DIAGNOSIS — I2692 Saddle embolus of pulmonary artery without acute cor pulmonale: Secondary | ICD-10-CM

## 2020-07-24 DIAGNOSIS — K219 Gastro-esophageal reflux disease without esophagitis: Secondary | ICD-10-CM | POA: Diagnosis present

## 2020-07-24 DIAGNOSIS — Z79899 Other long term (current) drug therapy: Secondary | ICD-10-CM

## 2020-07-24 DIAGNOSIS — K760 Fatty (change of) liver, not elsewhere classified: Secondary | ICD-10-CM | POA: Diagnosis present

## 2020-07-24 DIAGNOSIS — E1165 Type 2 diabetes mellitus with hyperglycemia: Secondary | ICD-10-CM | POA: Diagnosis not present

## 2020-07-24 DIAGNOSIS — Z87891 Personal history of nicotine dependence: Secondary | ICD-10-CM

## 2020-07-24 DIAGNOSIS — K589 Irritable bowel syndrome without diarrhea: Secondary | ICD-10-CM | POA: Diagnosis present

## 2020-07-24 DIAGNOSIS — R531 Weakness: Secondary | ICD-10-CM | POA: Diagnosis present

## 2020-07-24 DIAGNOSIS — Z86711 Personal history of pulmonary embolism: Secondary | ICD-10-CM | POA: Diagnosis not present

## 2020-07-24 DIAGNOSIS — M1990 Unspecified osteoarthritis, unspecified site: Secondary | ICD-10-CM | POA: Diagnosis present

## 2020-07-24 DIAGNOSIS — Z9189 Other specified personal risk factors, not elsewhere classified: Secondary | ICD-10-CM

## 2020-07-24 DIAGNOSIS — Z85118 Personal history of other malignant neoplasm of bronchus and lung: Secondary | ICD-10-CM

## 2020-07-24 DIAGNOSIS — E669 Obesity, unspecified: Secondary | ICD-10-CM | POA: Diagnosis present

## 2020-07-24 DIAGNOSIS — Z7984 Long term (current) use of oral hypoglycemic drugs: Secondary | ICD-10-CM

## 2020-07-24 DIAGNOSIS — Z419 Encounter for procedure for purposes other than remedying health state, unspecified: Secondary | ICD-10-CM

## 2020-07-24 LAB — APTT
aPTT: 90 seconds — ABNORMAL HIGH (ref 24–36)
aPTT: 148 seconds — ABNORMAL HIGH (ref 24–36)

## 2020-07-24 LAB — COMPREHENSIVE METABOLIC PANEL
ALT: 28 U/L (ref 0–44)
AST: 26 U/L (ref 15–41)
Albumin: 2.8 g/dL — ABNORMAL LOW (ref 3.5–5.0)
Alkaline Phosphatase: 66 U/L (ref 38–126)
Anion gap: 9 (ref 5–15)
BUN: 7 mg/dL (ref 6–20)
CO2: 30 mmol/L (ref 22–32)
Calcium: 9.3 mg/dL (ref 8.9–10.3)
Chloride: 100 mmol/L (ref 98–111)
Creatinine, Ser: 0.71 mg/dL (ref 0.61–1.24)
GFR calc non Af Amer: >60 mL/min (ref >60)
Glucose, Bld: 109 mg/dL — ABNORMAL HIGH (ref 70–99)
Potassium: 4.6 mmol/L (ref 3.5–5.1)
Sodium: 139 mmol/L (ref 135–145)
Total Bilirubin: 0.5 mg/dL (ref 0.3–1.2)
Total Protein: 6.5 g/dL (ref 6.5–8.1)

## 2020-07-24 LAB — CBC
HCT: 36.7 % — ABNORMAL LOW (ref 39.0–52.0)
Hemoglobin: 11.8 g/dL — ABNORMAL LOW (ref 13.0–17.0)
MCH: 30.3 pg (ref 26.0–34.0)
MCHC: 32.2 g/dL (ref 30.0–36.0)
MCV: 94.1 fL (ref 80.0–100.0)
Platelets: 289 10*3/uL (ref 150–400)
RBC: 3.9 MIL/uL — ABNORMAL LOW (ref 4.22–5.81)
RDW: 13.2 % (ref 11.5–15.5)
WBC: 6.8 10*3/uL (ref 4.0–10.5)
nRBC: 0 % (ref 0.0–0.2)

## 2020-07-24 LAB — TSH: TSH: 2.708 u[IU]/mL (ref 0.350–4.500)

## 2020-07-24 LAB — PROTIME-INR
INR: 1.1 (ref 0.8–1.2)
Prothrombin Time: 14 seconds (ref 11.4–15.2)

## 2020-07-24 LAB — SURGICAL PCR SCREEN
MRSA, PCR: NEGATIVE
Staphylococcus aureus: NEGATIVE

## 2020-07-24 LAB — HEPARIN LEVEL (UNFRACTIONATED): Heparin Unfractionated: 0.69 IU/mL (ref 0.30–0.70)

## 2020-07-24 LAB — HEMOGLOBIN A1C
Hgb A1c MFr Bld: 7.8 % — ABNORMAL HIGH (ref 4.8–5.6)
Mean Plasma Glucose: 177.16 mg/dL

## 2020-07-24 LAB — RESPIRATORY PANEL BY RT PCR (FLU A&B, COVID)
Influenza A by PCR: NEGATIVE
Influenza B by PCR: NEGATIVE
SARS Coronavirus 2 by RT PCR: NEGATIVE

## 2020-07-24 LAB — GLUCOSE, CAPILLARY
Glucose-Capillary: 100 mg/dL — ABNORMAL HIGH (ref 70–99)
Glucose-Capillary: 88 mg/dL (ref 70–99)
Glucose-Capillary: 119 mg/dL — ABNORMAL HIGH (ref 70–99)

## 2020-07-24 MED ORDER — HEPARIN BOLUS VIA INFUSION
4000.0000 [IU] | Freq: Once | INTRAVENOUS | Status: AC
  Administered 2020-07-24: 4000 [IU] via INTRAVENOUS
  Filled 2020-07-24: qty 4000

## 2020-07-24 MED ORDER — HEPARIN (PORCINE) 25000 UT/250ML-% IV SOLN
1850.0000 [IU]/h | INTRAVENOUS | Status: DC
  Administered 2020-07-24: 1900 [IU]/h via INTRAVENOUS
  Administered 2020-07-25: 1850 [IU]/h via INTRAVENOUS
  Filled 2020-07-24 (×2): qty 250

## 2020-07-24 MED ORDER — POLYETHYLENE GLYCOL 3350 17 G PO PACK: 17.0000 g | PACK | Freq: Every day | ORAL | Status: DC | PRN

## 2020-07-24 MED ORDER — CHLORHEXIDINE GLUCONATE CLOTH 2 % EX PADS
6.0000 | MEDICATED_PAD | Freq: Every day | CUTANEOUS | Status: DC
  Administered 2020-07-24 – 2020-07-28 (×5): 6 via TOPICAL

## 2020-07-24 MED ORDER — HYDROMORPHONE HCL 1 MG/ML IJ SOLN: 0.5000 mg | INTRAMUSCULAR | Status: DC | PRN

## 2020-07-24 MED ORDER — MORPHINE SULFATE ER 30 MG PO TBCR
30.0000 mg | EXTENDED_RELEASE_TABLET | Freq: Two times a day (BID) | ORAL | Status: DC
  Administered 2020-07-24 – 2020-07-28 (×8): 30 mg via ORAL
  Filled 2020-07-24 (×8): qty 1

## 2020-07-24 MED ORDER — NICOTINE 21 MG/24HR TD PT24
21.0000 mg | MEDICATED_PATCH | Freq: Every day | TRANSDERMAL | Status: DC
  Administered 2020-07-24 – 2020-07-28 (×4): 21 mg via TRANSDERMAL
  Filled 2020-07-24 (×5): qty 1

## 2020-07-24 MED ORDER — SODIUM CHLORIDE 0.9 % IV SOLN: INTRAVENOUS | Status: DC

## 2020-07-24 MED ORDER — INSULIN ASPART 100 UNIT/ML ~~LOC~~ SOLN
0.0000 [IU] | Freq: Every day | SUBCUTANEOUS | Status: DC
  Administered 2020-07-25: 2 [IU] via SUBCUTANEOUS

## 2020-07-24 MED ORDER — SODIUM CHLORIDE 0.9% FLUSH: 10.0000 mL | INTRAVENOUS | Status: DC | PRN

## 2020-07-24 MED ORDER — ENSURE PRE-SURGERY PO LIQD
296.0000 mL | Freq: Once | ORAL | Status: AC
  Administered 2020-07-25: 296 mL via ORAL
  Filled 2020-07-24: qty 296

## 2020-07-24 MED ORDER — ATORVASTATIN CALCIUM 10 MG PO TABS
20.0000 mg | ORAL_TABLET | Freq: Every day | ORAL | Status: DC
  Administered 2020-07-24 – 2020-07-27 (×4): 20 mg via ORAL
  Filled 2020-07-24 (×4): qty 2

## 2020-07-24 MED ORDER — ONDANSETRON HCL 4 MG/2ML IJ SOLN: 4.0000 mg | Freq: Four times a day (QID) | INTRAMUSCULAR | Status: DC | PRN

## 2020-07-24 MED ORDER — BISACODYL 5 MG PO TBEC: 5.0000 mg | DELAYED_RELEASE_TABLET | Freq: Every day | ORAL | Status: DC | PRN

## 2020-07-24 MED ORDER — OXYCODONE HCL 5 MG PO TABS
5.0000 mg | ORAL_TABLET | Freq: Four times a day (QID) | ORAL | Status: DC | PRN
  Administered 2020-07-24 – 2020-07-25 (×2): 5 mg via ORAL
  Filled 2020-07-24 (×2): qty 1

## 2020-07-24 MED ORDER — INSULIN ASPART 100 UNIT/ML ~~LOC~~ SOLN
0.0000 [IU] | Freq: Three times a day (TID) | SUBCUTANEOUS | Status: DC
  Administered 2020-07-25 (×2): 2 [IU] via SUBCUTANEOUS
  Administered 2020-07-26 (×2): 1 [IU] via SUBCUTANEOUS
  Administered 2020-07-27 – 2020-07-28 (×4): 2 [IU] via SUBCUTANEOUS

## 2020-07-24 MED ORDER — ONDANSETRON HCL 4 MG PO TABS: 4.0000 mg | ORAL_TABLET | Freq: Four times a day (QID) | ORAL | Status: DC | PRN

## 2020-07-24 MED ORDER — DIAZEPAM 5 MG PO TABS
5.0000 mg | ORAL_TABLET | Freq: Two times a day (BID) | ORAL | Status: DC | PRN
  Administered 2020-07-27: 5 mg via ORAL
  Filled 2020-07-24: qty 1

## 2020-07-24 NOTE — Telephone Encounter (Signed)
Lisa aware.

## 2020-07-24 NOTE — Telephone Encounter (Signed)
Called number to advise on message below. No answer. LMOM. Just need to advise on message below.

## 2020-07-24 NOTE — Progress Notes (Signed)
RT note. Pt. Placed on auto B/CPAP 20/5. Pt. Resting comfortable, RT will continue to monitor

## 2020-07-24 NOTE — Progress Notes (Signed)
ANTICOAGULATION CONSULT NOTE - Follow Up Consult  Pharmacy Consult for Heparin Indication: pulmonary embolus  No Known Allergies  Patient Measurements: Height: 5\' 11"  (180.3 cm) Weight: 113.4 kg (250 lb) IBW/kg (Calculated) : 75.3  Heparin Dosing Weight: 100 kg  Vital Signs: Temp: 97.9 F (36.6 C) (10/07 1926) Temp Source: Oral (10/07 1926) BP: 121/81 (10/07 1926) Pulse Rate: 100 (10/07 1926)  Labs: Recent Labs    07/24/20 1055 07/24/20 1950  HGB 11.8*  --   HCT 36.7*  --   PLT 289  --   APTT 90* 148*  LABPROT 14.0  --   INR 1.1  --   HEPARINUNFRC  --  0.69  CREATININE 0.71  --     Estimated Creatinine Clearance: 132 mL/min (by C-G formula based on SCr of 0.71 mg/dL).   Medical History: Past Medical History:  Diagnosis Date  . Arthritis    neck  . Cancer, metastatic to bone ([place]) dx'd 10/2019  . Diabetes ([place])   . Fatty liver 12/05/2019   By [place] 11/2019  . GERD (gastroesophageal reflux disease)   . 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 High cholesterol   . IBS (irritable bowel syndrome)   . lung ca dx'd 10/2019   lung stage 4   . OSA (obstructive sleep apnea) 09/15/2019   Sleep study 12/2017 - severe OSA with AHI 65.6, desat to 76% rec CPAP  . Severe obesity (BMI 35.0-39.9) with comorbidity ([place]) 09/04/2019    Assessment: 54 yr old man with stage IV lung cancer was referred to [facility] for surgical evaluation of recent findings of a metastatic lesion to the proximal left femur. Pt was taking apixaban PTA for a saddle PE diagnosed ~6 months ago.  Pharmacy was consulted to start IV heparin as a bridge to surgery (planned 10/9). Last dose of apixaban was 10/5 at 0800; this may still be influencing heparin levels, so we monitored anticoagulation initially with both aPTT and heparin level. Of note, pt had elevated aPTT (90 sec) today prior to receiving heparin.  Initial heparin level and aPTT ~8 hrs after heparin 4000 units IV bolus X 1,  followed by heparin infusion at 1900 units/hr, were 0.69 units/ml (at upper end of goal range) and 148 sec (above goal range), respectively, indicating that apixaban is likely not influencing heparin level at this point; will monitor anticoagulation using heparin levels going forward. CBC stable. Per RN, no issues with IV or bleeding observed.  Goal of Therapy:  Heparin level 0.3-0.7 units/ml  Monitor platelets by anticoagulation protocol: Yes   Plan:  Reduce heparin infusion to 1850 units/hr Check heparin level in 6 hrs Monitor heparin level, CBC daily Monitor for signs/symptoms of bleeding  Gillermina Hu, PharmD, BCPS, [facility] Clinical Pharmacist 07/24/2020, 8:47 PM

## 2020-07-24 NOTE — Progress Notes (Signed)
ANTICOAGULATION CONSULT NOTE - Initial Consult  Pharmacy Consult for Heparin Indication: pulmonary embolus  No Known Allergies  Patient Measurements: Weight: 119.7 kg (264 lb)   Vital Signs:    Labs: Recent Labs    07/24/20 1055  HGB 11.8*  HCT 36.7*  PLT 289    Estimated Creatinine Clearance: 135.8 mL/min (by C-G formula based on SCr of 0.76 mg/dL).   Medical History: Past Medical History:  Diagnosis Date  . Arthritis    neck  . Cancer, metastatic to bone ([place]) dx'd 10/2019  . Diabetes ([place])   . Fatty liver 12/05/2019   By [place] 11/2019  . GERD (gastroesophageal reflux disease)   . 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 High cholesterol   . IBS (irritable bowel syndrome)   . lung ca dx'd 10/2019   lung stage 4   . OSA (obstructive sleep apnea) 09/15/2019   Sleep study 12/2017 - severe OSA with AHI 65.6, desat to 76% rec CPAP  . Severe obesity (BMI 35.0-39.9) with comorbidity ([place]) 09/04/2019      Assessment:  54 year old gentleman who has stage IV lung cancer who has been referred to [place] for surgical evaluation for recent findings of a metastatic lesion to the proximal left femur. He has had increasing pain recently and he has been minimally ambulatory because of it. He is only able to ambulate with a cane but not very well. He is currently taking Eliquis for a saddle PE that was discovered about 6 months ago.  Pharmacy is consulted to start heparin as a bridge to surgery (planned 10/9). Last dose of apixaban was 10/5 at 0800.  This may still be influencing heparin levels, so we may need to monitor with aPTT and Heparin levels until they correlate.  Goal of Therapy:  Heparin level 0.3-0.7 units/ml  APTT 66 - 102 secs Monitor platelets by anticoagulation protocol: Yes   Plan:  Give 4000 units bolus x 1 Start heparin infusion at 1900 units/hr Check anti-Xa level and aPTT in 6 hours and daily while on heparin Continue to monitor H&H and platelets  Alanda Slim, PharmD, [facility] Clinical Pharmacist Please see AMION for all Pharmacists' Contact Phone Numbers 07/24/2020, 11:34 AM

## 2020-07-24 NOTE — Plan of Care (Signed)
  Problem: Education: Goal: Knowledge of General Education information will improve Description: Including pain rating scale, medication(s)/side effects and non-pharmacologic comfort measures Outcome: Progressing   Problem: Nutrition: Goal: Adequate nutrition will be maintained Outcome: Progressing   Problem: Coping: Goal: Level of anxiety will decrease Outcome: Progressing   

## 2020-07-24 NOTE — H&P (Signed)
History and Physical    Mike [name] HDQ:[id number] DOB: [birth year] DOA: 07/24/2020  PCP: [name], MD Patient coming from: home  Chief Complaint: Left hip pain  HPI: Mike [name] is a 54 y.o. 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  Patient reports left hip pain for about 2 months.  Pain has gradually gotten worse.  He was referred to orthopedic surgery for surgical intervention.  He denies history of trauma or fall.  He had an MRI by radiation oncology on 10/2 that showed metastatic lesions to multiple bones including left femoral neck and intertrochanteric femur with possible small cortical break concerning for pathologic fracture.  Patient was seen by orthopedic surgeon on 10/5 and sent to the hospital today as a direct admit.  Patient describes his pain as dull most of the time but gets sharp and severe with movement.  Also feels zapping pain intermittently at night.  He denies numbness or tingling.  Reports weakness in left leg partly due to pain.  Has been using cane.  He denies fever, chills, bowel or bladder issue.  Denies URI symptoms, GI or UTI symptoms.  He is fully vaccinated against JJHER-74 as of July 2021.   Lives with his wife.  Smoked about 2 pack a day since he was 54 years of age and cut down to half a pack a day earlier this year.  Admits to occasional alcohol.  Denies recreational drug use.  He says he has DNR paper at home.   ROS All review of system negative except for pertinent positives and negatives as history of present illness above.   PMH Past Medical History:  Diagnosis Date  . Arthritis    neck  . Cancer, metastatic to bone ([place]) dx'd 10/2019  . Diabetes ([place])   . Fatty liver  12/05/2019   By [place] 11/2019  . GERD (gastroesophageal reflux disease)   . 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 High cholesterol   . IBS (irritable bowel syndrome)   . lung ca dx'd 10/2019   lung stage 4   . OSA (obstructive sleep apnea) 09/15/2019   Sleep study 12/2017 - severe OSA with AHI 65.6, desat to 76% rec CPAP  . Severe obesity (BMI 35.0-39.9) with comorbidity ([place]) 09/04/2019   [place] Past Surgical History:  Procedure Laterality Date  . BIOPSY  12/18/2019   Procedure: BIOPSY;  Surgeon: Garner Nash, DO;  Location: [facility];  Service: Pulmonary;;  . BRONCHIAL BRUSHINGS  12/18/2019   Procedure: BRONCHIAL BRUSHINGS;  Surgeon: Garner Nash, DO;  Location: [facility] [place];  Service: Pulmonary;;  . BRONCHIAL WASHINGS  12/18/2019   Procedure: BRONCHIAL WASHINGS;  Surgeon: Garner Nash, DO;  Location: [facility];  Service: Pulmonary;;  . COLONOSCOPY  10/2019   mult TA, int hem, rpt 1 yr ([place])  . CYSTECTOMY     knee- left knee cyst  . ENDOBRONCHIAL ULTRASOUND  12/18/2019   Procedure: ENDOBRONCHIAL ULTRASOUND;  Surgeon: Garner Nash, DO;  Location: [facility];  Service: Pulmonary;;  . FINE NEEDLE ASPIRATION  12/18/2019   Procedure: FINE NEEDLE ASPIRATION;  Surgeon: Garner Nash, DO;  Location: [place];  Service: Pulmonary;;  . IR IMAGING GUIDED PORT INSERTION  12/27/2019  .  PILONIDAL CYST EXCISION    . VIDEO BRONCHOSCOPY WITH ENDOBRONCHIAL ULTRASOUND Right 12/18/2019   Procedure: VIDEO BRONCHOSCOPY;  Surgeon: Garner Nash, DO;  Location: [place];  Service: Pulmonary;  Laterality: Right;   Fam HX Family History  Problem Relation Age of Onset  . CAD Mother        stents  . Stroke Neg Hx   . Diabetes Neg Hx   . Cancer Neg Hx   . Colon cancer Neg Hx   . Esophageal cancer Neg Hx   . Stomach cancer Neg Hx   . Rectal cancer Neg Hx     Social Hx  reports that he quit smoking about 6 months ago. He has a 120.00 pack-year smoking history. He  has never used smokeless tobacco. He reports current alcohol use. He reports that he does not use drugs.  Allergy No Known Allergies Home Meds Prior to Admission medications   Medication Sig Start Date End Date Taking? Authorizing Provider  albuterol (PROVENTIL) (2.5 MG/3ML) 0.083% nebulizer solution Take 3 mLs (2.5 mg total) by nebulization every 6 (six) hours as needed for wheezing or shortness of breath. 01/09/20  Yes Icard, Leory Plowman L, DO  apixaban (ELIQUIS) 5 MG TABS tablet Take 1 tablet (5 mg total) by mouth 2 (two) times daily. 05/21/20  Yes [name], MD  atorvastatin (LIPITOR) 20 MG tablet Take 1 tablet (20 mg total) by mouth at bedtime. 05/01/20  Yes [name], MD  diazepam (VALIUM) 5 MG tablet Take 1 tablet (5 mg total) by mouth every 12 (twelve) hours as needed for anxiety or muscle spasms. 06/27/20  Yes [name], MD  metFORMIN (GLUCOPHAGE) 1000 MG tablet Take 1 tablet (1,000 mg total) by mouth 2 (two) times daily with a meal. 02/22/20  Yes [name], MD  morphine (MS CONTIN) 30 MG 12 hr tablet Take 1 tablet (30 mg total) by mouth every 12 (twelve) hours. 07/14/20  Yes Gery Pray, MD  oxyCODONE (OXY IR/ROXICODONE) 5 MG immediate release tablet Take 1 tablet (5 mg total) by mouth every 4 (four) hours as needed for severe pain. Patient taking differently: Take 5 mg by mouth in the morning and at bedtime.  07/14/20  Yes Gery Pray, MD  sildenafil (REVATIO) 20 MG tablet Take 1-5 tablets (20-100 mg total) by mouth daily as needed (ED). Patient taking differently: Take 40 mg by mouth daily as needed (ED).  06/08/20  Yes [name], MD    Physical Exam: There were no vitals filed for this visit.  GENERAL: No acute distress.  Appears well.  HEENT: MMM.  Vision and hearing grossly intact.  NECK: Supple.  No apparent JVD.  RESP: On room air.  No IWOB. Good air movement bilaterally. CVS:  RRR. Heart sounds normal.  ABD/GI/GU: Bowel sounds present. Soft.  Non tender.  MSK/EXT:  Moves extremities. No apparent deformity or edema.  Weakness in left leg partly due to pain SKIN: no apparent skin lesion or wound NEURO: Awake, alert and oriented appropriately.  No gross deficit. Weakness in left leg partly due to pain PSYCH: Calm. Normal affect.   Personally Reviewed Radiological Exams No results found.   Personally Reviewed Labs: CBC: No results for input(s): WBC, NEUTROABS, HGB, HCT, MCV, PLT in the last 168 hours. Basic Metabolic Panel: No results for input(s): NA, K, CL, CO2, GLUCOSE, BUN, CREATININE, CALCIUM, MG, PHOS in the last 168 hours. GFR: CrCl cannot be calculated (Unknown ideal weight.). Liver Function Tests: No results for input(s): AST,  ALT, ALKPHOS, BILITOT, PROT, ALBUMIN in the last 168 hours. No results for input(s): LIPASE, AMYLASE in the last 168 hours. No results for input(s): AMMONIA in the last 168 hours. Coagulation Profile: No results for input(s): INR, PROTIME in the last 168 hours. Cardiac Enzymes: No results for input(s): CKTOTAL, CKMB, CKMBINDEX, TROPONINI in the last 168 hours. BNP (last 3 results) No results for input(s): PROBNP in the last 8760 hours. HbA1C: No results for input(s): HGBA1C in the last 72 hours. CBG: No results for input(s): GLUCAP in the last 168 hours. Lipid Profile: No results for input(s): CHOL, HDL, LDLCALC, TRIG, CHOLHDL, LDLDIRECT in the last 72 hours. Thyroid Function Tests: No results for input(s): TSH, T4TOTAL, FREET4, T3FREE, THYROIDAB in the last 72 hours. Anemia Panel: No results for input(s): VITAMINB12, FOLATE, FERRITIN, TIBC, IRON, RETICCTPCT in the last 72 hours. Urine analysis:    Component Value Date/Time   COLORURINE YELLOW 02/19/2020 1100   APPEARANCEUR HAZY (A) 02/19/2020 1100   LABSPEC 1.021 02/19/2020 1100   PHURINE 5.0 02/19/2020 1100   GLUCOSEU NEGATIVE 02/19/2020 1100   HGBUR LARGE (A) 02/19/2020 1100   BILIRUBINUR NEGATIVE 02/19/2020 1100   BILIRUBINUR  negative 09/04/2019 1014   KETONESUR NEGATIVE 02/19/2020 1100   PROTEINUR 100 (A) 02/19/2020 1100   UROBILINOGEN 0.2 09/04/2019 1014   NITRITE POSITIVE (A) 02/19/2020 1100   LEUKOCYTESUR MODERATE (A) 02/19/2020 1100    Sepsis Labs:  None  Personally Reviewed EKG:  Pending  Assessment/Plan Pathologic left femoral fracture likely from metastatic lung cancer as noted on recent MRI on 07/19/2020. -Plan for surgical intervention by orthopedic surgery, Dr. Erlinda Hong on 10/9. -Last Eliquis dose the morning of 07/22/2020. 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 S/p  palliative systemic chemotherapy and radiotherapy currently on Keytruda.  Followed by Dr. Julien Nordmann. -Outpatient follow-up with oncology  History of saddle PE: Diagnosed about 6 months ago.  Has been on Eliquis. -Hold Eliquis -Heparin per pharmacy.  Cancer pain-on MS Contin and Oxy IR -Continue home medications -Bowel regimen as needed  Uncontrolled NIDDM-2: A1c 7.8%.  On Metformin at home. Recent Labs  Lab 07/24/20 1130  GLUCAP 100*  -SSI-thin and CBG monitoring  Tobacco use disorder: Over 86-pack-year history.  Recently cut down to half a pack a day. -Nicotine patch  OSA: Does not wear CPAP at night -Willing to try CPAP  Morbid obesity: BMI 36.82 with diabetes. -Encourage lifestyle change  DVT prophylaxis: On IV heparin for PE  Code Status: DNR Family Communication: Updated patient's wife over the phone.  Disposition Plan: Admit to MedSurg Consults called: Orthopedic surgery aware of patient. Sent here by Dr. Erlinda Hong Admission status: Inpatient   Mercy Riding MD [facility]  If 7PM-7AM, please contact night-coverage [email or web address]  07/24/2020, 11:12 AM

## 2020-07-24 NOTE — Progress Notes (Signed)
I have spoken with the patient since he has arrived at the hospital.  The plan is for prophylactic stabilization of an impending pathologic fracture of his left femur for Saturday morning but depending on my availability there is a chance that I can do the surgery Friday afternoon.  I will touch base with the patient again tomorrow morning regarding timing of surgery.  He will remain on IV heparin drip for now.  He needs to be on bedrest.  I will put him on n.p.o. after midnight in case we are able to do the surgery tomorrow afternoon.  Azucena Cecil, MD [facility] [phone] 2:47 PM

## 2020-07-24 NOTE — Telephone Encounter (Signed)
Returned call

## 2020-07-24 NOTE — Telephone Encounter (Signed)
Lattie Haw called in retuning missed call

## 2020-07-25 ENCOUNTER — Inpatient Hospital Stay (HOSPITAL_COMMUNITY): Payer: 59 | Admitting: Anesthesiology

## 2020-07-25 ENCOUNTER — Encounter (HOSPITAL_COMMUNITY): Payer: Self-pay | Admitting: Student

## 2020-07-25 ENCOUNTER — Inpatient Hospital Stay (HOSPITAL_COMMUNITY): Admission: RE | Admit: 2020-07-25 | Payer: 59 | Source: Home / Self Care | Admitting: Orthopaedic Surgery

## 2020-07-25 ENCOUNTER — Inpatient Hospital Stay (HOSPITAL_COMMUNITY): Payer: 59

## 2020-07-25 ENCOUNTER — Encounter (HOSPITAL_COMMUNITY)
Admission: RE | Disposition: A | Discharge status: Home / Self Care | Payer: Self-pay | Source: Home / Self Care | Attending: Internal Medicine

## 2020-07-25 DIAGNOSIS — Z9189 Other specified personal risk factors, not elsewhere classified: Secondary | ICD-10-CM

## 2020-07-25 DIAGNOSIS — S72145A Nondisplaced intertrochanteric fracture of left femur, initial encounter for closed fracture: Secondary | ICD-10-CM

## 2020-07-25 DIAGNOSIS — C7951 Secondary malignant neoplasm of bone: Secondary | ICD-10-CM | POA: Diagnosis not present

## 2020-07-25 HISTORY — PX: INTRAMEDULLARY (IM) NAIL INTERTROCHANTERIC: SHX5875

## 2020-07-25 LAB — HEPARIN LEVEL (UNFRACTIONATED): Heparin Unfractionated: 0.53 IU/mL (ref 0.30–0.70)

## 2020-07-25 LAB — CBC
HCT: 34.7 % — ABNORMAL LOW (ref 39.0–52.0)
Hemoglobin: 11 g/dL — ABNORMAL LOW (ref 13.0–17.0)
MCH: 29.8 pg (ref 26.0–34.0)
MCHC: 31.7 g/dL (ref 30.0–36.0)
MCV: 94 fL (ref 80.0–100.0)
Platelets: 264 10*3/uL (ref 150–400)
RBC: 3.69 MIL/uL — ABNORMAL LOW (ref 4.22–5.81)
RDW: 13.2 % (ref 11.5–15.5)
WBC: 6.2 10*3/uL (ref 4.0–10.5)
nRBC: 0 % (ref 0.0–0.2)

## 2020-07-25 LAB — GLUCOSE, CAPILLARY
Glucose-Capillary: 182 mg/dL — ABNORMAL HIGH (ref 70–99)
Glucose-Capillary: 97 mg/dL (ref 70–99)
Glucose-Capillary: 130 mg/dL — ABNORMAL HIGH (ref 70–99)
Glucose-Capillary: 160 mg/dL — ABNORMAL HIGH (ref 70–99)
Glucose-Capillary: 208 mg/dL — ABNORMAL HIGH (ref 70–99)

## 2020-07-25 LAB — COMPREHENSIVE METABOLIC PANEL
ALT: 24 U/L (ref 0–44)
AST: 19 U/L (ref 15–41)
Albumin: 2.4 g/dL — ABNORMAL LOW (ref 3.5–5.0)
Alkaline Phosphatase: 61 U/L (ref 38–126)
Anion gap: 7 (ref 5–15)
BUN: 7 mg/dL (ref 6–20)
CO2: 29 mmol/L (ref 22–32)
Calcium: 8.8 mg/dL — ABNORMAL LOW (ref 8.9–10.3)
Chloride: 100 mmol/L (ref 98–111)
Creatinine, Ser: 0.76 mg/dL (ref 0.61–1.24)
GFR calc non Af Amer: >60 mL/min (ref >60)
Glucose, Bld: 162 mg/dL — ABNORMAL HIGH (ref 70–99)
Potassium: 3.8 mmol/L (ref 3.5–5.1)
Sodium: 136 mmol/L (ref 135–145)
Total Bilirubin: 0.4 mg/dL (ref 0.3–1.2)
Total Protein: 5.7 g/dL — ABNORMAL LOW (ref 6.5–8.1)

## 2020-07-25 SURGERY — FIXATION, FRACTURE, INTERTROCHANTERIC, WITH INTRAMEDULLARY ROD
Anesthesia: General | Site: Hip | Laterality: Left

## 2020-07-25 MED ORDER — MAGNESIUM CITRATE PO SOLN: 1.0000 | Freq: Once | ORAL | Status: DC | PRN

## 2020-07-25 MED ORDER — TRANEXAMIC ACID 1000 MG/10ML IV SOLN
INTRAVENOUS | Status: DC | PRN
  Administered 2020-07-25: 2000 mg via TOPICAL

## 2020-07-25 MED ORDER — HYDROMORPHONE HCL 1 MG/ML IJ SOLN
0.5000 mg | INTRAMUSCULAR | Status: DC | PRN
  Administered 2020-07-25: 1 mg via INTRAVENOUS
  Filled 2020-07-25: qty 1

## 2020-07-25 MED ORDER — CHLORHEXIDINE GLUCONATE 0.12 % MT SOLN
15.0000 mL | Freq: Once | OROMUCOSAL | Status: AC
  Filled 2020-07-25: qty 15

## 2020-07-25 MED ORDER — FENTANYL CITRATE (PF) 100 MCG/2ML IJ SOLN
25.0000 ug | INTRAMUSCULAR | Status: DC | PRN
  Administered 2020-07-25 (×3): 25 ug via INTRAVENOUS

## 2020-07-25 MED ORDER — CELECOXIB 200 MG PO CAPS: 200.0000 mg | ORAL_CAPSULE | Freq: Once | ORAL | Status: AC

## 2020-07-25 MED ORDER — MIDAZOLAM HCL 5 MG/5ML IJ SOLN
INTRAMUSCULAR | Status: DC | PRN
  Administered 2020-07-25: 2 mg via INTRAVENOUS

## 2020-07-25 MED ORDER — TRANEXAMIC ACID 1000 MG/10ML IV SOLN
2000.0000 mg | INTRAVENOUS | Status: DC
  Filled 2020-07-25: qty 20

## 2020-07-25 MED ORDER — APIXABAN 5 MG PO TABS
5.0000 mg | ORAL_TABLET | Freq: Two times a day (BID) | ORAL | Status: DC
  Administered 2020-07-26 – 2020-07-28 (×5): 5 mg via ORAL
  Filled 2020-07-25 (×5): qty 1

## 2020-07-25 MED ORDER — ACETAMINOPHEN 500 MG PO TABS: 1000.0000 mg | ORAL_TABLET | Freq: Once | ORAL | Status: AC

## 2020-07-25 MED ORDER — LIDOCAINE 2% (20 MG/ML) 5 ML SYRINGE
INTRAMUSCULAR | Status: DC | PRN
  Administered 2020-07-25: 100 mg via INTRAVENOUS

## 2020-07-25 MED ORDER — OXYCODONE HCL 5 MG PO TABS
10.0000 mg | ORAL_TABLET | ORAL | Status: DC | PRN
  Administered 2020-07-25 – 2020-07-27 (×7): 15 mg via ORAL
  Filled 2020-07-25 (×7): qty 3

## 2020-07-25 MED ORDER — DOCUSATE SODIUM 100 MG PO CAPS
100.0000 mg | ORAL_CAPSULE | Freq: Two times a day (BID) | ORAL | Status: DC
  Administered 2020-07-25 – 2020-07-28 (×6): 100 mg via ORAL
  Filled 2020-07-25 (×6): qty 1

## 2020-07-25 MED ORDER — ALBUTEROL SULFATE HFA 108 (90 BASE) MCG/ACT IN AERS
INHALATION_SPRAY | RESPIRATORY_TRACT | Status: DC | PRN
  Administered 2020-07-25: 4 via RESPIRATORY_TRACT
  Administered 2020-07-25: 2 via RESPIRATORY_TRACT

## 2020-07-25 MED ORDER — PHENYLEPHRINE HCL-NACL 10-0.9 MG/250ML-% IV SOLN
INTRAVENOUS | Status: DC | PRN
  Administered 2020-07-25: 40 ug/min via INTRAVENOUS

## 2020-07-25 MED ORDER — ONDANSETRON HCL 4 MG PO TABS: 4.0000 mg | ORAL_TABLET | Freq: Four times a day (QID) | ORAL | Status: DC | PRN

## 2020-07-25 MED ORDER — POLYETHYLENE GLYCOL 3350 17 G PO PACK: 17.0000 g | PACK | Freq: Every day | ORAL | Status: DC | PRN

## 2020-07-25 MED ORDER — LACTATED RINGERS IV SOLN: INTRAVENOUS | Status: DC

## 2020-07-25 MED ORDER — PHENYLEPHRINE 40 MCG/ML (10ML) SYRINGE FOR IV PUSH (FOR BLOOD PRESSURE SUPPORT)
PREFILLED_SYRINGE | INTRAVENOUS | Status: DC | PRN
  Administered 2020-07-25: 80 ug via INTRAVENOUS

## 2020-07-25 MED ORDER — METHOCARBAMOL 500 MG PO TABS
500.0000 mg | ORAL_TABLET | Freq: Four times a day (QID) | ORAL | Status: DC | PRN
  Administered 2020-07-25 – 2020-07-28 (×5): 500 mg via ORAL
  Filled 2020-07-25 (×5): qty 1

## 2020-07-25 MED ORDER — TRANEXAMIC ACID-NACL 1000-0.7 MG/100ML-% IV SOLN
1000.0000 mg | INTRAVENOUS | Status: AC
  Administered 2020-07-25: 1000 mg via INTRAVENOUS

## 2020-07-25 MED ORDER — ACETAMINOPHEN 325 MG PO TABS: 325.0000 mg | ORAL_TABLET | Freq: Four times a day (QID) | ORAL | Status: DC | PRN

## 2020-07-25 MED ORDER — CHLORHEXIDINE GLUCONATE 0.12 % MT SOLN
OROMUCOSAL | Status: AC
  Administered 2020-07-25: 15 mL via OROMUCOSAL
  Filled 2020-07-25: qty 15

## 2020-07-25 MED ORDER — SUCCINYLCHOLINE CHLORIDE 200 MG/10ML IV SOSY
PREFILLED_SYRINGE | INTRAVENOUS | Status: DC | PRN
  Administered 2020-07-25: 120 mg via INTRAVENOUS

## 2020-07-25 MED ORDER — CELECOXIB 200 MG PO CAPS
ORAL_CAPSULE | ORAL | Status: AC
  Administered 2020-07-25: 200 mg via ORAL
  Filled 2020-07-25: qty 1

## 2020-07-25 MED ORDER — ONDANSETRON HCL 4 MG/2ML IJ SOLN
INTRAMUSCULAR | Status: DC | PRN
  Administered 2020-07-25: 4 mg via INTRAVENOUS

## 2020-07-25 MED ORDER — FENTANYL CITRATE (PF) 250 MCG/5ML IJ SOLN
INTRAMUSCULAR | Status: DC | PRN
Start: 2020-07-25 — End: 2020-07-25
  Administered 2020-07-25: 50 ug via INTRAVENOUS
  Administered 2020-07-25 (×2): 100 ug via INTRAVENOUS

## 2020-07-25 MED ORDER — DEXAMETHASONE SODIUM PHOSPHATE 10 MG/ML IJ SOLN
INTRAMUSCULAR | Status: DC | PRN
  Administered 2020-07-25: 4 mg via INTRAVENOUS

## 2020-07-25 MED ORDER — PROMETHAZINE HCL 25 MG/ML IJ SOLN: 6.2500 mg | INTRAMUSCULAR | Status: DC | PRN

## 2020-07-25 MED ORDER — OXYCODONE-ACETAMINOPHEN 5-325 MG PO TABS: 1.0000 | ORAL_TABLET | Freq: Three times a day (TID) | ORAL | 0 refills | Status: DC | PRN

## 2020-07-25 MED ORDER — PHENOL 1.4 % MT LIQD: 1.0000 | OROMUCOSAL | Status: DC | PRN

## 2020-07-25 MED ORDER — ONDANSETRON HCL 4 MG/2ML IJ SOLN: 4.0000 mg | Freq: Four times a day (QID) | INTRAMUSCULAR | Status: DC | PRN

## 2020-07-25 MED ORDER — PROPOFOL 10 MG/ML IV BOLUS
INTRAVENOUS | Status: DC | PRN
  Administered 2020-07-25: 120 mg via INTRAVENOUS

## 2020-07-25 MED ORDER — FENTANYL CITRATE (PF) 100 MCG/2ML IJ SOLN
INTRAMUSCULAR | Status: AC
  Administered 2020-07-25: 50 ug via INTRAVENOUS
  Filled 2020-07-25: qty 2

## 2020-07-25 MED ORDER — CEFAZOLIN SODIUM-DEXTROSE 2-4 GM/100ML-% IV SOLN
2.0000 g | Freq: Four times a day (QID) | INTRAVENOUS | Status: AC
  Administered 2020-07-25 – 2020-07-26 (×3): 2 g via INTRAVENOUS
  Filled 2020-07-25 (×4): qty 100

## 2020-07-25 MED ORDER — CEFAZOLIN SODIUM-DEXTROSE 2-4 GM/100ML-% IV SOLN
2.0000 g | INTRAVENOUS | Status: AC
  Administered 2020-07-25: 2 g via INTRAVENOUS

## 2020-07-25 MED ORDER — MENTHOL 3 MG MT LOZG: 1.0000 | LOZENGE | OROMUCOSAL | Status: DC | PRN

## 2020-07-25 MED ORDER — ROCURONIUM BROMIDE 10 MG/ML (PF) SYRINGE
PREFILLED_SYRINGE | INTRAVENOUS | Status: DC | PRN
  Administered 2020-07-25: 50 mg via INTRAVENOUS

## 2020-07-25 MED ORDER — SODIUM CHLORIDE 0.9 % IV SOLN: INTRAVENOUS | Status: DC

## 2020-07-25 MED ORDER — 0.9 % SODIUM CHLORIDE (POUR BTL) OPTIME
TOPICAL | Status: DC | PRN
  Administered 2020-07-25: 1000 mL

## 2020-07-25 MED ORDER — SORBITOL 70 % SOLN: 30.0000 mL | Freq: Every day | Status: DC | PRN

## 2020-07-25 MED ORDER — CEFAZOLIN SODIUM-DEXTROSE 2-4 GM/100ML-% IV SOLN
INTRAVENOUS | Status: AC
  Filled 2020-07-25: qty 100

## 2020-07-25 MED ORDER — TRANEXAMIC ACID-NACL 1000-0.7 MG/100ML-% IV SOLN
INTRAVENOUS | Status: AC
  Filled 2020-07-25: qty 100

## 2020-07-25 MED ORDER — ALUM & MAG HYDROXIDE-SIMETH 200-200-20 MG/5ML PO SUSP: 30.0000 mL | ORAL | Status: DC | PRN

## 2020-07-25 MED ORDER — ACETAMINOPHEN 500 MG PO TABS
1000.0000 mg | ORAL_TABLET | Freq: Four times a day (QID) | ORAL | Status: AC
  Administered 2020-07-25 – 2020-07-26 (×4): 1000 mg via ORAL
  Filled 2020-07-25 (×4): qty 2

## 2020-07-25 MED ORDER — ACETAMINOPHEN 500 MG PO TABS
ORAL_TABLET | ORAL | Status: AC
  Administered 2020-07-25: 1000 mg via ORAL
  Filled 2020-07-25: qty 2

## 2020-07-25 MED ORDER — POVIDONE-IODINE 10 % EX SWAB: 2.0000 "application " | Freq: Once | CUTANEOUS | Status: DC

## 2020-07-25 MED ORDER — KETAMINE HCL 10 MG/ML IJ SOLN
INTRAMUSCULAR | Status: DC | PRN
  Administered 2020-07-25: 20 mg via INTRAVENOUS
  Administered 2020-07-25: 30 mg via INTRAVENOUS

## 2020-07-25 MED ORDER — OXYCODONE HCL 5 MG PO TABS: 5.0000 mg | ORAL_TABLET | ORAL | Status: DC | PRN

## 2020-07-25 MED ORDER — METHOCARBAMOL 1000 MG/10ML IJ SOLN
500.0000 mg | Freq: Four times a day (QID) | INTRAVENOUS | Status: DC | PRN
  Filled 2020-07-25: qty 5

## 2020-07-25 MED ORDER — FENTANYL CITRATE (PF) 100 MCG/2ML IJ SOLN
INTRAMUSCULAR | Status: AC
  Administered 2020-07-25: 25 ug via INTRAVENOUS
  Filled 2020-07-25: qty 2

## 2020-07-25 SURGICAL SUPPLY — 49 items
BAG DECANTER FOR FLEXI CONT (MISCELLANEOUS) ×3 IMPLANT
BIT DRILL INTERTAN LAG SCREW (BIT) ×3 IMPLANT
BIT DRILL SHORT 4.0 (BIT) ×1 IMPLANT
BNDG COHESIVE 4X5 TAN STRL (GAUZE/BANDAGES/DRESSINGS) IMPLANT
BNDG COHESIVE 6X5 TAN STRL LF (GAUZE/BANDAGES/DRESSINGS) ×6 IMPLANT
BNDG GAUZE ELAST 4 BULKY (GAUZE/BANDAGES/DRESSINGS) IMPLANT
CNTNR URN SCR LID CUP LEK RST (MISCELLANEOUS) ×1 IMPLANT
CONT SPEC 4OZ STRL OR WHT (MISCELLANEOUS) ×3
COVER PERINEAL POST (MISCELLANEOUS) ×3 IMPLANT
COVER SURGICAL LIGHT HANDLE (MISCELLANEOUS) ×3 IMPLANT
COVER WAND RF STERILE (DRAPES) IMPLANT
DRAPE C-ARMOR (DRAPES) ×3 IMPLANT
DRAPE STERI IOBAN 125X83 (DRAPES) ×6 IMPLANT
DRILL BIT SHORT 4.0 (BIT) ×3
DRSG MEPILEX BORDER 4X4 (GAUZE/BANDAGES/DRESSINGS) ×9 IMPLANT
DRSG MEPILEX BORDER 4X8 (GAUZE/BANDAGES/DRESSINGS) IMPLANT
DRSG PAD ABDOMINAL 8X10 ST (GAUZE/BANDAGES/DRESSINGS) IMPLANT
DURAPREP 26ML APPLICATOR (WOUND CARE) ×3 IMPLANT
ELECT REM PT RETURN 9FT ADLT (ELECTROSURGICAL) ×3
ELECTRODE REM PT RTRN 9FT ADLT (ELECTROSURGICAL) ×1 IMPLANT
GLOVE BIOGEL PI IND STRL 7.0 (GLOVE) ×1 IMPLANT
GLOVE BIOGEL PI INDICATOR 7.0 (GLOVE) ×2
GLOVE ECLIPSE 7.0 STRL STRAW (GLOVE) ×3 IMPLANT
GLOVE SKINSENSE NS SZ7.5 (GLOVE) ×4
GLOVE SKINSENSE STRL SZ7.5 (GLOVE) ×2 IMPLANT
GOWN STRL REIN XL XLG (GOWN DISPOSABLE) ×3 IMPLANT
GUIDE PIN 3.2X343 (PIN) ×2
GUIDE PIN 3.2X343MM (PIN) ×6
KIT BASIN OR (CUSTOM PROCEDURE TRAY) ×3 IMPLANT
KIT TURNOVER KIT B (KITS) ×3 IMPLANT
MANIFOLD NEPTUNE II (INSTRUMENTS) IMPLANT
NAIL TRIGEN 10MMX40CM-125 LEFT (Nail) ×3 IMPLANT
NS IRRIG 1000ML POUR BTL (IV SOLUTION) ×3 IMPLANT
PACK GENERAL/GYN (CUSTOM PROCEDURE TRAY) ×3 IMPLANT
PAD ARMBOARD 7.5X6 YLW CONV (MISCELLANEOUS) ×6 IMPLANT
PAD CAST 4YDX4 CTTN HI CHSV (CAST SUPPLIES) IMPLANT
PADDING CAST COTTON 4X4 STRL (CAST SUPPLIES)
PIN GUIDE 3.2X343MM (PIN) ×2 IMPLANT
SCREW LAG COMPR KIT 85/80 (Screw) ×3 IMPLANT
SCREW TRIGEN LOW PROF 5.0X45 (Screw) ×3 IMPLANT
STAPLER VISISTAT 35W (STAPLE) ×3 IMPLANT
SUT VIC AB 0 CT1 27 (SUTURE) ×3
SUT VIC AB 0 CT1 27XBRD ANBCTR (SUTURE) ×1 IMPLANT
SUT VIC AB 2-0 CT1 27 (SUTURE) ×6
SUT VIC AB 2-0 CT1 TAPERPNT 27 (SUTURE) ×2 IMPLANT
SYR BULB IRRIG 60ML STRL (SYRINGE) ×3 IMPLANT
TOWEL GREEN STERILE (TOWEL DISPOSABLE) ×3 IMPLANT
TOWEL GREEN STERILE FF (TOWEL DISPOSABLE) ×3 IMPLANT
WATER STERILE IRR 1000ML POUR (IV SOLUTION) IMPLANT

## 2020-07-25 NOTE — Progress Notes (Signed)
Mike [name]  QHU:[id number] DOB: 1965-11-08 DOA: 07/24/2020 PCP: [name], MD    Brief Narrative:  54 year old with a history of stage IV lung cancer and known mets to bone status post palliative chemotherapy and XRT, DM 2, OSA not on CPAP, saddle PE on chronic Eliquis, and obesity who was sent to the hospital by his orthopedic surgeon for correction of a known left hip pathologic fracture.  Patient reported pain in this hip for approximately 2 months which is gradually worsened.  There was no history of fall/trauma.  MRI 10/2 noted multiple bony metastatic deposits including left femoral neck and intertrochanteric region deposit.  Significant Events:  10/7 direct admit to [facility]  Antimicrobials:  None  DVT prophylaxis: SCDs in preparation for surgery  Subjective: Resting comfortably in bed in good spirits.  Denies chest pain shortness of breath nausea vomiting abdominal pain.  Reports ongoing pain at his hip but this is not changed.  Assessment & Plan:  Pathologic left femoral fracture Dr. Erlinda Hong is following the patient with plans to go to the OR 10/8 for surgical correction -Eliquis on hold since 10/5 -heparin bridge being utilized for now  History of saddle PE Diagnosed approximately 6 months ago -chronically on Eliquis -Heparin bridge to allow for surgery being utilized at present  Stage IV squamous cell carcinoma of the lung with mets to multiple bony sites Diagnosed March 2021 -s/p palliative chemotherapy and radiation -on Keytruda -followed by Dr. Earlie Server  Chronic cancer related pain Chronically on MS Contin and OxyIR -continue home regimen and bowel regimen as well  DM 2 A1c 7.8 -managed with Metformin alone as outpatient -follow trend  Tobacco abuse 86+ pack year history  Obstructive sleep apnea  Obesity - Body mass index is 34.87 kg/m.   Code Status: NO CODE BLUE Family Communication: Spoke with wife at bedside Status is: Inpatient  Remains  inpatient appropriate because:Inpatient level of care appropriate due to severity of illness   Dispo: The patient is from: Home              Anticipated d/c is to: Home              Anticipated d/c date is: 2 days              Patient currently is not medically stable to d/c.   Consultants:  Orthopedic surgery  Objective: Blood pressure 123/78, pulse 69, temperature 98.2 F (36.8 C), temperature source Oral, resp. rate 17, height 5\' 11"  (1.803 m), weight 113.4 kg, SpO2 99 %.  Intake/Output Summary (Last 24 hours) at 07/25/2020 0840 Last data filed at 07/25/2020 0804 Gross per 24 hour  Intake 1465.31 ml  Output 700 ml  Net 765.31 ml   Filed Weights   07/24/20 1100 07/24/20 1500  Weight: 119.7 kg 113.4 kg    Examination: General: No acute respiratory distress Lungs: Clear to auscultation bilaterally without wheezes or crackles Cardiovascular: Regular rate and rhythm without murmur gallop or rub normal S1 and S2 Abdomen: Nontender, nondistended, soft, bowel sounds positive, no rebound, no ascites, no appreciable mass Extremities: No significant cyanosis, clubbing, or edema bilateral lower extremities  CBC: Recent Labs  Lab 07/24/20 1055 07/25/20 0336  WBC 6.8 6.2  HGB 11.8* 11.0*  HCT 36.7* 34.7*  MCV 94.1 94.0  PLT 289 465   Basic Metabolic Panel: Recent Labs  Lab 07/24/20 1055 07/25/20 0336  NA 139 136  K 4.6 3.8  CL 100 100  CO2 30 29  GLUCOSE 109* 162*  BUN 7 7  CREATININE 0.71 0.76  CALCIUM 9.3 8.8*   GFR: Estimated Creatinine Clearance: 132 mL/min (by C-G formula based on SCr of 0.76 mg/dL).  Liver Function Tests: Recent Labs  Lab 07/24/20 1055 07/25/20 0336  AST 26 19  ALT 28 24  ALKPHOS 66 61  BILITOT 0.5 0.4  PROT 6.5 5.7*  ALBUMIN 2.8* 2.4*    Coagulation Profile: Recent Labs  Lab 07/24/20 1055  INR 1.1    HbA1C: Hemoglobin A1C  Date/Time Value Ref Range Status  02/22/2020 04:19 PM 7.7 (A) 4.0 - 5.6 % Final  12/05/2019 03:28  PM 7.2 (A) 4.0 - 5.6 % Final   Hgb A1c MFr Bld  Date/Time Value Ref Range Status  07/24/2020 10:55 AM 7.8 (H) 4.8 - 5.6 % Final    Comment:    (NOTE) Pre diabetes:          5.7%-6.4%  Diabetes:              >6.4%  Glycemic control for   <7.0% adults with diabetes   09/04/2019 09:55 AM 8.4 (H) 4.6 - 6.5 % Final    Comment:    Glycemic Control Guidelines for People with Diabetes:Non Diabetic:  <6%Goal of Therapy: <7%Additional Action Suggested:  >8%     CBG: Recent Labs  Lab 07/24/20 1130 07/24/20 1601 07/24/20 2015 07/25/20 0628  GLUCAP 100* 88 119* 182*    Recent Results (from the past 240 hour(s))  Respiratory Panel by RT PCR (Flu A&B, Covid) - Nasopharyngeal Swab     Status: None   Collection Time: 07/24/20 11:46 AM   Specimen: Nasopharyngeal Swab  Result Value Ref Range Status   SARS Coronavirus 2 by RT PCR NEGATIVE NEGATIVE Final    Comment: (NOTE) SARS-CoV-2 target nucleic acids are NOT DETECTED.  The SARS-CoV-2 RNA is generally detectable in upper respiratoy specimens during the acute phase of infection. The lowest concentration of SARS-CoV-2 viral copies this assay can detect is 131 copies/mL. A negative result does not preclude SARS-Cov-2 infection and should not be used as the sole basis for treatment or other patient management decisions. A negative result may occur with  improper specimen collection/handling, submission of specimen other than nasopharyngeal swab, presence of viral mutation(s) within the areas targeted by this assay, and inadequate number of viral copies (<131 copies/mL). A negative result must be combined with clinical observations, patient history, and epidemiological information. The expected result is Negative.  Fact Sheet for Patients:  PinkCheek.be  Fact Sheet for Healthcare Providers:  GravelBags.it  This test is no t yet approved or cleared by the [place] FDA and    has been authorized for detection and/or diagnosis of SARS-CoV-2 by FDA under an Emergency Use Authorization (EUA). This EUA will remain  in effect (meaning this test can be used) for the duration of the COVID-19 declaration under Section 564(b)(1) of the Act, 21 U.S.C. section 360bbb-3(b)(1), unless the authorization is terminated or revoked sooner.     Influenza A by PCR NEGATIVE NEGATIVE Final   Influenza B by PCR NEGATIVE NEGATIVE Final    Comment: (NOTE) The Xpert Xpress SARS-CoV-2/FLU/RSV assay is intended as an aid in  the diagnosis of influenza from Nasopharyngeal swab specimens and  should not be used as a sole basis for treatment. Nasal washings and  aspirates are unacceptable for Xpert Xpress SARS-CoV-2/FLU/RSV  testing.  Fact Sheet for Patients: PinkCheek.be  Fact Sheet for Healthcare Providers: GravelBags.it  This test is  not yet approved or cleared by the [place] and  has been authorized for detection and/or diagnosis of SARS-CoV-2 by  FDA under an Emergency Use Authorization (EUA). This EUA will remain  in effect (meaning this test can be used) for the duration of the  Covid-19 declaration under Section 564(b)(1) of the Act, 21  U.S.C. section 360bbb-3(b)(1), unless the authorization is  terminated or revoked. Performed at [facility], [place] [address] [place], [place] [address]   Surgical pcr screen     Status: None   Collection Time: 07/24/20 11:46 AM   Specimen: Nasal Mucosa; Nasal Swab  Result Value Ref Range Status   MRSA, PCR NEGATIVE NEGATIVE Final   Staphylococcus aureus NEGATIVE NEGATIVE Final    Comment: (NOTE) The Xpert SA Assay (FDA approved for NASAL specimens in patients 20 years of age and older), is one component of a comprehensive surveillance program. It is not intended to diagnose infection nor to guide or monitor treatment. Performed at [facility],  [place] [address] [place], [place] [address]      Scheduled Meds: . atorvastatin  20 mg Oral Daily  . Chlorhexidine Gluconate Cloth  6 each Topical Daily  . insulin aspart  0-5 Units Subcutaneous QHS  . insulin aspart  0-9 Units Subcutaneous TID WC  . morphine  30 mg Oral Q12H  . nicotine  21 mg Transdermal Daily   Continuous Infusions: . sodium chloride 75 mL/hr at 07/25/20 0042  . heparin 1,850 Units/hr (07/25/20 0006)     LOS: 1 day   Cherene Altes, MD [facility] Office  [phone] Pager - Text Page per Shea Evans  If 7PM-7AM, please contact night-coverage per Amion 07/25/2020, 8:40 AM

## 2020-07-25 NOTE — Anesthesia Procedure Notes (Signed)
Procedure Name: Intubation Date/Time: 07/25/2020 12:51 PM Performed by: Renato Shin, CRNA Pre-anesthesia Checklist: Patient identified, Emergency Drugs available, Suction available and Patient being monitored Patient Re-evaluated:Patient Re-evaluated prior to induction Oxygen Delivery Method: Circle system utilized Preoxygenation: Pre-oxygenation with 100% oxygen Induction Type: IV induction Ventilation: Mask ventilation without difficulty Laryngoscope Size: Miller and 2 Grade View: Grade I Tube type: Oral Tube size: 7.5 mm Number of attempts: 1 Airway Equipment and Method: Stylet and Oral airway Placement Confirmation: ETT inserted through vocal cords under direct vision,  positive ETCO2 and breath sounds checked- equal and bilateral Secured at: 21 cm Tube secured with: Tape Dental Injury: Teeth and Oropharynx as per pre-operative assessment

## 2020-07-25 NOTE — Transfer of Care (Signed)
Immediate Anesthesia Transfer of Care Note  Patient: Mike [name]  Procedure(s) Performed: LEFT INTERTROCHANTERIC INTRAMEDULLARY (IM) NAIL (Left Hip)  Patient Location: PACU  Anesthesia Type:General  Level of Consciousness: drowsy and patient cooperative  Airway & Oxygen Therapy: Patient Spontanous Breathing and Patient connected to face mask oxygen  Post-op Assessment: Report given to RN and Post -op Vital signs reviewed and stable  Post vital signs: Reviewed and stable  Last Vitals:  Vitals Value Taken Time  BP 135/95 07/25/20 1410  Temp    Pulse 105 07/25/20 1416  Resp 17 07/25/20 1416  SpO2 98 % 07/25/20 1416  Vitals shown include unvalidated device data.  Last Pain:  Vitals:   07/25/20 1121  TempSrc:   PainSc: 4       Patients Stated Pain Goal: 4 (32/91/91 6606)  Complications: No complications documented.

## 2020-07-25 NOTE — Progress Notes (Signed)
Placed patient on CPAP for the night via auto-mode with oxygen set at 3lpm.

## 2020-07-25 NOTE — Progress Notes (Signed)
ANTICOAGULATION CONSULT NOTE - Follow Up Consult  Pharmacy Consult for Heparin Indication: pulmonary embolus  No Known Allergies  Patient Measurements: Height: 5\' 11"  (180.3 cm) Weight: 113.4 kg (250 lb) IBW/kg (Calculated) : 75.3  Heparin Dosing Weight: 100 kg  Vital Signs: Temp: 98.3 F (36.8 C) (10/08 0321) Temp Source: Oral (10/08 0321) BP: 124/80 (10/08 0321) Pulse Rate: 100 (10/08 0321)  Labs: Recent Labs    07/24/20 1055 07/24/20 1950 07/25/20 0336  HGB 11.8*  --  11.0*  HCT 36.7*  --  34.7*  PLT 289  --  264  APTT 90* 148*  --   LABPROT 14.0  --   --   INR 1.1  --   --   HEPARINUNFRC  --  0.69 0.53  CREATININE 0.71  --  0.76    Estimated Creatinine Clearance: 132 mL/min (by C-G formula based on SCr of 0.76 mg/dL).   Medical History: Past Medical History:  Diagnosis Date  . Arthritis    neck  . Cancer, metastatic to bone ([place]) dx'd 10/2019  . Diabetes ([place])   . Fatty liver 12/05/2019   By [place] 11/2019  . GERD (gastroesophageal reflux disease)   . 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 High cholesterol   . IBS (irritable bowel syndrome)   . lung ca dx'd 10/2019   lung stage 4   . OSA (obstructive sleep apnea) 09/15/2019   Sleep study 12/2017 - severe OSA with AHI 65.6, desat to 76% rec CPAP  . Severe obesity (BMI 35.0-39.9) with comorbidity ([place]) 09/04/2019    Assessment: 54 yr old man with stage IV lung cancer was referred to [facility] for surgical evaluation of recent findings of a metastatic lesion to the proximal left femur. Pt was taking apixaban PTA for a saddle PE diagnosed ~6 months ago.  Pharmacy was consulted to start IV heparin as a bridge to surgery (planned 10/9). Last dose of apixaban was 10/5 at 0800; this may still be influencing heparin levels, so we monitored anticoagulation initially with both aPTT and heparin level. Of note, pt had elevated aPTT (90 sec) today prior to receiving heparin.  10/8 AM update:  Spoke  with Dr. Erlinda Hong via phone this AM, requested to turn heparin drip off at 0930 this AM  Goal of Therapy:  Heparin level 0.3-0.7 units/ml  Monitor platelets by anticoagulation protocol: Yes   Plan:  Stop time placed on heparin for 0930 this AM per Dr. Erlinda Hong Nursing order placed  F/U anti-coagulation plan post-op  Narda Bonds, PharmD, BCPS Clinical Pharmacist Phone: [phone]

## 2020-07-25 NOTE — Op Note (Signed)
Date of Surgery: 07/25/2020  INDICATIONS: Mike [name] is a 54 y.o.-year-old male who has an impending pathologic fracture of left proximal femur from metastatic lung cancer. The risks and benefits of the procedure discussed with the patient prior to the procedure and all questions were answered; consent was obtained.  PREOPERATIVE DIAGNOSIS: Impending pathologic fracture of left proximal femur  POSTOPERATIVE DIAGNOSIS: Same   PROCEDURE: Prophylactic stabilization of impending pathologic fracture of left intertrochanteric femur. CPT [phone]   SURGEON: N. Eduard Roux, M.D.   ASSIST: Mike [name], [place]; necessary for the timely completion of procedure and due to complexity of procedure.  ANESTHESIA: general   IV FLUIDS AND URINE: See anesthesia record   ESTIMATED BLOOD LOSS: 100 cc  IMPLANTS: [employer] InterTAN 10 x 40  DRAINS: None.   COMPLICATIONS: see description of procedure.   DESCRIPTION OF PROCEDURE: The patient was brought to the operating room and placed supine on the operating table. [name] had been signed prior to the procedure. The patient had the anesthesia placed by the anesthesiologist. The prep verification and incision time-outs were performed to confirm that this was the correct patient, site, side and location. The patient had an SCD on the opposite lower extremity. The patient did receive antibiotics prior to the incision and was re-dosed during the procedure as needed at indicated intervals. The patient was positioned on the fracture table with the table in traction and internal rotation to reduce the hip. The well leg was placed in a scissor position and all bony prominences were well-padded. The patient had the lower extremity prepped and draped in the standard surgical fashion. The incision was made 4 finger breadths superior to the greater trochanter. A guide pin was inserted into the tip of the greater trochanter under fluoroscopic  guidance. An opening reamer was used to gain access to the femoral canal. The reamings were sent to pathology for gross specimen.  The nail length was measured and inserted down the femoral canal to its proper depth. The appropriate version of insertion for the lag screw was found under fluoroscopy. A pin was inserted up the femoral neck through the jig. Then, a second antirotation pin was inserted inferior to the first pin. The length of the lag screw was then measured. The lag screw was inserted as near to center-center in the head as possible. The antirotation pin was then taken out and an interdigitating compression screw was placed in its place. The leg was taken out of traction, then the interdigitating compression screw was used to compress across the fracture. Compression was visualized on serial xrays.  A distal interlocking screw was placed using the perfect circle technique.  The wound was copiously irrigated with saline and the subcutaneous layer closed with 2.0 vicryl and the skin was reapproximated with staples. The wounds were cleaned and dried a final time and a sterile dressing was placed. The hip was taken through a range of motion at the end of the case under fluoroscopic imaging to visualize the approach-withdraw phenomenon and confirm implant length in the head. The patient was then awakened from anesthesia and taken to the recovery room in stable condition. All counts were correct at the end of the case.   POSTOPERATIVE PLAN: The patient will be weight bearing as tolerated and will return in 2 weeks for staple removal and the patient will receive DVT prophylaxis based on other medications, activity level, and risk ratio of bleeding to thrombosis.   Ashok Cordia  Erlinda Hong, MD [facility] 1:44 PM

## 2020-07-25 NOTE — Anesthesia Preprocedure Evaluation (Addendum)
Anesthesia Evaluation  Patient identified by MRN, date of birth, ID band Patient awake    Reviewed: Allergy & Precautions, NPO status , Patient's Chart, lab work & pertinent test results  History of Anesthesia Complications Negative for: history of anesthetic complications  Airway Mallampati: III  TM Distance: >3 FB Neck ROM: Full    Dental no notable dental hx. (+) Dental Advisory Given   Pulmonary sleep apnea and Continuous Positive Airway Pressure Ventilation , Current Smoker and Patient abstained from smoking., former smoker,  Lung Cancer Pulmonary embolism   Pulmonary exam normal        Cardiovascular negative cardio ROS Normal cardiovascular exam+ Valvular Problems/Murmurs   IMPRESSIONS    1. Normal LV function; mildly dilated aortic root; mild RVE with severe  RV dysfunction.  2. Left ventricular ejection fraction, by estimation, is 55 to 60%. The  left ventricle has normal function. The left ventricle has no regional  wall motion abnormalities. Left ventricular diastolic parameters were  normal.  3. Right ventricular systolic function is severely reduced. The right  ventricular size is mildly enlarged.  4. The mitral valve is normal in structure. No evidence of mitral valve  regurgitation. No evidence of mitral stenosis.  5. The aortic valve is tricuspid. Aortic valve regurgitation is not  visualized. Mild aortic valve sclerosis is present, with no evidence of  aortic valve stenosis.  6. Aortic dilatation noted. There is mild dilatation of the aortic root  measuring 40 mm.  7. The inferior vena cava is dilated in size with <50% respiratory  variability, suggesting right atrial pressure of 15 mmHg.    Neuro/Psych PSYCHIATRIC DISORDERS Depression negative neurological ROS     GI/Hepatic GERD  Medicated and Controlled,Fatty liver   Endo/Other  diabetes, Well Controlled, Type 2, Oral Hypoglycemic  AgentsObesity Hypercholesterolemia  Renal/GU negative Renal ROS  negative genitourinary   Musculoskeletal  (+) Arthritis , Osteoarthritis,  Rib pain- bony metastasis Pathologic fracture   Abdominal (+) - obese,   Peds  Hematology negative hematology ROS (+)   Anesthesia Other Findings   Reproductive/Obstetrics ED                           Anesthesia Physical  Anesthesia Plan  ASA: III  Anesthesia Plan: General   Post-op Pain Management:    Induction: Intravenous  PONV Risk Score and Plan: 2 and Ondansetron, Treatment may vary due to age or medical condition and Dexamethasone  Airway Management Planned: Oral ETT  Additional Equipment:   Intra-op Plan:   Post-operative Plan: Extubation in OR  Informed Consent: I have reviewed the patients History and Physical, chart, labs and discussed the procedure including the risks, benefits and alternatives for the proposed anesthesia with the patient or authorized representative who has indicated his/her understanding and acceptance.   Patient has DNR.  Discussed DNR with patient and Suspend DNR.   Dental advisory given  Plan Discussed with: CRNA, Anesthesiologist and Surgeon  Anesthesia Plan Comments:        Anesthesia Quick Evaluation

## 2020-07-25 NOTE — H&P (Signed)
H&P update  The surgical history has been reviewed and remains accurate without interval change.  The patient was re-examined and patient's physiologic condition has not changed significantly in the last 30 days. The condition still exists that makes this procedure necessary. The treatment plan remains the same, without new options for care.  No new pharmacological allergies or types of therapy has been initiated that would change the plan or the appropriateness of the plan.  The patient and/or family understand the potential benefits and risks.  I have notified patient that his surgery will be scheduled for 12:30 pm today.  Heparin drip will be turned off 3 hours prior to surgery.  Continue NPO.  Azucena Cecil, MD 07/25/2020 7:41 AM

## 2020-07-25 NOTE — Anesthesia Postprocedure Evaluation (Signed)
Anesthesia Post Note  Patient: Mike [name]  Procedure(s) Performed: LEFT INTERTROCHANTERIC INTRAMEDULLARY (IM) NAIL (Left Hip)     Patient location during evaluation: PACU Anesthesia Type: General Level of consciousness: sedated Pain management: pain level controlled Vital Signs Assessment: post-procedure vital signs reviewed and stable Respiratory status: spontaneous breathing and respiratory function stable Cardiovascular status: stable Postop Assessment: no apparent nausea or vomiting Anesthetic complications: no   No complications documented.  Last Vitals:  Vitals:   07/25/20 1510 07/25/20 1523  BP: 131/88 (!) 132/96  Pulse: (!) 104 (!) 103  Resp: 16 16  Temp:  37.1 C  SpO2: 92% 94%                  Chanese Hartsough DANIEL

## 2020-07-26 LAB — CBC
HCT: 36.4 % — ABNORMAL LOW (ref 39.0–52.0)
Hemoglobin: 11.6 g/dL — ABNORMAL LOW (ref 13.0–17.0)
MCH: 29.2 pg (ref 26.0–34.0)
MCHC: 31.9 g/dL (ref 30.0–36.0)
MCV: 91.7 fL (ref 80.0–100.0)
Platelets: 254 10*3/uL (ref 150–400)
RBC: 3.97 MIL/uL — ABNORMAL LOW (ref 4.22–5.81)
RDW: 12.7 % (ref 11.5–15.5)
WBC: 9.1 10*3/uL (ref 4.0–10.5)
nRBC: 0 % (ref 0.0–0.2)

## 2020-07-26 LAB — BASIC METABOLIC PANEL
Anion gap: 8 (ref 5–15)
BUN: 8 mg/dL (ref 6–20)
CO2: 28 mmol/L (ref 22–32)
Calcium: 9 mg/dL (ref 8.9–10.3)
Chloride: 101 mmol/L (ref 98–111)
Creatinine, Ser: 0.68 mg/dL (ref 0.61–1.24)
GFR, Estimated: >60 mL/min (ref >60)
Glucose, Bld: 151 mg/dL — ABNORMAL HIGH (ref 70–99)
Potassium: 4.5 mmol/L (ref 3.5–5.1)
Sodium: 137 mmol/L (ref 135–145)

## 2020-07-26 LAB — GLUCOSE, CAPILLARY
Glucose-Capillary: 139 mg/dL — ABNORMAL HIGH (ref 70–99)
Glucose-Capillary: 115 mg/dL — ABNORMAL HIGH (ref 70–99)
Glucose-Capillary: 127 mg/dL — ABNORMAL HIGH (ref 70–99)
Glucose-Capillary: 182 mg/dL — ABNORMAL HIGH (ref 70–99)

## 2020-07-26 NOTE — Progress Notes (Signed)
10/08 2000 Pt oxygen was bumped from 3.5liters to 2L. Pt tolerated well.  10/08 2300 Pt removed his O2, he was monitored and tolerated well.

## 2020-07-26 NOTE — Evaluation (Signed)
Physical Therapy Evaluation Patient Details Name: Mike [name] MRN: [id number] DOB: 1966-09-14 Today's Date: 07/26/2020   History of Present Illness  Pt is a 54 y.o. 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 Pt now s/p prophylactic stabilization of impending pathologic fracture of left intertrochanteric femur.   Clinical Impression  Pt reports improvement in pain and mobility post op. Requiring min assist for transfers, ambulating 25 feet with a walker at a min guard assist level. Initiated HEP for strengthening. Suspect pt will progress well and has good support at home. Will need stair training prior to discharge.     Follow Up Recommendations Home health PT;Supervision for mobility/OOB    Equipment Recommendations  Rolling walker with 5" wheels    Recommendations for Other Services       Precautions / Restrictions Precautions Precautions: Fall Restrictions Weight Bearing Restrictions: Yes LLE Weight Bearing: Weight bearing as tolerated      Mobility  Bed Mobility Overal bed mobility: Needs Assistance Bed Mobility: Supine to Sit     Supine to sit: Min assist     General bed mobility comments: MinA for LLE negotiation off edge of bed  Transfers Overall transfer level: Needs assistance Equipment used: Rolling walker (2 wheeled) Transfers: Sit to/from Stand Sit to Stand: Min assist         General transfer comment: MinA to rise from elevated surfaces of edge of bed and toilet riser  Ambulation/Gait Ambulation/Gait assistance: Min guard Gait Distance (Feet): 25 Feet Assistive device: Rolling walker (2 wheeled) Gait Pattern/deviations: Step-to pattern;Decreased stance time - left;Decreased weight shift to left;Antalgic Gait velocity: decreased Gait velocity  interpretation: <1.31 ft/sec, indicative of household ambulator General Gait Details: Cues for walker use, sequencing, technique. Min guard for Scientist, research (medical)    Modified Rankin (Stroke Patients Only)       Balance Overall balance assessment: Needs assistance Sitting-balance support: Feet supported Sitting balance-Leahy Scale: Good     Standing balance support: Bilateral upper extremity supported Standing balance-Leahy Scale: Poor                               Pertinent Vitals/Pain Pain Assessment: Faces Faces Pain Scale: Hurts even more Pain Location: L hip Pain Descriptors / Indicators: Grimacing;Operative site guarding Pain Intervention(s): Limited activity within patient's tolerance;Monitored during session;Premedicated before session    Home Living Family/patient expects to be discharged to:: Private residence Living Arrangements: Spouse/significant other;Other relatives (brother, fiance ) Available Help at Discharge: Family;Available 24 hours/day Type of Home: Mobile home Home Access: Stairs to enter Entrance Stairs-Rails: Right;Left Entrance Stairs-Number of Steps: 4 Home Layout: One level Home Equipment: Cane - quad;Shower seat      Prior Function Level of Independence: Needs assistance   Gait / Transfers Assistance Needed: using cane  ADL's / Homemaking Assistance Needed: assist with LB dressing/bathing in past week        Hand Dominance        Extremity/Trunk Assessment   Upper Extremity Assessment Upper Extremity Assessment: Overall WFL for tasks assessed    Lower Extremity Assessment Lower Extremity Assessment: LLE deficits/detail LLE Deficits / Details: s/p hip IMN. Grossly 2/5, atrophy noted  Communication   Communication: No difficulties  Cognition Arousal/Alertness: Awake/alert Behavior During Therapy: WFL for tasks assessed/performed Overall Cognitive Status: Within Functional  Limits for tasks assessed                                        General Comments      Exercises General Exercises - Lower Extremity Ankle Circles/Pumps: Left;10 reps;Supine Quad Sets: Left;10 reps;Sidelying Long Arc Quad: Left;5 reps;Seated   Assessment/Plan    PT Assessment Patient needs continued PT services  PT Problem List Decreased strength;Decreased range of motion;Decreased activity tolerance;Decreased balance;Decreased mobility;Pain       PT Treatment Interventions DME instruction;Gait training;Stair training;Functional mobility training;Therapeutic activities;Therapeutic exercise;Balance training;Patient/family education    PT Goals (Current goals can be found in the Care Plan section)  Acute Rehab PT Goals Patient Stated Goal: get stronger, less pain PT Goal Formulation: With patient Time For Goal Achievement: 08/09/20 Potential to Achieve Goals: Good    Frequency Min 5X/week   Barriers to discharge        Co-evaluation               AM-PAC PT "6 Clicks" Mobility  Outcome Measure Help needed turning from your back to your side while in a flat bed without using bedrails?: None Help needed moving from lying on your back to sitting on the side of a flat bed without using bedrails?: A Little Help needed moving to and from a bed to a chair (including a wheelchair)?: A Little Help needed standing up from a chair using your arms (e.g., wheelchair or bedside chair)?: A Little Help needed to walk in hospital room?: A Little Help needed climbing 3-5 steps with a railing? : A Lot 6 Click Score: 18    End of Session Equipment Utilized During Treatment: Gait belt Activity Tolerance: Patient tolerated treatment well Patient left: in chair;with call bell/phone within reach Nurse Communication: Mobility status PT Visit Diagnosis: Pain;Difficulty in walking, not elsewhere classified (R26.2) Pain - Right/Left: Left Pain - part of body: Hip     Time: 1216-2446 PT Time Calculation (min) (ACUTE ONLY): 35 min   Charges:   PT Evaluation $PT Eval Moderate Complexity: 1 Mod PT Treatments $Gait Training: 8-22 mins        Mike [name], PT, DPT Acute Rehabilitation Services Pager [phone] Office [phone]   Mike [name] 07/26/2020, 2:44 PM

## 2020-07-26 NOTE — Progress Notes (Signed)
Pt refuseing CPAP for the night.

## 2020-07-26 NOTE — Progress Notes (Signed)
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  Patient reported pain in this hip for approximately 2 months which is gradually worsened.  There was no history of fall/trauma.  MRI 10/2 noted multiple bony metastatic deposits including left femoral neck and intertrochanteric region deposit.  Significant Events:  10/7 direct admit to [facility] 10/8 prophylactic stabilization of impending pathologic fracture left intertrochanteric femur in OR  Antimicrobials:  None  DVT prophylaxis: SCDs in preparation for surgery  Subjective: Afebrile.  Vital signs stable with exception to very mild occasional tachycardia.  Oxygen saturations 93% on room air. No new complaints.   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 Earlie Server  Chronic cancer related pain Chronically on MS Contin and OxyIR -continue home regimen and bowel regimen as well  DM 2 A1c 7.8 -managed with Metformin alone as outpatient -CBG reasonably controlled at this time  Tobacco abuse 86+ pack year history  Obstructive sleep apnea Continue usual home CPAP regimen nightly  Obesity - Body mass index is 34.87 kg/m.   Code Status: NO CODE BLUE Family Communication:  no family present at time of exam  Status is: Inpatient  Remains inpatient appropriate because:Inpatient level of care appropriate due to severity of illness   Dispo: The patient is from: Home              Anticipated d/c is to: Home              Anticipated d/c date is: 2 days              Patient currently is not medically stable to d/c.   Consultants:  Orthopedic surgery  Objective: Blood pressure (!) 126/93, pulse (!) 106, temperature 97.8 F (36.6 C), temperature source Oral, resp. rate 17, height 5\' 11"  (1.803 m), weight 113.4 kg, SpO2 93 %.  Intake/Output Summary (Last 24 hours) at 07/26/2020 0840 Last data filed at 07/26/2020 0418 Gross per 24 hour  Intake 2452.93 ml  Output 3500 ml  Net -1047.07 ml   Filed Weights   07/24/20 1100 07/24/20 1500  Weight: 119.7 kg 113.4 kg    Examination: General: No acute respiratory distress Lungs: Clear to auscultation bilaterally  Cardiovascular: RRR - no M  Abdomen: NT/ND, soft, bs+, no mass  Extremities: No cyanosis or signif edema bilateral lower extremities  CBC: Recent Labs  Lab 07/24/20 1055 07/25/20 0336 07/26/20 0408  WBC 6.8 6.2 9.1  HGB 11.8* 11.0* 11.6*  HCT 36.7* 34.7* 36.4*  MCV 94.1 94.0 91.7  PLT 289 264 937   Basic Metabolic Panel: Recent Labs  Lab 07/24/20 1055 07/25/20 0336 07/26/20 0408  NA 139 136 137  K 4.6 3.8 4.5  CL 100 100 101  CO2 30 29 28   GLUCOSE 109*  162* 151*  BUN 7 7 8   CREATININE 0.71 0.76 0.68  CALCIUM 9.3 8.8* 9.0   GFR: Estimated Creatinine Clearance: 132 mL/min (by C-G formula based on SCr of 0.68 mg/dL).  Liver Function Tests: Recent Labs  Lab 07/24/20 1055 07/25/20 0336  AST 26 19  ALT 28 24  ALKPHOS 66 61  BILITOT 0.5 0.4  PROT 6.5 5.7*  ALBUMIN 2.8* 2.4*    Coagulation Profile: Recent Labs  Lab 07/24/20 1055  INR 1.1    HbA1C: Hemoglobin A1C  Date/Time Value Ref Range Status  02/22/2020 04:19 PM 7.7 (A) 4.0 - 5.6 % Final  12/05/2019 03:28 PM 7.2  (A) 4.0 - 5.6 % Final   Hgb A1c MFr Bld  Date/Time Value Ref Range Status  07/24/2020 10:55 AM 7.8 (H) 4.8 - 5.6 % Final    Comment:    (NOTE) Pre diabetes:          5.7%-6.4%  Diabetes:              >6.4%  Glycemic control for   <7.0% adults with diabetes   09/04/2019 09:55 AM 8.4 (H) 4.6 - 6.5 % Final    Comment:    Glycemic Control Guidelines for People with Diabetes:Non Diabetic:  <6%Goal of Therapy: <7%Additional Action Suggested:  >8%     CBG: Recent Labs  Lab 07/25/20 1127 07/25/20 1411 07/25/20 1554 07/25/20 2035 07/26/20 0634  GLUCAP 97 130* 160* 208* 139*    Recent Results (from the past 240 hour(s))  Respiratory Panel by RT PCR (Flu A&B, Covid) - Nasopharyngeal Swab     Status: None   Collection Time: 07/24/20 11:46 AM   Specimen: Nasopharyngeal Swab  Result Value Ref Range Status   SARS Coronavirus 2 by RT PCR NEGATIVE NEGATIVE Final    Comment: (NOTE) SARS-CoV-2 target nucleic acids are NOT DETECTED.  The SARS-CoV-2 RNA is generally detectable in upper respiratoy specimens during the acute phase of infection. The lowest concentration of SARS-CoV-2 viral copies this assay can detect is 131 copies/mL. A negative result does not preclude SARS-Cov-2 infection and should not be used as the sole basis for treatment or other patient management decisions. A negative result may occur with  improper specimen collection/handling, submission of specimen other than nasopharyngeal swab, presence of viral mutation(s) within the areas targeted by this assay, and inadequate number of viral copies (<131 copies/mL). A negative result must be combined with clinical observations, patient history, and epidemiological information. The expected result is Negative.  Fact Sheet for Patients:  PinkCheek.be  Fact Sheet for Healthcare Providers:  GravelBags.it  This test is no t yet approved or cleared by the [place] FDA and  has been authorized for detection and/or diagnosis of SARS-CoV-2 by FDA under an Emergency Use Authorization (EUA). This EUA will remain  in effect (meaning this test can be used) for the duration of the COVID-19 declaration under Section 564(b)(1) of the Act, 21 U.S.C. section 360bbb-3(b)(1), unless the authorization is terminated or revoked sooner.     Influenza A by PCR NEGATIVE NEGATIVE Final   Influenza B by PCR NEGATIVE NEGATIVE Final    Comment: (NOTE) The Xpert Xpress SARS-CoV-2/FLU/RSV assay is intended as an aid in  the diagnosis of influenza from Nasopharyngeal swab specimens and  should not be used as a sole basis for treatment. Nasal washings and  aspirates are unacceptable for Xpert Xpress SARS-CoV-2/FLU/RSV  testing.  Fact Sheet for Patients: PinkCheek.be  Fact Sheet for Healthcare Providers: GravelBags.it  This test is not yet approved or cleared by the [place] and  has been authorized for detection and/or diagnosis of SARS-CoV-2 by  FDA under an Emergency Use Authorization (EUA). This EUA will remain  in effect (meaning this test can be used) for the duration of the  Covid-19 declaration under Section 564(b)(1) of the Act, 21  U.S.C. section 360bbb-3(b)(1), unless the authorization is  terminated or revoked. Performed at [facility], [place] [address], [place], [place] [address]   Surgical pcr screen     Status: None   Collection Time: 07/24/20 11:46 AM   Specimen: Nasal Mucosa; Nasal Swab  Result Value Ref Range Status   MRSA, PCR NEGATIVE NEGATIVE Final   Staphylococcus aureus NEGATIVE NEGATIVE Final    Comment: (NOTE) The Xpert SA Assay (FDA approved for NASAL specimens in patients 56 years of age and older), is one component of a comprehensive surveillance program. It is not intended to diagnose infection nor to guide or monitor treatment. Performed at [facility], [place] [address], [place], [place] [address]      Scheduled Meds:  acetaminophen  1,000 mg Oral Q6H   apixaban  5 mg Oral BID   atorvastatin  20 mg Oral Daily   Chlorhexidine Gluconate Cloth  6 each Topical Daily   docusate sodium  100 mg Oral BID   insulin aspart  0-5 Units Subcutaneous QHS   insulin aspart  0-9 Units Subcutaneous TID WC   morphine  30 mg Oral Q12H   nicotine  21 mg Transdermal Daily   Continuous Infusions:  sodium chloride 75 mL/hr at 07/25/20 1727   methocarbamol (ROBAXIN) IV       LOS: 2 days   Cherene Altes, MD [facility] Office  [phone] Pager - Text Page per Shea Evans  If 7PM-7AM, please contact night-coverage per Amion 07/26/2020, 8:40 AM

## 2020-07-27 LAB — BASIC METABOLIC PANEL
Anion gap: 9 (ref 5–15)
BUN: 9 mg/dL (ref 6–20)
CO2: 28 mmol/L (ref 22–32)
Calcium: 8.7 mg/dL — ABNORMAL LOW (ref 8.9–10.3)
Chloride: 99 mmol/L (ref 98–111)
Creatinine, Ser: 0.76 mg/dL (ref 0.61–1.24)
GFR, Estimated: >60 mL/min (ref >60)
Glucose, Bld: 164 mg/dL — ABNORMAL HIGH (ref 70–99)
Potassium: 4.3 mmol/L (ref 3.5–5.1)
Sodium: 136 mmol/L (ref 135–145)

## 2020-07-27 LAB — CBC
HCT: 37.5 % — ABNORMAL LOW (ref 39.0–52.0)
Hemoglobin: 12.3 g/dL — ABNORMAL LOW (ref 13.0–17.0)
MCH: 30.4 pg (ref 26.0–34.0)
MCHC: 32.8 g/dL (ref 30.0–36.0)
MCV: 92.6 fL (ref 80.0–100.0)
Platelets: 248 10*3/uL (ref 150–400)
RBC: 4.05 MIL/uL — ABNORMAL LOW (ref 4.22–5.81)
RDW: 13.3 % (ref 11.5–15.5)
WBC: 8.9 10*3/uL (ref 4.0–10.5)
nRBC: 0 % (ref 0.0–0.2)

## 2020-07-27 LAB — GLUCOSE, CAPILLARY
Glucose-Capillary: 159 mg/dL — ABNORMAL HIGH (ref 70–99)
Glucose-Capillary: 192 mg/dL — ABNORMAL HIGH (ref 70–99)
Glucose-Capillary: 84 mg/dL (ref 70–99)
Glucose-Capillary: 141 mg/dL — ABNORMAL HIGH (ref 70–99)

## 2020-07-27 MED ORDER — OXYCODONE HCL 5 MG PO TABS: 5.0000 mg | ORAL_TABLET | ORAL | Status: DC | PRN

## 2020-07-27 MED ORDER — OXYCODONE HCL 5 MG PO TABS
10.0000 mg | ORAL_TABLET | ORAL | Status: DC | PRN
  Administered 2020-07-27 – 2020-07-28 (×5): 15 mg via ORAL
  Filled 2020-07-27 (×5): qty 3

## 2020-07-27 MED ORDER — HYDROMORPHONE HCL 1 MG/ML IJ SOLN
0.5000 mg | INTRAMUSCULAR | Status: DC | PRN
  Administered 2020-07-27 – 2020-07-28 (×3): 1 mg via INTRAVENOUS
  Administered 2020-07-28: 0.5 mg via INTRAVENOUS
  Filled 2020-07-27 (×2): qty 1
  Filled 2020-07-27: qty 0.5
  Filled 2020-07-27: qty 1

## 2020-07-27 NOTE — Progress Notes (Signed)
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  Patient reported pain in this hip for approximately 2 months which is gradually worsened.  There was no history of fall/trauma.  MRI 10/2 noted multiple bony metastatic deposits including left femoral neck and intertrochanteric region deposit.  Significant Events:  10/7 direct admit to [facility] 10/8 prophylactic stabilization of impending pathologic fracture left intertrochanteric femur in OR  Antimicrobials:  None  DVT prophylaxis: eliquis   Subjective: Vital signs stable.  Afebrile.  No new complaints.  Is experiencing significant pain in the hip which is returning prior to his next available dose of pain medication.  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 Earlie Server  Chronic cancer related pain Chronically on MS Contin and OxyIR -continue home regimen and bowel regimen as well  DM 2 A1c 7.8 -managed with Metformin alone as outpatient -CBG reasonably controlled   Tobacco abuse 86+ pack year history  Obstructive sleep apnea Continue usual home CPAP regimen nightly  Obesity - Body mass index is 34.87 kg/m.   Code Status: NO CODE BLUE Family Communication: no  family present at time of exam  Status is: Inpatient  Remains inpatient appropriate because:Inpatient level of care appropriate due to severity of illness   Dispo: The patient is from: Home              Anticipated d/c is to: Home              Anticipated d/c date is: 2 days              Patient currently is not medically stable to d/c.   Consultants:  Orthopedic surgery  Objective: Blood pressure 123/85, pulse 80, temperature 98 F (36.7 C), temperature source Oral, resp. rate 17, height 5\' 11"  (1.803 m), weight 113.4 kg, SpO2 95 %.  Intake/Output Summary (Last 24 hours) at 07/27/2020 0928 Last data filed at 07/27/2020 0500 Gross per 24 hour  Intake --  Output 1200 ml  Net -1200 ml   Filed Weights   07/24/20 1100 07/24/20 1500  Weight: 119.7 kg 113.4 kg    Examination: General: No acute respiratory distress Lungs: Clear to auscultation B Cardiovascular: RRR Abdomen: NT/ND, soft, bs+, no mass  Extremities: No signif edema bilateral lower extremities  CBC: Recent Labs  Lab 07/25/20 0336 07/26/20 0408 07/27/20 0401  WBC 6.2 9.1 8.9  HGB 11.0* 11.6* 12.3*  HCT 34.7* 36.4* 37.5*  MCV 94.0 91.7 92.6  PLT 264 254 829   Basic Metabolic Panel: Recent Labs  Lab 07/25/20 0336 07/26/20 0408 07/27/20 0401  NA 136 137 136  K 3.8 4.5 4.3  CL 100 101 99  CO2 29 28 28   GLUCOSE 162* 151* 164*  BUN 7 8 9   CREATININE  0.76 0.68 0.76  CALCIUM 8.8* 9.0 8.7*   GFR: Estimated Creatinine Clearance: 132 mL/min (by C-G formula based on SCr of 0.76 mg/dL).  Liver Function Tests: Recent Labs  Lab 07/24/20 1055 07/25/20 0336  AST 26 19  ALT 28 24  ALKPHOS 66 61  BILITOT 0.5 0.4  PROT 6.5 5.7*  ALBUMIN 2.8* 2.4*    Coagulation Profile: Recent Labs  Lab 07/24/20 1055  INR 1.1    HbA1C: Hemoglobin A1C  Date/Time Value Ref Range Status  02/22/2020 04:19 PM 7.7 (A) 4.0 - 5.6 % Final  12/05/2019 03:28 PM 7.2 (A) 4.0 - 5.6 % Final   Hgb A1c MFr Bld  Date/Time  Value Ref Range Status  07/24/2020 10:55 AM 7.8 (H) 4.8 - 5.6 % Final    Comment:    (NOTE) Pre diabetes:          5.7%-6.4%  Diabetes:              >6.4%  Glycemic control for   <7.0% adults with diabetes   09/04/2019 09:55 AM 8.4 (H) 4.6 - 6.5 % Final    Comment:    Glycemic Control Guidelines for People with Diabetes:Non Diabetic:  <6%Goal of Therapy: <7%Additional Action Suggested:  >8%     CBG: Recent Labs  Lab 07/26/20 0634 07/26/20 1127 07/26/20 1648 07/26/20 2026 07/27/20 0648  GLUCAP 139* 115* 127* 182* 159*    Recent Results (from the past 240 hour(s))  Respiratory Panel by RT PCR (Flu A&B, Covid) - Nasopharyngeal Swab     Status: None   Collection Time: 07/24/20 11:46 AM   Specimen: Nasopharyngeal Swab  Result Value Ref Range Status   SARS Coronavirus 2 by RT PCR NEGATIVE NEGATIVE Final    Comment: (NOTE) SARS-CoV-2 target nucleic acids are NOT DETECTED.  The SARS-CoV-2 RNA is generally detectable in upper respiratoy specimens during the acute phase of infection. The lowest concentration of SARS-CoV-2 viral copies this assay can detect is 131 copies/mL. A negative result does not preclude SARS-Cov-2 infection and should not be used as the sole basis for treatment or other patient management decisions. A negative result may occur with  improper specimen collection/handling, submission of specimen other than nasopharyngeal swab, presence of viral mutation(s) within the areas targeted by this assay, and inadequate number of viral copies (<131 copies/mL). A negative result must be combined with clinical observations, patient history, and epidemiological information. The expected result is Negative.  Fact Sheet for Patients:  PinkCheek.be  Fact Sheet for Healthcare Providers:  GravelBags.it  This test is no t yet approved or cleared by the [place] FDA and  has been authorized for detection  and/or diagnosis of SARS-CoV-2 by FDA under an Emergency Use Authorization (EUA). This EUA will remain  in effect (meaning this test can be used) for the duration of the COVID-19 declaration under Section 564(b)(1) of the Act, 21 U.S.C. section 360bbb-3(b)(1), unless the authorization is terminated or revoked sooner.     Influenza A by PCR NEGATIVE NEGATIVE Final   Influenza B by PCR NEGATIVE NEGATIVE Final    Comment: (NOTE) The Xpert Xpress SARS-CoV-2/FLU/RSV assay is intended as an aid in  the diagnosis of influenza from Nasopharyngeal swab specimens and  should not be used as a sole basis for treatment. Nasal washings and  aspirates are unacceptable for Xpert Xpress SARS-CoV-2/FLU/RSV  testing.  Fact Sheet for Patients: PinkCheek.be  Fact Sheet for Healthcare Providers: GravelBags.it  This test is not yet approved or cleared  by the [place] and  has been authorized for detection and/or diagnosis of SARS-CoV-2 by  FDA under an Emergency Use Authorization (EUA). This EUA will remain  in effect (meaning this test can be used) for the duration of the  Covid-19 declaration under Section 564(b)(1) of the Act, 21  U.S.C. section 360bbb-3(b)(1), unless the authorization is  terminated or revoked. Performed at [facility], [place] [address] [place], [place] [address]   Surgical pcr screen     Status: None   Collection Time: 07/24/20 11:46 AM   Specimen: Nasal Mucosa; Nasal Swab  Result Value Ref Range Status   MRSA, PCR NEGATIVE NEGATIVE Final   Staphylococcus aureus NEGATIVE NEGATIVE Final    Comment: (NOTE) The Xpert SA Assay (FDA approved for NASAL specimens in patients 91 years of age and older), is one component of a comprehensive surveillance program. It is not intended to diagnose infection nor to guide or monitor treatment. Performed at [facility], [place] [address], [place],  [place] [address]      Scheduled Meds: . apixaban  5 mg Oral BID  . atorvastatin  20 mg Oral Daily  . Chlorhexidine Gluconate Cloth  6 each Topical Daily  . docusate sodium  100 mg Oral BID  . insulin aspart  0-5 Units Subcutaneous QHS  . insulin aspart  0-9 Units Subcutaneous TID WC  . morphine  30 mg Oral Q12H  . nicotine  21 mg Transdermal Daily   Continuous Infusions: . sodium chloride 75 mL/hr at 07/26/20 0933  . methocarbamol (ROBAXIN) IV       LOS: 3 days   Cherene Altes, MD [facility] Office  [phone] Pager - Text Page per Amion  If 7PM-7AM, please contact night-coverage per Amion 07/27/2020, 9:28 AM

## 2020-07-27 NOTE — Progress Notes (Signed)
Pt refusing CPAP tonight.

## 2020-07-27 NOTE — Plan of Care (Signed)

## 2020-07-27 NOTE — Progress Notes (Signed)
Physical Therapy Treatment Patient Details Name: Mike [name] MRN: [id number] DOB: Nov 03, 1965 Today's Date: 07/27/2020    History of Present Illness Pt is a 55 y.o. 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 Pt now s/p prophylactic stabilization of impending pathologic fracture of left intertrochanteric femur.     PT Comments    Pt tolerated treatment well despite L hip pain. Pt with improved bed mobility and transfer quality, requiring reduced physical assistance from PT. Pt demonstrates some improvement in activity tolerance as well. Pt will benefit from continued acute PT POC to assess transfers from lower surfaces to prepare pt for standing from wheelchair, and to improve activity tolerance further. PT continues to recommend discharge home with HHPT and a RW, pt reports he already has a prescription for a wheelchair.   Follow Up Recommendations  Home health PT;Supervision for mobility/OOB     Equipment Recommendations  Rolling walker with 5" wheels    Recommendations for Other Services       Precautions / Restrictions Precautions Precautions: Fall Restrictions Weight Bearing Restrictions: Yes LLE Weight Bearing: Weight bearing as tolerated    Mobility  Bed Mobility Overal bed mobility: Needs Assistance Bed Mobility: Supine to Sit;Sit to Supine     Supine to sit: Supervision;HOB elevated Sit to supine: Supervision;HOB elevated   General bed mobility comments: pt utilizing rails to reposition body and pull into sitting  Transfers Overall transfer level: Needs assistance Equipment used: Rolling walker (2 wheeled) Transfers: Sit to/from Stand Sit to Stand: Min guard;From elevated surface            Ambulation/Gait Ambulation/Gait assistance: Min guard Gait  Distance (Feet): 30 Feet Assistive device: Rolling walker (2 wheeled) Gait Pattern/deviations: Step-to pattern Gait velocity: reduced Gait velocity interpretation: <1.31 ft/sec, indicative of household ambulator General Gait Details: pt with short step to gait, attempts to increase step length but with early toe-off and reduced WB through LLE. PT encourages shorter steps and increased WB through LLE to normalize gait pattern   Stairs             Wheelchair Mobility    Modified Rankin (Stroke Patients Only)       Balance Overall balance assessment: Needs assistance Sitting-balance support: No upper extremity supported;Feet supported Sitting balance-Leahy Scale: Good     Standing balance support: Bilateral upper extremity supported Standing balance-Leahy Scale: Poor Standing balance comment: reliant on BUE support and close supervision                            Cognition Arousal/Alertness: Awake/alert Behavior During Therapy: WFL for tasks assessed/performed Overall Cognitive Status: Within Functional Limits for tasks assessed                                        Exercises      General Comments General comments (skin integrity, edema, etc.): VSS on RA      Pertinent Vitals/Pain Pain Assessment: Faces Faces Pain Scale: Hurts even more Pain Location: L hip Pain Descriptors / Indicators: Aching Pain Intervention(s): Premedicated before session    Home Living  Prior Function            PT Goals (current goals can now be found in the care plan section) Acute Rehab PT Goals Patient Stated Goal: get stronger, less pain Progress towards PT goals: Progressing toward goals (slowly)    Frequency    Min 5X/week      PT Plan Current plan remains appropriate    Co-evaluation              AM-PAC PT "6 Clicks" Mobility   Outcome Measure  Help needed turning from your back to your side while  in a flat bed without using bedrails?: None Help needed moving from lying on your back to sitting on the side of a flat bed without using bedrails?: None Help needed moving to and from a bed to a chair (including a wheelchair)?: A Little Help needed standing up from a chair using your arms (e.g., wheelchair or bedside chair)?: A Little Help needed to walk in hospital room?: A Little Help needed climbing 3-5 steps with a railing? : A Lot 6 Click Score: 19    End of Session   Activity Tolerance: Patient tolerated treatment well Patient left: in bed;with call bell/phone within reach;with bed alarm set Nurse Communication: Mobility status PT Visit Diagnosis: Pain;Difficulty in walking, not elsewhere classified (R26.2) Pain - Right/Left: Left Pain - part of body: Hip     Time: 1655-3748 PT Time Calculation (min) (ACUTE ONLY): 17 min  Charges:  $Gait Training: 8-22 mins                     Zenaida Niece, PT, DPT Acute Rehabilitation Pager: [phone]    Zenaida Niece 07/27/2020, 5:00 PM

## 2020-07-27 NOTE — Progress Notes (Deleted)
[facility] OFFICE PROGRESS NOTE  [name], MD [place] [address]  DIAGNOSIS: Stage IV (T2b, N3, M1C) non-small cell lung cancer, squamous cell carcinoma presented with right upper lobe lung mass in addition to mediastinal and right supraclavicular lymphadenopathy as well as multiple metastatic bone lesions diagnosed in March 2021.  PRIOR THERAPY: 1) Palliative radiotherapy to the painful metastatic bone lesions under the care of Dr. Sondra Come. Last dose 01/14/20 2) Radiotherapy to the enlarging lesion in the right hilar region and right lateral 8th rib under the care of Dr. Sondra Come. Completed on *** ? 3) Intertrochanteric intramedullary nail to the impending pathological fracture under the care of Dr. Erlinda Hong on 07/25/20.   CURRENT THERAPY: Palliative systemic chemotherapy withcarboplatin for AUC of 5, paclitaxel 175 NG/M2 and Keytruda 200 mg IV every 3 weeks with Neulasta support.First dose 01/03/20. Status post8cycles.Starting from cycle #5 he will be on maintenance single agent Keytruda ***  INTERVAL HISTORY: Mike [name] 54 y.o. male returns to the clinic today for follow-up visit.  The patient feeling_today without any concerning complaints except for _.  In August 2021, the patient had evidence of an enlarging right upper lobe mass and right hilar lymphadenopathy, and a right eighth rib metastatic lesion.  He was subsequently referred to Dr. Randa Ngo to radiation oncology for consideration of palliative radiotherapy which was performed on_.  The patient had been endorsing increasingly worse pain in his left? hip.  He had an MRI performed in early October 2021 which showed impending pathological fracture.  The patient was then seen by orthopedic surgery and admitted on 07/24/2020 for a left intertrochanteric intramedullary nail by Dr. Erlinda Hong.   Otherwise, the patient is for lying_.  Denies any fever, chills, night sweats, or weight loss.  He denies any  hemoptysis.  Chest pain, shortness of breath, cough?.  He denies any nausea, vomiting, or constipation.  He endorses chronic diarrhea.  Denies any headaches but reports vertigo which has been occurring for several years prior to his diagnosis.  The patient is here today for evaluation and consideration of starting cycle #5 of his treatment.   MEDICAL HISTORY: Past Medical History:  Diagnosis Date  . Arthritis    neck  . Cancer, metastatic to bone ([place]) dx'd 10/2019  . Diabetes ([place])   . Fatty liver 12/05/2019   By [place] 11/2019  . GERD (gastroesophageal reflux disease)   . 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 High cholesterol   . IBS (irritable bowel syndrome)   . lung ca dx'd 10/2019   lung stage 4   . OSA (obstructive sleep apnea) 09/15/2019   Sleep study 12/2017 - severe OSA with AHI 65.6, desat to 76% rec CPAP  . Severe obesity (BMI 35.0-39.9) with comorbidity ([place]) 09/04/2019    ALLERGIES:  has No Known Allergies.  MEDICATIONS:  No current facility-administered medications for this visit.   Current Outpatient Medications  Medication Sig Dispense Refill  . oxyCODONE-acetaminophen (PERCOCET) 5-325 MG tablet Take 1-2 tablets by mouth every 8 (eight) hours as needed for severe pain. 30 tablet 0   Facility-Administered Medications Ordered in Other Visits  Medication Dose Route Frequency Provider Last Rate Last Admin  . 0.9 %  sodium chloride infusion   Intravenous Continuous Leandrew Koyanagi, MD 75 mL/hr at 07/26/20 0933 New Bag at 07/26/20 0933  . acetaminophen (TYLENOL) tablet 325-650 mg  325-650 mg Oral Q6H PRN Leandrew Koyanagi, MD      .  alum & mag hydroxide-simeth (MAALOX/MYLANTA) 200-200-20 MG/5ML suspension 30 mL  30 mL Oral Q4H PRN Leandrew Koyanagi, MD      . apixaban Arne [name]) tablet 5 mg  5 mg Oral BID Leandrew Koyanagi, MD   5 mg at 07/27/20 0908  . atorvastatin (LIPITOR) tablet 20 mg  20 mg Oral Daily Leandrew Koyanagi, MD   20 mg at 07/26/20 1946  . bisacodyl (DULCOLAX) EC tablet  5 mg  5 mg Oral Daily PRN Leandrew Koyanagi, MD      . Chlorhexidine Gluconate Cloth 2 % PADS 6 each  6 each Topical Daily Leandrew Koyanagi, MD   6 each at 07/27/20 1731  . diazepam (VALIUM) tablet 5 mg  5 mg Oral Q12H PRN Leandrew Koyanagi, MD      . docusate sodium (COLACE) capsule 100 mg  100 mg Oral BID Leandrew Koyanagi, MD   100 mg at 07/27/20 0908  . HYDROmorphone (DILAUDID) injection 0.5-1 mg  0.5-1 mg Intravenous Q2H PRN Cherene Altes, MD   1 mg at 07/27/20 1149  . insulin aspart (novoLOG) injection 0-5 Units  0-5 Units Subcutaneous QHS Leandrew Koyanagi, MD   2 Units at 07/25/20 2115  . insulin aspart (novoLOG) injection 0-9 Units  0-9 Units Subcutaneous TID WC Leandrew Koyanagi, MD   2 Units at 07/27/20 1150  . magnesium citrate solution 1 Bottle  1 Bottle Oral Once PRN Leandrew Koyanagi, MD      . menthol-cetylpyridinium (CEPACOL) lozenge 3 mg  1 lozenge Oral PRN Leandrew Koyanagi, MD       Or  . phenol (CHLORASEPTIC) mouth spray 1 spray  1 spray Mouth/Throat PRN Leandrew Koyanagi, MD      . methocarbamol (ROBAXIN) tablet 500 mg  500 mg Oral Q6H PRN Leandrew Koyanagi, MD   500 mg at 07/27/20 1605   Or  . methocarbamol (ROBAXIN) 500 mg in dextrose 5 % 50 mL IVPB  500 mg Intravenous Q6H PRN Leandrew Koyanagi, MD      . morphine (MS CONTIN) 12 hr tablet 30 mg  30 mg Oral Q12H Leandrew Koyanagi, MD   30 mg at 07/27/20 1730  . nicotine (NICODERM CQ - dosed in mg/24 hours) patch 21 mg  21 mg Transdermal Daily Leandrew Koyanagi, MD   21 mg at 07/27/20 0908  . ondansetron (ZOFRAN) tablet 4 mg  4 mg Oral Q6H PRN Leandrew Koyanagi, MD       Or  . ondansetron [facility]) injection 4 mg  4 mg Intravenous Q6H PRN Leandrew Koyanagi, MD      . oxyCODONE (Oxy IR/ROXICODONE) immediate release tablet 10-15 mg  10-15 mg Oral Q3H PRN Cherene Altes, MD   15 mg at 07/27/20 1605  . oxyCODONE (Oxy IR/ROXICODONE) immediate release tablet 5-10 mg  5-10 mg Oral Q3H PRN Cherene Altes, MD      . polyethylene glycol (MIRALAX / GLYCOLAX) packet 17 g  17 g Oral  Daily PRN Leandrew Koyanagi, MD      . sodium chloride flush (NS) 0.9 % injection 10-40 mL  10-40 mL Intracatheter PRN Leandrew Koyanagi, MD      . sorbitol 70 % solution 30 mL  30 mL Oral Daily PRN Leandrew Koyanagi, MD        SURGICAL HISTORY:  Past Surgical History:  Procedure Laterality Date  . BIOPSY  12/18/2019   Procedure: BIOPSY;  Surgeon: Garner Nash, DO;  Location: [facility];  Service: Pulmonary;;  . BRONCHIAL BRUSHINGS  12/18/2019   Procedure: BRONCHIAL BRUSHINGS;  Surgeon: Garner Nash, DO;  Location: [facility];  Service: Pulmonary;;  . BRONCHIAL WASHINGS  12/18/2019   Procedure: BRONCHIAL WASHINGS;  Surgeon: Garner Nash, DO;  Location: [facility];  Service: Pulmonary;;  . COLONOSCOPY  10/2019   mult TA, int hem, rpt 1 yr ([place])  . CYSTECTOMY     knee- left knee cyst  . ENDOBRONCHIAL ULTRASOUND  12/18/2019   Procedure: ENDOBRONCHIAL ULTRASOUND;  Surgeon: Garner Nash, DO;  Location: [facility];  Service: Pulmonary;;  . FINE NEEDLE ASPIRATION  12/18/2019   Procedure: FINE NEEDLE ASPIRATION;  Surgeon: Garner Nash, DO;  Location: [facility];  Service: Pulmonary;;  . IR IMAGING GUIDED PORT INSERTION  12/27/2019  . PILONIDAL CYST EXCISION    . VIDEO BRONCHOSCOPY WITH ENDOBRONCHIAL ULTRASOUND Right 12/18/2019   Procedure: VIDEO BRONCHOSCOPY;  Surgeon: Garner Nash, DO;  Location: [facility];  Service: Pulmonary;  Laterality: Right;    REVIEW OF SYSTEMS:   Review of Systems  Constitutional: Negative for appetite change, chills, fatigue, fever and unexpected weight change.  HENT:   Negative for mouth sores, nosebleeds, sore throat and trouble swallowing.   Eyes: Negative for eye problems and icterus.  Respiratory: Negative for cough, hemoptysis, shortness of breath and wheezing.   Cardiovascular: Negative for chest pain and leg swelling.  Gastrointestinal: Negative for abdominal pain, constipation, diarrhea, nausea and vomiting.  Genitourinary: Negative  for bladder incontinence, difficulty urinating, dysuria, frequency and hematuria.   Musculoskeletal: Negative for back pain, gait problem, neck pain and neck stiffness.  Skin: Negative for itching and rash.  Neurological: Negative for dizziness, extremity weakness, gait problem, headaches, light-headedness and seizures.  Hematological: Negative for adenopathy. Does not bruise/bleed easily.  Psychiatric/Behavioral: Negative for confusion, depression and sleep disturbance. The patient is not nervous/anxious.     PHYSICAL EXAMINATION:  There were no vitals taken for this visit.  ECOG PERFORMANCE STATUS: {CHL ONC ECOG [id number]  Physical Exam  Constitutional: Oriented to person, place, and time and well-developed, well-nourished, and in no distress. No distress.  HENT:  Head: Normocephalic and atraumatic.  Mouth/Throat: Oropharynx is clear and moist. No oropharyngeal exudate.  Eyes: Conjunctivae are normal. Right eye exhibits no discharge. Left eye exhibits no discharge. No scleral icterus.  Neck: Normal range of motion. Neck supple.  Cardiovascular: Normal rate, regular rhythm, normal heart sounds and intact distal pulses.   Pulmonary/Chest: Effort normal and breath sounds normal. No respiratory distress. No wheezes. No rales.  Abdominal: Soft. Bowel sounds are normal. Exhibits no distension and no mass. There is no tenderness.  Musculoskeletal: Normal range of motion. Exhibits no edema.  Lymphadenopathy:    No cervical adenopathy.  Neurological: Alert and oriented to person, place, and time. Exhibits normal muscle tone. Gait normal. Coordination normal.  Skin: Skin is warm and dry. No rash noted. Not diaphoretic. No erythema. No pallor.  Psychiatric: Mood, memory and judgment normal.  Vitals reviewed.  LABORATORY DATA: Lab Results  Component Value Date   WBC 8.9 07/27/2020   HGB 12.3 (L) 07/27/2020   HCT 37.5 (L) 07/27/2020   MCV 92.6 07/27/2020   PLT 248 07/27/2020       Chemistry      Component Value Date/Time   NA 136 07/27/2020 0401   K 4.3 07/27/2020 0401   CL 99 07/27/2020 0401   CO2 28 07/27/2020 0401  BUN 9 07/27/2020 0401   CREATININE 0.76 07/27/2020 0401   CREATININE 0.76 07/08/2020 0808      Component Value Date/Time   CALCIUM 8.7 (L) 07/27/2020 0401   ALKPHOS 61 07/25/2020 0336   AST 19 07/25/2020 0336   AST 23 07/08/2020 0808   ALT 24 07/25/2020 0336   ALT 23 07/08/2020 0808   BILITOT 0.4 07/25/2020 0336   BILITOT 0.3 07/08/2020 0808       RADIOGRAPHIC STUDIES:  MR Pelvis W Wo Contrast  Result Date: 07/20/2020 CLINICAL DATA:  Posterior left hip pain in a patient with a history of metastatic lung cancer. EXAM: MRI PELVIS WITHOUT AND WITH CONTRAST TECHNIQUE: Multiplanar multisequence MR imaging of the pelvis was performed both before and after administration of intravenous contrast. CONTRAST:  10 mL GADAVIST IV SOLN COMPARISON:  CT chest, abdomen and pelvis 06/06/2020. FINDINGS: Bones/Joint/Cartilage A metastatic deposit in the base of the left femoral neck and left intertrochanteric femur measures approximately 4.5 cm AP by 5 cm transverse by 6 cm craniocaudal. The deposit fills the base of the femoral neck. There appears to be a small focus of cortical destruction along the anterior, superior margin of the femoral neck. A second metastatic deposit in the right side of the S1 vertebral body is approximately 3.1 cm transverse by 2 cm craniocaudal by 2 cm AP. There is also a metastatic lesion in the posterior left ilium measuring 4 cm AP by 2 cm transverse by 3.8 cm craniocaudal. Previously seen metastatic lesion in the posterior right acetabulum is now cystic with a corticated margin consistent with prior treatment. There is enhancing soft tissue about the periphery of the lesion worrisome for residual tumor. Finally, a punctate metastatic deposit is seen in the left acetabulum image 22 of series 12. Ligaments Intact. Muscles and Tendons  Intact. Edema in the left hip adductors is likely due to strain from altered mechanics. No intramuscular mass or fluid collection. Soft tissues No acute or focal abnormality. IMPRESSION: Osseous metastatic disease as described above. Largest deposit is in the base of the left femoral neck and intertrochanteric femur. There appears to be a small cortical break in the superior margin of the femoral neck and the patient may be at risk for pathologic fracture. These results will be called to the ordering clinician or representative by the Radiologist Assistant, and communication documented in the PACS or [employer]. Electronically Signed   By: Inge Rise M.D.   On: 07/20/2020 12:18   DG C-Arm 1-60 Min  Result Date: 07/25/2020 CLINICAL DATA:  Known proximal left femoral metastatic disease EXAM: OPERATIVE LEFT HIP WITH PELVIS; DG C-ARM 1-60 MIN COMPARISON:  MRI from 07/19/2020 FLUOROSCOPY TIME:  Fluoroscopy Time:  1 minutes 20 seconds Radiation Exposure Index (if provided by the fluoroscopic device): 30.15 mGy Number of Acquired Spot Images: 4 FINDINGS: Medullary rod is seen in the left femur with proximal and distal fixation screws. The known proximal femoral metastatic lesion is not well appreciated on the spot films. IMPRESSION: Fixation of proximal left femoral metastatic disease. Electronically Signed   By: Inez Catalina M.D.   On: 07/25/2020 18:47   DG HIP OPERATIVE UNILAT W OR W/O PELVIS LEFT  Result Date: 07/25/2020 CLINICAL DATA:  Known proximal left femoral metastatic disease EXAM: OPERATIVE LEFT HIP WITH PELVIS; DG C-ARM 1-60 MIN COMPARISON:  MRI from 07/19/2020 FLUOROSCOPY TIME:  Fluoroscopy Time:  1 minutes 20 seconds Radiation Exposure Index (if provided by the fluoroscopic device): 30.15 mGy Number of Acquired  Spot Images: 4 FINDINGS: Medullary rod is seen in the left femur with proximal and distal fixation screws. The known proximal femoral metastatic lesion is not well appreciated on  the spot films. IMPRESSION: Fixation of proximal left femoral metastatic disease. Electronically Signed   By: Inez Catalina M.D.   On: 07/25/2020 18:47     ASSESSMENT/PLAN:  This is a very pleasant 46 year oldCaucasianmale recently diagnosed with a stage IV (T2b, N3, M1C) non-small cell lung cancer, squamous cell carcinoma presented with right upper lobe lung mass in addition to mediastinal and right supraclavicular lymphadenopathy as well as multiple metastatic bone lesions diagnosed in March 2021.  The patientcompletedpalliative radiotherapy to the painful bone lesions under the care of Dr.Kinard.Last treatment on 01/14/20.  The patient is currently undergoing palliative systemic chemotherapy with carboplatin for an AUC of 5, paclitaxel 175mg /m, and Keytruda 200 mg IV every 3 weeks with Neulasta support. He is status post8cycles.Starting from cycle #5 he has been on maintenance single agent Keytruda.  The patient's last scan showed enlarging right left upper lobe lesion, right hilar lymph adenopathy, and enlarging right eighth rib lesion.  He subsequently received palliative radiotherapy under the care of Dr. Sondra Come last treatment on_.  The patient then had a left intertrochanteric intramedullary nail placed under the care under Dr. Erlinda Hong on 07/25/20  The patient was seen with Dr. Julien Nordmann today.  Labs reviewed.  Recommend that he proceed???  Cycle #9 today scheduled  I will arrange for restaging CT scan of the chest, abdomen, and pelvis prior to starting his next cycle of treatment if he has evidence of further disease progression, we may have to change his systemic chemotherapy with docetaxel and Cyramza.??? Can't start cyrama for few weeks from surg ***  The patient was advised to call immediately if she has any concerning symptoms in the interval. The patient voices understanding of current disease status and treatment options and is in agreement with the current care plan. All  questions were answered. The patient knows to call the clinic with any problems, questions or concerns. We can certainly see the patient much sooner if necessary     No orders of the defined types were placed in this encounter.    Leelynd Maldonado L Latif Nazareno, PA-C 07/27/20

## 2020-07-28 ENCOUNTER — Encounter (HOSPITAL_COMMUNITY): Payer: Self-pay | Admitting: Orthopaedic Surgery

## 2020-07-28 LAB — GLUCOSE, CAPILLARY
Glucose-Capillary: 153 mg/dL — ABNORMAL HIGH (ref 70–99)
Glucose-Capillary: 169 mg/dL — ABNORMAL HIGH (ref 70–99)

## 2020-07-28 LAB — SURGICAL PATHOLOGY

## 2020-07-28 MED ORDER — ACETAMINOPHEN 325 MG PO TABS: 325.0000 mg | ORAL_TABLET | Freq: Four times a day (QID) | ORAL | Status: DC | PRN

## 2020-07-28 MED ORDER — DOCUSATE SODIUM 100 MG PO CAPS: 100.0000 mg | ORAL_CAPSULE | Freq: Two times a day (BID) | ORAL | 0 refills | Status: AC

## 2020-07-28 MED ORDER — OXYCODONE HCL 10 MG PO TABS: 10.0000 mg | ORAL_TABLET | ORAL | 0 refills | Status: DC | PRN

## 2020-07-28 MED ORDER — METHOCARBAMOL 500 MG PO TABS: 500.0000 mg | ORAL_TABLET | Freq: Four times a day (QID) | ORAL | 0 refills | Status: DC | PRN

## 2020-07-28 MED ORDER — HEPARIN SOD (PORK) LOCK FLUSH 100 UNIT/ML IV SOLN
500.0000 [IU] | INTRAVENOUS | Status: AC | PRN
  Administered 2020-07-28: 500 [IU]
  Filled 2020-07-28: qty 5

## 2020-07-28 NOTE — Consult Note (Signed)
   [name] Inpatient Consult   07/28/2020  Mike [name] 07/19/1966 [id number]   Mike [name] [ACO] Patient:  Mike [name] Referral:  Support  Patient evaluated for community based chronic disease management services with [name] as a benefit of patient's Primary [facility] and with Primary Care Provider Dr. Ria [name]. [name] Spoke with patient at bedside phone to explain [facility] services. Patient verbally agrees to follow up.  Patient will receive post [name] discharge call and for assessments fo community needs for care/disease management.    Plan: Made  Inpatient Transition of Care [TOC] Case Manager aware that patient accepted [name] Care Management following.   Of note, [name] Care Management services does not replace or interfere with any services that are arranged by inpatient Transition of Care [TOC] team     For additional questions or referrals please contact:    Mike Brood, RN BSN [name] Liaison  [phone] business mobile phone Toll free office [phone]  Fax number: [phone] [name]  [email or web address]

## 2020-07-28 NOTE — TOC Transition Note (Addendum)
Transition of Care (TOC) - CM/SW Discharge Note   Patient Details  Name: Mike [name] MRN: [id number] Date of Birth: 10/22/1965  Transition of Care (TOC) CM/SW Contact:  Cole, Angela Hudson, RN Phone Number: [phone] 07/28/2020, 3:29 PM   Clinical Narrative:    Admitted with L femur fx. Hx of stage IV lung cancer with metastasis to bone, post palliative systemic chemotherapy and radiotherapy, saddle PE. From home with fiance' and brother. Met with pt to discuss d/cplans. Shared PT evaluation , recommendations for HHPT. Pt agreeable to home health services. Choice provided. Pt without preference. Referral made with [facility] and accepted. DME; rolling walker and w/c noted ... referral made with [facility]. DME will be delivered to bedside prior to d/c.  Referral made with [facility] for community support.  Per MD patient ready for DC today. RN, patient, and patient's family notified of DC. Pt without Rx med concerns or affordability. HH services to begin within 24-48 hrs post d/c. Post hospital f/u appointments noted on AVS.  Kelly Nolton (Brother) Lisa Robertson (Sgo)    [phone]     Brother to provide transportation to home.  RNCM will sign off for now as intervention is no longer needed. Please consult us again if new needs arise.   Final next level of care: Home w Home Health Services Barriers to Discharge: No Barriers Identified   Patient Goals and CMS Choice     Choice offered to / list presented to : Patient  Discharge Placement                       Discharge Plan and Services                                     Social Determinants of Health (SDOH) Interventions     Readmission Risk Interventions No flowsheet data found.     

## 2020-07-28 NOTE — Evaluation (Signed)
Occupational Therapy Evaluation Patient Details Name: Mike [name] MRN: [id number] DOB: [birth year] Today's Date: 07/28/2020    History of Present Illness Pt is a 54 y.o. 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 Pt now s/p prophylactic stabilization of impending pathologic fracture of left intertrochanteric femur.    Clinical Impression   PTA, pt lives with fiance and brother who can provide 24/7 supervision/assist as needed. Pt reports use of cane for mobility and typically able to complete ADLs/IADLs, but may require occasional assist to don pants around feet due to injury. Pt presents with minor deficits in pain, endurance and dynamic standing balance. Pt overall min guard at most for mobility in room using RW. Pt requires up to [place] A for LB ADLs due to deficits, educated on use of AE that pt has access to at home for ADL independence. Also educated on energy conservation strategies due to pt pain and SOB with activity. Anticipate no skilled OT services needed on DC, but would benefit from further skilled OT services at acute level to ensure carryover of AE for LB ADLs and increase endurance.     Follow Up Recommendations  No OT follow up;Supervision - Intermittent    Equipment Recommendations  Other (comment) (RW)    Recommendations for Other Services       Precautions / Restrictions Precautions Precautions: Fall Restrictions Weight Bearing Restrictions: Yes LLE Weight Bearing: Weight bearing as tolerated      Mobility Bed Mobility Overal bed mobility: Needs Assistance Bed Mobility: Sit to Supine     Supine to sit: Supervision;HOB elevated Sit to supine: Supervision;HOB elevated   General bed mobility comments: no assist needed, able to scoot self up in bed using bed  rails  Transfers Overall transfer level: Needs assistance Equipment used: Rolling walker (2 wheeled) Transfers: Sit to/from [facility] Sit to Stand: Min guard Stand pivot transfers: Min guard       General transfer comment: min guard initially for transitional movements and dynamic tasks, progressing to supervision at times     Balance Overall balance assessment: Needs assistance Sitting-balance support: No upper extremity supported;Feet supported Sitting balance-Leahy Scale: Good     Standing balance support: Bilateral upper extremity supported Standing balance-Leahy Scale: Fair Standing balance comment: reliant on UE support for dynamic tasks. Able to reach with one UE to move phone from bed in standing                            ADL either performed or assessed with clinical judgement   ADL Overall ADL's : Needs assistance/impaired Eating/Feeding: Independent;Sitting   Grooming: Supervision/safety;Standing;Oral care Grooming Details (indicate cue type and reason): Supervision standing at sink for oral care. Pt unsure if he could stand for task, had BSC close by to sit if needed. Educated to sit at sink for grooming beard, etc as needed Upper Body Bathing: Sitting;Set up   Lower Body Bathing: Min guard;Sit to/from stand   Upper Body Dressing : Set up;Sitting   Lower Body Dressing: Minimal assistance;Sit to/from stand Lower Body Dressing Details (indicate cue type and reason): Difficulty reaching L foot, unable to cross legs. Pt reports occasional use of sock aid at home Toilet Transfer: Min guard;Ambulation;RW Toilet Transfer Details (indicate cue type and reason): simulated  in room, to/from bathroom with RW Toileting- Clothing Manipulation and Hygiene: Supervision/safety;Sit to/from stand       Functional mobility during ADLs: Min guard;Rolling walker General ADL Comments: Pain is main limitation to full independence at this time, motivated  to get better and return to independence. Based on Stage IV lung cancer (no O2 needs during the day), provided energy conservation handout to assist with ADL/IADL completion secondary to pain and SOB during activity (2/4 DOE)     Vision Baseline Vision/History: No visual deficits Patient Visual Report: No change from baseline Vision Assessment?: No apparent visual deficits     Perception     Praxis      Pertinent Vitals/Pain Pain Assessment: 0-10 Pain Score: 8  Pain Location: L hip Pain Descriptors / Indicators: Discomfort Pain Intervention(s): Limited activity within patient's tolerance;Monitored during session;Repositioned     Hand Dominance Right   Extremity/Trunk Assessment Upper Extremity Assessment Upper Extremity Assessment: Overall WFL for tasks assessed   Lower Extremity Assessment Lower Extremity Assessment: Defer to PT evaluation LLE Deficits / Details: s/p hip IMN. Grossly 2/5, atrophy noted   Cervical / Trunk Assessment Cervical / Trunk Assessment: Normal   Communication Communication Communication: No difficulties   Cognition Arousal/Alertness: Awake/alert Behavior During Therapy: WFL for tasks assessed/performed Overall Cognitive Status: Within Functional Limits for tasks assessed                                 General Comments: very pleasant and cooperative   General Comments  SOB during activity, pt reports as mouth breather requiring cues to slow and control breathing.     Exercises General Exercises - Lower Extremity Ankle Circles/Pumps: AROM;Both;10 reps;Supine Quad Sets: AROM;Strengthening;Left;10 reps;Supine Long Arc Quad: AROM;Strengthening;Left;10 reps;Seated Heel Slides: AROM;Strengthening;Left;10 reps;Supine   Shoulder Instructions      Home Living Family/patient expects to be discharged to:: Private residence Living Arrangements: Spouse/significant other;Other relatives (brother) Available Help at Discharge:  Family;Available 24 hours/day Type of Home: Mobile home Home Access: Stairs to enter Entrance Stairs-Number of Steps: 4 Entrance Stairs-Rails: Right;Left Home Layout: One level     Bathroom Shower/Tub: Walk-in shower         Home Equipment: Cane - quad;Bedside commode;Adaptive equipment Adaptive Equipment: Reacher;Sock aid        Prior Functioning/Environment Level of Independence: Needs assistance  Gait / Transfers Assistance Needed: using cane ADL's / Homemaking Assistance Needed: Pt reports typically Modified Independent but since injury, occasionally needs assist with donning pants over feet            OT Problem List: Decreased strength;Decreased activity tolerance;Impaired balance (sitting and/or standing);Pain      OT Treatment/Interventions: Self-care/ADL training;Therapeutic exercise;Energy conservation;DME and/or AE instruction;Therapeutic activities;Patient/family education;Balance training    OT Goals(Current goals can be found in the care plan section) Acute Rehab OT Goals Patient Stated Goal: get stronger, less pain OT Goal Formulation: With patient Time For Goal Achievement: 08/11/20 Potential to Achieve Goals: Good ADL Goals Pt Will Perform Lower Body Dressing: with modified independence;sitting/lateral leans;sit to/from stand;with adaptive equipment Pt Will Transfer to Toilet: with modified independence;ambulating;bedside commode Additional ADL Goal #1: Pt to demonstrate ability to complete ADL/IADL tasks in standing >5 minutes in order to increase endurance  OT Frequency: Min 2X/week   Barriers to D/C:            Co-evaluation              AM-PAC OT "  6 Clicks" Daily Activity     Outcome Measure Help from another person eating meals?: None Help from another person taking care of personal grooming?: A Little Help from another person toileting, which includes using toliet, bedpan, or urinal?: A Little Help from another person bathing  (including washing, rinsing, drying)?: A Little Help from another person to put on and taking off regular upper body clothing?: A Little Help from another person to put on and taking off regular lower body clothing?: A Little 6 Click Score: 19   End of Session Equipment Utilized During Treatment: Rolling walker  Activity Tolerance: Patient tolerated treatment well Patient left: in bed;with call bell/phone within reach;with bed alarm set;with nursing/sitter in room  OT Visit Diagnosis: Unsteadiness on feet (R26.81);Other abnormalities of gait and mobility (R26.89);Muscle weakness (generalized) (M62.81);Pain Pain - Right/Left: Left Pain - part of body: Hip;Leg                Time: 9694-0982 OT Time Calculation (min): 25 min Charges:  OT General Charges $OT Visit: 1 Visit OT Evaluation $OT Eval Moderate Complexity: 1 Mod OT Treatments $Self Care/Home Management : 8-22 mins  Layla Maw, OTR/L  Layla Maw 07/28/2020, 10:03 AM

## 2020-07-28 NOTE — Discharge Summary (Signed)
DISCHARGE SUMMARY  Mike [name]  MR#: [id number]  DOB:[birth year]  Date of Admission: 07/24/2020 Date of Discharge: 07/28/2020  Attending Physician:Mike Pelley Hennie Duos, MD  Patient's NWG:[id number], Mike Hatchet, MD  Consults: Orthopedics - Dr. Erlinda [name]  Disposition: d/c home   Follow-up Appts:  Follow-up Information    Mike Koyanagi, MD In 2 weeks.   Specialty: Orthopedic Surgery Why: For suture removal, For wound re-check Contact information: [place] [address] [phone]        Care, [facility] Follow up.   Specialty: Why Why: home health services arranged Contact information: [place] [address] [phone]        Mike Bush, MD Follow up in 1 week(s).   Specialty: Family Medicine Contact information: [place] [address] [phone]        Mike Sable, MD .   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  Initial presentation: 54yo with a history of stage IV lung cancer and known mets to bone status post palliative chemotherapy and XRT, DM 2, OSA not on CPAP, saddle PE on chronic Eliquis, and obesity who was sent to the hospital by his Orthopedic Surgeon for correction of a known left hip pathologic fracture.  Patient reported pain in this hip for approximately 2 months which had gradually worsened.  There was no history of fall/trauma.  MRI 10/2 noted multiple bony metastatic deposits including a left femoral neck and intertrochanteric region  deposit.  Hospital Course: 10/7 direct admit to [facility] 10/8 prophylactic stabilization of impending pathologic fracture left intertrochanteric femur in OR 10/11 d/c home   Pathologic left femoral fracture Status post surgical correction - heparin bridge utilized pre-op - Eliquis resumed 42/5 - no complications post-op - pain meds increased for acute post-op pain - discussed d/c plan w/ Dr. 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 Earlie [name]  Chronic cancer related pain Chronically on MS Contin and OxyIR -continue bowel regimen - adjusted pain meds to account for acute post-op pain - resume usual home regimen as post-op pain improves   DM 2 A1c 7.8 -managed with Metformin alone as outpatient -CBG reasonably controlled th/o hospital stay   Tobacco abuse 86+ pack year history  Obstructive sleep apnea Continue usual home CPAP regimen nightly  Obesity - Body mass index is 34.87 kg/m.  Allergies as of 07/28/2020   No Known Allergies     Medication List    TAKE these medications   acetaminophen 325 MG tablet Commonly known as: TYLENOL Take 1-2 tablets (325-650 mg total) by mouth every 6 (six) hours as needed for mild pain (pain score 1-3 or temp > 100.5).   albuterol (2.5 MG/3ML) 0.083% nebulizer solution Commonly known as: PROVENTIL Take 3 mLs (2.5  mg total) by nebulization every 6 (six) hours as needed for wheezing or shortness of breath.   apixaban 5 MG Tabs tablet Commonly known as: ELIQUIS Take 1 tablet (5 mg total) by mouth 2 (two) times daily.   atorvastatin 20 MG tablet Commonly known as: LIPITOR Take 1 tablet (20 mg total) by mouth at  bedtime.   diazepam 5 MG tablet Commonly known as: VALIUM Take 1 tablet (5 mg total) by mouth every 12 (twelve) hours as needed for anxiety or muscle spasms.   docusate sodium 100 MG capsule Commonly known as: COLACE Take 1 capsule (100 mg total) by mouth 2 (two) times daily.   metFORMIN 1000 MG tablet Commonly known as: GLUCOPHAGE Take 1 tablet (1,000 mg total) by mouth 2 (two) times daily with a meal.   methocarbamol 500 MG tablet Commonly known as: ROBAXIN Take 1 tablet (500 mg total) by mouth every 6 (six) hours as needed for muscle spasms.   morphine 30 MG 12 hr tablet Commonly known as: MS CONTIN Take 1 tablet (30 mg total) by mouth every 12 (twelve) hours.   Oxycodone HCl 10 MG Tabs Take 1-1.5 tablets (10-15 mg total) by mouth every 3 (three) hours as needed for severe pain (pain score 7-10). What changed:   medication strength  how much to take  when to take this  reasons to take this   sildenafil 20 MG tablet Commonly known as: REVATIO Take 1-5 tablets (20-100 mg total) by mouth daily as needed (ED). What changed: how much to take            Durable Medical Equipment  (From admission, onward)         Start     Ordered   07/28/20 1408  For home use only DME Walker rolling  Once       Question Answer Comment  Walker: With [place] Wheels   Patient needs a walker to treat with the following condition Hip fracture ([place])      07/28/20 1408   07/28/20 1226  For home use only DME lightweight manual wheelchair with seat cushion  Once       Comments: Patient suffers from femoral fx which impairs their ability to perform daily activities like walking in the home.  A rolling walker will not resolve  issue with performing activities of daily living. A wheelchair will allow patient to safely perform daily activities. Patient is not able to propel themselves in the home using a standard weight wheelchair due to weakness. Patient can self propel in the lightweight  wheelchair. Length of need 12 months. Accessories: elevating leg rests (ELRs), wheel locks, extensions and anti-tippers.   07/28/20 1228           Discharge Care Instructions  (From admission, onward)         Start     Ordered   07/25/20 0000  Weight bearing as tolerated        07/25/20 1230          Day of Discharge BP 114/84 (BP Location: Right Arm)   Pulse (!) 108   Temp 98.4 F (36.9 C) (Oral)   Resp 17   Ht 5\' 11"  (1.803 m)   Wt 113.4 kg   SpO2 94%   BMI 34.87 kg/m   Physical Exam: General: No acute respiratory distress Lungs: Clear to auscultation bilaterally without wheezes or crackles Cardiovascular: Regular rate and rhythm without murmur gallop or rub normal S1 and S2 Abdomen:  Nontender, nondistended, soft, bowel sounds positive, no rebound, no ascites, no appreciable mass Extremities: No significant edema bilateral lower extremities  Basic Metabolic Panel: Recent Labs  Lab 07/24/20 1055 07/25/20 0336 07/26/20 0408 07/27/20 0401  NA 139 136 137 136  K 4.6 3.8 4.5 4.3  CL 100 100 101 99  CO2 30 29 28 28   GLUCOSE 109* 162* 151* 164*  BUN 7 7 8 9   CREATININE 0.71 0.76 0.68 0.76  CALCIUM 9.3 8.8* 9.0 8.7*    Liver Function Tests: Recent Labs  Lab 07/24/20 1055 07/25/20 0336  AST 26 19  ALT 28 24  ALKPHOS 66 61  BILITOT 0.5 0.4  PROT 6.5 5.7*  ALBUMIN 2.8* 2.4*    Coags: Recent Labs  Lab 07/24/20 1055  INR 1.1    CBC: Recent Labs  Lab 07/24/20 1055 07/25/20 0336 07/26/20 0408 07/27/20 0401  WBC 6.8 6.2 9.1 8.9  HGB 11.8* 11.0* 11.6* 12.3*  HCT 36.7* 34.7* 36.4* 37.5*  MCV 94.1 94.0 91.7 92.6  PLT 289 264 254 248    BNP (last 3 results) Recent Labs    01/22/20 1027  BNP 108.1*     CBG: Recent Labs  Lab 07/27/20 1118 07/27/20 1626 07/27/20 2054 07/28/20 0701 07/28/20 1128  GLUCAP 192* 84 141* 153* 169*    Recent Results (from the past 240 hour(s))  Respiratory Panel by RT PCR (Flu A&B, Covid) -  Nasopharyngeal Swab     Status: None   Collection Time: 07/24/20 11:46 AM   Specimen: Nasopharyngeal Swab  Result Value Ref Range Status   SARS Coronavirus 2 by RT PCR NEGATIVE NEGATIVE Final    Comment: (NOTE) SARS-CoV-2 target nucleic acids are NOT DETECTED.  The SARS-CoV-2 RNA is generally detectable in upper respiratoy specimens during the acute phase of infection. The lowest concentration of SARS-CoV-2 viral copies this assay can detect is 131 copies/mL. A negative result does not preclude SARS-Cov-2 infection and should not be used as the sole basis for treatment or other patient management decisions. A negative result may occur with  improper specimen collection/handling, submission of specimen other than nasopharyngeal swab, presence of viral mutation(s) within the areas targeted by this assay, and inadequate number of viral copies (<131 copies/mL). A negative result must be combined with clinical observations, patient history, and epidemiological information. The expected result is Negative.  Fact Sheet for Patients:  PinkCheek.be  Fact Sheet for Healthcare Providers:  GravelBags.it  This test is no t yet approved or cleared by the [place] FDA and  has been authorized for detection and/or diagnosis of SARS-CoV-2 by FDA under an Emergency Use Authorization (EUA). This EUA will remain  in effect (meaning this test can be used) for the duration of the COVID-19 declaration under Section 564(b)(1) of the Act, 21 U.S.C. section 360bbb-3(b)(1), unless the authorization is terminated or revoked sooner.     Influenza A by PCR NEGATIVE NEGATIVE Final   Influenza B by PCR NEGATIVE NEGATIVE Final    Comment: (NOTE) The Xpert Xpress SARS-CoV-2/FLU/RSV assay is intended as an aid in  the diagnosis of influenza from Nasopharyngeal swab specimens and  should not be used as a sole basis for treatment. Nasal washings and   aspirates are unacceptable for Xpert Xpress SARS-CoV-2/FLU/RSV  testing.  Fact Sheet for Patients: PinkCheek.be  Fact Sheet for Healthcare Providers: GravelBags.it  This test is not yet approved or cleared by the [place] FDA and  has been authorized for detection and/or diagnosis of SARS-CoV-2  by  FDA under an Emergency Use Authorization (EUA). This EUA will remain  in effect (meaning this test can be used) for the duration of the  Covid-19 declaration under Section 564(b)(1) of the Act, 21  U.S.C. section 360bbb-3(b)(1), unless the authorization is  terminated or revoked. Performed at [facility], [place] [address] [place], [place] [address]   Surgical pcr screen     Status: None   Collection Time: 07/24/20 11:46 AM   Specimen: Nasal Mucosa; Nasal Swab  Result Value Ref Range Status   MRSA, PCR NEGATIVE NEGATIVE Final   Staphylococcus aureus NEGATIVE NEGATIVE Final    Comment: (NOTE) The Xpert SA Assay (FDA approved for NASAL specimens in patients 25 years of age and older), is one component of a comprehensive surveillance program. It is not intended to diagnose infection nor to guide or monitor treatment. Performed at [facility], [place] [address], [place], [place] [address]       Time spent in discharge (includes decision making & examination of pt): 30 minutes  07/28/2020, 2:41 PM   Cherene Altes, MD [facility] Office  [phone]

## 2020-07-28 NOTE — Discharge Instructions (Signed)
    1. Change dressings as needed 2. May shower but keep incisions covered and dry 3. Take eliquis to prevent blood clots 4. Take stool softeners as needed 5. Take pain meds as needed

## 2020-07-28 NOTE — Plan of Care (Signed)

## 2020-07-28 NOTE — Plan of Care (Signed)
  Problem: Education: Goal: Knowledge of General Education information will improve Description: Including pain rating scale, medication(s)/side effects and non-pharmacologic comfort measures 07/28/2020 1437 by Phebe Colla, RN Outcome: Adequate for Discharge 07/28/2020 1437 by Phebe Colla, RN Outcome: Adequate for Discharge   Problem: Health Behavior/Discharge Planning: Goal: Ability to manage health-related needs will improve 07/28/2020 1437 by Phebe Colla, RN Outcome: Adequate for Discharge 07/28/2020 1437 by Phebe Colla, RN Outcome: Adequate for Discharge   Problem: Clinical Measurements: Goal: Ability to maintain clinical measurements within normal limits will improve 07/28/2020 1437 by Phebe Colla, RN Outcome: Adequate for Discharge 07/28/2020 1437 by Phebe Colla, RN Outcome: Adequate for Discharge Goal: Will remain free from infection 07/28/2020 1437 by Phebe Colla, RN Outcome: Adequate for Discharge 07/28/2020 1437 by Phebe Colla, RN Outcome: Adequate for Discharge Goal: Diagnostic test results will improve 07/28/2020 1437 by Phebe Colla, RN Outcome: Adequate for Discharge 07/28/2020 1437 by Phebe Colla, RN Outcome: Adequate for Discharge Goal: Respiratory complications will improve 07/28/2020 1437 by Phebe Colla, RN Outcome: Adequate for Discharge 07/28/2020 1437 by Phebe Colla, RN Outcome: Adequate for Discharge Goal: Cardiovascular complication will be avoided 07/28/2020 1437 by Phebe Colla, RN Outcome: Adequate for Discharge 07/28/2020 1437 by Phebe Colla, RN Outcome: Adequate for Discharge   Problem: Activity: Goal: Risk for activity intolerance will decrease 07/28/2020 1437 by Phebe Colla, RN Outcome: Adequate for Discharge 07/28/2020 1437 by Phebe Colla, RN Outcome: Adequate for Discharge   Problem: Nutrition: Goal: Adequate  nutrition will be maintained 07/28/2020 1437 by Phebe Colla, RN Outcome: Adequate for Discharge 07/28/2020 1437 by Phebe Colla, RN Outcome: Adequate for Discharge   Problem: Coping: Goal: Level of anxiety will decrease 07/28/2020 1437 by Phebe Colla, RN Outcome: Adequate for Discharge 07/28/2020 1437 by Phebe Colla, RN Outcome: Adequate for Discharge   Problem: Elimination: Goal: Will not experience complications related to bowel motility 07/28/2020 1437 by Phebe Colla, RN Outcome: Adequate for Discharge 07/28/2020 1437 by Phebe Colla, RN Outcome: Adequate for Discharge Goal: Will not experience complications related to urinary retention 07/28/2020 1437 by Phebe Colla, RN Outcome: Adequate for Discharge 07/28/2020 1437 by Phebe Colla, RN Outcome: Adequate for Discharge   Problem: Pain Managment: Goal: General experience of comfort will improve 07/28/2020 1437 by Phebe Colla, RN Outcome: Adequate for Discharge 07/28/2020 1437 by Phebe Colla, RN Outcome: Adequate for Discharge   Problem: Safety: Goal: Ability to remain free from injury will improve 07/28/2020 1437 by Phebe Colla, RN Outcome: Adequate for Discharge 07/28/2020 1437 by Phebe Colla, RN Outcome: Adequate for Discharge   Problem: Skin Integrity: Goal: Risk for impaired skin integrity will decrease 07/28/2020 1437 by Phebe Colla, RN Outcome: Adequate for Discharge 07/28/2020 1437 by Phebe Colla, RN Outcome: Adequate for Discharge

## 2020-07-28 NOTE — Progress Notes (Signed)
Physical Therapy Treatment Patient Details Name: Mike [name] MRN: [id number] DOB: 07/09/1966 Today's Date: 07/28/2020    History of Present Illness Pt is a 54 y.o. 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 Pt now s/p prophylactic stabilization of impending pathologic fracture of left intertrochanteric femur.     PT Comments    Pt reported he anticipates to d/c home today. Pt is making steady progress with mobility. Pt will have 24 hour assistance at home. Pt is ambulating short distances with min guard assistance with RW. Pt reports pain 6/10 in left LE and has been instructed in LE exercises. Pt requires increased time and verbal cues for technique and safety. Pt would benefit from HHPT to reinforce techniques with mobility and activity level. Pt is cleared for d/c home when medically stable.   Follow Up Recommendations  Home health PT;Supervision for mobility/OOB     Equipment Recommendations  Rolling walker with 5" wheels    Recommendations for Other Services       Precautions / Restrictions Precautions Precautions: Fall Restrictions LLE Weight Bearing: Weight bearing as tolerated    Mobility  Bed Mobility Overal bed mobility: Needs Assistance Bed Mobility: Supine to Sit     Supine to sit: Supervision;HOB elevated     General bed mobility comments: pt utilizing rails to reposition body and pull into sitting  Transfers Overall transfer level: Needs assistance Equipment used: Rolling walker (2 wheeled) Transfers: Sit to/from Stand Sit to Stand: Min guard;From elevated surface         General transfer comment: Verbal cues for technique  Ambulation/Gait Ambulation/Gait assistance: Min guard Gait Distance (Feet): 15 Feet Assistive device: Rolling walker (2  wheeled) Gait Pattern/deviations: Step-to pattern;Decreased stance time - left;Decreased stride length;Decreased step length - right Gait velocity: decreased       Stairs Stairs:  (has a ramp)           Wheelchair Mobility    Modified Rankin (Stroke Patients Only)       Balance Overall balance assessment: Needs assistance Sitting-balance support: No upper extremity supported;Feet supported Sitting balance-Leahy Scale: Good     Standing balance support: Bilateral upper extremity supported Standing balance-Leahy Scale: Fair                              Cognition Arousal/Alertness: Awake/alert Behavior During Therapy: WFL for tasks assessed/performed Overall Cognitive Status: Within Functional Limits for tasks assessed                                        Exercises General Exercises - Lower Extremity Ankle Circles/Pumps: AROM;Both;10 reps;Supine Quad Sets: AROM;Strengthening;Left;10 reps;Supine Long Arc Quad: AROM;Strengthening;Left;10 reps;Seated Heel Slides: AROM;Strengthening;Left;10 reps;Supine    General Comments General comments (skin integrity, edema, etc.): noticed mild SOB with ambulation, reinforced pursed lip breathing      Pertinent Vitals/Pain Pain Assessment: 0-10 Pain Score: 6  Pain Location: L hip Pain Descriptors / Indicators: Discomfort Pain Intervention(s): Repositioned;Monitored during session    Home Living                      Prior Function  PT Goals (current goals can now be found in the care plan section) Progress towards PT goals: Progressing toward goals    Frequency    Min 5X/week      PT Plan Current plan remains appropriate    Co-evaluation              AM-PAC PT "6 Clicks" Mobility   Outcome Measure  Help needed turning from your back to your side while in a flat bed without using bedrails?: None Help needed moving from lying on your back to sitting on  the side of a flat bed without using bedrails?: A Little Help needed moving to and from a bed to a chair (including a wheelchair)?: A Little Help needed standing up from a chair using your arms (e.g., wheelchair or bedside chair)?: A Little Help needed to walk in hospital room?: A Little Help needed climbing 3-5 steps with a railing? : A Little 6 Click Score: 19    End of Session Equipment Utilized During Treatment: Gait belt Activity Tolerance: Patient tolerated treatment well Patient left: with call bell/phone within reach;in chair Nurse Communication: Mobility status PT Visit Diagnosis: Pain;Difficulty in walking, not elsewhere classified (R26.2) Pain - Right/Left: Left Pain - part of body: Hip     Time: 7001-7494 PT Time Calculation (min) (ACUTE ONLY): 25 min  Charges:  $Gait Training: 8-22 mins $Therapeutic Exercise: 8-22 mins                      Lelon Mast 07/28/2020, 8:42 AM

## 2020-07-28 NOTE — Progress Notes (Addendum)
Pt is being discharged. IV was taken out, with catheter intact. IV team de-accessed his port in left upper chest. I went over AVS summary with pt and he had no further questions. Wheelchair and walker are going home with patient.

## 2020-07-28 NOTE — Progress Notes (Cosign Needed)
    Durable Medical Equipment  (From admission, onward)         Start     Ordered   07/28/20 1226  For home use only DME lightweight manual wheelchair with seat cushion  Once       Comments: Patient suffers from femoral fx which impairs their ability to perform daily activities like walking in the home.  A rolling walker will not resolve  issue with performing activities of daily living. A wheelchair will allow patient to safely perform daily activities. Patient is not able to propel themselves in the home using a standard weight wheelchair due to weakness. Patient can self propel in the lightweight wheelchair. Length of need 12 months. Accessories: elevating leg rests (ELRs), wheel locks, extensions and anti-tippers.   07/28/20 1228

## 2020-07-29 ENCOUNTER — Telehealth: Payer: Self-pay

## 2020-07-29 ENCOUNTER — Other Ambulatory Visit: Payer: Self-pay | Admitting: *Deleted

## 2020-07-29 NOTE — Patient Outreach (Signed)
[facility]) Care Management  07/29/2020  Mike [name] December 31, 1965 [id number]      Outreach attempt #1, successful.  Identity verified.  This care manager introduced self and stated purpose of call.  [facility] care management services explained.    Social: Lives with fiance and brother, report he does have some help with ADL's.  He is using a walker and wheelchair, able to get in his walk-in shower.  He does have home health for PT ordered.  Request resources for transportation assistance as he will need to be using his wheelchair more often.  Conditions: Recently admitted for repair of pathologic fracture of the femur.  Has lung cancer with metastasis, diabetes (controlled), GERD, and PE.  Medications: Reviewed with member, denies need for financial assistance.  Unsure of plan for ongoing chemotherapy, pending new scans.  Report he has immunotherapy every 3 weeks.  Appointments: Has appointment with radiology oncology on 11/1 and with oncologist on 11/2.  Denies any urgent concerns, encouraged to contact this care manager with questions.  Plan: RN CM will place referral to CSW and follow up with member within the next 2 weeks.  Goals Addressed            This Visit's Progress   . [facility] - Keep Pain Under Control       Follow Up Date 11/12   - develop a personal pain management plan - keep track of prescription refills - learn relaxation techniques - plan exercise or activity when pain is best controlled    Why is this important?   Day-to-day life can be hard when you have pain.  Even a small change in emotion or a physical problem can make pain better or worse.  Coping with pain depends on how the mind and body reacts to pain.  Pain medicine is just one piece of the treatment puzzle.  There are many tools to help manage pain. A combination of them can be used to best meet your needs.     Notes:     . [facility] - Make and Keep All Appointments       Follow Up  Date 11/12   - ask family or friend for a ride - call to cancel if needed - keep a calendar with appointment dates    Why is this important?   Part of staying healthy is seeing the doctor for follow-up care.  If you forget your appointments, there are some things you can do to stay on track.    Notes:  Referral to CSW for transportation    . [facility] - Manage Fatigue (Tiredness)       Follow Up Date 12/12   - eat healthy - maintain healthy weight - use meditation or relaxation techniques    Why is this important?   Cancer treatment and its side effects can drain your energy. It can keep you from doing things you would like to do.  There are many things that you can do to manage fatigue.    Notes:       Valente David, RN, MSN [place] [phone]

## 2020-07-29 NOTE — Telephone Encounter (Signed)
Transition Care Management Follow-up Telephone Call  Date of discharge and from where[facility]  How have you been since you were released from the hospital? Patient states that he is doing better. No complaints at this time.   Any questions or concerns? No  Items Reviewed:  Did the pt receive and understand the discharge instructions provided? Yes   Medications obtained and verified? Yes   Any new allergies since your discharge? No   Dietary orders reviewed? Yes  Do you have support at home? Yes   Functional Questionnaire: (I = Independent and D = Dependent) ADLs: I  Bathing/Dressing- I  Meal Prep- I  Eating- I  Maintaining continence- I  Transferring/Ambulation- I  Managing Meds- I  Follow up appointments reviewed:   PCP Hospital f/u appt confirmed? No  Patient states he will call back at a later date if he feels he needs to come in for a follow up visit.   [facility] f/u appt confirmed? follow up with orthopedic surgery   Are transportation arrangements needed? No   If their condition worsens, is the pt aware to call PCP or go to the Emergency Dept.? Yes  Was the patient provided with contact information for the PCP's office or ED? Yes  Was to pt encouraged to call back with questions or concerns? Yes

## 2020-07-30 ENCOUNTER — Inpatient Hospital Stay: Payer: 59

## 2020-07-30 ENCOUNTER — Inpatient Hospital Stay: Payer: 59 | Admitting: Physician Assistant

## 2020-07-30 ENCOUNTER — Telehealth: Payer: Self-pay | Admitting: Medical Oncology

## 2020-07-30 ENCOUNTER — Encounter: Payer: Self-pay | Admitting: *Deleted

## 2020-07-30 NOTE — Telephone Encounter (Signed)
Discharged 10/11-Post hip surgery Pt cancelled appts today due to recovering from Hip surgery and having pain.   F/u with orthopedics. 2 weeks.  Oncology -Next appt is Nov 2. Transportation to appts being arranged by [employer].  He is concerned to wait that long for [facility] treatment.  When does Mike [name] want to see him ?

## 2020-07-30 NOTE — Telephone Encounter (Signed)
Okay to wait until the next visit in November.  Thank you.

## 2020-07-31 ENCOUNTER — Other Ambulatory Visit: Payer: Self-pay | Admitting: Radiation Oncology

## 2020-07-31 ENCOUNTER — Encounter: Payer: Self-pay | Admitting: Radiation Oncology

## 2020-07-31 MED ORDER — MORPHINE SULFATE ER 30 MG PO TBCR
30.0000 mg | EXTENDED_RELEASE_TABLET | Freq: Two times a day (BID) | ORAL | 0 refills | Status: DC
Start: 2020-07-31 — End: 2020-08-12

## 2020-08-01 ENCOUNTER — Encounter: Payer: Self-pay | Admitting: Radiation Oncology

## 2020-08-01 ENCOUNTER — Other Ambulatory Visit: Payer: Self-pay | Admitting: Radiation Oncology

## 2020-08-01 ENCOUNTER — Telehealth: Payer: Self-pay

## 2020-08-01 DIAGNOSIS — C7951 Secondary malignant neoplasm of bone: Secondary | ICD-10-CM

## 2020-08-01 MED ORDER — OXYCODONE HCL 10 MG PO TABS: 10.0000 mg | ORAL_TABLET | ORAL | 0 refills | Status: DC | PRN

## 2020-08-01 NOTE — Telephone Encounter (Signed)
Patient had requested Oxycodone 10 mg refill today. Advised Dr. Sondra Come is not here but Dr. Isidore Moos is covering and states she will send in a refill of this just for the weekend. Patient advised Dr. Sondra Come can send in additional medicine if needed when he is here on Monday, 08/04/2020. Patient verbalized understanding.

## 2020-08-01 NOTE — Telephone Encounter (Signed)
Pt.notified

## 2020-08-04 ENCOUNTER — Encounter: Payer: Self-pay | Admitting: Radiation Oncology

## 2020-08-04 ENCOUNTER — Other Ambulatory Visit: Payer: Self-pay | Admitting: Radiation Oncology

## 2020-08-04 DIAGNOSIS — C7951 Secondary malignant neoplasm of bone: Secondary | ICD-10-CM

## 2020-08-04 MED ORDER — OXYCODONE HCL 10 MG PO TABS: 10.0000 mg | ORAL_TABLET | ORAL | 0 refills | Status: DC | PRN

## 2020-08-06 ENCOUNTER — Other Ambulatory Visit: Payer: 59

## 2020-08-06 ENCOUNTER — Ambulatory Visit: Payer: 59 | Admitting: Internal Medicine

## 2020-08-06 ENCOUNTER — Ambulatory Visit: Payer: 59

## 2020-08-07 ENCOUNTER — Telehealth: Payer: Self-pay

## 2020-08-07 ENCOUNTER — Other Ambulatory Visit: Payer: Self-pay | Admitting: Radiation Oncology

## 2020-08-07 DIAGNOSIS — C7951 Secondary malignant neoplasm of bone: Secondary | ICD-10-CM

## 2020-08-07 MED ORDER — OXYCODONE HCL 10 MG PO TABS: 10.0000 mg | ORAL_TABLET | ORAL | 0 refills | Status: DC | PRN

## 2020-08-07 NOTE — Telephone Encounter (Signed)
Patient's daughter  called and advised her Dr. Sondra Come can see her father on 10/26 at 2 pm with a CT sim at 230 pm. She is concerned that  He will be out of his Oxycodone or may need a different pain medicine. Daughter, Lattie Haw advised that Dr. Sondra Come will review his pain medicine and we will let her know if he makes any changes or refills his current pain medicine. Daughter verbalized understanding.

## 2020-08-07 NOTE — Telephone Encounter (Signed)
Call returned to patient's daughter, Mike [name]. She states her father is taking his long acting medicine morning and evening. He takes his Oxycodone 10 mg 2 tablets and then alternates in 3 hours with 1 tablet and then 2 tablets in 3 hours. She is concerned as his is in so much pain and it is his left lower back that feels like the other side did prior to radiation. She is worried as he is crying with the pain. She does not know if his medicine needs to be changed or if he  Needs to come in. She was advised Dr. Sondra Come will be made aware of his sx and I or Dr. Sondra Come will be back in touch with an answer. Patient given my direct phone #. Daughter Mike [name] # [phone]

## 2020-08-08 ENCOUNTER — Encounter: Payer: Self-pay | Admitting: *Deleted

## 2020-08-08 ENCOUNTER — Other Ambulatory Visit: Payer: Self-pay | Admitting: *Deleted

## 2020-08-08 ENCOUNTER — Telehealth: Payer: Self-pay

## 2020-08-08 NOTE — Patient Outreach (Signed)
[facility]) Care Management  08/08/2020  BUEL MOLDER 1966/05/27 [id number]   CSW was able to make initial contact with patient today to perform phone assessment, as well as assess and assist with social work needs and services.  CSW introduced self, explained role and types of services provided through [facility] ([place] [facility]).  CSW further explained to patient that CSW works with patient's RNCM, also with [facility], Valente David.  CSW then explained the reason for the call, indicating that Mrs. Orene Desanctis thought that patient would benefit from social work services and resources to assist with arranging transportation for patient to and from his physician appointments, as patient is mostly wheelchair bound at present.  CSW obtained two HIPAA compliant identifiers from patient, which included patient's name and date of birth.  Patient admitted that he was in need of transportation assistance, but has since been able to work out transportation arrangements through Chiropractor with [facility].  After thorough review of patient's EMR (Electronic Medical Record) in Epic, CSW noted that transportation was in the process of being arranged for him, via a transition of care case manager, prior to patient being discharged from the hospital.  Patient went on to explain that [employer] have been coordinated for him to all of his upcoming physician appointments, as well as to his treatment appointments, at the [facility].  CSW reminded patient of his free transportation benefit through [employer] (12 round-trip or 24 one-way rides), providing him with the contact information and explaining the referral process.    CSW also spoke with patient at length about Road to Recovery, through the [employer], which is also a free transportation service offered to cancer  patient's undergoing treatment, whether it be aggressive or palliative, providing patient with the contact information.  CSW offered to provide counseling and supportive services to patient, realizing that he recently learned that his lung cancer has now metastasized.  Patient denied the need for any additional social work services at this time, but took down CSW's contact information, agreeing to contact CSW directly if he changes his mind, or if additional social work needs arise in the near future.  CSW will notify Mrs. Lane of CSW's plans to close patient's case.  CSW will also route this note to patient's Primary Care Physician, Dr. [name], and send a Physician Case Closure Letter.  Nat Christen, BSW, MSW, LCSW  Licensed Education officer, [facility]  Mailing [place] [address], [place], [place] [address] Physical Address-[address] [place], [place] [address] Toll Free Main # [phone] Fax # [phone] Cell # [phone]  [email or web address]

## 2020-08-08 NOTE — Telephone Encounter (Signed)
TC to patient's daughter and ML on her VM that a refill of her father's short acting pain medicine (oxycodone 10 mg) was [facility] by Dr[facility] Sondra Come. Encouraged to call with questions.

## 2020-08-11 ENCOUNTER — Telehealth: Payer: Self-pay

## 2020-08-11 ENCOUNTER — Other Ambulatory Visit (HOSPITAL_BASED_OUTPATIENT_CLINIC_OR_DEPARTMENT_OTHER): Payer: Self-pay | Admitting: Radiation Oncology

## 2020-08-11 ENCOUNTER — Other Ambulatory Visit: Payer: Self-pay | Admitting: Physician Assistant

## 2020-08-11 DIAGNOSIS — C7951 Secondary malignant neoplasm of bone: Secondary | ICD-10-CM

## 2020-08-11 MED ORDER — METHOCARBAMOL 500 MG PO TABS: 500.0000 mg | ORAL_TABLET | Freq: Four times a day (QID) | ORAL | 0 refills | Status: DC | PRN

## 2020-08-11 MED ORDER — OXYCODONE-ACETAMINOPHEN 5-325 MG PO TABS: 1.0000 | ORAL_TABLET | Freq: Three times a day (TID) | ORAL | 0 refills | Status: DC | PRN

## 2020-08-11 NOTE — Telephone Encounter (Signed)
3 pm Patient's daughter Micah Flesher returned call after talking with this RN earlier. She reports the medication Dr. Sondra Come called in was not received by the pharmacy. Daughter advised [facility] for Percocet. Advised will ask Dr. Sondra Come to look into his submitted rx. Patient will be seen in the clinic tomorrow afternoon for f/u.

## 2020-08-11 NOTE — Telephone Encounter (Signed)
Sent in

## 2020-08-11 NOTE — Telephone Encounter (Signed)
Returned Patient's daughter's call, Mamie Nick. ML on her VM to return call.

## 2020-08-11 NOTE — Telephone Encounter (Signed)
Pt received oxycodone through ortho today.

## 2020-08-11 NOTE — Telephone Encounter (Signed)
Patient called he is requesting prescription refill for oxycodone and methocarbamol. Call [phone]

## 2020-08-11 NOTE — Telephone Encounter (Signed)
[facility] - Client TELEPHONE ADVICE RECORD [facility] Patient Name: Mike [name] Gender: Male DOB: 1966-03-04 Age: 54 Y 41 M 26 D Return Phone Number: [phone] (Primary), [phone] (Secondary) Address[place] [address] Client [facility] - Client Client Site [place] Physician [name] - MD Contact Type Call Who Is Calling Patient / Member / Family / Caregiver Call Type Triage / Clinical Caller Name Mike [name] Relationship To Patient Spouse Return Phone Number [phone] (Primary) Chief Complaint Prescription Refill or Medication Request (non symptomatic) Reason for Call Symptomatic / Request for Health Information Initial Comment caller states her husband has terminal cancer, he has a cyst that was overlooked on his hip, he is out of his pain meds and he is in a lot of pain, his oncologist does not have an on call so she is asking this office to help Translation No Nurse Assessment Nurse: Glean Salvo, RN, Magda Paganini Date/Time (Eastern Time): 08/10/2020 3:05:01 PM Confirm and document reason for call. If symptomatic, describe symptoms. ---Caller states her fiancee has terminal cancer and has run out of his pain medication, their oncology office does not have an oncall. Does the patient have any new or worsening symptoms? ---Yes Will a triage be completed? ---Yes Related visit to physician within the last 2 weeks? ---Yes Does the PT have any chronic conditions? (i.e. diabetes, asthma, this includes High risk factors for pregnancy, etc.) ---Yes List chronic conditions. ---Cancer Is this a behavioral health or substance abuse call? ---No Guidelines Guideline Title Affirmed Question Affirmed Notes Nurse Date/Time (Eastern Time) Hip Pain Can't stand (bear weight) or walk Rebecca Eaton 08/10/2020 3:09:13 PM Disp. Time Eilene Ghazi Time) Disposition  Final User 08/10/2020 3:11:00 PM Go to ED Now Yes Glean Salvo, RN, Magda Paganini PLEASE NOTE: All timestamps contained within this report are represented as [place] Standard Time. CONFIDENTIALTY NOTICE: This fax transmission is intended only for the addressee. It contains information that is legally privileged, confidential or otherwise protected from use or disclosure. If you are not the intended recipient, you are strictly prohibited from reviewing, disclosing, copying using or disseminating any of this information or taking any action in reliance on or regarding this information. If you have received this fax in error, please notify us immediately by telephone so that we can arrange for its return to [place]. Phone: [phone], Toll-Free: [phone], Fax: [phone] Page: 2 of 2 Call Id: [id number] [place] Disagree/Comply Comply Caller Understands Yes PreDisposition Go to ED Care Advice Given Per Guideline GO TO ED NOW: * You need to be seen in the Emergency Department. * Go to the ED at ___________ [place] now. Drive carefully. * Another adult should drive. NOTE TO TRIAGER - DRIVING: * If there are any problems with automobile transport (e.g., unable to get to the car), then ambulance transport may be necessary. * The patient, caregiver, or family members can arrange ambulance transport via private ambulance company or via EMS 911. CARE ADVICE given per Hip Pain (Adult) guideline. Referrals [facility] - ED

## 2020-08-11 NOTE — Telephone Encounter (Signed)
I was not able to speak with Lattie Haw and I spoke with pt and he is going to call oncology this morning about some pain medication for the hip pain he is having but pt said if someone could send in some pain med until he can get oncology he would be appreciative. [facility] DR G is out of office and sending note to him as PCP.

## 2020-08-12 ENCOUNTER — Encounter: Payer: Self-pay | Admitting: Radiation Oncology

## 2020-08-12 ENCOUNTER — Ambulatory Visit
Admission: RE | Admit: 2020-08-12 | Discharge: 2020-08-12 | Disposition: A | Discharge status: Home / Self Care | Payer: 59 | Source: Ambulatory Visit | Attending: Radiation Oncology | Admitting: Radiation Oncology

## 2020-08-12 ENCOUNTER — Other Ambulatory Visit: Payer: Self-pay

## 2020-08-12 ENCOUNTER — Ambulatory Visit: Payer: 59 | Admitting: Orthopaedic Surgery

## 2020-08-12 VITALS — BP 104/79 | HR 101 | Temp 98.2°F | Resp 20

## 2020-08-12 DIAGNOSIS — R5383 Other fatigue: Secondary | ICD-10-CM | POA: Insufficient documentation

## 2020-08-12 DIAGNOSIS — C3491 Malignant neoplasm of unspecified part of right bronchus or lung: Secondary | ICD-10-CM | POA: Diagnosis present

## 2020-08-12 DIAGNOSIS — M79652 Pain in left thigh: Secondary | ICD-10-CM | POA: Insufficient documentation

## 2020-08-12 DIAGNOSIS — Z79899 Other long term (current) drug therapy: Secondary | ICD-10-CM | POA: Diagnosis not present

## 2020-08-12 DIAGNOSIS — Z7901 Long term (current) use of anticoagulants: Secondary | ICD-10-CM | POA: Diagnosis not present

## 2020-08-12 DIAGNOSIS — C7951 Secondary malignant neoplasm of bone: Secondary | ICD-10-CM | POA: Diagnosis present

## 2020-08-12 DIAGNOSIS — C3411 Malignant neoplasm of upper lobe, right bronchus or lung: Secondary | ICD-10-CM | POA: Diagnosis not present

## 2020-08-12 DIAGNOSIS — Z7984 Long term (current) use of oral hypoglycemic drugs: Secondary | ICD-10-CM | POA: Diagnosis not present

## 2020-08-12 MED ORDER — OXYCODONE HCL 10 MG PO TABS
10.0000 mg | ORAL_TABLET | ORAL | 0 refills | Status: DC | PRN
Start: 2020-08-12 — End: 2020-08-18

## 2020-08-12 MED ORDER — MORPHINE SULFATE ER 60 MG PO TBCR
60.0000 mg | EXTENDED_RELEASE_TABLET | Freq: Two times a day (BID) | ORAL | 0 refills | Status: DC
Start: 2020-08-12 — End: 2020-08-26

## 2020-08-12 NOTE — Progress Notes (Signed)
Radiation Oncology         [phone] ________________________________  Name: DMITRY MACOMBER MRN: [id number]  Date: 08/12/2020  DOB: 01-23-1966  Follow-Up Visit Note  CC: [name], MD  [name], MD    ICD-10-CM   1. Stage IV squamous cell carcinoma of right lung [facility])  C34.91   2. Bony metastasis (HCC)  C79.51     Diagnosis: Stage IV (T2b, N3, M1C) non-small cell lung cancer, squamous cell carcinoma presented with right upper lobe lung mass in addition to mediastinal and right supraclavicular lymphadenopathy as well as multiple metastatic bone lesions  Interval Since Last Radiation: Three weeks and six days  Radiation Treatment Dates: 01/01/2020 through 01/14/2020 and 07/07/2020 through 07/16/2020 Site Technique Total Dose (Gy) Dose per Fx (Gy) Completed Fx Beam Energies  Hip, Right: Pelvis_Rt 3D 30/30 3 10/10 10X, 15X  Ribs, Right: Chest_Rt 3D 30/30 3 10/10 10X  Chest: Chest 3D 30/30 3 10/10 6X  Lung, Right: Lung_Rt 3D 20/20 2.5 8/8 10X, 15X  Ribs, Right: Chest_Rt 3D 20/20 2.5 8/8 15X    Narrative:  The patient returns today for routine follow-up. MRI of pelvis on 07/19/2020 showed osseous metastatic disease with the largest deposit in the base of the left femoral neck and intertrochanteric femur. There also appeared to be a small cortical break in the superior margin of the femoral neck, which put the patient at risk for a pathologic fracture.  The patient underwent a prophylactic stabilization of impending pathologic fracture of the left intertrochanteric femur on 07/25/2020 under the care of Dr. Erlinda Hong. Pathology from the procedure revealed squamous cell carcinoma.  On review of systems, he reports severe fatigue in the left pelvis area requiring increasing amounts of narcotics. He has minimal pain in the operative bed of the left femur he reports hearing a popping sound while trying to stand several days ago. He denies focal weakness in his left lower extremity  but does have difficulty standing in light of his pain. Patient will follow up with Dr. Erlinda Hong tomorrow concerning his surgery.                   ALLERGIES:  has No Known Allergies.  Meds: Current Outpatient Medications  Medication Sig Dispense Refill  . acetaminophen (TYLENOL) 325 MG tablet Take 1-2 tablets (325-650 mg total) by mouth every 6 (six) hours as needed for mild pain (pain score 1-3 or temp > 100.5).    [name] albuterol (PROVENTIL) (2.5 MG/3ML) 0.083% nebulizer solution Take 3 mLs (2.5 mg total) by nebulization every 6 (six) hours as needed for wheezing or shortness of breath. 75 mL 12  . apixaban (ELIQUIS) 5 MG TABS tablet Take 1 tablet (5 mg total) by mouth 2 (two) times daily. 60 tablet 3  . atorvastatin (LIPITOR) 20 MG tablet Take 1 tablet (20 mg total) by mouth at bedtime. 90 tablet 3  . diazepam (VALIUM) 5 MG tablet Take 1 tablet (5 mg total) by mouth every 12 (twelve) hours as needed for anxiety or muscle spasms. 30 tablet 0  . docusate sodium (COLACE) 100 MG capsule Take 1 capsule (100 mg total) by mouth 2 (two) times daily. 10 capsule 0  . metFORMIN (GLUCOPHAGE) 1000 MG tablet Take 1 tablet (1,000 mg total) by mouth 2 (two) times daily with a meal. 180 tablet 3  . methocarbamol (ROBAXIN) 500 MG tablet Take 1 tablet (500 mg total) by mouth every 6 (six) hours as needed for muscle spasms. 30 tablet 0  .  oxyCODONE-acetaminophen (PERCOCET) 5-325 MG tablet Take 1-2 tablets by mouth 3 (three) times daily as needed. 30 tablet 0  . sildenafil (REVATIO) 20 MG tablet Take 1-5 tablets (20-100 mg total) by mouth daily as needed (ED). (Patient taking differently: Take 40 mg by mouth daily as needed (ED). ) 30 tablet 3  . morphine (MS CONTIN) 60 MG 12 hr tablet Take 1 tablet (60 mg total) by mouth every 12 (twelve) hours. 30 tablet 0  . Oxycodone HCl 10 MG TABS Take 1 tablet (10 mg total) by mouth every 3 (three) hours as needed (for severe breakthrough pain). 60 tablet 0   No current  facility-administered medications for this encounter.    Physical Findings: The patient is in no acute distress. Patient is alert and oriented.  temperature is 98.2 F (36.8 C). His blood pressure is 104/79 and his pulse is 101 (abnormal). His respiration is 20 and oxygen saturation is 96%.  . Lungs are clear to auscultation bilaterally. Heart has regular rate and rhythm. No palpable cervical, supraclavicular, or axillary adenopathy. Abdomen soft, non-tender, normal bowel sounds. The patient reports pain along the left sacroiliac region+  Lab Findings: Lab Results  Component Value Date   WBC 8.9 07/27/2020   HGB 12.3 (L) 07/27/2020   HCT 37.5 (L) 07/27/2020   MCV 92.6 07/27/2020   PLT 248 07/27/2020    Radiographic Findings: MR Pelvis W Wo Contrast  Result Date: 07/20/2020 CLINICAL DATA:  Posterior left hip pain in a patient with a history of metastatic lung cancer. EXAM: MRI PELVIS WITHOUT AND WITH CONTRAST TECHNIQUE: Multiplanar multisequence MR imaging of the pelvis was performed both before and after administration of intravenous contrast. CONTRAST:  10 mL GADAVIST IV SOLN COMPARISON:  CT chest, abdomen and pelvis 06/06/2020. FINDINGS: Bones/Joint/Cartilage A metastatic deposit in the base of the left femoral neck and left intertrochanteric femur measures approximately 4.5 cm AP by 5 cm transverse by 6 cm craniocaudal. The deposit fills the base of the femoral neck. There appears to be a small focus of cortical destruction along the anterior, superior margin of the femoral neck. A second metastatic deposit in the right side of the S1 vertebral body is approximately 3.1 cm transverse by 2 cm craniocaudal by 2 cm AP. There is also a metastatic lesion in the posterior left ilium measuring 4 cm AP by 2 cm transverse by 3.8 cm craniocaudal. Previously seen metastatic lesion in the posterior right acetabulum is now cystic with a corticated margin consistent with prior treatment. There is  enhancing soft tissue about the periphery of the lesion worrisome for residual tumor. Finally, a punctate metastatic deposit is seen in the left acetabulum image 22 of series 12. Ligaments Intact. Muscles and Tendons Intact. Edema in the left hip adductors is likely due to strain from altered mechanics. No intramuscular mass or fluid collection. Soft tissues No acute or focal abnormality. IMPRESSION: Osseous metastatic disease as described above. Largest deposit is in the base of the left femoral neck and intertrochanteric femur. There appears to be a small cortical break in the superior margin of the femoral neck and the patient may be at risk for pathologic fracture. These results will be called to the ordering clinician or representative by the Radiologist Assistant, and communication documented in the PACS or [employer]. Electronically Signed   By: Inge Rise M.D.   On: 07/20/2020 12:18   DG C-Arm 1-60 Min  Result Date: 07/25/2020 CLINICAL DATA:  Known proximal left femoral metastatic disease  EXAM: OPERATIVE LEFT HIP WITH PELVIS; DG C-ARM 1-60 MIN COMPARISON:  MRI from 07/19/2020 FLUOROSCOPY TIME:  Fluoroscopy Time:  1 minutes 20 seconds Radiation Exposure Index (if provided by the fluoroscopic device): 30.15 mGy Number of Acquired Spot Images: 4 FINDINGS: Medullary rod is seen in the left femur with proximal and distal fixation screws. The known proximal femoral metastatic lesion is not well appreciated on the spot films. IMPRESSION: Fixation of proximal left femoral metastatic disease. Electronically Signed   By: Inez Catalina M.D.   On: 07/25/2020 18:47   DG HIP OPERATIVE UNILAT W OR W/O PELVIS LEFT  Result Date: 07/25/2020 CLINICAL DATA:  Known proximal left femoral metastatic disease EXAM: OPERATIVE LEFT HIP WITH PELVIS; DG C-ARM 1-60 MIN COMPARISON:  MRI from 07/19/2020 FLUOROSCOPY TIME:  Fluoroscopy Time:  1 minutes 20 seconds Radiation Exposure Index (if provided by the fluoroscopic  device): 30.15 mGy Number of Acquired Spot Images: 4 FINDINGS: Medullary rod is seen in the left femur with proximal and distal fixation screws. The known proximal femoral metastatic lesion is not well appreciated on the spot films. IMPRESSION: Fixation of proximal left femoral metastatic disease. Electronically Signed   By: Inez Catalina M.D.   On: 07/25/2020 18:47    Impression: Stage IV (T2b, N3, M1C) non-small cell lung cancer, squamous cell carcinoma presented with right upper lobe lung mass in addition to mediastinal and right supraclavicular lymphadenopathy as well as multiple metastatic bone lesions  The patient likely has additional disease in the left pelvis area explaining his pain. He will proceed with CT simulation later this afternoon to set up postop treatments to the left femur region and to likely set up treatments directed a new disease along the left pelvis region  Plan: The patient is scheduled to follow up with Dr. Julien Nordmann on 08/19/2020. He will follow-up with radiation oncology in 2 days after his planning is complete to start additional treatment. anticipate 10 treatments to the left femur region and 10 treatments to the disease in the left pelvis region. Given the severity of the patient's pain I have doubled his MS Contin to 60 mg twice daily. He also has been given a refill on his 10 mg oxycodone for breakthrough pain.     Blair Promise, PhD, MD  This document serves as a record of services personally performed by Gery Pray, MD. It was created on his behalf by Clerance Lav, a trained medical scribe. The creation of this record is based on the scribe's personal observations and the provider's statements to them. This document has been checked and approved by the attending provider.

## 2020-08-12 NOTE — Progress Notes (Incomplete)
  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  Cancer Staging: Stage IV (T2b, N3, M1C) non-small cell lung cancer, squamous cell carcinoma presented with right upper lobe lung mass in addition to mediastinal and right supraclavicular lymphadenopathy as well as multiple metastatic bone lesions  Intent: Palliative  Radiation Treatment Dates: 01/01/2020 through 01/14/2020 and 07/07/2020 through 07/16/2020 Site Technique Total Dose (Gy) Dose per Fx (Gy) Completed Fx Beam Energies  Hip, Right: Pelvis_Rt 3D 30/30 3 10/10 10X, 15X  Ribs, Right: Chest_Rt 3D 30/30 3 10/10 10X  Chest: Chest 3D 30/30 3 10/10 6X  Lung, Right: Lung_Rt 3D 20/20 2.5 8/8 10X, 15X  Ribs, Right: Chest_Rt 3D 20/20 2.5 8/8 15X   Narrative: The patient tolerated radiation therapy relatively well.  First treatment in March: During the second week of treatment, he began to report increased cough with clear phlegm and the inability to lay flat when sleeping. Although he also continued to report right hip pain, he did report overall improvement since beginning radiation therapy. He denied hemoptysis and difficulty swallowing.  Second treatment in September: He did report flutuating appetite, productive cough wiht clear sputum, and increasing pain in the left pelvic region. CT scan did not show any obvious explanation for the pain. MRI of pelvis ordered for 07/19/2020. The patient was started on Oxycodone and Morphine, which did relieve the pain. He  denied hemoptysis, difficulty swallowing, skin irritation, and shortness of breath.  Plan: The patient will follow-up with radiation oncology in one month.  ________________________________________________   Blair Promise, PhD, MD  This document serves as a record of services personally performed by Gery Pray, MD. It was created on his behalf by Clerance Lav, a trained medical scribe. The creation of this record is based on the scribe's personal observations and the provider's statements to them. This document has been checked and approved by the attending provider.

## 2020-08-12 NOTE — Progress Notes (Signed)
Patient hjere for a f/u visit with Dr. Sondra Come. Patient having severe pain most of the time in his lower back and left hip. Patient is taking his ER Morphine every 8 hours and Hydrocodone every 1 1/2 hours. Patient would still like Oxycodone 10 mg and Methocarbamol if possible.  BP 104/79 (BP Location: Left Arm, Patient Position: Sitting, Cuff Size: Large)   Pulse (!) 101   Temp 98.2 F (36.8 C)   Resp 20   SpO2 96%   Wt Readings from Last 3 Encounters:  07/24/20 250 lb (113.4 kg)  07/08/20 264 lb (119.7 kg)  06/18/20 270 lb 2 oz (122.5 kg)

## 2020-08-13 ENCOUNTER — Ambulatory Visit: Payer: Self-pay

## 2020-08-13 ENCOUNTER — Encounter: Payer: Self-pay | Admitting: Orthopaedic Surgery

## 2020-08-13 ENCOUNTER — Ambulatory Visit (INDEPENDENT_AMBULATORY_CARE_PROVIDER_SITE_OTHER): Payer: 59 | Admitting: Orthopaedic Surgery

## 2020-08-13 DIAGNOSIS — C3491 Malignant neoplasm of unspecified part of right bronchus or lung: Secondary | ICD-10-CM | POA: Diagnosis not present

## 2020-08-13 DIAGNOSIS — M25552 Pain in left hip: Secondary | ICD-10-CM | POA: Diagnosis not present

## 2020-08-13 MED ORDER — METHOCARBAMOL 500 MG PO TABS: 500.0000 mg | ORAL_TABLET | Freq: Four times a day (QID) | ORAL | 6 refills | Status: DC | PRN

## 2020-08-13 NOTE — Progress Notes (Signed)
Post-Op Visit Note   Patient: Mike [name]           Date of Birth: September 10, 1966           MRN: [id number] Visit Date: 08/13/2020 PCP: [name], MD   Assessment & Plan:  Chief Complaint:  Chief Complaint  Patient presents with  . Left Hip - Pain   Visit Diagnoses:  1. Pain in left hip     Plan: [name] returns today status post prophylactic stabilization of an impending pathologic fracture of the left proximal femur.  He is progressively getting better and he feels better every day.  He has his first radiation treatment scheduled for tomorrow.  He has no complaints.  Continues to take oxycodone and morphine as needed.  Mainly ambulating in a wheelchair.  Surgical incisions are all healed.  His sutures removed today.  He may continue to weight-bear as tolerated.  Recheck in 4 weeks with two-view x-rays of the left hip.  Follow-Up Instructions: Return in about 4 weeks (around 09/10/2020).   Orders:  Orders Placed This Encounter  Procedures  . XR HIP UNILAT W OR W/O PELVIS 2-3 VIEWS LEFT   Meds ordered this encounter  Medications  . methocarbamol (ROBAXIN) 500 MG tablet    Sig: Take 1 tablet (500 mg total) by mouth every 6 (six) hours as needed for muscle spasms.    Dispense:  60 tablet    Refill:  6    Imaging: XR HIP UNILAT W OR W/O PELVIS 2-3 VIEWS LEFT  Result Date: 08/13/2020 No hardware complications.     PMFS History: Patient Active Problem List   Diagnosis Date Noted  . Femoral fracture ([place]) 07/24/2020  . Impending pathologic fracture 07/22/2020  . Situational depression 02/22/2020  . UTI due to extended-spectrum beta lactamase (ESBL) producing Escherichia coli 02/10/2020  . Abnormal thyroid function test 02/10/2020  . Port-A-Cath in place 01/29/2020  . Acute pulmonary embolism ([place]) 01/20/2020  . Stage IV squamous cell carcinoma of right lung ([place]) 12/24/2019  . Goals of care, counseling/discussion 12/24/2019  . Encounter for antineoplastic  chemotherapy 12/24/2019  . Encounter for antineoplastic immunotherapy 12/24/2019  . Rib pain on right side 12/12/2019  . Bony metastasis ([place]) 12/12/2019  . Fatty liver 12/05/2019  . Encounter for well adult exam with abnormal findings 11/25/2019  . Chest pain 11/25/2019  . GERD (gastroesophageal reflux disease) 11/25/2019  . Dyslipidemia associated with type 2 diabetes mellitus ([name]) 11/25/2019  . Ex-smoker 09/15/2019  . OSA (obstructive sleep apnea) 09/15/2019  . Severe obesity (BMI 35.0-39.9) with comorbidity ([place]) 09/04/2019  . Erectile dysfunction 10/18/2014  . Type 2 diabetes mellitus with other specified complication ([place]) [id number]/63/[id number]   Past Medical History:  Diagnosis Date  . Arthritis    neck  . Cancer, metastatic to bone ([place]) dx'd 10/2019  . Diabetes ([place])   . Fatty liver 12/05/2019   By [place] 11/2019  . GERD (gastroesophageal reflux disease)   . 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 High cholesterol   . IBS (irritable bowel syndrome)   . lung ca dx'd 10/2019   lung stage 4   . OSA (obstructive sleep apnea) 09/15/2019   Sleep study 12/2017 - severe OSA with AHI 65.6, desat to 76% rec CPAP  . Severe obesity (BMI 35.0-39.9) with comorbidity ([place]) 09/04/2019    Family History  Problem Relation Age of Onset  . CAD Mother        stents  .  Stroke Neg Hx   . Diabetes Neg Hx   . Cancer Neg Hx   . Colon cancer Neg Hx   . Esophageal cancer Neg Hx   . Stomach cancer Neg Hx   . Rectal cancer Neg Hx     Past Surgical History:  Procedure Laterality Date  . BIOPSY  12/18/2019   Procedure: BIOPSY;  Surgeon: Garner Nash, DO;  Location: [facility];  Service: Pulmonary;;  . BRONCHIAL BRUSHINGS  12/18/2019   Procedure: BRONCHIAL BRUSHINGS;  Surgeon: Garner Nash, DO;  Location: [facility];  Service: Pulmonary;;  . BRONCHIAL WASHINGS  12/18/2019   Procedure: BRONCHIAL WASHINGS;  Surgeon: Garner Nash, DO;  Location: [facility];  Service: Pulmonary;;  .  COLONOSCOPY  10/2019   mult TA, int hem, rpt 1 yr ([place])  . CYSTECTOMY     knee- left knee cyst  . ENDOBRONCHIAL ULTRASOUND  12/18/2019   Procedure: ENDOBRONCHIAL ULTRASOUND;  Surgeon: Garner Nash, DO;  Location: [facility];  Service: Pulmonary;;  . FINE NEEDLE ASPIRATION  12/18/2019   Procedure: FINE NEEDLE ASPIRATION;  Surgeon: Garner Nash, DO;  Location: [facility];  Service: Pulmonary;;  . INTRAMEDULLARY (IM) NAIL INTERTROCHANTERIC Left 07/25/2020   Procedure: LEFT INTERTROCHANTERIC INTRAMEDULLARY (IM) NAIL;  Surgeon: Leandrew Koyanagi, MD;  Location: [facility];  Service: Orthopedics;  Laterality: Left;  . IR IMAGING GUIDED PORT INSERTION  12/27/2019  . PILONIDAL CYST EXCISION    . VIDEO BRONCHOSCOPY WITH ENDOBRONCHIAL ULTRASOUND Right 12/18/2019   Procedure: VIDEO BRONCHOSCOPY;  Surgeon: Garner Nash, DO;  Location: [facility];  Service: Pulmonary;  Laterality: Right;   Social History   Occupational History  . Occupation: Applying for Disability  Tobacco Use  . Smoking status: Former Smoker    Packs/day: 3.00    Years: 40.00    Pack years: 120.00    Quit date: 01/18/2020    Years since quitting: 0.5  . Smokeless tobacco: Never Used  . Tobacco comment: currently smoking less than 0.5ppd  Vaping Use  . Vaping Use: Never used  Substance and Sexual Activity  . Alcohol use: Yes    Comment: Rarely  . Drug use: Never  . Sexual activity: Yes    Partners: Female

## 2020-08-14 ENCOUNTER — Telehealth: Payer: Self-pay | Admitting: Family Medicine

## 2020-08-14 ENCOUNTER — Other Ambulatory Visit: Payer: Self-pay | Admitting: *Deleted

## 2020-08-14 ENCOUNTER — Other Ambulatory Visit: Payer: Self-pay

## 2020-08-14 ENCOUNTER — Ambulatory Visit
Admission: RE | Admit: 2020-08-14 | Discharge: 2020-08-14 | Disposition: A | Discharge status: Home / Self Care | Payer: 59 | Source: Ambulatory Visit | Attending: Radiation Oncology | Admitting: Radiation Oncology

## 2020-08-14 DIAGNOSIS — C3491 Malignant neoplasm of unspecified part of right bronchus or lung: Secondary | ICD-10-CM

## 2020-08-14 NOTE — Patient Outreach (Signed)
[facility]) Care Management  08/14/2020  Mike [name] 1965/12/05 [id number]   Call placed to member to follow up on management of cancer and recent surgery recovery.  State he is still having issues with pain, but it's better than previously.  Relates the pain to the metastatic disease rather than surgical pain.  Was seen by ortho surgeon yesterday, sutures were removed, no signs of infection.  Next ortho follow up on 11/24.  He will have his first radiation treatment today, will have them daily for 10 business days.  Next immunotherapy treatment scheduled for 11/14.  Will see radiology oncologist and regular oncologist on 11/1 and 11/2.  Dic[place] member's support team, state he is happy with his family (significant other, brother, and daughter).  Also involved with [employer] cancer support program.  Denies any urgent concerns, agrees to follow up within the next month.  Goals Addressed            This Visit's Progress   . [facility] - Keep Pain Under Control   On track    Follow Up Date 11/12   - develop a personal pain management plan - keep track of prescription refills - learn relaxation techniques - plan exercise or activity when pain is best controlled    Why is this important?   Day-to-day life can be hard when you have pain.  Even a small change in emotion or a physical problem can make pain better or worse.  Coping with pain depends on how the mind and body reacts to pain.  Pain medicine is just one piece of the treatment puzzle.  There are many tools to help manage pain. A combination of them can be used to best meet your needs.     Notes:     . [facility] - Make and Keep All Appointments   On track    Follow Up Date 11/12   - ask family or friend for a ride - call to cancel if needed - keep a calendar with appointment dates    Why is this important?   Part of staying healthy is seeing the doctor for follow-up care.  If you forget your appointments, there are some  things you can do to stay on track.    Notes:  Referral to CSW for transportation    . [facility] - Manage Fatigue (Tiredness)   On track    Follow Up Date 12/12   - eat healthy - maintain healthy weight - use meditation or relaxation techniques    Why is this important?   Cancer treatment and its side effects can drain your energy. It can keep you from doing things you would like to do.  There are many things that you can do to manage fatigue.    Notes:       Valente David, RN, MSN [place] [phone]

## 2020-08-14 NOTE — Telephone Encounter (Signed)
   Mike [name] DOB: 09/01/1966 MRN: [id number]   RIDER WAIVER AND RELEASE OF LIABILITY  For purposes of improving physical access to our facilities, [facility] is pleased to partner with third parties to provide [facility] patients or other authorized individuals the option of convenient, on-demand ground transportation services (the Technical bre[employer]") through use of the technology service that enables users to request on-demand ground transportation from independent third-party providers.  By opting to use and accept these [employer], I, the undersigned, hereby agree on behalf of myself, and on behalf of any minor child using the [employer] for whom I am the parent or legal guardian, as follows:  1. Government social research officer provided to me are provided by independent third-party transportation providers who are not [employer] or employees and who are unaffiliated with [employer]. 2. [facility] is neither a transportation carrier nor a common or public carrier. 3. [facility] has no control over the quality or safety of the transportation that occurs as a result of the [employer]. 4. [facility] cannot guarantee that any third-party transportation provider will complete any arranged transportation service. 5. [facility] makes no representation, warranty, or guarantee regarding the reliability, timeliness, quality, safety, suitability, or availability of any of the Transport Services or that they will be error free. 6. I fully understand that traveling by vehicle involves risks and dangers of serious bodily injury, including permanent disability, paralysis, and death. I agree, on behalf of myself and on behalf of any minor child using the Transport Services for whom I am the parent or legal guardian, that the entire risk arising out of my use of the [employer] remains solely with me, to the maximum extent permitted under applicable law. 7. The [employer] are provided "as is" and "as available." [facility] disclaims all representations and warranties, express, implied or statutory, not expressly set out in these terms, including the implied warranties of merchantability and fitness for a particular purpose. 8. I hereby waive and release [facility], its agents, employees, officers, directors, representatives, insurers, attorneys, assigns, successors, subsidiaries, and affiliates from any and all past, present, or future claims, demands, liabilities, actions, causes of action, or suits of any kind directly or indirectly arising from acceptance and use of the [employer]. 9. I further waive and release [facility] and its affiliates from all present and future liability and responsibility for any injury or death to persons or damages to property caused by or related to the use of the [employer]. 10. I have read this Waiver and Release of Liability, and I understand the terms used in it and their legal significance. This Waiver is freely and voluntarily given with the understanding that my right (as well as the right of any minor child for whom I am the parent or legal guardian using the [employer]) to legal recourse against  in connection with the [employer] is knowingly surrendered in return for use of these services.   I attest that I read the consent document to Mike [name], gave Mike [name] the opportunity to ask questions and answered the questions asked (if any). I affirm that Mike [name] then provided consent for he's participation in this program.     Mike [name]

## 2020-08-15 ENCOUNTER — Other Ambulatory Visit: Payer: Self-pay

## 2020-08-15 ENCOUNTER — Ambulatory Visit
Admission: RE | Admit: 2020-08-15 | Discharge: 2020-08-15 | Disposition: A | Discharge status: Home / Self Care | Payer: 59 | Source: Ambulatory Visit | Attending: Radiation Oncology | Admitting: Radiation Oncology

## 2020-08-15 DIAGNOSIS — C3491 Malignant neoplasm of unspecified part of right bronchus or lung: Secondary | ICD-10-CM | POA: Diagnosis not present

## 2020-08-18 ENCOUNTER — Ambulatory Visit
Admission: RE | Admit: 2020-08-18 | Discharge: 2020-08-18 | Disposition: A | Discharge status: Home / Self Care | Payer: 59 | Source: Ambulatory Visit | Attending: Radiation Oncology | Admitting: Radiation Oncology

## 2020-08-18 ENCOUNTER — Other Ambulatory Visit: Payer: Self-pay

## 2020-08-18 ENCOUNTER — Other Ambulatory Visit: Payer: Self-pay | Admitting: Radiation Oncology

## 2020-08-18 DIAGNOSIS — C3491 Malignant neoplasm of unspecified part of right bronchus or lung: Secondary | ICD-10-CM

## 2020-08-18 DIAGNOSIS — C7951 Secondary malignant neoplasm of bone: Secondary | ICD-10-CM

## 2020-08-18 LAB — CMP (CANCER CENTER ONLY)
ALT: 12 U/L (ref 0–44)
AST: 19 U/L (ref 15–41)
Albumin: 2.7 g/dL — ABNORMAL LOW (ref 3.5–5.0)
Alkaline Phosphatase: 97 U/L (ref 38–126)
Anion gap: 5 (ref 5–15)
BUN: 10 mg/dL (ref 6–20)
CO2: 34 mmol/L — ABNORMAL HIGH (ref 22–32)
Calcium: 9.8 mg/dL (ref 8.9–10.3)
Chloride: 96 mmol/L — ABNORMAL LOW (ref 98–111)
Creatinine: 0.76 mg/dL (ref 0.61–1.24)
GFR, Estimated: >60 mL/min (ref >60)
Glucose, Bld: 91 mg/dL (ref 70–99)
Potassium: 4.6 mmol/L (ref 3.5–5.1)
Sodium: 135 mmol/L (ref 135–145)
Total Bilirubin: 0.3 mg/dL (ref 0.3–1.2)
Total Protein: 7 g/dL (ref 6.5–8.1)

## 2020-08-18 LAB — TSH: TSH: 2.332 u[IU]/mL (ref 0.320–4.118)

## 2020-08-18 LAB — CBC WITH DIFFERENTIAL (CANCER CENTER ONLY)
Abs Immature Granulocytes: 0.03 10*3/uL (ref 0.00–0.07)
Basophils Absolute: 0.1 10*3/uL (ref 0.0–0.1)
Basophils Relative: 1 %
Eosinophils Absolute: 0.2 10*3/uL (ref 0.0–0.5)
Eosinophils Relative: 3 %
HCT: 36.4 % — ABNORMAL LOW (ref 39.0–52.0)
Hemoglobin: 11.5 g/dL — ABNORMAL LOW (ref 13.0–17.0)
Immature Granulocytes: 1 %
Lymphocytes Relative: 15 %
Lymphs Abs: 0.9 10*3/uL (ref 0.7–4.0)
MCH: 29 pg (ref 26.0–34.0)
MCHC: 31.6 g/dL (ref 30.0–36.0)
MCV: 91.7 fL (ref 80.0–100.0)
Monocytes Absolute: 0.5 10*3/uL (ref 0.1–1.0)
Monocytes Relative: 9 %
Neutro Abs: 4.4 10*3/uL (ref 1.7–7.7)
Neutrophils Relative %: 71 %
Platelet Count: 268 10*3/uL (ref 150–400)
RBC: 3.97 MIL/uL — ABNORMAL LOW (ref 4.22–5.81)
RDW: 14.3 % (ref 11.5–15.5)
WBC Count: 6 10*3/uL (ref 4.0–10.5)
nRBC: 0 % (ref 0.0–0.2)

## 2020-08-18 MED ORDER — OXYCODONE HCL 10 MG PO TABS
10.0000 mg | ORAL_TABLET | ORAL | 0 refills | Status: DC | PRN
Start: 2020-08-18 — End: 2020-08-26

## 2020-08-19 ENCOUNTER — Inpatient Hospital Stay: Payer: 59

## 2020-08-19 ENCOUNTER — Telehealth: Payer: Self-pay | Admitting: Medical Oncology

## 2020-08-19 ENCOUNTER — Inpatient Hospital Stay: Payer: 59 | Attending: Physician Assistant | Admitting: Internal Medicine

## 2020-08-19 ENCOUNTER — Ambulatory Visit: Payer: 59

## 2020-08-19 DIAGNOSIS — G4733 Obstructive sleep apnea (adult) (pediatric): Secondary | ICD-10-CM | POA: Insufficient documentation

## 2020-08-19 DIAGNOSIS — C787 Secondary malignant neoplasm of liver and intrahepatic bile duct: Secondary | ICD-10-CM | POA: Insufficient documentation

## 2020-08-19 DIAGNOSIS — E7800 Pure hypercholesterolemia, unspecified: Secondary | ICD-10-CM | POA: Insufficient documentation

## 2020-08-19 DIAGNOSIS — K589 Irritable bowel syndrome without diarrhea: Secondary | ICD-10-CM | POA: Insufficient documentation

## 2020-08-19 DIAGNOSIS — Z7984 Long term (current) use of oral hypoglycemic drugs: Secondary | ICD-10-CM | POA: Insufficient documentation

## 2020-08-19 DIAGNOSIS — C3411 Malignant neoplasm of upper lobe, right bronchus or lung: Secondary | ICD-10-CM | POA: Insufficient documentation

## 2020-08-19 DIAGNOSIS — Z7901 Long term (current) use of anticoagulants: Secondary | ICD-10-CM | POA: Insufficient documentation

## 2020-08-19 DIAGNOSIS — Z79899 Other long term (current) drug therapy: Secondary | ICD-10-CM | POA: Insufficient documentation

## 2020-08-19 DIAGNOSIS — Z923 Personal history of irradiation: Secondary | ICD-10-CM | POA: Insufficient documentation

## 2020-08-19 DIAGNOSIS — K219 Gastro-esophageal reflux disease without esophagitis: Secondary | ICD-10-CM | POA: Insufficient documentation

## 2020-08-19 DIAGNOSIS — C7951 Secondary malignant neoplasm of bone: Secondary | ICD-10-CM | POA: Insufficient documentation

## 2020-08-19 DIAGNOSIS — E119 Type 2 diabetes mellitus without complications: Secondary | ICD-10-CM | POA: Insufficient documentation

## 2020-08-19 DIAGNOSIS — Z5112 Encounter for antineoplastic immunotherapy: Secondary | ICD-10-CM | POA: Insufficient documentation

## 2020-08-19 NOTE — Telephone Encounter (Signed)
VM left to call about his appt today -He did not show up.

## 2020-08-20 ENCOUNTER — Other Ambulatory Visit: Payer: Self-pay

## 2020-08-20 ENCOUNTER — Ambulatory Visit
Admission: RE | Admit: 2020-08-20 | Discharge: 2020-08-20 | Disposition: A | Discharge status: Home / Self Care | Payer: 59 | Source: Ambulatory Visit | Attending: Radiation Oncology | Admitting: Radiation Oncology

## 2020-08-20 DIAGNOSIS — C3491 Malignant neoplasm of unspecified part of right bronchus or lung: Secondary | ICD-10-CM | POA: Diagnosis not present

## 2020-08-21 ENCOUNTER — Other Ambulatory Visit: Payer: Self-pay | Admitting: Radiation Oncology

## 2020-08-21 ENCOUNTER — Ambulatory Visit
Admission: RE | Admit: 2020-08-21 | Discharge: 2020-08-21 | Disposition: A | Discharge status: Home / Self Care | Payer: 59 | Source: Ambulatory Visit | Attending: Radiation Oncology | Admitting: Radiation Oncology

## 2020-08-21 DIAGNOSIS — C3491 Malignant neoplasm of unspecified part of right bronchus or lung: Secondary | ICD-10-CM | POA: Diagnosis not present

## 2020-08-21 MED ORDER — FUROSEMIDE 20 MG PO TABS: 20.0000 mg | ORAL_TABLET | Freq: Every day | ORAL | 0 refills | Status: DC

## 2020-08-22 ENCOUNTER — Telehealth: Payer: Self-pay

## 2020-08-22 ENCOUNTER — Ambulatory Visit
Admission: RE | Admit: 2020-08-22 | Discharge: 2020-08-22 | Disposition: A | Discharge status: Home / Self Care | Payer: 59 | Source: Ambulatory Visit | Attending: Radiation Oncology | Admitting: Radiation Oncology

## 2020-08-22 DIAGNOSIS — C3491 Malignant neoplasm of unspecified part of right bronchus or lung: Secondary | ICD-10-CM | POA: Diagnosis not present

## 2020-08-22 NOTE — Telephone Encounter (Signed)
Patient called per Dr. Clabe Seal request and advised that he had a new rx for Lasix 20 mg at this pharmacy to start taking as directed. Patient verbalized understanding.

## 2020-08-25 ENCOUNTER — Ambulatory Visit
Admission: RE | Admit: 2020-08-25 | Discharge: 2020-08-25 | Disposition: A | Discharge status: Home / Self Care | Payer: 59 | Source: Ambulatory Visit | Attending: Radiation Oncology | Admitting: Radiation Oncology

## 2020-08-25 DIAGNOSIS — C3491 Malignant neoplasm of unspecified part of right bronchus or lung: Secondary | ICD-10-CM | POA: Diagnosis not present

## 2020-08-26 ENCOUNTER — Other Ambulatory Visit: Payer: Self-pay | Admitting: Radiation Oncology

## 2020-08-26 ENCOUNTER — Ambulatory Visit
Admission: RE | Admit: 2020-08-26 | Discharge: 2020-08-26 | Disposition: A | Discharge status: Home / Self Care | Payer: 59 | Source: Ambulatory Visit | Attending: Radiation Oncology | Admitting: Radiation Oncology

## 2020-08-26 DIAGNOSIS — C7951 Secondary malignant neoplasm of bone: Secondary | ICD-10-CM

## 2020-08-26 DIAGNOSIS — C3491 Malignant neoplasm of unspecified part of right bronchus or lung: Secondary | ICD-10-CM | POA: Diagnosis not present

## 2020-08-26 MED ORDER — OXYCODONE HCL 10 MG PO TABS
10.0000 mg | ORAL_TABLET | ORAL | 0 refills | Status: DC | PRN
Start: 2020-08-26 — End: 2020-09-02

## 2020-08-26 MED ORDER — MORPHINE SULFATE ER 60 MG PO TBCR
60.0000 mg | EXTENDED_RELEASE_TABLET | Freq: Two times a day (BID) | ORAL | 0 refills | Status: DC
Start: 2020-08-26 — End: 2020-09-16

## 2020-08-27 ENCOUNTER — Ambulatory Visit: Payer: 59

## 2020-08-27 ENCOUNTER — Ambulatory Visit
Admission: RE | Admit: 2020-08-27 | Discharge: 2020-08-27 | Disposition: A | Discharge status: Home / Self Care | Payer: 59 | Source: Ambulatory Visit | Attending: Radiation Oncology | Admitting: Radiation Oncology

## 2020-08-27 ENCOUNTER — Telehealth: Payer: Self-pay | Admitting: Orthopaedic Surgery

## 2020-08-27 DIAGNOSIS — C3491 Malignant neoplasm of unspecified part of right bronchus or lung: Secondary | ICD-10-CM | POA: Diagnosis not present

## 2020-08-27 NOTE — Telephone Encounter (Signed)
Guardian forms received. Sent to [employer].

## 2020-08-28 ENCOUNTER — Other Ambulatory Visit: Payer: Self-pay

## 2020-08-28 ENCOUNTER — Ambulatory Visit
Admission: RE | Admit: 2020-08-28 | Discharge: 2020-08-28 | Disposition: A | Discharge status: Home / Self Care | Payer: 59 | Source: Ambulatory Visit | Attending: Radiation Oncology | Admitting: Radiation Oncology

## 2020-08-28 ENCOUNTER — Encounter: Payer: Self-pay | Admitting: Internal Medicine

## 2020-08-28 ENCOUNTER — Inpatient Hospital Stay: Payer: 59

## 2020-08-28 ENCOUNTER — Inpatient Hospital Stay (HOSPITAL_BASED_OUTPATIENT_CLINIC_OR_DEPARTMENT_OTHER): Payer: 59 | Admitting: Internal Medicine

## 2020-08-28 VITALS — BP 125/73 | HR 103 | Temp 98.7°F | Resp 18 | Ht 71.0 in

## 2020-08-28 DIAGNOSIS — Z923 Personal history of irradiation: Secondary | ICD-10-CM | POA: Diagnosis not present

## 2020-08-28 DIAGNOSIS — C3490 Malignant neoplasm of unspecified part of unspecified bronchus or lung: Secondary | ICD-10-CM | POA: Diagnosis not present

## 2020-08-28 DIAGNOSIS — C787 Secondary malignant neoplasm of liver and intrahepatic bile duct: Secondary | ICD-10-CM | POA: Diagnosis not present

## 2020-08-28 DIAGNOSIS — E7800 Pure hypercholesterolemia, unspecified: Secondary | ICD-10-CM | POA: Diagnosis not present

## 2020-08-28 DIAGNOSIS — Z7901 Long term (current) use of anticoagulants: Secondary | ICD-10-CM | POA: Diagnosis not present

## 2020-08-28 DIAGNOSIS — K219 Gastro-esophageal reflux disease without esophagitis: Secondary | ICD-10-CM | POA: Diagnosis not present

## 2020-08-28 DIAGNOSIS — G4733 Obstructive sleep apnea (adult) (pediatric): Secondary | ICD-10-CM | POA: Diagnosis not present

## 2020-08-28 DIAGNOSIS — C7951 Secondary malignant neoplasm of bone: Secondary | ICD-10-CM

## 2020-08-28 DIAGNOSIS — C3491 Malignant neoplasm of unspecified part of right bronchus or lung: Secondary | ICD-10-CM

## 2020-08-28 DIAGNOSIS — Z5112 Encounter for antineoplastic immunotherapy: Secondary | ICD-10-CM

## 2020-08-28 DIAGNOSIS — K589 Irritable bowel syndrome without diarrhea: Secondary | ICD-10-CM | POA: Diagnosis not present

## 2020-08-28 DIAGNOSIS — Z79899 Other long term (current) drug therapy: Secondary | ICD-10-CM | POA: Diagnosis not present

## 2020-08-28 DIAGNOSIS — Z7984 Long term (current) use of oral hypoglycemic drugs: Secondary | ICD-10-CM | POA: Diagnosis not present

## 2020-08-28 DIAGNOSIS — E119 Type 2 diabetes mellitus without complications: Secondary | ICD-10-CM | POA: Diagnosis not present

## 2020-08-28 DIAGNOSIS — C3411 Malignant neoplasm of upper lobe, right bronchus or lung: Secondary | ICD-10-CM | POA: Diagnosis present

## 2020-08-28 LAB — CMP (CANCER CENTER ONLY)
ALT: 14 U/L (ref 0–44)
AST: 19 U/L (ref 15–41)
Albumin: 2.5 g/dL — ABNORMAL LOW (ref 3.5–5.0)
Alkaline Phosphatase: 80 U/L (ref 38–126)
Anion gap: 7 (ref 5–15)
BUN: 6 mg/dL (ref 6–20)
CO2: 33 mmol/L — ABNORMAL HIGH (ref 22–32)
Calcium: 9.1 mg/dL (ref 8.9–10.3)
Chloride: 97 mmol/L — ABNORMAL LOW (ref 98–111)
Creatinine: 0.72 mg/dL (ref 0.61–1.24)
GFR, Estimated: >60 mL/min (ref >60)
Glucose, Bld: 101 mg/dL — ABNORMAL HIGH (ref 70–99)
Potassium: 4.1 mmol/L (ref 3.5–5.1)
Sodium: 137 mmol/L (ref 135–145)
Total Bilirubin: 0.4 mg/dL (ref 0.3–1.2)
Total Protein: 6.8 g/dL (ref 6.5–8.1)

## 2020-08-28 LAB — CBC WITH DIFFERENTIAL (CANCER CENTER ONLY)
Abs Immature Granulocytes: 0.02 10*3/uL (ref 0.00–0.07)
Basophils Absolute: 0 10*3/uL (ref 0.0–0.1)
Basophils Relative: 1 %
Eosinophils Absolute: 0 10*3/uL (ref 0.0–0.5)
Eosinophils Relative: 1 %
HCT: 33.3 % — ABNORMAL LOW (ref 39.0–52.0)
Hemoglobin: 10.5 g/dL — ABNORMAL LOW (ref 13.0–17.0)
Immature Granulocytes: 1 %
Lymphocytes Relative: 10 %
Lymphs Abs: 0.4 10*3/uL — ABNORMAL LOW (ref 0.7–4.0)
MCH: 28.5 pg (ref 26.0–34.0)
MCHC: 31.5 g/dL (ref 30.0–36.0)
MCV: 90.5 fL (ref 80.0–100.0)
Monocytes Absolute: 0.6 10*3/uL (ref 0.1–1.0)
Monocytes Relative: 14 %
Neutro Abs: 3.3 10*3/uL (ref 1.7–7.7)
Neutrophils Relative %: 73 %
Platelet Count: 240 10*3/uL (ref 150–400)
RBC: 3.68 MIL/uL — ABNORMAL LOW (ref 4.22–5.81)
RDW: 14.7 % (ref 11.5–15.5)
WBC Count: 4.4 10*3/uL (ref 4.0–10.5)
nRBC: 0 % (ref 0.0–0.2)

## 2020-08-28 NOTE — Progress Notes (Signed)
Mike [name] Telephone:([phone]) [phone]   Fax:(336) [phone]  OFFICE PROGRESS NOTE  [name], MD [place] [address]  DIAGNOSIS: Stage IV (T2b, N3, M1C) non-small cell lung cancer, squamous cell carcinoma presented with right upper lobe lung mass in addition to mediastinal and right supraclavicular lymphadenopathy as well as multiple metastatic bone lesions diagnosed in March 2021.  PRIOR THERAPY: Palliative radiotherapy to the painful metastatic bone lesions under the care of Dr. Sondra Come. Last dose 01/14/20  CURRENT THERAPY: Palliative systemic chemotherapy withcarboplatin for AUC of 5, paclitaxel 175 NG/M2 and Keytruda 200 mg IV every 3 weeks with Neulasta support.First dose 01/03/20. Status post8cycles.Starting from cycle #5 he will be on maintenance single agent Keytruda  INTERVAL HISTORY: Mike [name] 54 y.o. male returns to the clinic today for follow-up visit.  The patient is feeling a little bit better.  He continues to have swelling of the lower extremity especially in the evening time.  He had a rod placement in the left thigh because of the metastatic disease to this area.  He has been undergoing rehabilitation at home.  He missed few cycles of his treatment with Keytruda.  He denied having any current chest pain, shortness of breath, cough or hemoptysis.  He denied having any fever or chills.  He has no nausea, vomiting, diarrhea but has constipation.  He has been using Colace and Ex-Lax with mild improvement.  He denied having any headache or visual changes.  He is currently on MS Contin every 12 hour in addition to [facility] for breakthrough pain.  He is here today for evaluation before resuming his treatment.   MEDICAL HISTORY: Past Medical History:  Diagnosis Date   Arthritis    neck   Cancer, metastatic to bone ([place]) dx'd 10/2019   Diabetes ([place])    Fatty liver 12/05/2019   By [place] 11/2019   GERD (gastroesophageal  reflux disease)    Heart murmur    "heart skip" since his 20's   High cholesterol    IBS (irritable bowel syndrome)    lung ca dx'd 10/2019   lung stage 4    OSA (obstructive sleep apnea) 09/15/2019   Sleep study 12/2017 - severe OSA with AHI 65.6, desat to 76% rec CPAP   Severe obesity (BMI 35.0-39.9) with comorbidity ([place]) 09/04/2019    ALLERGIES:  has No Known Allergies.  MEDICATIONS:  Current Outpatient Medications  Medication Sig Dispense Refill   acetaminophen (TYLENOL) 325 MG tablet Take 1-2 tablets (325-650 mg total) by mouth every 6 (six) hours as needed for mild pain (pain score 1-3 or temp > 100.5).     albuterol (PROVENTIL) (2.5 MG/3ML) 0.083% nebulizer solution Take 3 mLs (2.5 mg total) by nebulization every 6 (six) hours as needed for wheezing or shortness of breath. 75 mL 12   apixaban (ELIQUIS) 5 MG TABS tablet Take 1 tablet (5 mg total) by mouth 2 (two) times daily. 60 tablet 3   atorvastatin (LIPITOR) 20 MG tablet Take 1 tablet (20 mg total) by mouth at bedtime. 90 tablet 3   diazepam (VALIUM) 5 MG tablet Take 1 tablet (5 mg total) by mouth every 12 (twelve) hours as needed for anxiety or muscle spasms. 30 tablet 0   docusate sodium (COLACE) 100 MG capsule Take 1 capsule (100 mg total) by mouth 2 (two) times daily. 10 capsule 0   furosemide (LASIX) 20 MG tablet Take 1 tablet (20 mg total) by mouth  daily. 5 tablet 0   metFORMIN (GLUCOPHAGE) 1000 MG tablet Take 1 tablet (1,000 mg total) by mouth 2 (two) times daily with a meal. 180 tablet 3   methocarbamol (ROBAXIN) 500 MG tablet Take 1 tablet (500 mg total) by mouth every 6 (six) hours as needed for muscle spasms. 60 tablet 6   morphine (MS CONTIN) 60 MG 12 hr tablet Take 1 tablet (60 mg total) by mouth every 12 (twelve) hours. 30 tablet 0   Oxycodone HCl 10 MG TABS Take 1 tablet (10 mg total) by mouth every 3 (three) hours as needed (for severe breakthrough pain). 60 tablet 0   oxyCODONE-acetaminophen  (PERCOCET) 5-325 MG tablet Take 1-2 tablets by mouth 3 (three) times daily as needed. 30 tablet 0   sildenafil (REVATIO) 20 MG tablet Take 1-5 tablets (20-100 mg total) by mouth daily as needed (ED). (Patient taking differently: Take 40 mg by mouth daily as needed (ED). ) 30 tablet 3   No current facility-administered medications for this visit.    SURGICAL HISTORY:  Past Surgical History:  Procedure Laterality Date   BIOPSY  12/18/2019   Procedure: BIOPSY;  Surgeon: Garner Nash, DO;  Location: [facility];  Service: Pulmonary;;   BRONCHIAL BRUSHINGS  12/18/2019   Procedure: BRONCHIAL BRUSHINGS;  Surgeon: Garner Nash, DO;  Location: [facility];  Service: Pulmonary;;   BRONCHIAL WASHINGS  12/18/2019   Procedure: BRONCHIAL WASHINGS;  Surgeon: Garner Nash, DO;  Location: [facility];  Service: Pulmonary;;   COLONOSCOPY  10/2019   mult TA, int hem, rpt 1 yr ([place])   CYSTECTOMY     knee- left knee cyst   ENDOBRONCHIAL ULTRASOUND  12/18/2019   Procedure: ENDOBRONCHIAL ULTRASOUND;  Surgeon: Garner Nash, DO;  Location: [facility];  Service: Pulmonary;;   FINE NEEDLE ASPIRATION  12/18/2019   Procedure: FINE NEEDLE ASPIRATION;  Surgeon: Garner Nash, DO;  Location: [facility];  Service: Pulmonary;;   INTRAMEDULLARY (IM) NAIL INTERTROCHANTERIC Left 07/25/2020   Procedure: LEFT INTERTROCHANTERIC INTRAMEDULLARY (IM) NAIL;  Surgeon: Leandrew Koyanagi, MD;  Location: [facility];  Service: Orthopedics;  Laterality: Left;   IR IMAGING GUIDED PORT INSERTION  12/27/2019   PILONIDAL CYST EXCISION     VIDEO BRONCHOSCOPY WITH ENDOBRONCHIAL ULTRASOUND Right 12/18/2019   Procedure: VIDEO BRONCHOSCOPY;  Surgeon: Garner Nash, DO;  Location: [facility];  Service: Pulmonary;  Laterality: Right;    REVIEW OF SYSTEMS:  Constitutional: positive for fatigue Eyes: negative Ears, nose, mouth, throat, and face: negative Respiratory: negative Cardiovascular:  negative Gastrointestinal: positive for constipation Genitourinary:negative Integument/breast: negative Hematologic/lymphatic: negative Musculoskeletal:positive for bone pain Neurological: negative Behavioral/Psych: negative Endocrine: negative Allergic/Immunologic: negative   PHYSICAL EXAMINATION: General appearance: alert, cooperative, fatigued and no distress Head: Normocephalic, without obvious abnormality, atraumatic Neck: no adenopathy, no JVD, supple, symmetrical, trachea midline and thyroid not enlarged, symmetric, no tenderness/mass/nodules Lymph nodes: Cervical, supraclavicular, and axillary nodes normal. Resp: clear to auscultation bilaterally Back: symmetric, no curvature. ROM normal. No CVA tenderness. Cardio: regular rate and rhythm, S1, S2 normal, no murmur, click, rub or gallop GI: soft, non-tender; bowel sounds normal; no masses,  no organomegaly Extremities: extremities normal, atraumatic, no cyanosis or edema Neurologic: Alert and oriented X 3, normal strength and tone. Normal symmetric reflexes. Normal coordination and gait  ECOG PERFORMANCE STATUS: 1 - Symptomatic but completely ambulatory  Blood pressure 125/73, pulse (!) 103, temperature 98.7 F (37.1 C), temperature source Tympanic, resp. rate 18, height 5\' 11"  (1.803 m), SpO2 96 %.  LABORATORY DATA: Lab Results  Component Value Date   WBC 4.4 08/28/2020   HGB 10.5 (L) 08/28/2020   HCT 33.3 (L) 08/28/2020   MCV 90.5 08/28/2020   PLT 240 08/28/2020      Chemistry      Component Value Date/Time   NA 135 08/18/2020 0933   K 4.6 08/18/2020 0933   CL 96 (L) 08/18/2020 0933   CO2 34 (H) 08/18/2020 0933   BUN 10 08/18/2020 0933   CREATININE 0.76 08/18/2020 0933      Component Value Date/Time   CALCIUM 9.8 08/18/2020 0933   ALKPHOS 97 08/18/2020 0933   AST 19 08/18/2020 0933   ALT 12 08/18/2020 0933   BILITOT 0.3 08/18/2020 0933       RADIOGRAPHIC STUDIES: XR HIP UNILAT W OR W/O PELVIS 2-3  VIEWS LEFT  Result Date: 08/13/2020 No hardware complications.     ASSESSMENT AND PLAN: This is a very pleasant 54 years old white male with a stage IV non-small cell lung cancer, squamous cell carcinoma presented with right upper lobe lung mass in addition to mediastinal and right supraclavicular lymphadenopathy and multiple metastatic bone lesions diagnosed in March 2021 status post palliative radiotherapy to the painful metastatic lesions under the care of Dr. Sondra Come. The patient is currently undergoing palliative systemic chemotherapy initially with carboplatin, paclitaxel and Keytruda for 4 cycles and he is currently on the maintenance phase of his treatment with single agent Keytruda status post 4 more cycles. The patient has been tolerating his treatment well with no concerning adverse effects. His treatment has been on hold because of his recent hip surgery and rehabilitation. I recommended for the patient to have repeat CT scan of the chest, abdomen pelvis performed early next week for evaluation of his disease before resuming his treatment. For pain management he will continue with MS Contin every 12 hour in addition to [facility] for breakthrough pain. For the constipation he will continue with Colace, Ex-Lax and milk of magnesia if needed. The patient will come back for follow-up visit in 1 week for evaluation before resuming his treatment with cycle #9. He was advised to call immediately if he has any concerning symptoms in the interval. The patient voices understanding of current disease status and treatment options and is in agreement with the current care plan.  All questions were answered. The patient knows to call the clinic with any problems, questions or concerns. We can certainly see the patient much sooner if necessary.  Disclaimer: This note was dictated with voice recognition software. Similar sounding words can inadvertently be transcribed and may not be corrected upon  review.

## 2020-08-29 ENCOUNTER — Ambulatory Visit: Payer: 59

## 2020-09-02 ENCOUNTER — Other Ambulatory Visit: Payer: Self-pay | Admitting: Radiation Oncology

## 2020-09-02 DIAGNOSIS — C7951 Secondary malignant neoplasm of bone: Secondary | ICD-10-CM

## 2020-09-02 MED ORDER — OXYCODONE HCL 10 MG PO TABS: 10.0000 mg | ORAL_TABLET | ORAL | 0 refills | Status: DC | PRN

## 2020-09-06 ENCOUNTER — Other Ambulatory Visit: Payer: Self-pay | Admitting: Family Medicine

## 2020-09-08 ENCOUNTER — Telehealth: Payer: Self-pay | Admitting: Family Medicine

## 2020-09-08 ENCOUNTER — Encounter (HOSPITAL_COMMUNITY): Payer: Self-pay

## 2020-09-08 ENCOUNTER — Other Ambulatory Visit: Payer: Self-pay

## 2020-09-08 ENCOUNTER — Ambulatory Visit (HOSPITAL_COMMUNITY)
Admission: RE | Admit: 2020-09-08 | Discharge: 2020-09-08 | Disposition: A | Discharge status: Home / Self Care | Payer: 59 | Source: Ambulatory Visit | Attending: Internal Medicine | Admitting: Internal Medicine

## 2020-09-08 ENCOUNTER — Other Ambulatory Visit: Payer: Self-pay | Admitting: *Deleted

## 2020-09-08 ENCOUNTER — Telehealth: Payer: Self-pay | Admitting: Medical Oncology

## 2020-09-08 DIAGNOSIS — C3490 Malignant neoplasm of unspecified part of unspecified bronchus or lung: Secondary | ICD-10-CM

## 2020-09-08 MED ORDER — IOHEXOL 300 MG/ML  SOLN
100.0000 mL | Freq: Once | INTRAMUSCULAR | Status: AC | PRN
  Administered 2020-09-08: 100 mL via INTRAVENOUS

## 2020-09-08 NOTE — Telephone Encounter (Signed)
called patient 2 times unable to reach. Not able to leave a vm its full. EM  Patient needs to schedule cpe and labs in case he calls back.

## 2020-09-08 NOTE — Patient Outreach (Signed)
[facility]) Care Management  09/08/2020  Mike [name] Oct 07, 1966 [id number]   Outgoing call placed to member, state although pain is still present at times, he is doing much better with keeping it well managed.  He has completed his radiation therapy, which he feels has helped with pain management.  He will have another CT today, after which he will have follow up with oncology (12/13 & 12/30) to discuss ongoing plan.  He also has follow up with ortho on 11/23 regarding his hip.  He has not had any structured PT but has been doing exercises on his own to gain strength.  He will discuss order for PT during ortho visit.  Denies any urgent concerns, encouraged to contact with questions.  Agrees to follow up within the next month.  Goals Addressed            This Visit's Progress   . COMPLETED: [facility] - Keep Pain Under Control       Follow Up Date 11/12   - develop a personal pain management plan - keep track of prescription refills - learn relaxation techniques - plan exercise or activity when pain is best controlled    Why is this important?   Day-to-day life can be hard when you have pain.  Even a small change in emotion or a physical problem can make pain better or worse.  Coping with pain depends on how the mind and body reacts to pain.  Pain medicine is just one piece of the treatment puzzle.  There are many tools to help manage pain. A combination of them can be used to best meet your needs.     Notes:   11/22 - completed - report pain in manageable    . [facility] - Make and Keep All Appointments   On track    Follow Up Date 12/22   - ask family or friend for a ride - call to cancel if needed - keep a calendar with appointment dates    Why is this important?   Part of staying healthy is seeing the doctor for follow-up care.  If you forget your appointments, there are some things you can do to stay on track.    Notes:  Referral to CSW for  transportation  11/22 - upcoming appointments reviewed    . [facility] - Manage Fatigue (Tiredness)   On track    Follow Up Date 12/12   - eat healthy - maintain healthy weight - use meditation or relaxation techniques    Why is this important?   Cancer treatment and its side effects can drain your energy. It can keep you from doing things you would like to do.  There are many things that you can do to manage fatigue.    Notes:         Valente David, RN, MSN [facility] [phone]

## 2020-09-08 NOTE — Telephone Encounter (Signed)
Noted.  If you don't get a response after trying in the next couple of days, plz mail a letter.

## 2020-09-08 NOTE — Telephone Encounter (Signed)
LVM that pt has lab appt at 745 and to arrive at 0730.

## 2020-09-08 NOTE — Telephone Encounter (Signed)
E-scribed refill.  Plz schedule cpe and lab visits.  

## 2020-09-09 ENCOUNTER — Other Ambulatory Visit: Payer: Self-pay

## 2020-09-09 ENCOUNTER — Telehealth: Payer: Self-pay

## 2020-09-09 ENCOUNTER — Inpatient Hospital Stay: Payer: 59

## 2020-09-09 ENCOUNTER — Inpatient Hospital Stay (HOSPITAL_BASED_OUTPATIENT_CLINIC_OR_DEPARTMENT_OTHER): Payer: 59 | Admitting: Physician Assistant

## 2020-09-09 ENCOUNTER — Telehealth: Payer: Self-pay | Admitting: Physician Assistant

## 2020-09-09 ENCOUNTER — Encounter: Payer: Self-pay | Admitting: Physician Assistant

## 2020-09-09 ENCOUNTER — Other Ambulatory Visit: Payer: Self-pay | Admitting: Radiation Oncology

## 2020-09-09 VITALS — BP 127/77 | HR 109 | Temp 98.1°F | Resp 18 | Ht 71.0 in | Wt 248.0 lb

## 2020-09-09 VITALS — HR 101

## 2020-09-09 DIAGNOSIS — C7951 Secondary malignant neoplasm of bone: Secondary | ICD-10-CM

## 2020-09-09 DIAGNOSIS — C3491 Malignant neoplasm of unspecified part of right bronchus or lung: Secondary | ICD-10-CM | POA: Diagnosis not present

## 2020-09-09 DIAGNOSIS — Z5112 Encounter for antineoplastic immunotherapy: Secondary | ICD-10-CM | POA: Diagnosis not present

## 2020-09-09 LAB — CMP (CANCER CENTER ONLY)
ALT: 12 U/L (ref 0–44)
AST: 17 U/L (ref 15–41)
Albumin: 2.6 g/dL — ABNORMAL LOW (ref 3.5–5.0)
Alkaline Phosphatase: 81 U/L (ref 38–126)
Anion gap: 15 (ref 5–15)
BUN: 6 mg/dL (ref 6–20)
CO2: 27 mmol/L (ref 22–32)
Calcium: 9.4 mg/dL (ref 8.9–10.3)
Chloride: 97 mmol/L — ABNORMAL LOW (ref 98–111)
Creatinine: 0.76 mg/dL (ref 0.61–1.24)
GFR, Estimated: >60 mL/min (ref >60)
Glucose, Bld: 127 mg/dL — ABNORMAL HIGH (ref 70–99)
Potassium: 3.8 mmol/L (ref 3.5–5.1)
Sodium: 139 mmol/L (ref 135–145)
Total Bilirubin: 0.3 mg/dL (ref 0.3–1.2)
Total Protein: 7.3 g/dL (ref 6.5–8.1)

## 2020-09-09 LAB — CBC WITH DIFFERENTIAL (CANCER CENTER ONLY)
Abs Immature Granulocytes: 0.05 10*3/uL (ref 0.00–0.07)
Basophils Absolute: 0 10*3/uL (ref 0.0–0.1)
Basophils Relative: 0 %
Eosinophils Absolute: 0 10*3/uL (ref 0.0–0.5)
Eosinophils Relative: 1 %
HCT: 34.9 % — ABNORMAL LOW (ref 39.0–52.0)
Hemoglobin: 11.1 g/dL — ABNORMAL LOW (ref 13.0–17.0)
Immature Granulocytes: 1 %
Lymphocytes Relative: 8 %
Lymphs Abs: 0.5 10*3/uL — ABNORMAL LOW (ref 0.7–4.0)
MCH: 28.7 pg (ref 26.0–34.0)
MCHC: 31.8 g/dL (ref 30.0–36.0)
MCV: 90.2 fL (ref 80.0–100.0)
Monocytes Absolute: 0.6 10*3/uL (ref 0.1–1.0)
Monocytes Relative: 10 %
Neutro Abs: 5.2 10*3/uL (ref 1.7–7.7)
Neutrophils Relative %: 80 %
Platelet Count: 227 10*3/uL (ref 150–400)
RBC: 3.87 MIL/uL — ABNORMAL LOW (ref 4.22–5.81)
RDW: 15.1 % (ref 11.5–15.5)
WBC Count: 6.4 10*3/uL (ref 4.0–10.5)
nRBC: 0 % (ref 0.0–0.2)

## 2020-09-09 LAB — TSH: TSH: 2.963 u[IU]/mL (ref 0.320–4.118)

## 2020-09-09 MED ORDER — OXYCODONE HCL 10 MG PO TABS: 10.0000 mg | ORAL_TABLET | ORAL | 0 refills | Status: DC | PRN

## 2020-09-09 MED ORDER — SODIUM CHLORIDE 0.9 % IV SOLN
200.0000 mg | Freq: Once | INTRAVENOUS | Status: AC
  Administered 2020-09-09: 200 mg via INTRAVENOUS
  Filled 2020-09-09: qty 8

## 2020-09-09 MED ORDER — HEPARIN SOD (PORK) LOCK FLUSH 100 UNIT/ML IV SOLN
500.0000 [IU] | Freq: Once | INTRAVENOUS | Status: AC | PRN
  Administered 2020-09-09: 500 [IU]
  Filled 2020-09-09: qty 5

## 2020-09-09 MED ORDER — SODIUM CHLORIDE 0.9 % IV SOLN
Freq: Once | INTRAVENOUS | Status: AC
  Filled 2020-09-09: qty 250

## 2020-09-09 MED ORDER — SODIUM CHLORIDE 0.9% FLUSH
10.0000 mL | INTRAVENOUS | Status: DC | PRN
  Administered 2020-09-09: 10 mL
  Filled 2020-09-09: qty 10

## 2020-09-09 NOTE — Telephone Encounter (Signed)
-  I reviewed the patient's finalized radiology report with Dr. Julien Nordmann. The scan showed evidence for disease progression. Dr. Julien Nordmann recommends changing his treatment to docetaxel and cyramza. I tried to call the patient to discuss the new plan but his voicemail was full and I was unable to leave a message. I left a voicemail on his emergency contacts voicemail to relay a message to have him call us back. In the meantime, I will make a follow up appointment with me or Dr. Julien Nordmann in about 2 weeks from now with the plan to start his new treatment in 3 weeks. Pending a call back.

## 2020-09-09 NOTE — Telephone Encounter (Signed)
Called the patient and discussed that we will likely need to change his treatment. We will see him back for a follow up in 2 weeks to discuss the new treatment and plan on starting in 3 weeks.

## 2020-09-09 NOTE — Progress Notes (Signed)
Per Cassie [name], PA ok to treat with HR 101.

## 2020-09-09 NOTE — Patient Instructions (Signed)
Pembrolizumab injection What is this medicine? PEMBROLIZUMAB (pem broe liz ue mab) is a monoclonal antibody. It is used to treat certain types of cancer. This medicine may be used for other purposes; ask your health care provider or pharmacist if you have questions. COMMON BRAND NAME(S): Keytruda What should I tell my health care provider before I take this medicine? They need to know if you have any of these conditions:  diabetes  immune system problems  inflammatory bowel disease  liver disease  lung or breathing disease  lupus  received or scheduled to receive an organ transplant or a stem-cell transplant that uses donor stem cells  an unusual or allergic reaction to pembrolizumab, other medicines, foods, dyes, or preservatives  pregnant or trying to get pregnant  breast-feeding How should I use this medicine? This medicine is for infusion into a vein. It is given by a health care professional in a hospital or clinic setting. A special MedGuide will be given to you before each treatment. Be sure to read this information carefully each time. Talk to your pediatrician regarding the use of this medicine in children. While this drug may be prescribed for children as young as 6 months for selected conditions, precautions do apply. Overdosage: If you think you have taken too much of this medicine contact a poison control center or emergency room at once. NOTE: This medicine is only for you. Do not share this medicine with others. What if I miss a dose? It is important not to miss your dose. Call your doctor or health care professional if you are unable to keep an appointment. What may interact with this medicine? Interactions have not been studied. Give your health care provider a list of all the medicines, herbs, non-prescription drugs, or dietary supplements you use. Also tell them if you smoke, drink alcohol, or use illegal drugs. Some items may interact with your medicine. This  list may not describe all possible interactions. Give your health care provider a list of all the medicines, herbs, non-prescription drugs, or dietary supplements you use. Also tell them if you smoke, drink alcohol, or use illegal drugs. Some items may interact with your medicine. What should I watch for while using this medicine? Your condition will be monitored carefully while you are receiving this medicine. You may need blood work done while you are taking this medicine. Do not become pregnant while taking this medicine or for 4 months after stopping it. Women should inform their doctor if they wish to become pregnant or think they might be pregnant. There is a potential for serious side effects to an unborn child. Talk to your health care professional or pharmacist for more information. Do not breast-feed an infant while taking this medicine or for 4 months after the last dose. What side effects may I notice from receiving this medicine? Side effects that you should report to your doctor or health care professional as soon as possible:  allergic reactions like skin rash, itching or hives, swelling of the face, lips, or tongue  bloody or black, tarry  breathing problems  changes in vision  chest pain  chills  confusion  constipation  cough  diarrhea  dizziness or feeling faint or lightheaded  fast or irregular heartbeat  fever  flushing  joint pain  low blood counts - this medicine may decrease the number of white blood cells, red blood cells and platelets. You may be at increased risk for infections and bleeding.  muscle pain  muscle   weakness  pain, tingling, numbness in the hands or feet  persistent headache  redness, blistering, peeling or loosening of the skin, including inside the mouth  signs and symptoms of high blood sugar such as dizziness; dry mouth; dry skin; fruity breath; nausea; stomach pain; increased hunger or thirst; increased urination  signs  and symptoms of kidney injury like trouble passing urine or change in the amount of urine  signs and symptoms of liver injury like dark urine, light-colored stools, loss of appetite, nausea, right upper belly pain, yellowing of the eyes or skin  sweating  swollen lymph nodes  weight loss Side effects that usually do not require medical attention (report to your doctor or health care professional if they continue or are bothersome):  decreased appetite  hair loss  muscle pain  tiredness This list may not describe all possible side effects. Call your doctor for medical advice about side effects. You may report side effects to FDA at 1-800-FDA-1088. Where should I keep my medicine? This drug is given in a hospital or clinic and will not be stored at home. NOTE: This sheet is a summary. It may not cover all possible information. If you have questions about this medicine, talk to your doctor, pharmacist, or health care provider.  2020 Elsevier/Gold Standard (2019-08-10 18:07:58)  

## 2020-09-09 NOTE — Telephone Encounter (Signed)
Patient's daughter called as patient is requesting a refill of his Oxycodone 10 mg. Daughter, Mamie Nick advised will give request to Dr. Sondra Come and to call back if she has any problems with picking up the rx.

## 2020-09-09 NOTE — Progress Notes (Signed)
[facility] OFFICE PROGRESS NOTE  [name], MD [place] [address]  DIAGNOSIS: Stage IV (T2b, N3, M1C) non-small cell lung cancer, squamous cell carcinoma presented with right upper lobe lung mass in addition to mediastinal and right supraclavicular lymphadenopathy as well as multiple metastatic bone lesions diagnosed in March 2021.  PRIOR THERAPY: Palliative radiotherapy to the painful metastatic bone lesions under the care of Dr. Sondra Come. Last dose 01/14/20  CURRENT THERAPY: Palliative systemic chemotherapy withcarboplatin for AUC of 5, paclitaxel 175 NG/M2 and Keytruda 200 mg IV every 3 weeks with Neulasta support.First dose 01/03/20. Status post8cycles.Starting from cycle #5 he will be on maintenance single agent Keytruda  INTERVAL HISTORY: Mike [name] 54 y.o. male returns to clinic today for a follow-up visit.  The patient has not had treatment in several weeks due to surgery of the left hip for prophylactic stabilization of an impending pathological fracture of the left intertrochanteric femur.  The patient has not started physical therapy yet but is planning on starting soon.  The patient takes MS Contin twice daily for pain control as well as oxycodone every 3 hours.  The patient is taking bowel prophylaxis as well for constipation.  The patient has some lower extremity swelling for which he was prescribed Lasix.  Otherwise, the patient denies any recent fever, chills, night sweats, or unexplained weight loss.  He states that his breathing is "pretty good".  He states that he gets some shortness of breath with exertion but "not too bad".  He denies cough, chest pain, or hemoptysis.  He denies any nausea, vomiting, or diarrhea.  He denies any headache or visual changes.  He denies any rashes or skin changes.  The patient reports that his left side of his neck feels sore.  He also notes occasional "popping sensation in his left ear" but  denies significant pain, nasal congestion, allergies, or facial pressure.  The patient recently had a restaging CT scan performed.  The patient is here today for evaluation, to review his scan results, and consideration of resuming treatment with cycle #9.  MEDICAL HISTORY: Past Medical History:  Diagnosis Date  . Arthritis    neck  . Cancer, metastatic to bone ([facility]) dx'd 10/2019  . Diabetes ([facility])   . Fatty liver 12/05/2019   By [place] 11/2019  . GERD (gastroesophageal reflux disease)   . 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 High cholesterol   . IBS (irritable bowel syndrome)   . lung ca dx'd 10/2019   lung stage 4   . OSA (obstructive sleep apnea) 09/15/2019   Sleep study 12/2017 - severe OSA with AHI 65.6, desat to 76% rec CPAP  . Severe obesity (BMI 35.0-39.9) with comorbidity ([facility]) 09/04/2019    ALLERGIES:  has No Known Allergies.  MEDICATIONS:  Current Outpatient Medications  Medication Sig Dispense Refill  . acetaminophen (TYLENOL) 325 MG tablet Take 1-2 tablets (325-650 mg total) by mouth every 6 (six) hours as needed for mild pain (pain score 1-3 or temp > 100.5).    [name] albuterol (PROVENTIL) (2.5 MG/3ML) 0.083% nebulizer solution Take 3 mLs (2.5 mg total) by nebulization every 6 (six) hours as needed for wheezing or shortness of breath. 75 mL 12  . atorvastatin (LIPITOR) 20 MG tablet Take 1 tablet (20 mg total) by mouth at bedtime. 90 tablet 3  . diazepam (VALIUM) 5 MG tablet Take 1 tablet (5 mg total) by mouth every 12 (  twelve) hours as needed for anxiety or muscle spasms. 30 tablet 0  . docusate sodium (COLACE) 100 MG capsule Take 1 capsule (100 mg total) by mouth 2 (two) times daily. 10 capsule 0  . furosemide (LASIX) 20 MG tablet Take 1 tablet (20 mg total) by mouth daily. 5 tablet 0  . metFORMIN (GLUCOPHAGE) 1000 MG tablet Take 1 tablet (1,000 mg total) by mouth 2 (two) times daily with a meal. 180 tablet 0  . methocarbamol (ROBAXIN) 500 MG tablet Take 1 tablet (500 mg  total) by mouth every 6 (six) hours as needed for muscle spasms. 60 tablet 6  . morphine (MS CONTIN) 60 MG 12 hr tablet Take 1 tablet (60 mg total) by mouth every 12 (twelve) hours. 30 tablet 0  . Oxycodone HCl 10 MG TABS Take 1 tablet (10 mg total) by mouth every 3 (three) hours as needed (for severe breakthrough pain). 60 tablet 0  . oxyCODONE-acetaminophen (PERCOCET) 5-325 MG tablet Take 1-2 tablets by mouth 3 (three) times daily as needed. 30 tablet 0  . sildenafil (REVATIO) 20 MG tablet Take 1-5 tablets (20-100 mg total) by mouth daily as needed (ED). (Patient taking differently: Take 40 mg by mouth daily as needed (ED). ) 30 tablet 3  . apixaban (ELIQUIS) 5 MG TABS tablet Take 1 tablet (5 mg total) by mouth 2 (two) times daily. (Patient not taking: Reported on 09/09/2020) 60 tablet 3   No current facility-administered medications for this visit.    SURGICAL HISTORY:  Past Surgical History:  Procedure Laterality Date  . BIOPSY  12/18/2019   Procedure: BIOPSY;  Surgeon: Garner Nash, DO;  Location: [facility];  Service: Pulmonary;;  . BRONCHIAL BRUSHINGS  12/18/2019   Procedure: BRONCHIAL BRUSHINGS;  Surgeon: Garner Nash, DO;  Location: [facility];  Service: Pulmonary;;  . BRONCHIAL WASHINGS  12/18/2019   Procedure: BRONCHIAL WASHINGS;  Surgeon: Garner Nash, DO;  Location: [facility];  Service: Pulmonary;;  . COLONOSCOPY  10/2019   mult TA, int hem, rpt 1 yr ([facility])  . CYSTECTOMY     knee- left knee cyst  . ENDOBRONCHIAL ULTRASOUND  12/18/2019   Procedure: ENDOBRONCHIAL ULTRASOUND;  Surgeon: Garner Nash, DO;  Location: [facility];  Service: Pulmonary;;  . FINE NEEDLE ASPIRATION  12/18/2019   Procedure: FINE NEEDLE ASPIRATION;  Surgeon: Garner Nash, DO;  Location: [facility];  Service: Pulmonary;;  . INTRAMEDULLARY (IM) NAIL INTERTROCHANTERIC Left 07/25/2020   Procedure: LEFT INTERTROCHANTERIC INTRAMEDULLARY (IM) NAIL;  Surgeon: Leandrew Koyanagi, MD;  Location:  [facility];  Service: Orthopedics;  Laterality: Left;  . IR IMAGING GUIDED PORT INSERTION  12/27/2019  . PILONIDAL CYST EXCISION    . VIDEO BRONCHOSCOPY WITH ENDOBRONCHIAL ULTRASOUND Right 12/18/2019   Procedure: VIDEO BRONCHOSCOPY;  Surgeon: Garner Nash, DO;  Location: [facility];  Service: Pulmonary;  Laterality: Right;    REVIEW OF SYSTEMS:   Review of Systems  Constitutional: Negative for appetite change, chills, fatigue, fever and unexpected weight change.  HENT: Positive for "popping sensation" in the left ear occasionally.  Negative for mouth sores, nosebleeds, sore throat and trouble swallowing.   Eyes: Negative for eye problems and icterus.  Respiratory: Positive for mild dyspnea on exertion.  Negative for cough, hemoptysis,  and wheezing.   Cardiovascular: Positive for lower extremity swelling.  Negative for chest pain.  Gastrointestinal: Positive for constipation.  Negative for abdominal pain, diarrhea, nausea and vomiting.  Genitourinary: Negative for bladder incontinence, difficulty urinating, dysuria, frequency  and hematuria.   Musculoskeletal: Positive for left hip pain.  Negative for back pain, gait problem, neck pain and neck stiffness.  Skin: Negative for itching and rash.  Neurological: Negative for dizziness, extremity weakness, gait problem, headaches, light-headedness and seizures.  Hematological: Negative for adenopathy. Does not bruise/bleed easily.  Psychiatric/Behavioral: Negative for confusion, depression and sleep disturbance. The patient is not nervous/anxious.     PHYSICAL EXAMINATION:  Blood pressure 127/77, pulse (!) 109, temperature 98.1 F (36.7 C), temperature source Tympanic, resp. rate 18, height 5\' 11"  (1.803 m), weight 248 lb (112.5 kg), SpO2 98 %.  ECOG PERFORMANCE STATUS: 1 - Symptomatic but completely ambulatory  Physical Exam  Constitutional: Oriented to person, place, and time and well-developed, well-nourished, and in no distress.  HENT:   Head: Normocephalic and atraumatic. Ears: External ear and EAC without erythema, lesions, or swelling. Small amount of unobstructive cerumen in right/Left EAC. Tms clear bilaterally with intact bony landmarks. No erythema, bulging, effusion.  Mouth/Throat: Oropharynx is clear and moist. No oropharyngeal exudate.  Eyes: Conjunctivae are normal. Right eye exhibits no discharge. Left eye exhibits no discharge. No scleral icterus.  Neck: Normal range of motion. Neck supple.  Cardiovascular: Normal rate, regular rhythm, normal heart sounds and intact distal pulses.   Pulmonary/Chest: Effort normal and breath sounds normal. No respiratory distress. No wheezes. No rales.  Abdominal: Soft. Bowel sounds are normal. Exhibits no distension and no mass. There is no tenderness.  Musculoskeletal: Lower extremity edema.  Normal range of motion.  Lymphadenopathy:    No cervical adenopathy.   Neurological: Alert and oriented to person, place, and time. Exhibits normal muscle tone. The patient was examined in the wheelchair.  Skin: Skin is warm and dry. No rash noted. Not diaphoretic. No erythema. No pallor.  Psychiatric: Mood, memory and judgment normal.  Vitals reviewed.  LABORATORY DATA: Lab Results  Component Value Date   WBC 6.4 09/09/2020   HGB 11.1 (L) 09/09/2020   HCT 34.9 (L) 09/09/2020   MCV 90.2 09/09/2020   PLT 227 09/09/2020      Chemistry      Component Value Date/Time   NA 139 09/09/2020 0745   K 3.8 09/09/2020 0745   CL 97 (L) 09/09/2020 0745   CO2 27 09/09/2020 0745   BUN 6 09/09/2020 0745   CREATININE 0.76 09/09/2020 0745      Component Value Date/Time   CALCIUM 9.4 09/09/2020 0745   ALKPHOS 81 09/09/2020 0745   AST 17 09/09/2020 0745   ALT 12 09/09/2020 0745   BILITOT 0.3 09/09/2020 0745       RADIOGRAPHIC STUDIES:  CT Chest W Contrast  Result Date: 09/09/2020 CLINICAL DATA:  Primary Cancer Type: Lung Imaging Indication: Assess response to therapy Interval therapy  since last imaging? Yes Initial Cancer Diagnosis Date: 12/18/2019; Established by: Biopsy-proven Detailed Pathology: Stage IV non-small cell lung cancer, squamous cell carcinoma. Primary Tumor location: Right upper lobe. Skeletal metastases to sternum, right eighth rib, left femoral neck, left ilium, right acetabulum, right side S1. Surgeries: No thoracic. Prophylactic stabilization of impending pathologic fracture of left intertrochanteric femur 07/25/2020. Chemotherapy: Yes; Ongoing? No; Most recent administration: 04/02/2020 Immunotherapy?  Yes; Type: Keytruda; Ongoing? Yes Radiation therapy? Yes Date Range: 08/15/2020 - 08/28/2020; Target: Left pelvis Date Range: 07/07/2020 - 07/16/2020; Target: Right lung, right ribs, right pelvis Date Range: 01/01/2020 - 01/14/2020; Target: Right chest, right ribs, right pelvis EXAM: CT CHEST, ABDOMEN, AND PELVIS WITH CONTRAST TECHNIQUE: Multidetector CT imaging of the chest, abdomen and  pelvis was performed following the standard protocol during bolus administration of intravenous contrast. CONTRAST:  129mL OMNIPAQUE IOHEXOL 300 MG/ML  SOLN COMPARISON:  Multiple exams, including CT chest, abdomen and pelvis 06/06/2020 and 12/20/2019 PET-CT. FINDINGS: CT CHEST FINDINGS Cardiovascular: Coronary, aortic arch, and branch vessel atherosclerotic vascular disease. Stable appearance of web like peripheral filling defect best seen in the left lower lobe pulmonary artery on image 29 of series 2 compatible with remote/chronic pulmonary embolus. Mediastinum/Nodes: Left supraclavicular node 1.6 cm in short axis on image 9 series 2, previously 0.6 cm. AP window lymph node 1.2 cm in short axis on image 22 series 2, previously 0.5 cm. Right hilar node 1.8 cm in short axis on image 27 series 2, previously the same. Right upper paratracheal/paraesophageal lymph node 1.2 cm in short axis on image 10 series 2, previously 1.3 cm. Stable subcarinal lymph node. Lungs/Pleura: Right upper lobe mass  3.6 by 2.1 cm on image 54 of series 6, previously 3.0 by 1.2 cm by my measurements. Right lower lobe sub solid peribronchovascular nodule 1.1 cm in diameter on image 88 of series 6, new compared to previous. Airway thickening is present, suggesting bronchitis or reactive airways disease. New trace right pleural effusion. Musculoskeletal: Progressive prominent lytic lesion of the left medial clavicle. Small lytic lesion of the left anterior humeral head on image 8 of series 6, new compared to previous. Worsening lytic component of the lytic and sclerotic sternal lesion, lytic component currently 2.6 by 1.6 cm on image 134 of series 5, and previously 1.0 by 0.6 cm. Stable sclerotic lesion of the right lateral eighth rib. A large lytic lesion of the right medial eleventh rib partially scallops the T11 vertebral body, and measures 5.2 by 2.9 cm on image 53 of series 2, previously 1.4 by 1.0 cm. CT ABDOMEN PELVIS FINDINGS Hepatobiliary: Newly appreciable hypoenhancing mass measuring 3.0 by 2.1 cm in the right hepatic lobe on image 65 of series 2. Scalloped inferior contour of the right hepatic lobe adjacent to a 2.3 by 1.0 cm inferior right hepatic lobe subcapsular lesion on image 80 of series 4. Gallbladder unremarkable. No biliary dilatation. Pancreas: Unremarkable Spleen: Unremarkable Adrenals/Urinary Tract: Unremarkable Stomach/Bowel: Indistinct density at the anorectal junction is nonspecific and probably incidental for example on image 131 of series 2, correlate with any rectal symptoms. There is no specific abnormality in this vicinity on prior CT. Vascular/Lymphatic: Small but increased periaortic lymph nodes are present including a 0.7 cm left periaortic node on image 75 of series 2, previously 0.3 cm. Right external iliac node enlarged at 1.4 cm in short axis on image 108 of series 2, previously 0.3 cm. Aortoiliac atherosclerotic vascular disease. Reproductive: Unremarkable Other: Mild presacral edema.  Musculoskeletal: New and progressive osseous metastatic lesions of the right upper sacrum, the left upper iliac bone, the right anterior iliac bone, the left lower sacrum, the posterior wall the right acetabulum, and the left posterior femoral neck and greater trochanter. Since the prior CT there is been left hip ORIF. IMPRESSION: 1. Substantial worsening/progression including the thoracic adenopathy, right upper lobe mass, widespread scattered lytic metastatic disease, and hepatic metastatic disease as detailed above. 2. Stable appearance of chronic pulmonary embolus best seen in the left lower lobe pulmonary artery. 3. Airway thickening is present, suggesting bronchitis or reactive airways disease. 4. New trace right pleural effusion. 5. Indistinct density at the anorectal junction, probably incidental, less likely due to an underlying mass. 6.  Aortic Atherosclerosis (ICD10-I70.0). Electronically Signed   By:  Van Clines M.D.   On: 09/09/2020 10:27   CT Abdomen Pelvis W Contrast  Result Date: 09/09/2020 CLINICAL DATA:  Primary Cancer Type: Lung Imaging Indication: Assess response to therapy Interval therapy since last imaging? Yes Initial Cancer Diagnosis Date: 12/18/2019; Established by: Biopsy-proven Detailed Pathology: Stage IV non-small cell lung cancer, squamous cell carcinoma. Primary Tumor location: Right upper lobe. Skeletal metastases to sternum, right eighth rib, left femoral neck, left ilium, right acetabulum, right side S1. Surgeries: No thoracic. Prophylactic stabilization of impending pathologic fracture of left intertrochanteric femur 07/25/2020. Chemotherapy: Yes; Ongoing? No; Most recent administration: 04/02/2020 Immunotherapy?  Yes; Type: Keytruda; Ongoing? Yes Radiation therapy? Yes Date Range: 08/15/2020 - 08/28/2020; Target: Left pelvis Date Range: 07/07/2020 - 07/16/2020; Target: Right lung, right ribs, right pelvis Date Range: 01/01/2020 - 01/14/2020; Target: Right chest, right  ribs, right pelvis EXAM: CT CHEST, ABDOMEN, AND PELVIS WITH CONTRAST TECHNIQUE: Multidetector CT imaging of the chest, abdomen and pelvis was performed following the standard protocol during bolus administration of intravenous contrast. CONTRAST:  153mL OMNIPAQUE IOHEXOL 300 MG/ML  SOLN COMPARISON:  Multiple exams, including CT chest, abdomen and pelvis 06/06/2020 and 12/20/2019 PET-CT. FINDINGS: CT CHEST FINDINGS Cardiovascular: Coronary, aortic arch, and branch vessel atherosclerotic vascular disease. Stable appearance of web like peripheral filling defect best seen in the left lower lobe pulmonary artery on image 29 of series 2 compatible with remote/chronic pulmonary embolus. Mediastinum/Nodes: Left supraclavicular node 1.6 cm in short axis on image 9 series 2, previously 0.6 cm. AP window lymph node 1.2 cm in short axis on image 22 series 2, previously 0.5 cm. Right hilar node 1.8 cm in short axis on image 27 series 2, previously the same. Right upper paratracheal/paraesophageal lymph node 1.2 cm in short axis on image 10 series 2, previously 1.3 cm. Stable subcarinal lymph node. Lungs/Pleura: Right upper lobe mass 3.6 by 2.1 cm on image 54 of series 6, previously 3.0 by 1.2 cm by my measurements. Right lower lobe sub solid peribronchovascular nodule 1.1 cm in diameter on image 88 of series 6, new compared to previous. Airway thickening is present, suggesting bronchitis or reactive airways disease. New trace right pleural effusion. Musculoskeletal: Progressive prominent lytic lesion of the left medial clavicle. Small lytic lesion of the left anterior humeral head on image 8 of series 6, new compared to previous. Worsening lytic component of the lytic and sclerotic sternal lesion, lytic component currently 2.6 by 1.6 cm on image 134 of series 5, and previously 1.0 by 0.6 cm. Stable sclerotic lesion of the right lateral eighth rib. A large lytic lesion of the right medial eleventh rib partially scallops the T11  vertebral body, and measures 5.2 by 2.9 cm on image 53 of series 2, previously 1.4 by 1.0 cm. CT ABDOMEN PELVIS FINDINGS Hepatobiliary: Newly appreciable hypoenhancing mass measuring 3.0 by 2.1 cm in the right hepatic lobe on image 65 of series 2. Scalloped inferior contour of the right hepatic lobe adjacent to a 2.3 by 1.0 cm inferior right hepatic lobe subcapsular lesion on image 80 of series 4. Gallbladder unremarkable. No biliary dilatation. Pancreas: Unremarkable Spleen: Unremarkable Adrenals/Urinary Tract: Unremarkable Stomach/Bowel: Indistinct density at the anorectal junction is nonspecific and probably incidental for example on image 131 of series 2, correlate with any rectal symptoms. There is no specific abnormality in this vicinity on prior CT. Vascular/Lymphatic: Small but increased periaortic lymph nodes are present including a 0.7 cm left periaortic node on image 75 of series 2, previously 0.3 cm. Right external  iliac node enlarged at 1.4 cm in short axis on image 108 of series 2, previously 0.3 cm. Aortoiliac atherosclerotic vascular disease. Reproductive: Unremarkable Other: Mild presacral edema. Musculoskeletal: New and progressive osseous metastatic lesions of the right upper sacrum, the left upper iliac bone, the right anterior iliac bone, the left lower sacrum, the posterior wall the right acetabulum, and the left posterior femoral neck and greater trochanter. Since the prior CT there is been left hip ORIF. IMPRESSION: 1. Substantial worsening/progression including the thoracic adenopathy, right upper lobe mass, widespread scattered lytic metastatic disease, and hepatic metastatic disease as detailed above. 2. Stable appearance of chronic pulmonary embolus best seen in the left lower lobe pulmonary artery. 3. Airway thickening is present, suggesting bronchitis or reactive airways disease. 4. New trace right pleural effusion. 5. Indistinct density at the anorectal junction, probably incidental,  less likely due to an underlying mass. 6.  Aortic Atherosclerosis (ICD10-I70.0). Electronically Signed   By: Van Clines M.D.   On: 09/09/2020 10:27   XR HIP UNILAT W OR W/O PELVIS 2-3 VIEWS LEFT  Result Date: 08/13/2020 No hardware complications.      ASSESSMENT/PLAN:  This is a very pleasant 69 year oldCaucasianmale recently diagnosed with a stage IV (T2b, N3, M1C) non-small cell lung cancer, squamous cell carcinoma presented with right upper lobe lung mass in addition to mediastinal and right supraclavicular lymphadenopathy as well as multiple metastatic bone lesions diagnosed in March 2021.  The patientcompletedpalliative radiotherapy to the painful bone lesions under the care of Dr.Kinard.Last treatment on 01/14/20.  The patient had a stabilization of the impending pathological fracture of the left hip in October 2021.   The patient is currently undergoing palliative systemic chemotherapy with carboplatin for an AUC of 5, paclitaxel 175mg /m, and Keytruda 200 mg IV every 3 weeks with Neulasta support. He is status post8cycles.Starting from cycle #5 he will be on maintenance single agent Keytruda.  The patient has been off treatment since 07/01/2020 due to his recent surgery.  The patient recently had a restaging CT scan performed.  The final radiologist report is not available at this time but Dr. Julien Nordmann personally and independently reviewed the scan and discussed the results with the patient.  There appears to be some enlargement in some of the lymph nodes in the chest as well as slight increase of the primary lung mass.  Dr. Julien Nordmann discussed that this may be due to the fact that the patient has been off treatment for several weeks versus the treatment is not working.  Dr. Julien Nordmann recommends the patient proceed with cycle #9 today as scheduled with close monitoring and a repeat CT scan after 3 more cycles; however, if the patient's final radiology report shows evidence  of disease progression in other areas then Dr. Julien Nordmann discussed that me may have to switch treatment to chemotherapy.   We will call the patient once the final results of his scan are available.  For now, we will plan on seeing the patient back for follow-up visit in 3 weeks for evaluation before starting cycle #10.  The patient reports an occasional "popping sensation in his left ear.  No evidence of erythema, bulging, or opacity in the left ear. The patient has some soreness in the left supraclavicular area. No adenopathy was palpated. No lymphadenopathy was seen on the recent scan; however, we will wait for the final report.   The patient was advised to call immediately if he has any concerning symptoms in the interval. The patient  voices understanding of current disease status and treatment options and is in agreement with the current care plan. All questions were answered. The patient knows to call the clinic with any problems, questions or concerns. We can certainly see the patient much sooner if necessary   No orders of the defined types were placed in this encounter.    Cyler Kappes L Genoveva Singleton, PA-C 09/09/20  ADDENDUM: Hematology/Oncology Attending: I had a face-to-face encounter with the patient today.  I recommended his care plan.  This is a very pleasant 54 years old white male with stage IV non-small cell lung cancer, squamous cell carcinoma status post 4 cycles of induction systemic chemotherapy with carboplatin, paclitaxel and Keytruda followed by 4 more cycles of maintenance treatment with single agent Keytruda.  The patient has been off treatment for more than 2 months because of recent hip surgery and rehabilitation.  He is here today for evaluation before resuming his treatment.  He had CT scan of the chest, abdomen pelvis performed yesterday but unfortunately the report was not available at the time with those The patient.  I personally and independently reviewed the images  and noticed some evidence for disease progression. I recommended for the patient to proceed with his treatment today as planned. Based on the finding on the final report of the scan we may consider changing his treatment to second line systemic chemotherapy likely with docetaxel and Cyramza starting in 3 weeks. The patient will come back for follow-up visit at that time. He was advised to call immediately if he has any other concerning symptoms in the interval.  Disclaimer: This note was dictated with voice recognition software. Similar sounding words can inadvertently be transcribed and may be missed upon review. Eilleen Kempf, MD 09/09/20

## 2020-09-10 ENCOUNTER — Ambulatory Visit: Payer: 59 | Admitting: Orthopaedic Surgery

## 2020-09-12 ENCOUNTER — Telehealth: Payer: Self-pay | Admitting: Physician Assistant

## 2020-09-12 NOTE — Telephone Encounter (Signed)
Added flush to appts per los. Called and left msg about slight time changes

## 2020-09-15 ENCOUNTER — Telehealth: Payer: Self-pay | Admitting: *Deleted

## 2020-09-15 NOTE — Telephone Encounter (Signed)
Patient called to question If he could get refill of his morphine and oxycodone.  He is out of Morphine and will be come Wednesday for his Oxycodone.  Routed to Dr. Sondra Come to review.

## 2020-09-16 ENCOUNTER — Other Ambulatory Visit: Payer: Self-pay | Admitting: Radiation Oncology

## 2020-09-16 MED ORDER — MORPHINE SULFATE ER 60 MG PO TBCR: 60.0000 mg | EXTENDED_RELEASE_TABLET | Freq: Two times a day (BID) | ORAL | 0 refills | Status: DC

## 2020-09-17 ENCOUNTER — Other Ambulatory Visit: Payer: Self-pay | Admitting: Radiation Oncology

## 2020-09-17 DIAGNOSIS — C7951 Secondary malignant neoplasm of bone: Secondary | ICD-10-CM

## 2020-09-17 MED ORDER — OXYCODONE HCL 10 MG PO TABS: 10.0000 mg | ORAL_TABLET | ORAL | 0 refills | Status: DC | PRN

## 2020-09-18 ENCOUNTER — Other Ambulatory Visit: Payer: Self-pay | Admitting: Family Medicine

## 2020-09-18 NOTE — Telephone Encounter (Signed)
Eliquis Last filled:  08/25/20, #60 Last OV:  02/22/20, 3 mo DM f/u Next OV:  12/12/20, CPE

## 2020-09-19 ENCOUNTER — Other Ambulatory Visit: Payer: Self-pay

## 2020-09-19 ENCOUNTER — Ambulatory Visit (INDEPENDENT_AMBULATORY_CARE_PROVIDER_SITE_OTHER): Payer: 59 | Admitting: Orthopaedic Surgery

## 2020-09-19 ENCOUNTER — Ambulatory Visit (INDEPENDENT_AMBULATORY_CARE_PROVIDER_SITE_OTHER): Payer: 59

## 2020-09-19 ENCOUNTER — Encounter: Payer: Self-pay | Admitting: Orthopaedic Surgery

## 2020-09-19 VITALS — Ht 71.0 in | Wt 248.0 lb

## 2020-09-19 DIAGNOSIS — M25552 Pain in left hip: Secondary | ICD-10-CM

## 2020-09-19 DIAGNOSIS — Z9189 Other specified personal risk factors, not elsewhere classified: Secondary | ICD-10-CM | POA: Diagnosis not present

## 2020-09-19 NOTE — Progress Notes (Signed)
Post-Op Visit Note   Patient: Mike [name]           Date of Birth: 11/09/1965           MRN: [id number] Visit Date: 09/19/2020 PCP: [name], MD   Assessment & Plan:  Chief Complaint:  Chief Complaint  Patient presents with  . Left Hip - Follow-up   Visit Diagnoses:  1. Impending pathologic fracture   2. Pain in left hip     Plan: Patient is a pleasant 54 year old gentleman who comes in today approximately 8 weeks out left hip IM nail from an impending pathologic femur fracture.  He has been doing well.  He is ambulating with a walker.  He notes that he has finished his radiation treatment to the left hip and is now beginning chemotherapy.  He has minimal pain.  He does express interest in going to a few outpatient physical therapy sessions.  Examination of his left hip reveals fully healed surgical scars without complication.  He is able to fully hip flex.  No pain with logroll.  At this point, he will continue to advance with activity as tolerated.  I have sent in an internal referral for physical therapy.  He will follow up with [place] should he have return of pain.  Call with concerns or questions in the meantime.  Follow-Up Instructions: Return if symptoms worsen or fail to improve.   Orders:  Orders Placed This Encounter  Procedures  . XR HIP UNILAT W OR W/O PELVIS 2-3 VIEWS LEFT  . Ambulatory referral to Physical Therapy   No orders of the defined types were placed in this encounter.   Imaging: XR HIP UNILAT W OR W/O PELVIS 2-3 VIEWS LEFT  Result Date: 09/19/2020 Stable alignment of the hardware without interval change   PMFS History: Patient Active Problem List   Diagnosis Date Noted  . Femoral fracture ([place]) 07/24/2020  . Impending pathologic fracture 07/22/2020  . Situational depression 02/22/2020  . UTI due to extended-spectrum beta lactamase (ESBL) producing Escherichia coli 02/10/2020  . Abnormal thyroid function test 02/10/2020  . Port-A-Cath  in place 01/29/2020  . Acute pulmonary embolism ([place]) 01/20/2020  . Stage IV squamous cell carcinoma of right lung ([place]) 12/24/2019  . Goals of care, counseling/discussion 12/24/2019  . Encounter for antineoplastic chemotherapy 12/24/2019  . Encounter for antineoplastic immunotherapy 12/24/2019  . Rib pain on right side 12/12/2019  . Bony metastasis ([place]) 12/12/2019  . Fatty liver 12/05/2019  . Encounter for well adult exam with abnormal findings 11/25/2019  . Chest pain 11/25/2019  . GERD (gastroesophageal reflux disease) 11/25/2019  . Dyslipidemia associated with type 2 diabetes mellitus ([place]) 11/25/2019  . Ex-smoker 09/15/2019  . OSA (obstructive sleep apnea) 09/15/2019  . Severe obesity (BMI 35.0-39.9) with comorbidity ([place]) 09/04/2019  . Erectile dysfunction 10/18/2014  . Type 2 diabetes mellitus with other specified complication ([place]) 87/56/4332   Past Medical History:  Diagnosis Date  . Arthritis    neck  . Cancer, metastatic to bone ([place]) dx'd 10/2019  . Diabetes ([place])   . Fatty liver 12/05/2019   By [place] 11/2019  . GERD (gastroesophageal reflux disease)   . 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 High cholesterol   . IBS (irritable bowel syndrome)   . lung ca dx'd 10/2019   lung stage 4   . OSA (obstructive sleep apnea) 09/15/2019   Sleep study 12/2017 - severe OSA with AHI 65.6, desat to  76% rec CPAP  . Severe obesity (BMI 35.0-39.9) with comorbidity ([place]) 09/04/2019    Family History  Problem Relation Age of Onset  . CAD Mother        stents  . Stroke Neg Hx   . Diabetes Neg Hx   . Cancer Neg Hx   . Colon cancer Neg Hx   . Esophageal cancer Neg Hx   . Stomach cancer Neg Hx   . Rectal cancer Neg Hx     Past Surgical History:  Procedure Laterality Date  . BIOPSY  12/18/2019   Procedure: BIOPSY;  Surgeon: Garner Nash, DO;  Location: [facility];  Service: Pulmonary;;  . BRONCHIAL BRUSHINGS  12/18/2019   Procedure: BRONCHIAL BRUSHINGS;  Surgeon: Garner Nash, DO;  Location: [facility];  Service: Pulmonary;;  . BRONCHIAL WASHINGS  12/18/2019   Procedure: BRONCHIAL WASHINGS;  Surgeon: Garner Nash, DO;  Location: [facility];  Service: Pulmonary;;  . COLONOSCOPY  10/2019   mult TA, int hem, rpt 1 yr ([place])  . CYSTECTOMY     knee- left knee cyst  . ENDOBRONCHIAL ULTRASOUND  12/18/2019   Procedure: ENDOBRONCHIAL ULTRASOUND;  Surgeon: Garner Nash, DO;  Location: [facility];  Service: Pulmonary;;  . FINE NEEDLE ASPIRATION  12/18/2019   Procedure: FINE NEEDLE ASPIRATION;  Surgeon: Garner Nash, DO;  Location: [facility];  Service: Pulmonary;;  . INTRAMEDULLARY (IM) NAIL INTERTROCHANTERIC Left 07/25/2020   Procedure: LEFT INTERTROCHANTERIC INTRAMEDULLARY (IM) NAIL;  Surgeon: Leandrew Koyanagi, MD;  Location: [facility];  Service: Orthopedics;  Laterality: Left;  . IR IMAGING GUIDED PORT INSERTION  12/27/2019  . PILONIDAL CYST EXCISION    . VIDEO BRONCHOSCOPY WITH ENDOBRONCHIAL ULTRASOUND Right 12/18/2019   Procedure: VIDEO BRONCHOSCOPY;  Surgeon: Garner Nash, DO;  Location: [facility];  Service: Pulmonary;  Laterality: Right;   Social History   Occupational History  . Occupation: Applying for Disability  Tobacco Use  . Smoking status: Current Every Day Smoker    Packs/day: 3.00    Years: 40.00    Pack years: 120.00    Last attempt to quit: 01/18/2020    Years since quitting: 0.6  . Smokeless tobacco: Never Used  . Tobacco comment: Currently smoking about 5 cigs a day  Vaping Use  . Vaping Use: Never used  Substance and Sexual Activity  . Alcohol use: Yes    Comment: Rarely  . Drug use: Never  . Sexual activity: Yes    Partners: Female

## 2020-09-23 NOTE — Progress Notes (Signed)
[facility] OFFICE PROGRESS NOTE  [name], MD [place] [address]  DIAGNOSIS:Stage IV (T2b, N3, [facility]) non-small cell lung cancer, squamous cell carcinoma presented with right upper lobe lung mass in addition to mediastinal and right supraclavicular lymphadenopathy as well as multiple metastatic bone lesions diagnosed in March 2021.  PRIOR THERAPY:  1) Palliative radiotherapy to the painful metastatic bone lesions under the care of Dr. Sondra Come. Last dose 01/14/20 2) Palliative systemic chemotherapy withcarboplatin for AUC of 5, paclitaxel 175 NG/M2 and Keytruda 200 mg IV every 3 weeks with Neulasta support.Last dose on 09/09/20. Status post9cycles.Starting from cycle #5 he will be on maintenance single agent Keytruda  CURRENT THERAPY: Palliative systemic chemotherapy with docetaxel 75 mg per metered squared and Cyramza 10 mg/kg IV every 3 weeks with Neulasta support.  First dose expected on 10/01/20.   INTERVAL HISTORY: Mike [name] 54 y.o. male returns to the clinic today for a follow-up visit accompanied by his wife.  The patient had been off treatment for several weeks due to surgery for left hip surgery for prophylactic stabilization of an impending pathological fracture in the left intertrochanteric femur.  He is currently taking MS Contin twice daily for pain control and oxycodone every 3 hours. The patient is currently undergoing physical therapy. The patient had a restaging CT scan on 09/08/20 which, unfortunately, showed evidence of disease progression with substantial worsening/progression including the thoracic adenopathy, right upper lobe mass, and widespread scattered lytic osseous lesions and progressive hepatic metastatic disease.   Otherwise, the patient denies any recent fever, chills, or night sweats. It appears that the patient lost 8 lbs since his last appointment. He reports increased chest congestion. He notes localized pain  in his left ribs/left shoulder/left neck. He has a follow up appointment with Dr. Sondra Come on 09/29/20.  The patient is here today for more detailed discussion of his recent scan as well as for more detailed discussion about his current condition and recommended treatment options.  MEDICAL HISTORY: Past Medical History:  Diagnosis Date  . Arthritis    neck  . Cancer, metastatic to bone ([place]) dx'd 10/2019  . Diabetes ([place])   . Fatty liver 12/05/2019   By [place] 11/2019  . GERD (gastroesophageal reflux disease)   . 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 High cholesterol   . IBS (irritable bowel syndrome)   . lung ca dx'd 10/2019   lung stage 4   . OSA (obstructive sleep apnea) 09/15/2019   Sleep study 12/2017 - severe OSA with AHI 65.6, desat to 76% rec CPAP  . Severe obesity (BMI 35.0-39.9) with comorbidity ([place]) 09/04/2019    ALLERGIES:  has No Known Allergies.  MEDICATIONS:  Current Outpatient Medications  Medication Sig Dispense Refill  . acetaminophen (TYLENOL) 325 MG tablet Take 1-2 tablets (325-650 mg total) by mouth every 6 (six) hours as needed for mild pain (pain score 1-3 or temp > 100.5).    [name] albuterol (PROVENTIL) (2.5 MG/3ML) 0.083% nebulizer solution Take 3 mLs (2.5 mg total) by nebulization every 6 (six) hours as needed for wheezing or shortness of breath. 75 mL 12  . atorvastatin (LIPITOR) 20 MG tablet Take 1 tablet (20 mg total) by mouth at bedtime. 90 tablet 3  . diazepam (VALIUM) 5 MG tablet Take 1 tablet (5 mg total) by mouth every 12 (twelve) hours as needed for anxiety or muscle spasms. 30 tablet 0  . docusate sodium (COLACE)  100 MG capsule Take 1 capsule (100 mg total) by mouth 2 (two) times daily. 10 capsule 0  . ELIQUIS 5 MG TABS tablet TAKE 1 TABLET BY MOUTH TWICE A DAY 60 tablet 3  . furosemide (LASIX) 20 MG tablet Take 1 tablet (20 mg total) by mouth daily. 5 tablet 0  . metFORMIN (GLUCOPHAGE) 1000 MG tablet Take 1 tablet (1,000 mg total) by mouth 2 (two)  times daily with a meal. 180 tablet 0  . methocarbamol (ROBAXIN) 500 MG tablet Take 1 tablet (500 mg total) by mouth every 6 (six) hours as needed for muscle spasms. 60 tablet 6  . Oxycodone HCl 10 MG TABS Take 1 tablet (10 mg total) by mouth every 3 (three) hours as needed (for severe breakthrough pain). 60 tablet 0  . sildenafil (REVATIO) 20 MG tablet Take 1-5 tablets (20-100 mg total) by mouth daily as needed (ED). (Patient taking differently: Take 40 mg by mouth daily as needed (ED).) 30 tablet 3  . dexamethasone (DECADRON) 4 MG tablet Take 2 tablets TWICE a day the day before, the day of, and the day after chemotherapy. 60 tablet 2  . fentaNYL (DURAGESIC) 50 MCG/HR Place 1 patch onto the skin every 3 (three) days. 5 patch 0  . oxyCODONE-acetaminophen (PERCOCET) 5-325 MG tablet Take 1-2 tablets by mouth every 6 (six) hours as needed for moderate pain. 40 tablet 0   No current facility-administered medications for this visit.    SURGICAL HISTORY:  Past Surgical History:  Procedure Laterality Date  . BIOPSY  12/18/2019   Procedure: BIOPSY;  Surgeon: Garner Nash, DO;  Location: [facility];  Service: Pulmonary;;  . BRONCHIAL BRUSHINGS  12/18/2019   Procedure: BRONCHIAL BRUSHINGS;  Surgeon: Garner Nash, DO;  Location: [facility];  Service: Pulmonary;;  . BRONCHIAL WASHINGS  12/18/2019   Procedure: BRONCHIAL WASHINGS;  Surgeon: Garner Nash, DO;  Location: [facility];  Service: Pulmonary;;  . COLONOSCOPY  10/2019   mult TA, int hem, rpt 1 yr ([place])  . CYSTECTOMY     knee- left knee cyst  . ENDOBRONCHIAL ULTRASOUND  12/18/2019   Procedure: ENDOBRONCHIAL ULTRASOUND;  Surgeon: Garner Nash, DO;  Location: [facility];  Service: Pulmonary;;  . FINE NEEDLE ASPIRATION  12/18/2019   Procedure: FINE NEEDLE ASPIRATION;  Surgeon: Garner Nash, DO;  Location: [facility];  Service: Pulmonary;;  . INTRAMEDULLARY (IM) NAIL INTERTROCHANTERIC Left 07/25/2020   Procedure: LEFT  INTERTROCHANTERIC INTRAMEDULLARY (IM) NAIL;  Surgeon: Leandrew Koyanagi, MD;  Location: [facility];  Service: Orthopedics;  Laterality: Left;  . IR IMAGING GUIDED PORT INSERTION  12/27/2019  . PILONIDAL CYST EXCISION    . VIDEO BRONCHOSCOPY WITH ENDOBRONCHIAL ULTRASOUND Right 12/18/2019   Procedure: VIDEO BRONCHOSCOPY;  Surgeon: Garner Nash, DO;  Location: [facility];  Service: Pulmonary;  Laterality: Right;    REVIEW OF SYSTEMS:   Review of Systems  Constitutional: Positive for weight loss and fatigue. Negative for appetite change, chills, and fever.  HENT: Negative for mouth sores, nosebleeds, sore throat and trouble swallowing.   Eyes: Negative for eye problems and icterus.  Respiratory: Positive for shortness of breath and cough. Negative for hemoptysis and wheezing.   Cardiovascular: Positive for left rib pain. Negative for leg swelling.  Gastrointestinal: Negative for abdominal pain, constipation, diarrhea, nausea and vomiting.  Genitourinary: Negative for bladder incontinence, difficulty urinating, dysuria, frequency and hematuria.   Musculoskeletal: Positive for diffuse bone pain particularly in hip, left neck/left shoulder, and left chest.  Negative for neck stiffness.  Skin: Negative for itching and rash.  Neurological: Negative for dizziness, extremity weakness, gait problem, headaches, light-headedness and seizures.  Hematological: Negative for adenopathy. Does not bruise/bleed easily.  Psychiatric/Behavioral: Negative for confusion, depression and sleep disturbance. The patient is not nervous/anxious.     PHYSICAL EXAMINATION:  Blood pressure 128/77, pulse (!) 106, temperature 98.4 F (36.9 C), temperature source Tympanic, resp. rate 18, height 5\' 11"  (1.803 m), weight 240 lb 14.4 oz (109.3 kg), SpO2 98 %.  ECOG PERFORMANCE STATUS: 2 - Symptomatic, <50% confined to bed  Physical Exam  Constitutional: Oriented to person, place, and time and well-developed, well-nourished, and in  no distress.  HENT:  Head: Normocephalic and atraumatic.  Eyes: Conjunctivae are normal. Right eye exhibits no discharge. Left eye exhibits no discharge. No scleral icterus.  Neck: Normal range of motion. Neck supple.  Cardiovascular: Normal rate, regular rhythm, normal heart sounds and intact distal pulses.   Pulmonary/Chest: Effort normal and breath sounds normal. No respiratory distress. No wheezes. No rales.  Abdominal: Soft. Bowel sounds are normal. Exhibits no distension and no mass. There is no tenderness.  Musculoskeletal: Lower extremity edema.  Normal range of motion.  Lymphadenopathy:    No cervical adenopathy.   Neurological: Alert and oriented to person, place, and time. Exhibits normal muscle tone. The patient was examined in the wheelchair.  Skin: Skin is warm and dry. No rash noted. Not diaphoretic. No erythema. No pallor.  Psychiatric: Mood, memory and judgment normal.  Vitals reviewed.  LABORATORY DATA: Lab Results  Component Value Date   WBC 4.7 09/25/2020   HGB 10.5 (L) 09/25/2020   HCT 33.2 (L) 09/25/2020   MCV 89.7 09/25/2020   PLT 274 09/25/2020      Chemistry      Component Value Date/Time   NA 136 09/25/2020 1414   K 4.6 09/25/2020 1414   CL 95 (L) 09/25/2020 1414   CO2 29 09/25/2020 1414   BUN 6 09/25/2020 1414   CREATININE 0.72 09/25/2020 1414      Component Value Date/Time   CALCIUM 9.9 09/25/2020 1414   ALKPHOS 91 09/25/2020 1414   AST 27 09/25/2020 1414   ALT 19 09/25/2020 1414   BILITOT 0.3 09/25/2020 1414       RADIOGRAPHIC STUDIES:  CT Chest W Contrast  Result Date: 09/09/2020 CLINICAL DATA:  Primary Cancer Type: Lung Imaging Indication: Assess response to therapy Interval therapy since last imaging? Yes Initial Cancer Diagnosis Date: 12/18/2019; Established by: Biopsy-proven Detailed Pathology: Stage IV non-small cell lung cancer, squamous cell carcinoma. Primary Tumor location: Right upper lobe. Skeletal metastases to sternum,  right eighth rib, left femoral neck, left ilium, right acetabulum, right side S1. Surgeries: No thoracic. Prophylactic stabilization of impending pathologic fracture of left intertrochanteric femur 07/25/2020. Chemotherapy: Yes; Ongoing? No; Most recent administration: 04/02/2020 Immunotherapy?  Yes; Type: Keytruda; Ongoing? Yes Radiation therapy? Yes Date Range: 08/15/2020 - 08/28/2020; Target: Left pelvis Date Range: 07/07/2020 - 07/16/2020; Target: Right lung, right ribs, right pelvis Date Range: 01/01/2020 - 01/14/2020; Target: Right chest, right ribs, right pelvis EXAM: CT CHEST, ABDOMEN, AND PELVIS WITH CONTRAST TECHNIQUE: Multidetector CT imaging of the chest, abdomen and pelvis was performed following the standard protocol during bolus administration of intravenous contrast. CONTRAST:  136mL OMNIPAQUE IOHEXOL 300 MG/ML  SOLN COMPARISON:  Multiple exams, including CT chest, abdomen and pelvis 06/06/2020 and 12/20/2019 PET-CT. FINDINGS: CT CHEST FINDINGS Cardiovascular: Coronary, aortic arch, and branch vessel atherosclerotic vascular disease. Stable appearance of web  like peripheral filling defect best seen in the left lower lobe pulmonary artery on image 29 of series 2 compatible with remote/chronic pulmonary embolus. Mediastinum/Nodes: Left supraclavicular node 1.6 cm in short axis on image 9 series 2, previously 0.6 cm. AP window lymph node 1.2 cm in short axis on image 22 series 2, previously 0.5 cm. Right hilar node 1.8 cm in short axis on image 27 series 2, previously the same. Right upper paratracheal/paraesophageal lymph node 1.2 cm in short axis on image 10 series 2, previously 1.3 cm. Stable subcarinal lymph node. Lungs/Pleura: Right upper lobe mass 3.6 by 2.1 cm on image 54 of series 6, previously 3.0 by 1.2 cm by my measurements. Right lower lobe sub solid peribronchovascular nodule 1.1 cm in diameter on image 88 of series 6, new compared to previous. Airway thickening is present, suggesting  bronchitis or reactive airways disease. New trace right pleural effusion. Musculoskeletal: Progressive prominent lytic lesion of the left medial clavicle. Small lytic lesion of the left anterior humeral head on image 8 of series 6, new compared to previous. Worsening lytic component of the lytic and sclerotic sternal lesion, lytic component currently 2.6 by 1.6 cm on image 134 of series 5, and previously 1.0 by 0.6 cm. Stable sclerotic lesion of the right lateral eighth rib. A large lytic lesion of the right medial eleventh rib partially scallops the T11 vertebral body, and measures 5.2 by 2.9 cm on image 53 of series 2, previously 1.4 by 1.0 cm. CT ABDOMEN PELVIS FINDINGS Hepatobiliary: Newly appreciable hypoenhancing mass measuring 3.0 by 2.1 cm in the right hepatic lobe on image 65 of series 2. Scalloped inferior contour of the right hepatic lobe adjacent to a 2.3 by 1.0 cm inferior right hepatic lobe subcapsular lesion on image 80 of series 4. Gallbladder unremarkable. No biliary dilatation. Pancreas: Unremarkable Spleen: Unremarkable Adrenals/Urinary Tract: Unremarkable Stomach/Bowel: Indistinct density at the anorectal junction is nonspecific and probably incidental for example on image 131 of series 2, correlate with any rectal symptoms. There is no specific abnormality in this vicinity on prior CT. Vascular/Lymphatic: Small but increased periaortic lymph nodes are present including a 0.7 cm left periaortic node on image 75 of series 2, previously 0.3 cm. Right external iliac node enlarged at 1.4 cm in short axis on image 108 of series 2, previously 0.3 cm. Aortoiliac atherosclerotic vascular disease. Reproductive: Unremarkable Other: Mild presacral edema. Musculoskeletal: New and progressive osseous metastatic lesions of the right upper sacrum, the left upper iliac bone, the right anterior iliac bone, the left lower sacrum, the posterior wall the right acetabulum, and the left posterior femoral neck and  greater trochanter. Since the prior CT there is been left hip ORIF. IMPRESSION: 1. Substantial worsening/progression including the thoracic adenopathy, right upper lobe mass, widespread scattered lytic metastatic disease, and hepatic metastatic disease as detailed above. 2. Stable appearance of chronic pulmonary embolus best seen in the left lower lobe pulmonary artery. 3. Airway thickening is present, suggesting bronchitis or reactive airways disease. 4. New trace right pleural effusion. 5. Indistinct density at the anorectal junction, probably incidental, less likely due to an underlying mass. 6.  Aortic Atherosclerosis (ICD10-I70.0). Electronically Signed   By: Van Clines M.D.   On: 09/09/2020 10:27   CT Abdomen Pelvis W Contrast  Result Date: 09/09/2020 CLINICAL DATA:  Primary Cancer Type: Lung Imaging Indication: Assess response to therapy Interval therapy since last imaging? Yes Initial Cancer Diagnosis Date: 12/18/2019; Established by: Biopsy-proven Detailed Pathology: Stage IV non-small cell lung  cancer, squamous cell carcinoma. Primary Tumor location: Right upper lobe. Skeletal metastases to sternum, right eighth rib, left femoral neck, left ilium, right acetabulum, right side S1. Surgeries: No thoracic. Prophylactic stabilization of impending pathologic fracture of left intertrochanteric femur 07/25/2020. Chemotherapy: Yes; Ongoing? No; Most recent administration: 04/02/2020 Immunotherapy?  Yes; Type: Keytruda; Ongoing? Yes Radiation therapy? Yes Date Range: 08/15/2020 - 08/28/2020; Target: Left pelvis Date Range: 07/07/2020 - 07/16/2020; Target: Right lung, right ribs, right pelvis Date Range: 01/01/2020 - 01/14/2020; Target: Right chest, right ribs, right pelvis EXAM: CT CHEST, ABDOMEN, AND PELVIS WITH CONTRAST TECHNIQUE: Multidetector CT imaging of the chest, abdomen and pelvis was performed following the standard protocol during bolus administration of intravenous contrast. CONTRAST:   186mL OMNIPAQUE IOHEXOL 300 MG/ML  SOLN COMPARISON:  Multiple exams, including CT chest, abdomen and pelvis 06/06/2020 and 12/20/2019 PET-CT. FINDINGS: CT CHEST FINDINGS Cardiovascular: Coronary, aortic arch, and branch vessel atherosclerotic vascular disease. Stable appearance of web like peripheral filling defect best seen in the left lower lobe pulmonary artery on image 29 of series 2 compatible with remote/chronic pulmonary embolus. Mediastinum/Nodes: Left supraclavicular node 1.6 cm in short axis on image 9 series 2, previously 0.6 cm. AP window lymph node 1.2 cm in short axis on image 22 series 2, previously 0.5 cm. Right hilar node 1.8 cm in short axis on image 27 series 2, previously the same. Right upper paratracheal/paraesophageal lymph node 1.2 cm in short axis on image 10 series 2, previously 1.3 cm. Stable subcarinal lymph node. Lungs/Pleura: Right upper lobe mass 3.6 by 2.1 cm on image 54 of series 6, previously 3.0 by 1.2 cm by my measurements. Right lower lobe sub solid peribronchovascular nodule 1.1 cm in diameter on image 88 of series 6, new compared to previous. Airway thickening is present, suggesting bronchitis or reactive airways disease. New trace right pleural effusion. Musculoskeletal: Progressive prominent lytic lesion of the left medial clavicle. Small lytic lesion of the left anterior humeral head on image 8 of series 6, new compared to previous. Worsening lytic component of the lytic and sclerotic sternal lesion, lytic component currently 2.6 by 1.6 cm on image 134 of series 5, and previously 1.0 by 0.6 cm. Stable sclerotic lesion of the right lateral eighth rib. A large lytic lesion of the right medial eleventh rib partially scallops the T11 vertebral body, and measures 5.2 by 2.9 cm on image 53 of series 2, previously 1.4 by 1.0 cm. CT ABDOMEN PELVIS FINDINGS Hepatobiliary: Newly appreciable hypoenhancing mass measuring 3.0 by 2.1 cm in the right hepatic lobe on image 65 of series 2.  Scalloped inferior contour of the right hepatic lobe adjacent to a 2.3 by 1.0 cm inferior right hepatic lobe subcapsular lesion on image 80 of series 4. Gallbladder unremarkable. No biliary dilatation. Pancreas: Unremarkable Spleen: Unremarkable Adrenals/Urinary Tract: Unremarkable Stomach/Bowel: Indistinct density at the anorectal junction is nonspecific and probably incidental for example on image 131 of series 2, correlate with any rectal symptoms. There is no specific abnormality in this vicinity on prior CT. Vascular/Lymphatic: Small but increased periaortic lymph nodes are present including a 0.7 cm left periaortic node on image 75 of series 2, previously 0.3 cm. Right external iliac node enlarged at 1.4 cm in short axis on image 108 of series 2, previously 0.3 cm. Aortoiliac atherosclerotic vascular disease. Reproductive: Unremarkable Other: Mild presacral edema. Musculoskeletal: New and progressive osseous metastatic lesions of the right upper sacrum, the left upper iliac bone, the right anterior iliac bone, the left lower sacrum,  the posterior wall the right acetabulum, and the left posterior femoral neck and greater trochanter. Since the prior CT there is been left hip ORIF. IMPRESSION: 1. Substantial worsening/progression including the thoracic adenopathy, right upper lobe mass, widespread scattered lytic metastatic disease, and hepatic metastatic disease as detailed above. 2. Stable appearance of chronic pulmonary embolus best seen in the left lower lobe pulmonary artery. 3. Airway thickening is present, suggesting bronchitis or reactive airways disease. 4. New trace right pleural effusion. 5. Indistinct density at the anorectal junction, probably incidental, less likely due to an underlying mass. 6.  Aortic Atherosclerosis (ICD10-I70.0). Electronically Signed   By: Van Clines M.D.   On: 09/09/2020 10:27   XR HIP UNILAT W OR W/O PELVIS 2-3 VIEWS LEFT  Result Date: 09/19/2020 Stable alignment  of the hardware without interval change    ASSESSMENT/PLAN:  This is a very pleasant 34 year oldCaucasianmale recently diagnosed with a stage IV (T2b, N3, M1C) non-small cell lung cancer, squamous cell carcinoma presented with right upper lobe lung mass in addition to mediastinal and right supraclavicular lymphadenopathy as well as multiple metastatic bone lesions diagnosed in March 2021.  The patientcompletedpalliative radiotherapy to the painful bone lesions under the care of Dr.Kinard.Last treatment on 01/14/20.  The patient had a stabilization of the impending pathological fracture of the left hip in October 2021.   The patient underwent palliative systemic chemotherapy with carboplatin for an AUC of 5, paclitaxel 175 mg per metered squared, Keytruda 200 mg IV every 3 weeks with Neulasta support. He is status post 9 cycles. This was discontinued due to evidence for disease progression.   Dr. Julien Nordmann discussed the scan results of his restaging CT scan. There was unfortunately substantial worsening/progression including the thoracic adenopathy, right upper lobe mass, widespread scattered lytic metastatic disease, and hepatic metastatic disease.   Dr. Julien Nordmann had a lengthly discussion with the patient today about his current condition and treatment options. The patient was given the option of a referral to hospice/palliative vs. Treatment with systemic chemotherapy with systemic chemotherapy with docetaxel 75 mg per metered squared and Cyramza 10 mg/kg IV every 3 weeks with Neulasta support. The patient is interested in proceeding with systemic chemotherapy but will let us know after thinking it over if he changes his mind.  He is expected to start his first dose of this treatment on 10/01/20.  We discussed the adverse side effects of treatment including but not limited to alopecia, myelosuppression, nausea and vomiting, peripheral neuropathy, liver or renal dysfunction as well as  immunotherapy mediated adverse effects.     I have sent a prescription for Decadron 4 mg tablets to take 2 tablets twice a day the day before, the day of, and the day after chemotherapy. I also sent a refill of oxycodone to the patient's pharmacy to take every 6 hours as needed for break through pain. Dr. Julien Nordmann discussed pain management. Dr. Julien Nordmann discontinued MS contin BID. We will send in 50 mcg of fentanyl patches. We will titrate based on patient symptoms/pain control.   The patient will follow-up in 2 weeks for a one-week follow-up visit after completing his first cycle of chemotherapy.  The patient will see Dr. Sondra Come from radiation oncology next week. Advised him to discuss areas of pain with him and if he is a candidate for any palliative radiation to the painful osseous metastases.   The patient was advised to call immediately if he has any concerning symptoms in the interval. The patient voices understanding  of current disease status and treatment options and is in agreement with the current care plan. All questions were answered. The patient knows to call the clinic with any problems, questions or concerns. We can certainly see the patient much sooner if necessary   Orders Placed This Encounter  Procedures  . CBC with Differential (Cancer Center Only)    Standing Status:   Standing    Number of Occurrences:   30    Standing Expiration Date:   09/25/2021  . CMP ([facility] only)    Standing Status:   Standing    Number of Occurrences:   30    Standing Expiration Date:   09/25/2021  . Total Protein, Urine dipstick    Standing Status:   Standing    Number of Occurrences:   10    Standing Expiration Date:   09/25/2021     Tobe Sos Abbygail [place], PA-C 09/25/20  ADDENDUM: Hematology/Oncology Attending: I had a face-to-face encounter with the patient.  I recommended his care plan.  This is a very pleasant 54 years old white male diagnosed with a stage IV non-small cell  lung cancer, squamous cell carcinoma in March 2021.  The patient completed palliative radiotherapy to multiple painful bone lesions in March 2021 he also has a stabilization of impending pathological fracture of the left hip in October 2021.  The patient started systemic chemotherapy with induction carboplatin, paclitaxel and Keytruda for 4 cycles.  He also completed 5 cycles of maintenance treatment with Beryle Flock which was interrupted during his surgical procedure. He had repeat CT scan of the chest, abdomen pelvis performed recently.  I personally and independently reviewed the scan with the patient and his wife. Unfortunately his scan showed evidence for disease progression and thoracic lymphadenopathy, right upper lobe lung mass as well as widespread lytic metastatic bone and hepatic lesion. I had a lengthy discussion with the patient about his condition and treatment options.  I gave him the option of palliative care and hospice referral versus palliative systemic chemotherapy with docetaxel and Cyramza with Neulasta support. The patient is interested in treatment and we will arrange for him to receive the first dose of this treatment next week.  I discussed with him the adverse effect of this treatment. He will come back for follow-up visit in 2 weeks for evaluation and management of any adverse effect of his treatment. The patient was advised to call immediately if he has any concerning symptoms in the interval.  Disclaimer: This note was dictated with voice recognition software. Similar sounding words can inadvertently be transcribed and may be missed upon review. Eilleen Kempf, MD 09/29/20

## 2020-09-25 ENCOUNTER — Encounter: Payer: Self-pay | Admitting: Physician Assistant

## 2020-09-25 ENCOUNTER — Inpatient Hospital Stay: Payer: 59

## 2020-09-25 ENCOUNTER — Other Ambulatory Visit: Payer: 59

## 2020-09-25 ENCOUNTER — Ambulatory Visit: Payer: 59

## 2020-09-25 ENCOUNTER — Inpatient Hospital Stay: Payer: 59 | Attending: Physician Assistant | Admitting: Physician Assistant

## 2020-09-25 ENCOUNTER — Other Ambulatory Visit: Payer: Self-pay

## 2020-09-25 ENCOUNTER — Other Ambulatory Visit: Payer: Self-pay | Admitting: Family Medicine

## 2020-09-25 VITALS — BP 128/77 | HR 106 | Temp 98.4°F | Resp 18 | Ht 71.0 in | Wt 240.9 lb

## 2020-09-25 DIAGNOSIS — E119 Type 2 diabetes mellitus without complications: Secondary | ICD-10-CM | POA: Diagnosis not present

## 2020-09-25 DIAGNOSIS — C3411 Malignant neoplasm of upper lobe, right bronchus or lung: Secondary | ICD-10-CM | POA: Diagnosis present

## 2020-09-25 DIAGNOSIS — G4733 Obstructive sleep apnea (adult) (pediatric): Secondary | ICD-10-CM | POA: Diagnosis not present

## 2020-09-25 DIAGNOSIS — Z79899 Other long term (current) drug therapy: Secondary | ICD-10-CM | POA: Insufficient documentation

## 2020-09-25 DIAGNOSIS — C7951 Secondary malignant neoplasm of bone: Secondary | ICD-10-CM | POA: Insufficient documentation

## 2020-09-25 DIAGNOSIS — Z7189 Other specified counseling: Secondary | ICD-10-CM | POA: Diagnosis not present

## 2020-09-25 DIAGNOSIS — E7800 Pure hypercholesterolemia, unspecified: Secondary | ICD-10-CM | POA: Insufficient documentation

## 2020-09-25 DIAGNOSIS — Z5111 Encounter for antineoplastic chemotherapy: Secondary | ICD-10-CM | POA: Diagnosis present

## 2020-09-25 DIAGNOSIS — F17210 Nicotine dependence, cigarettes, uncomplicated: Secondary | ICD-10-CM | POA: Diagnosis not present

## 2020-09-25 DIAGNOSIS — Z95828 Presence of other vascular implants and grafts: Secondary | ICD-10-CM

## 2020-09-25 DIAGNOSIS — Z7901 Long term (current) use of anticoagulants: Secondary | ICD-10-CM | POA: Diagnosis not present

## 2020-09-25 DIAGNOSIS — G893 Neoplasm related pain (acute) (chronic): Secondary | ICD-10-CM | POA: Diagnosis not present

## 2020-09-25 DIAGNOSIS — Z5112 Encounter for antineoplastic immunotherapy: Secondary | ICD-10-CM | POA: Diagnosis present

## 2020-09-25 DIAGNOSIS — C3491 Malignant neoplasm of unspecified part of right bronchus or lung: Secondary | ICD-10-CM

## 2020-09-25 DIAGNOSIS — K589 Irritable bowel syndrome without diarrhea: Secondary | ICD-10-CM | POA: Insufficient documentation

## 2020-09-25 LAB — CBC WITH DIFFERENTIAL (CANCER CENTER ONLY)
Abs Immature Granulocytes: 0.02 10*3/uL (ref 0.00–0.07)
Basophils Absolute: 0 10*3/uL (ref 0.0–0.1)
Basophils Relative: 0 %
Eosinophils Absolute: 0.1 10*3/uL (ref 0.0–0.5)
Eosinophils Relative: 1 %
HCT: 33.2 % — ABNORMAL LOW (ref 39.0–52.0)
Hemoglobin: 10.5 g/dL — ABNORMAL LOW (ref 13.0–17.0)
Immature Granulocytes: 0 %
Lymphocytes Relative: 20 %
Lymphs Abs: 0.9 10*3/uL (ref 0.7–4.0)
MCH: 28.4 pg (ref 26.0–34.0)
MCHC: 31.6 g/dL (ref 30.0–36.0)
MCV: 89.7 fL (ref 80.0–100.0)
Monocytes Absolute: 0.8 10*3/uL (ref 0.1–1.0)
Monocytes Relative: 16 %
Neutro Abs: 2.9 10*3/uL (ref 1.7–7.7)
Neutrophils Relative %: 63 %
Platelet Count: 274 10*3/uL (ref 150–400)
RBC: 3.7 MIL/uL — ABNORMAL LOW (ref 4.22–5.81)
RDW: 15.7 % — ABNORMAL HIGH (ref 11.5–15.5)
WBC Count: 4.7 10*3/uL (ref 4.0–10.5)
nRBC: 0 % (ref 0.0–0.2)

## 2020-09-25 LAB — CMP (CANCER CENTER ONLY)
ALT: 19 U/L (ref 0–44)
AST: 27 U/L (ref 15–41)
Albumin: 2.5 g/dL — ABNORMAL LOW (ref 3.5–5.0)
Alkaline Phosphatase: 91 U/L (ref 38–126)
Anion gap: 12 (ref 5–15)
BUN: 6 mg/dL (ref 6–20)
CO2: 29 mmol/L (ref 22–32)
Calcium: 9.9 mg/dL (ref 8.9–10.3)
Chloride: 95 mmol/L — ABNORMAL LOW (ref 98–111)
Creatinine: 0.72 mg/dL (ref 0.61–1.24)
GFR, Estimated: >60 mL/min
Glucose, Bld: 83 mg/dL (ref 70–99)
Potassium: 4.6 mmol/L (ref 3.5–5.1)
Sodium: 136 mmol/L (ref 135–145)
Total Bilirubin: 0.3 mg/dL (ref 0.3–1.2)
Total Protein: 7.6 g/dL (ref 6.5–8.1)

## 2020-09-25 LAB — TSH: TSH: 3.158 u[IU]/mL (ref 0.320–4.118)

## 2020-09-25 MED ORDER — DIAZEPAM 5 MG PO TABS
5.0000 mg | ORAL_TABLET | Freq: Two times a day (BID) | ORAL | 0 refills | Status: DC | PRN
Start: 2020-09-25 — End: 2020-11-16

## 2020-09-25 MED ORDER — DEXAMETHASONE 4 MG PO TABS: ORAL_TABLET | ORAL | 2 refills | Status: AC

## 2020-09-25 MED ORDER — OXYCODONE-ACETAMINOPHEN 5-325 MG PO TABS: 1.0000 | ORAL_TABLET | Freq: Four times a day (QID) | ORAL | 0 refills | Status: DC | PRN

## 2020-09-25 MED ORDER — SODIUM CHLORIDE 0.9% FLUSH
10.0000 mL | Freq: Once | INTRAVENOUS | Status: AC
  Administered 2020-09-25: 10 mL
  Filled 2020-09-25: qty 10

## 2020-09-25 MED ORDER — FENTANYL 50 MCG/HR TD PT72: 1.0000 | MEDICATED_PATCH | TRANSDERMAL | 0 refills | Status: DC

## 2020-09-25 MED ORDER — HEPARIN SOD (PORK) LOCK FLUSH 100 UNIT/ML IV SOLN
500.0000 [IU] | Freq: Once | INTRAVENOUS | Status: AC
  Administered 2020-09-25: 500 [IU]
  Filled 2020-09-25: qty 5

## 2020-09-25 NOTE — Telephone Encounter (Signed)
Name of Medication: Diazepam Name of Pharmacy: [facility] Last Fill or Written Date and Quantity: 06/27/20, #30 Last Office Visit and Type: 02/22/20, 3 mo DM f/u Next Office Visit and Type: 12/12/20, CPE Last Controlled Substance Agreement Date: none Last UDS: none

## 2020-09-25 NOTE — Telephone Encounter (Signed)
ERx 

## 2020-09-25 NOTE — Patient Instructions (Addendum)
-  We covered a lot of important information at your appointment today regarding what the treatment plan is moving forward. Here are the the main points that were discussed at your office visit with [place] today:  -The treatment that you will receive consists of two chemotherapy drugs, called Docetaxel and Cyramza (Ramucirumab) -We are planning on starting your treatment next week on 10/01/20 -Your treatment will be given once every 3 weeks. We will check your labs once a week just to make sure that important components of your blood are in an acceptable range -You will receive an injection about 3 days after chemotherapy. The purpose of this injection is to boost your bodies infection fighting cells to protect you from getting an infection.  -We will get a CT scan after 3 treatments to check on the progress of treatment  Medications:  -I have sent a few important medication prescriptions to your pharmacy.  -Compazine was sent to your pharmacy. This medication is for nausea. You may take this every 6 hours as needed if you feel nausous.  -I have sent a prescription for Decadron (Dexamethasone) to your pharmacy. This medication is a steroids. The purpose of this medication is because it is one if the pre-medications that is needed before you get treatment. You will need to take Two Tablets Twice a day (for a total of 4 tablets a day) the day before, the day of, and the day after chemotherapy. This means that you will take 12 tablets total over those 3 consecutive days.  -I will send a prescription for fentanyl patches to your pharmacy for pain control.   Side effects: The side effects of treatment may include but are not limited to alopecia (losing your hair), myelosuppression (dropping your blood counts), nausea and vomiting, peripheral neuropathy (numbness in the hands and feet), liver or renal dysfunction as well as the adverse effect of Cyramza including increased risk for hemorrhage (bleeding) and GI  perforation. Please seek medical attention if you develop concerning or significant bleeding.   Follow up:  -We will see you back for a follow up visit in 2 weeks for a 1 week follow up visit.

## 2020-09-29 ENCOUNTER — Encounter: Payer: Self-pay | Admitting: Radiation Oncology

## 2020-09-29 ENCOUNTER — Ambulatory Visit
Admission: RE | Admit: 2020-09-29 | Discharge: 2020-09-29 | Disposition: A | Discharge status: Home / Self Care | Payer: 59 | Source: Ambulatory Visit | Attending: Radiation Oncology | Admitting: Radiation Oncology

## 2020-09-29 ENCOUNTER — Other Ambulatory Visit: Payer: Self-pay

## 2020-09-29 VITALS — BP 125/87 | HR 102 | Temp 96.2°F | Resp 18 | Ht 71.0 in

## 2020-09-29 DIAGNOSIS — I2782 Chronic pulmonary embolism: Secondary | ICD-10-CM | POA: Diagnosis not present

## 2020-09-29 DIAGNOSIS — Z923 Personal history of irradiation: Secondary | ICD-10-CM | POA: Diagnosis not present

## 2020-09-29 DIAGNOSIS — C778 Secondary and unspecified malignant neoplasm of lymph nodes of multiple regions: Secondary | ICD-10-CM | POA: Diagnosis not present

## 2020-09-29 DIAGNOSIS — C7951 Secondary malignant neoplasm of bone: Secondary | ICD-10-CM | POA: Insufficient documentation

## 2020-09-29 DIAGNOSIS — J90 Pleural effusion, not elsewhere classified: Secondary | ICD-10-CM | POA: Insufficient documentation

## 2020-09-29 DIAGNOSIS — C3411 Malignant neoplasm of upper lobe, right bronchus or lung: Secondary | ICD-10-CM | POA: Diagnosis not present

## 2020-09-29 DIAGNOSIS — Z9221 Personal history of antineoplastic chemotherapy: Secondary | ICD-10-CM | POA: Insufficient documentation

## 2020-09-29 DIAGNOSIS — Z7984 Long term (current) use of oral hypoglycemic drugs: Secondary | ICD-10-CM | POA: Diagnosis not present

## 2020-09-29 DIAGNOSIS — R160 Hepatomegaly, not elsewhere classified: Secondary | ICD-10-CM | POA: Insufficient documentation

## 2020-09-29 DIAGNOSIS — Z79899 Other long term (current) drug therapy: Secondary | ICD-10-CM | POA: Diagnosis not present

## 2020-09-29 DIAGNOSIS — Z7901 Long term (current) use of anticoagulants: Secondary | ICD-10-CM | POA: Diagnosis not present

## 2020-09-29 DIAGNOSIS — C3491 Malignant neoplasm of unspecified part of right bronchus or lung: Secondary | ICD-10-CM

## 2020-09-29 MED ORDER — MORPHINE SULFATE ER 60 MG PO TBCR
60.0000 mg | EXTENDED_RELEASE_TABLET | Freq: Two times a day (BID) | ORAL | 0 refills | Status: DC
Start: 2020-09-29 — End: 2020-10-07

## 2020-09-29 NOTE — Progress Notes (Signed)
Patient here today for a 1 month follow-up appointment after radiation treatment. Patient states that he has severe left shoulder pain that is a 10/10 on the pain scale. Patient states that he is taking morphine for the pain. Patient states mild fatigue. Patient states having a productive cough with white sputum last week that has resolved. Patient denies having shortness of breath. Patient states no issues with swallowing. Patient denies skin changes from radiation treatment. Patient states that he is supposed to start chemotherapy treatments tomorrow.   BP 125/87 (BP Location: Right Arm, Patient Position: Sitting)    Pulse (!) 102    Temp (!) 96.2 F (35.7 C) (Temporal)    Resp 18    Ht 5\' 11"  (1.803 m)    SpO2 97%    BMI 33.60 kg/m

## 2020-09-29 NOTE — Progress Notes (Signed)
Radiation Oncology         ([phone]) [phone] ________________________________  Name: Mike [name] MRN: [id number]  Date: 09/29/2020  DOB: Feb 21, 1966  Follow-Up Visit Note  CC: [name], MD  [name], MD    ICD-10-CM   1. Stage IV squamous cell carcinoma of right lung [facility])  C34.91     Diagnosis: Stage IV (T2b, N3, M1C) non-small cell lung cancer, squamous cell carcinoma presented with right upper lobe lung mass in addition to mediastinal and right supraclavicular lymphadenopathy as well as multiple metastatic bone lesions  Interval Since Last Radiation: Two months and two weeks  Radiation Treatment Dates: 01/01/2020 through 01/14/2020 and 07/07/2020 through 07/16/2020 Site Technique Total Dose (Gy) Dose per Fx (Gy) Completed Fx Beam Energies  Hip, Right: Pelvis_Rt 3D 30/30 3 10/10 10X, 15X  Ribs, Right: Chest_Rt 3D 30/30 3 10/10 10X  Chest: Chest 3D 30/30 3 10/10 6X  Lung, Right: Lung_Rt 3D 20/20 2.5 8/8 10X, 15X  Ribs, Right: Chest_Rt 3D 20/20 2.5 8/8 15X    Narrative:  The patient returns today for routine follow-up. CT scan of chest/abdomen/pelvis on 09/08/2020 showed substantial worsening/progression including the thoracic adenopathy, right upper lobe mass, widespread scattered lytic metastatic disease, and hepatic metastatic disease. Chronic PE appeared stable. Finally, there was airways thickening present that suggested bronchitis or reactive airways disease, a new trace right pleural effusion, aortic atherosclerosis, and an indistinct density at the anorectal junction that was probably incidental and less likely due to an underlying mass.   Given the above findings, the patient was given the option of referral to hospice/palliative vs treatment with systemic chemotherapy with Docetaxel and Cyramza with Neulasta support. On 09/25/2020, the patient was interested in proceeding with systemic chemotherapy with his first dose scheduled to be in two days.  On review  of systems, he reports significant improvement in his pelvic pain and leg areas with his most recent radiation treatment.  He does however report significant pain in the left shoulder area.  I reviewed the patient's most recent chest CT scan and there are no immediate reasons to explain his pain in this region.  He does have some pain on the left side of his neck suggesting possible cervical spine issue.  We discussed potential additional imaging to evaluate this issue but he would like to wait and see what effect is chemotherapy may have on these areas.  Patient has been switched to Duragesic patch and taken off his mask Contin however the patient has not had approval of this medication change twice insurance company and is about 1 out of his MS Contin.  Light of this I given a limited refill of this until his Duragesic patch has been approved.               ALLERGIES:  has No Known Allergies.  Meds: Current Outpatient Medications  Medication Sig Dispense Refill  . acetaminophen (TYLENOL) 325 MG tablet Take 1-2 tablets (325-650 mg total) by mouth every 6 (six) hours as needed for mild pain (pain score 1-3 or temp > 100.5).    [name] albuterol (PROVENTIL) (2.5 MG/3ML) 0.083% nebulizer solution Take 3 mLs (2.5 mg total) by nebulization every 6 (six) hours as needed for wheezing or shortness of breath. 75 mL 12  . atorvastatin (LIPITOR) 20 MG tablet Take 1 tablet (20 mg total) by mouth at bedtime. 90 tablet 3  . dexamethasone (DECADRON) 4 MG tablet Take 2 tablets TWICE a day the day before, the day of,  and the day after chemotherapy. 60 tablet 2  . diazepam (VALIUM) 5 MG tablet Take 1 tablet (5 mg total) by mouth every 12 (twelve) hours as needed for anxiety or muscle spasms. 30 tablet 0  . docusate sodium (COLACE) 100 MG capsule Take 1 capsule (100 mg total) by mouth 2 (two) times daily. 10 capsule 0  . ELIQUIS 5 MG TABS tablet TAKE 1 TABLET BY MOUTH TWICE A DAY 60 tablet 3  . fentaNYL (DURAGESIC) 50  MCG/HR Place 1 patch onto the skin every 3 (three) days. 5 patch 0  . furosemide (LASIX) 20 MG tablet Take 1 tablet (20 mg total) by mouth daily. 5 tablet 0  . metFORMIN (GLUCOPHAGE) 1000 MG tablet Take 1 tablet (1,000 mg total) by mouth 2 (two) times daily with a meal. 180 tablet 0  . methocarbamol (ROBAXIN) 500 MG tablet Take 1 tablet (500 mg total) by mouth every 6 (six) hours as needed for muscle spasms. 60 tablet 6  . Oxycodone HCl 10 MG TABS Take 1 tablet (10 mg total) by mouth every 3 (three) hours as needed (for severe breakthrough pain). 60 tablet 0  . oxyCODONE-acetaminophen (PERCOCET) 5-325 MG tablet Take 1-2 tablets by mouth every 6 (six) hours as needed for moderate pain. 40 tablet 0  . sildenafil (REVATIO) 20 MG tablet Take 1-5 tablets (20-100 mg total) by mouth daily as needed (ED). (Patient taking differently: Take 40 mg by mouth daily as needed (ED).) 30 tablet 3  . morphine (MS CONTIN) 60 MG 12 hr tablet Take 1 tablet (60 mg total) by mouth every 12 (twelve) hours. 6 tablet 0   No current facility-administered medications for this encounter.    Physical Findings: The patient is in no acute distress. Patient is alert and oriented.  height is 5\' 11"  (1.803 m). His temporal temperature is 96.2 F (35.7 C) (abnormal). His blood pressure is 125/87 and his pulse is 102 (abnormal). His respiration is 18 and oxygen saturation is 97%.  Lungs are clear to auscultation bilaterally. Heart has regular rate and rhythm. No palpable cervical, supraclavicular, or axillary adenopathy. Abdomen soft, non-tender, normal bowel sounds.  Palpation along the left shoulder area reveals no obvious point tenderness.  No obvious palpable mass.  Patient has some mild tenderness with palpation along the muscles of the left lateral neck region  Lab Findings: Lab Results  Component Value Date   WBC 4.7 09/25/2020   HGB 10.5 (L) 09/25/2020   HCT 33.2 (L) 09/25/2020   MCV 89.7 09/25/2020   PLT 274 09/25/2020     Radiographic Findings: CT Chest W Contrast  Result Date: 09/09/2020 CLINICAL DATA:  Primary Cancer Type: Lung Imaging Indication: Assess response to therapy Interval therapy since last imaging? Yes Initial Cancer Diagnosis Date: 12/18/2019; Established by: Biopsy-proven Detailed Pathology: Stage IV non-small cell lung cancer, squamous cell carcinoma. Primary Tumor location: Right upper lobe. Skeletal metastases to sternum, right eighth rib, left femoral neck, left ilium, right acetabulum, right side S1. Surgeries: No thoracic. Prophylactic stabilization of impending pathologic fracture of left intertrochanteric femur 07/25/2020. Chemotherapy: Yes; Ongoing? No; Most recent administration: 04/02/2020 Immunotherapy?  Yes; Type: Keytruda; Ongoing? Yes Radiation therapy? Yes Date Range: 08/15/2020 - 08/28/2020; Target: Left pelvis Date Range: 07/07/2020 - 07/16/2020; Target: Right lung, right ribs, right pelvis Date Range: 01/01/2020 - 01/14/2020; Target: Right chest, right ribs, right pelvis EXAM: CT CHEST, ABDOMEN, AND PELVIS WITH CONTRAST TECHNIQUE: Multidetector CT imaging of the chest, abdomen and pelvis was performed following the standard  protocol during bolus administration of intravenous contrast. CONTRAST:  182mL OMNIPAQUE IOHEXOL 300 MG/ML  SOLN COMPARISON:  Multiple exams, including CT chest, abdomen and pelvis 06/06/2020 and 12/20/2019 PET-CT. FINDINGS: CT CHEST FINDINGS Cardiovascular: Coronary, aortic arch, and branch vessel atherosclerotic vascular disease. Stable appearance of web like peripheral filling defect best seen in the left lower lobe pulmonary artery on image 29 of series 2 compatible with remote/chronic pulmonary embolus. Mediastinum/Nodes: Left supraclavicular node 1.6 cm in short axis on image 9 series 2, previously 0.6 cm. AP window lymph node 1.2 cm in short axis on image 22 series 2, previously 0.5 cm. Right hilar node 1.8 cm in short axis on image 27 series 2, previously the  same. Right upper paratracheal/paraesophageal lymph node 1.2 cm in short axis on image 10 series 2, previously 1.3 cm. Stable subcarinal lymph node. Lungs/Pleura: Right upper lobe mass 3.6 by 2.1 cm on image 54 of series 6, previously 3.0 by 1.2 cm by my measurements. Right lower lobe sub solid peribronchovascular nodule 1.1 cm in diameter on image 88 of series 6, new compared to previous. Airway thickening is present, suggesting bronchitis or reactive airways disease. New trace right pleural effusion. Musculoskeletal: Progressive prominent lytic lesion of the left medial clavicle. Small lytic lesion of the left anterior humeral head on image 8 of series 6, new compared to previous. Worsening lytic component of the lytic and sclerotic sternal lesion, lytic component currently 2.6 by 1.6 cm on image 134 of series 5, and previously 1.0 by 0.6 cm. Stable sclerotic lesion of the right lateral eighth rib. A large lytic lesion of the right medial eleventh rib partially scallops the T11 vertebral body, and measures 5.2 by 2.9 cm on image 53 of series 2, previously 1.4 by 1.0 cm. CT ABDOMEN PELVIS FINDINGS Hepatobiliary: Newly appreciable hypoenhancing mass measuring 3.0 by 2.1 cm in the right hepatic lobe on image 65 of series 2. Scalloped inferior contour of the right hepatic lobe adjacent to a 2.3 by 1.0 cm inferior right hepatic lobe subcapsular lesion on image 80 of series 4. Gallbladder unremarkable. No biliary dilatation. Pancreas: Unremarkable Spleen: Unremarkable Adrenals/Urinary Tract: Unremarkable Stomach/Bowel: Indistinct density at the anorectal junction is nonspecific and probably incidental for example on image 131 of series 2, correlate with any rectal symptoms. There is no specific abnormality in this vicinity on prior CT. Vascular/Lymphatic: Small but increased periaortic lymph nodes are present including a 0.7 cm left periaortic node on image 75 of series 2, previously 0.3 cm. Right external iliac node  enlarged at 1.4 cm in short axis on image 108 of series 2, previously 0.3 cm. Aortoiliac atherosclerotic vascular disease. Reproductive: Unremarkable Other: Mild presacral edema. Musculoskeletal: New and progressive osseous metastatic lesions of the right upper sacrum, the left upper iliac bone, the right anterior iliac bone, the left lower sacrum, the posterior wall the right acetabulum, and the left posterior femoral neck and greater trochanter. Since the prior CT there is been left hip ORIF. IMPRESSION: 1. Substantial worsening/progression including the thoracic adenopathy, right upper lobe mass, widespread scattered lytic metastatic disease, and hepatic metastatic disease as detailed above. 2. Stable appearance of chronic pulmonary embolus best seen in the left lower lobe pulmonary artery. 3. Airway thickening is present, suggesting bronchitis or reactive airways disease. 4. New trace right pleural effusion. 5. Indistinct density at the anorectal junction, probably incidental, less likely due to an underlying mass. 6.  Aortic Atherosclerosis (ICD10-I70.0). Electronically Signed   By: Cindra Eves.D.  On: 09/09/2020 10:27   CT Abdomen Pelvis W Contrast  Result Date: 09/09/2020 CLINICAL DATA:  Primary Cancer Type: Lung Imaging Indication: Assess response to therapy Interval therapy since last imaging? Yes Initial Cancer Diagnosis Date: 12/18/2019; Established by: Biopsy-proven Detailed Pathology: Stage IV non-small cell lung cancer, squamous cell carcinoma. Primary Tumor location: Right upper lobe. Skeletal metastases to sternum, right eighth rib, left femoral neck, left ilium, right acetabulum, right side S1. Surgeries: No thoracic. Prophylactic stabilization of impending pathologic fracture of left intertrochanteric femur 07/25/2020. Chemotherapy: Yes; Ongoing? No; Most recent administration: 04/02/2020 Immunotherapy?  Yes; Type: Keytruda; Ongoing? Yes Radiation therapy? Yes Date Range: 08/15/2020  - 08/28/2020; Target: Left pelvis Date Range: 07/07/2020 - 07/16/2020; Target: Right lung, right ribs, right pelvis Date Range: 01/01/2020 - 01/14/2020; Target: Right chest, right ribs, right pelvis EXAM: CT CHEST, ABDOMEN, AND PELVIS WITH CONTRAST TECHNIQUE: Multidetector CT imaging of the chest, abdomen and pelvis was performed following the standard protocol during bolus administration of intravenous contrast. CONTRAST:  140mL OMNIPAQUE IOHEXOL 300 MG/ML  SOLN COMPARISON:  Multiple exams, including CT chest, abdomen and pelvis 06/06/2020 and 12/20/2019 PET-CT. FINDINGS: CT CHEST FINDINGS Cardiovascular: Coronary, aortic arch, and branch vessel atherosclerotic vascular disease. Stable appearance of web like peripheral filling defect best seen in the left lower lobe pulmonary artery on image 29 of series 2 compatible with remote/chronic pulmonary embolus. Mediastinum/Nodes: Left supraclavicular node 1.6 cm in short axis on image 9 series 2, previously 0.6 cm. AP window lymph node 1.2 cm in short axis on image 22 series 2, previously 0.5 cm. Right hilar node 1.8 cm in short axis on image 27 series 2, previously the same. Right upper paratracheal/paraesophageal lymph node 1.2 cm in short axis on image 10 series 2, previously 1.3 cm. Stable subcarinal lymph node. Lungs/Pleura: Right upper lobe mass 3.6 by 2.1 cm on image 54 of series 6, previously 3.0 by 1.2 cm by my measurements. Right lower lobe sub solid peribronchovascular nodule 1.1 cm in diameter on image 88 of series 6, new compared to previous. Airway thickening is present, suggesting bronchitis or reactive airways disease. New trace right pleural effusion. Musculoskeletal: Progressive prominent lytic lesion of the left medial clavicle. Small lytic lesion of the left anterior humeral head on image 8 of series 6, new compared to previous. Worsening lytic component of the lytic and sclerotic sternal lesion, lytic component currently 2.6 by 1.6 cm on image 134 of  series 5, and previously 1.0 by 0.6 cm. Stable sclerotic lesion of the right lateral eighth rib. A large lytic lesion of the right medial eleventh rib partially scallops the T11 vertebral body, and measures 5.2 by 2.9 cm on image 53 of series 2, previously 1.4 by 1.0 cm. CT ABDOMEN PELVIS FINDINGS Hepatobiliary: Newly appreciable hypoenhancing mass measuring 3.0 by 2.1 cm in the right hepatic lobe on image 65 of series 2. Scalloped inferior contour of the right hepatic lobe adjacent to a 2.3 by 1.0 cm inferior right hepatic lobe subcapsular lesion on image 80 of series 4. Gallbladder unremarkable. No biliary dilatation. Pancreas: Unremarkable Spleen: Unremarkable Adrenals/Urinary Tract: Unremarkable Stomach/Bowel: Indistinct density at the anorectal junction is nonspecific and probably incidental for example on image 131 of series 2, correlate with any rectal symptoms. There is no specific abnormality in this vicinity on prior CT. Vascular/Lymphatic: Small but increased periaortic lymph nodes are present including a 0.7 cm left periaortic node on image 75 of series 2, previously 0.3 cm. Right external iliac node enlarged at 1.4 cm  in short axis on image 108 of series 2, previously 0.3 cm. Aortoiliac atherosclerotic vascular disease. Reproductive: Unremarkable Other: Mild presacral edema. Musculoskeletal: New and progressive osseous metastatic lesions of the right upper sacrum, the left upper iliac bone, the right anterior iliac bone, the left lower sacrum, the posterior wall the right acetabulum, and the left posterior femoral neck and greater trochanter. Since the prior CT there is been left hip ORIF. IMPRESSION: 1. Substantial worsening/progression including the thoracic adenopathy, right upper lobe mass, widespread scattered lytic metastatic disease, and hepatic metastatic disease as detailed above. 2. Stable appearance of chronic pulmonary embolus best seen in the left lower lobe pulmonary artery. 3. Airway  thickening is present, suggesting bronchitis or reactive airways disease. 4. New trace right pleural effusion. 5. Indistinct density at the anorectal junction, probably incidental, less likely due to an underlying mass. 6.  Aortic Atherosclerosis (ICD10-I70.0). Electronically Signed   By: Van Clines M.D.   On: 09/09/2020 10:27   XR HIP UNILAT W OR W/O PELVIS 2-3 VIEWS LEFT  Result Date: 09/19/2020 Stable alignment of the hardware without interval change   Impression: Stage IV (T2b, N3, M1C) non-small cell lung cancer, squamous cell carcinoma presented with right upper lobe lung mass in addition to mediastinal and right supraclavicular lymphadenopathy as well as multiple metastatic bone lesions  Recent CT imaging revealed substantial worsening/progression including the thoracic adenopathy, right upper lobe mass, widespread scattered lyric metastatic disease, and hepatic metastatic disease.    Patient received good palliation of his pain in the pelvis and upper leg areas.  As above he has some pain in the left shoulder region.  He will see [place] chemotherapy dose to address this issue.  Plan: The patient is scheduled to undergo his first cycle of systemic chemotherapy with Docetaxel and Cyramza with Neulasta support in two days.  As needed follow-up in radiation oncology.   -----------------------------------   Blair Promise, PhD, MD  This document serves as a record of services personally performed by Gery Pray, MD. It was created on his behalf by Clerance Lav, a trained medical scribe. The creation of this record is based on the scribe's personal observations and the provider's statements to them. This document has been checked and approved by the attending provider.

## 2020-09-30 ENCOUNTER — Telehealth: Payer: Self-pay | Admitting: Medical Oncology

## 2020-09-30 NOTE — Telephone Encounter (Signed)
LVM that Fentanyl prior Josem Kaufmann is being reviewed and processed by [facility].

## 2020-10-01 ENCOUNTER — Telehealth: Payer: Self-pay | Admitting: Medical Oncology

## 2020-10-01 NOTE — Telephone Encounter (Signed)
Left shoulder pain/immobility-"intolerable" Cannot lift left arm above head and cannot stand to have any weight on it.  LVM to call back and see if he wants to com to [facility] today or wait until tomorrow appts. His Fentanyl is ready for pick up.

## 2020-10-02 ENCOUNTER — Inpatient Hospital Stay: Payer: 59

## 2020-10-02 ENCOUNTER — Other Ambulatory Visit: Payer: Self-pay | Admitting: Internal Medicine

## 2020-10-02 ENCOUNTER — Other Ambulatory Visit: Payer: Self-pay

## 2020-10-02 ENCOUNTER — Ambulatory Visit (HOSPITAL_COMMUNITY)
Admission: RE | Admit: 2020-10-02 | Discharge: 2020-10-02 | Disposition: A | Discharge status: Home / Self Care | Payer: 59 | Source: Ambulatory Visit | Attending: Medical | Admitting: Medical

## 2020-10-02 ENCOUNTER — Inpatient Hospital Stay (HOSPITAL_BASED_OUTPATIENT_CLINIC_OR_DEPARTMENT_OTHER): Payer: 59 | Admitting: Medical

## 2020-10-02 VITALS — BP 131/77 | HR 102 | Temp 97.5°F | Resp 18 | Ht 71.0 in | Wt 245.4 lb

## 2020-10-02 DIAGNOSIS — G893 Neoplasm related pain (acute) (chronic): Secondary | ICD-10-CM | POA: Diagnosis present

## 2020-10-02 DIAGNOSIS — C7951 Secondary malignant neoplasm of bone: Secondary | ICD-10-CM

## 2020-10-02 DIAGNOSIS — C3491 Malignant neoplasm of unspecified part of right bronchus or lung: Secondary | ICD-10-CM

## 2020-10-02 DIAGNOSIS — Z95828 Presence of other vascular implants and grafts: Secondary | ICD-10-CM

## 2020-10-02 DIAGNOSIS — Z5112 Encounter for antineoplastic immunotherapy: Secondary | ICD-10-CM | POA: Diagnosis not present

## 2020-10-02 LAB — CMP (CANCER CENTER ONLY)
ALT: 15 U/L (ref 0–44)
AST: 15 U/L (ref 15–41)
Albumin: 2.3 g/dL — ABNORMAL LOW (ref 3.5–5.0)
Alkaline Phosphatase: 89 U/L (ref 38–126)
Anion gap: 9 (ref 5–15)
BUN: 11 mg/dL (ref 6–20)
CO2: 28 mmol/L (ref 22–32)
Calcium: 10.2 mg/dL (ref 8.9–10.3)
Chloride: 102 mmol/L (ref 98–111)
Creatinine: 0.66 mg/dL (ref 0.61–1.24)
GFR, Estimated: >60 mL/min (ref >60)
Glucose, Bld: 162 mg/dL — ABNORMAL HIGH (ref 70–99)
Potassium: 4.2 mmol/L (ref 3.5–5.1)
Sodium: 139 mmol/L (ref 135–145)
Total Bilirubin: 0.3 mg/dL (ref 0.3–1.2)
Total Protein: 7.4 g/dL (ref 6.5–8.1)

## 2020-10-02 LAB — CBC WITH DIFFERENTIAL (CANCER CENTER ONLY)
Abs Immature Granulocytes: 0.06 10*3/uL (ref 0.00–0.07)
Basophils Absolute: 0 10*3/uL (ref 0.0–0.1)
Basophils Relative: 0 %
Eosinophils Absolute: 0 10*3/uL (ref 0.0–0.5)
Eosinophils Relative: 0 %
HCT: 31.3 % — ABNORMAL LOW (ref 39.0–52.0)
Hemoglobin: 10 g/dL — ABNORMAL LOW (ref 13.0–17.0)
Immature Granulocytes: 1 %
Lymphocytes Relative: 4 %
Lymphs Abs: 0.5 10*3/uL — ABNORMAL LOW (ref 0.7–4.0)
MCH: 27.9 pg (ref 26.0–34.0)
MCHC: 31.9 g/dL (ref 30.0–36.0)
MCV: 87.2 fL (ref 80.0–100.0)
Monocytes Absolute: 0.9 10*3/uL (ref 0.1–1.0)
Monocytes Relative: 7 %
Neutro Abs: 10.7 10*3/uL — ABNORMAL HIGH (ref 1.7–7.7)
Neutrophils Relative %: 88 %
Platelet Count: 317 10*3/uL (ref 150–400)
RBC: 3.59 MIL/uL — ABNORMAL LOW (ref 4.22–5.81)
RDW: 15.9 % — ABNORMAL HIGH (ref 11.5–15.5)
WBC Count: 12.2 10*3/uL — ABNORMAL HIGH (ref 4.0–10.5)
nRBC: 0 % (ref 0.0–0.2)

## 2020-10-02 MED ORDER — MORPHINE SULFATE (PF) 2 MG/ML IV SOLN
2.0000 mg | Freq: Once | INTRAVENOUS | Status: AC
  Administered 2020-10-02: 16:00:00 2 mg via INTRAVENOUS

## 2020-10-02 MED ORDER — MORPHINE SULFATE (PF) 2 MG/ML IV SOLN
INTRAVENOUS | Status: AC
  Filled 2020-10-02: qty 1

## 2020-10-02 MED ORDER — HEPARIN SOD (PORK) LOCK FLUSH 100 UNIT/ML IV SOLN
500.0000 [IU] | Freq: Once | INTRAVENOUS | Status: AC
Start: 2020-10-02 — End: 2020-10-02
  Administered 2020-10-02: 16:00:00 500 [IU]
  Filled 2020-10-02: qty 5

## 2020-10-02 MED ORDER — SODIUM CHLORIDE 0.9% FLUSH
10.0000 mL | Freq: Once | INTRAVENOUS | Status: AC
  Administered 2020-10-02: 16:00:00 10 mL
  Filled 2020-10-02: qty 10

## 2020-10-02 NOTE — Patient Instructions (Signed)
Morphine injection solution What is this medicine? MORPHINE (MOR feen) is a pain reliever. It is used to treat moderate to severe pain. This medicine may be used for other purposes; ask your health care provider or pharmacist if you have questions. COMMON BRAND NAME(S): Astramorph PF, Duramorph, Duramorph PF, Infumorph, MITIGO What should I tell my health care provider before I take this medicine? They need to know if you have any of these conditions:  brain tumor  drug abuse or addiction  head injury  heart disease  if you often drink alcohol  liver disease  lung or breathing disease, like asthma  problems urinating  seizures  stomach or intestine problems  taken an MAOI like Carbex, Eldepryl, Marplan, Nardil, or Parnate in the last 14 days  an unusual or allergic reaction to morphine, other medications, foods, dyes, or preservatives  pregnant or trying to get pregnant  breast-feeding How should I use this medicine? This medicine is for injection into a muscle, vein, or under the skin. It is usually given by a health care professional in a hospital or clinic setting. If you get this medicine at home, you will be taught how to prepare and give this medicine. Use exactly as directed. Take your medicine at regular intervals. Do not take your medicine more often than directed. Always look at your medicine before using it. Do not use the injection if its color is darker than pale yellow or if it is discolored in any other way. Do not use this medicine if it is cloudy, thickened, colored, or has solid particles in it. It is important that you put your used needles and syringes in a special sharps container. Do not put them in a trash can. If you do not have a sharps container, call your pharmacist or healthcare provider to get one. Talk to your pediatrician regarding the use of this medicine in children. Special care may be needed. Overdosage: If you think you have taken too much  of this medicine contact a poison control center or emergency room at once. NOTE: This medicine is only for you. Do not share this medicine with others. What if I miss a dose? If you miss a dose, take it as soon as you can. If it is almost time for your next dose, take only that dose. Do not take double or extra doses. What may interact with this medicine? This medicine may interact with the following medications:  alcohol  antihistamines for allergy, cough and cold  atropine  certain medicines for anxiety or sleep  certain medicines for bladder problems like oxybutynin, tolterodine  certain medicines for depression like amitriptyline, fluoxetine, sertraline  certain medicines for Parkinson's disease like benztropine, trihexyphenidyl  certain medicines for seizures like phenobarbital, primidone  certain medicines for stomach problems like dicyclomine, hyoscyamine  certain medicines for travel sickness like scopolamine  cimetidine  general anesthetics like halothane, isoflurane, methoxyflurane, propofol  ipratropium  local anesthetics like lidocaine, pramoxine, tetracaine  MAOIs like Carbex, Eldepryl, Marplan, Nardil, and Parnate  medicines that relax muscles for surgery  other narcotic medicines for pain or cough  phenothiazines like chlorpromazine, mesoridazine, prochlorperazine, thioridazine This list may not describe all possible interactions. Give your health care provider a list of all the medicines, herbs, non-prescription drugs, or dietary supplements you use. Also tell them if you smoke, drink alcohol, or use illegal drugs. Some items may interact with your medicine. What should I watch for while using this medicine? Tell your health care provider  if your pain does not go away, if it gets worse, or if you have new or a different type of pain. You may develop tolerance to this drug. Tolerance means that you will need a higher dose of the drug for pain relief.  Tolerance is normal and is expected if you take this drug for a long time. There are different types of narcotic drugs (opioids) for pain. If you take more than one type at the same time, you may have more side effects. Give your health care provider a list of all drugs you use. He or she will tell you how much drug to take. Do not take more drug than directed. Get emergency help right away if you have problems breathing. Do not suddenly stop taking your drug because you may develop a severe reaction. Your body becomes used to the drug. This does NOT mean you are addicted. Addiction is a behavior related to getting and using a drug for a nonmedical reason. If you have pain, you have a medical reason to take pain drug. Your health care provider will tell you how much drug to take. If your health care provider wants you to stop the drug, the dose will be slowly lowered over time to avoid any side effects. Talk to your health care provider about naloxone and how to get it. Naloxone is an emergency drug used for an opioid overdose. An overdose can happen if you take too much opioid. It can also happen if an opioid is taken with some other drugs or substances, like alcohol. Know the symptoms of an overdose, like trouble breathing, unusually tired or sleepy, or not being able to respond or wake up. Make sure to tell caregivers and close contacts where it is stored. Make sure they know how to use it. After naloxone is given, you must get emergency help right away. Naloxone is a temporary treatment. Repeat doses may be needed. You may get drowsy or dizzy. Do not drive, use machinery, or do anything that needs mental alertness until you know how this drug affects you. Do not stand up or sit up quickly, especially if you are an older patient. This reduces the risk of dizzy or fainting spells. Alcohol may interfere with the effect of this drug. Avoid alcoholic drinks. This drug will cause constipation. If you do not have  a bowel movement for 3 days, call your health care provider. Your mouth may get dry. Chewing sugarless gum or sucking hard candy and drinking plenty of water may help. Contact your health care provider if the problem does not go away or is severe. What side effects may I notice from receiving this medicine? Side effects that you should report to your doctor or health care professional as soon as possible:  allergic reactions like skin rash, itching or hives, swelling of the face, lips, or tongue  breathing problems  confusion  seizures  signs and symptoms of low blood pressure like dizziness; feeling faint or lightheaded, falls; unusually weak or tired  trouble passing urine or change in the amount of urine Side effects that usually do not require medical attention (report to your doctor or health care professional if they continue or are bothersome):  constipation  dry mouth  nausea, vomiting  tiredness This list may not describe all possible side effects. Call your doctor for medical advice about side effects. You may report side effects to FDA at 1-800-FDA-1088. Where should I keep my medicine? Keep out of the  reach of children. This medicine can be abused. Keep it in a safe place to protect it from theft. Do not share this medicine with anyone. Selling or giving away this medicine is dangerous and is against the law. If you are using this medicine at home, you will be instructed on how to store this medicine. Throw away any unused medicine after the expiration date on the label. Discard unused medicine and used packaging carefully. Pets and children can be harmed if they find used or lost packages. NOTE: This sheet is a summary. It may not cover all possible information. If you have questions about this medicine, talk to your doctor, pharmacist, or health care provider.  2020 [employer] (2019-05-14 10:27:39)

## 2020-10-02 NOTE — Progress Notes (Signed)
[facility] OFFICE PROGRESS NOTE  [name], MD [place] [address]  DIAGNOSIS: Stage IV (T2b, N3, M1C) non-small cell lung cancer, squamous cell carcinoma presented with right upper lobe lung mass in addition to mediastinal and right supraclavicular lymphadenopathy as well as multiple metastatic bone lesions diagnosed in March 2021.  PRIOR THERAPY:  1) Palliative radiotherapy to the painful metastatic bone lesions under the care of Dr. Sondra Come. Last dose 01/14/20 2) Palliative systemic chemotherapy withcarboplatin for AUC of 5, paclitaxel 175 NG/M2 and Keytruda 200 mg IV every 3 weeks with Neulasta support.Last dose on 09/09/20. Status post9cycles.Starting from cycle #5 he will be on maintenance single agent Keytruda  CURRENT THERAPY: Palliative systemic chemotherapy with docetaxel 75 mg per metered squared and Cyramza 10 mg/kg IV every 3 weeks with Neulasta support.  First dose expected on 10/03/20.   INTERVAL HISTORY: Mike [name] 54 y.o. male returns to the clinic today for a follow up visit accompanied by his wife. The patient is feeling fair today. The patient's most recent CT scan from 09/08/20 showed evidence of disease progression with thoracic adenopathy, right upper lobe mass, and widespread scattered lytic osseous lesions and progressive hepatic metastatic disease. His treatment was subsequently changed to docetaxel and Cyramza. He is status post 1 cycle and tolerated it fair. Due to insurance, he receives his neulasta injections and self administers at home. He administered this earlier this week.  The patient's main concerns today are related to bilateral lower extremity swelling, dry mouth, dark urine, a dull headache, and mouth sores. He also has persistent shoulder pain. He sees orthopedics for his shoulder pain. No clear etiology of his shoulder pain has been appreciated on imaging studies. He is going to make a follow up  appointment with ortho for consideration of a joint injection.   Regarding the bilateral lower extremity swelling, the patient's labs demonstrate low albumin. His swelling is pitting. No calf pain. He is taking eliquis presently. His swelling improves somewhat with elevation, although it is challenging for him to elevate his legs due to his should pain. He is going to buy compression stockings.   Regarding the dry mouth and dark urine, he states he is not drinking enough water. No visible blood. No elevated bilirubin on labs.   For the headache, he rates his pain a 2-3/10. He states this occurs mostly in the AM but resolves in the afternoon. His headache does not wake him up from sleep. He denies any associated visual changes or dizziness. He characterizes his headache as "dull". No nausea or vomiting.    Otherwise, the patient denies any recent fever, chills, or night sweats. It appears that the patient lost 6 lbs since his last appointment. His pain regimen was changed yesterday. His dose of fentanyl was increased to 75 mcg. He also needs a refill of his percocet for breakthrough pain. His breathing is stable. He denies chest pain. He states his cough is stable and that he coughs "now and then". His shortness of breath is stable. He is here for evaluation and a 1 week follow up visit.    MEDICAL HISTORY: Past Medical History:  Diagnosis Date  . Arthritis    neck  . Cancer, metastatic to bone ([place]) dx'd 10/2019  . Diabetes ([place])   . Fatty liver 12/05/2019   By [place] 11/2019  . GERD (gastroesophageal reflux disease)   . 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  High cholesterol   . IBS (irritable bowel syndrome)   . lung ca dx'd 10/2019   lung stage 4   . OSA (obstructive sleep apnea) 09/15/2019   Sleep study 12/2017 - severe OSA with AHI 65.6, desat to 76% rec CPAP  . Severe obesity (BMI 35.0-39.9) with comorbidity ([place]) 09/04/2019    ALLERGIES:  has No Known Allergies.  MEDICATIONS:   Current Outpatient Medications  Medication Sig Dispense Refill  . acetaminophen (TYLENOL) 325 MG tablet Take 1-2 tablets (325-650 mg total) by mouth every 6 (six) hours as needed for mild pain (pain score 1-3 or temp > 100.5).    [name] albuterol (PROVENTIL) (2.5 MG/3ML) 0.083% nebulizer solution Take 3 mLs (2.5 mg total) by nebulization every 6 (six) hours as needed for wheezing or shortness of breath. 75 mL 12  . atorvastatin (LIPITOR) 20 MG tablet Take 1 tablet (20 mg total) by mouth at bedtime. 90 tablet 3  . dexamethasone (DECADRON) 4 MG tablet Take 2 tablets TWICE a day the day before, the day of, and the day after chemotherapy. 60 tablet 2  . diazepam (VALIUM) 5 MG tablet Take 1 tablet (5 mg total) by mouth every 12 (twelve) hours as needed for anxiety or muscle spasms. 30 tablet 0  . docusate sodium (COLACE) 100 MG capsule Take 1 capsule (100 mg total) by mouth 2 (two) times daily. 10 capsule 0  . ELIQUIS 5 MG TABS tablet TAKE 1 TABLET BY MOUTH TWICE A DAY 60 tablet 3  . fentaNYL (DURAGESIC) 50 MCG/HR Place 1 patch onto the skin every 3 (three) days. 5 patch 0  . furosemide (LASIX) 20 MG tablet Take 1 tablet (20 mg total) by mouth daily. 5 tablet 0  . metFORMIN (GLUCOPHAGE) 1000 MG tablet Take 1 tablet (1,000 mg total) by mouth 2 (two) times daily with a meal. 180 tablet 0  . methocarbamol (ROBAXIN) 500 MG tablet Take 1 tablet (500 mg total) by mouth every 6 (six) hours as needed for muscle spasms. 60 tablet 6  . morphine (MS CONTIN) 60 MG 12 hr tablet Take 1 tablet (60 mg total) by mouth every 12 (twelve) hours. 6 tablet 0  . Oxycodone HCl 10 MG TABS Take 1 tablet (10 mg total) by mouth every 3 (three) hours as needed (for severe breakthrough pain). 60 tablet 0  . oxyCODONE-acetaminophen (PERCOCET) 5-325 MG tablet Take 1-2 tablets by mouth every 6 (six) hours as needed for moderate pain. 40 tablet 0  . sildenafil (REVATIO) 20 MG tablet Take 1-5 tablets (20-100 mg total) by mouth daily as needed  (ED). (Patient taking differently: Take 40 mg by mouth daily as needed (ED).) 30 tablet 3   No current facility-administered medications for this visit.    SURGICAL HISTORY:  Past Surgical History:  Procedure Laterality Date  . BIOPSY  12/18/2019   Procedure: BIOPSY;  Surgeon: Garner Nash, DO;  Location: [facility];  Service: Pulmonary;;  . BRONCHIAL BRUSHINGS  12/18/2019   Procedure: BRONCHIAL BRUSHINGS;  Surgeon: Garner Nash, DO;  Location: [facility];  Service: Pulmonary;;  . BRONCHIAL WASHINGS  12/18/2019   Procedure: BRONCHIAL WASHINGS;  Surgeon: Garner Nash, DO;  Location: [facility];  Service: Pulmonary;;  . COLONOSCOPY  10/2019   mult TA, int hem, rpt 1 yr ([facility])  . CYSTECTOMY     knee- left knee cyst  . ENDOBRONCHIAL ULTRASOUND  12/18/2019   Procedure: ENDOBRONCHIAL ULTRASOUND;  Surgeon: Garner Nash, DO;  Location: [facility];  Service: Pulmonary;;  . FINE NEEDLE ASPIRATION  12/18/2019   Procedure: FINE NEEDLE ASPIRATION;  Surgeon: Garner Nash, DO;  Location: [facility];  Service: Pulmonary;;  . INTRAMEDULLARY (IM) NAIL INTERTROCHANTERIC Left 07/25/2020   Procedure: LEFT INTERTROCHANTERIC INTRAMEDULLARY (IM) NAIL;  Surgeon: Leandrew Koyanagi, MD;  Location: [facility];  Service: Orthopedics;  Laterality: Left;  . IR IMAGING GUIDED PORT INSERTION  12/27/2019  . PILONIDAL CYST EXCISION    . VIDEO BRONCHOSCOPY WITH ENDOBRONCHIAL ULTRASOUND Right 12/18/2019   Procedure: VIDEO BRONCHOSCOPY;  Surgeon: Garner Nash, DO;  Location: [facility];  Service: Pulmonary;  Laterality: Right;    REVIEW OF SYSTEMS:   Review of Systems  Constitutional: Positive for weight loss, decreased appetite and fatigue. Negative for chills and fever.  HENT: Positive for mouth sores. Negative for mouth nosebleeds, sore throat and trouble swallowing.   Eyes: Negative for eye problems and icterus.  Respiratory: Positive for stable shortness of breath and cough. Negative for  hemoptysis and wheezing.   Cardiovascular: Positive for bilateral lower extremity pitting edema. negative for chest pain.  Gastrointestinal: Positive for constipation (improved). Negative for abdominal pain, diarrhea, nausea and vomiting.  Genitourinary: Positive for dark urine. Negative for bladder incontinence, difficulty urinating, dysuria, frequency and hematuria.   Musculoskeletal: Positive for diffuse bone pain particularly in hip, back, left neck/left shoulder. Negative for neck stiffness.  Skin: Negative for itching and rash.  Neurological: Positive for dull headache. Negative for dizziness, extremity weakness, gait problem, light-headedness and seizures.  Hematological: Negative for adenopathy. Does not bruise/bleed easily.  Psychiatric/Behavioral: Negative for confusion, depression and sleep disturbance. The patient is not nervous/anxious.     PHYSICAL EXAMINATION:  There were no vitals taken for this visit.  ECOG PERFORMANCE STATUS: 1-2  Physical Exam  Constitutional: Oriented to person, place, and time and well-developed, well-nourished, and in no distress.  HENT:  Head: Normocephalic and atraumatic.  Eyes: Conjunctivae are normal. Right eye exhibits no discharge. Left eye exhibits no discharge. No scleral icterus.  Neck: Normal range of motion. Neck supple.  Cardiovascular: Normal rate, regular rhythm, normal heart sounds and intact distal pulses.  Pulmonary/Chest: Effort normal and breath sounds normal. No respiratory distress. No wheezes. No rales.  Abdominal: Soft. Bowel sounds are normal. Exhibits no distension and no mass. There is no tenderness.  Musculoskeletal: Lower extremity pitting edema bilateral.Normal range of motion.  Lymphadenopathy:  No cervical adenopathy.  Neurological: Alert and oriented to person, place, and time. Exhibits normal muscle tone. The patient was examined in the wheelchair. Skin: Skin is warm and dry. No rash noted. Not diaphoretic. No  erythema. No pallor.  Psychiatric: Mood, memory and judgment normal.  Vitals reviewed.  LABORATORY DATA: Lab Results  Component Value Date   WBC 4.7 09/25/2020   HGB 10.5 (L) 09/25/2020   HCT 33.2 (L) 09/25/2020   MCV 89.7 09/25/2020   PLT 274 09/25/2020      Chemistry      Component Value Date/Time   NA 136 09/25/2020 1414   K 4.6 09/25/2020 1414   CL 95 (L) 09/25/2020 1414   CO2 29 09/25/2020 1414   BUN 6 09/25/2020 1414   CREATININE 0.72 09/25/2020 1414      Component Value Date/Time   CALCIUM 9.9 09/25/2020 1414   ALKPHOS 91 09/25/2020 1414   AST 27 09/25/2020 1414   ALT 19 09/25/2020 1414   BILITOT 0.3 09/25/2020 1414       RADIOGRAPHIC STUDIES:  CT Chest W Contrast  Result  Date: 09/09/2020 CLINICAL DATA:  Primary Cancer Type: Lung Imaging Indication: Assess response to therapy Interval therapy since last imaging? Yes Initial Cancer Diagnosis Date: 12/18/2019; Established by: Biopsy-proven Detailed Pathology: Stage IV non-small cell lung cancer, squamous cell carcinoma. Primary Tumor location: Right upper lobe. Skeletal metastases to sternum, right eighth rib, left femoral neck, left ilium, right acetabulum, right side S1. Surgeries: No thoracic. Prophylactic stabilization of impending pathologic fracture of left intertrochanteric femur 07/25/2020. Chemotherapy: Yes; Ongoing? No; Most recent administration: 04/02/2020 Immunotherapy?  Yes; Type: Keytruda; Ongoing? Yes Radiation therapy? Yes Date Range: 08/15/2020 - 08/28/2020; Target: Left pelvis Date Range: 07/07/2020 - 07/16/2020; Target: Right lung, right ribs, right pelvis Date Range: 01/01/2020 - 01/14/2020; Target: Right chest, right ribs, right pelvis EXAM: CT CHEST, ABDOMEN, AND PELVIS WITH CONTRAST TECHNIQUE: Multidetector CT imaging of the chest, abdomen and pelvis was performed following the standard protocol during bolus administration of intravenous contrast. CONTRAST:  177mL OMNIPAQUE IOHEXOL 300 MG/ML  SOLN  COMPARISON:  Multiple exams, including CT chest, abdomen and pelvis 06/06/2020 and 12/20/2019 PET-CT. FINDINGS: CT CHEST FINDINGS Cardiovascular: Coronary, aortic arch, and branch vessel atherosclerotic vascular disease. Stable appearance of web like peripheral filling defect best seen in the left lower lobe pulmonary artery on image 29 of series 2 compatible with remote/chronic pulmonary embolus. Mediastinum/Nodes: Left supraclavicular node 1.6 cm in short axis on image 9 series 2, previously 0.6 cm. AP window lymph node 1.2 cm in short axis on image 22 series 2, previously 0.5 cm. Right hilar node 1.8 cm in short axis on image 27 series 2, previously the same. Right upper paratracheal/paraesophageal lymph node 1.2 cm in short axis on image 10 series 2, previously 1.3 cm. Stable subcarinal lymph node. Lungs/Pleura: Right upper lobe mass 3.6 by 2.1 cm on image 54 of series 6, previously 3.0 by 1.2 cm by my measurements. Right lower lobe sub solid peribronchovascular nodule 1.1 cm in diameter on image 88 of series 6, new compared to previous. Airway thickening is present, suggesting bronchitis or reactive airways disease. New trace right pleural effusion. Musculoskeletal: Progressive prominent lytic lesion of the left medial clavicle. Small lytic lesion of the left anterior humeral head on image 8 of series 6, new compared to previous. Worsening lytic component of the lytic and sclerotic sternal lesion, lytic component currently 2.6 by 1.6 cm on image 134 of series 5, and previously 1.0 by 0.6 cm. Stable sclerotic lesion of the right lateral eighth rib. A large lytic lesion of the right medial eleventh rib partially scallops the T11 vertebral body, and measures 5.2 by 2.9 cm on image 53 of series 2, previously 1.4 by 1.0 cm. CT ABDOMEN PELVIS FINDINGS Hepatobiliary: Newly appreciable hypoenhancing mass measuring 3.0 by 2.1 cm in the right hepatic lobe on image 65 of series 2. Scalloped inferior contour of the right  hepatic lobe adjacent to a 2.3 by 1.0 cm inferior right hepatic lobe subcapsular lesion on image 80 of series 4. Gallbladder unremarkable. No biliary dilatation. Pancreas: Unremarkable Spleen: Unremarkable Adrenals/Urinary Tract: Unremarkable Stomach/Bowel: Indistinct density at the anorectal junction is nonspecific and probably incidental for example on image 131 of series 2, correlate with any rectal symptoms. There is no specific abnormality in this vicinity on prior CT. Vascular/Lymphatic: Small but increased periaortic lymph nodes are present including a 0.7 cm left periaortic node on image 75 of series 2, previously 0.3 cm. Right external iliac node enlarged at 1.4 cm in short axis on image 108 of series 2, previously 0.3 cm.  Aortoiliac atherosclerotic vascular disease. Reproductive: Unremarkable Other: Mild presacral edema. Musculoskeletal: New and progressive osseous metastatic lesions of the right upper sacrum, the left upper iliac bone, the right anterior iliac bone, the left lower sacrum, the posterior wall the right acetabulum, and the left posterior femoral neck and greater trochanter. Since the prior CT there is been left hip ORIF. IMPRESSION: 1. Substantial worsening/progression including the thoracic adenopathy, right upper lobe mass, widespread scattered lytic metastatic disease, and hepatic metastatic disease as detailed above. 2. Stable appearance of chronic pulmonary embolus best seen in the left lower lobe pulmonary artery. 3. Airway thickening is present, suggesting bronchitis or reactive airways disease. 4. New trace right pleural effusion. 5. Indistinct density at the anorectal junction, probably incidental, less likely due to an underlying mass. 6.  Aortic Atherosclerosis (ICD10-I70.0). Electronically Signed   By: Van Clines M.D.   On: 09/09/2020 10:27   CT Abdomen Pelvis W Contrast  Result Date: 09/09/2020 CLINICAL DATA:  Primary Cancer Type: Lung Imaging Indication: Assess  response to therapy Interval therapy since last imaging? Yes Initial Cancer Diagnosis Date: 12/18/2019; Established by: Biopsy-proven Detailed Pathology: Stage IV non-small cell lung cancer, squamous cell carcinoma. Primary Tumor location: Right upper lobe. Skeletal metastases to sternum, right eighth rib, left femoral neck, left ilium, right acetabulum, right side S1. Surgeries: No thoracic. Prophylactic stabilization of impending pathologic fracture of left intertrochanteric femur 07/25/2020. Chemotherapy: Yes; Ongoing? No; Most recent administration: 04/02/2020 Immunotherapy?  Yes; Type: Keytruda; Ongoing? Yes Radiation therapy? Yes Date Range: 08/15/2020 - 08/28/2020; Target: Left pelvis Date Range: 07/07/2020 - 07/16/2020; Target: Right lung, right ribs, right pelvis Date Range: 01/01/2020 - 01/14/2020; Target: Right chest, right ribs, right pelvis EXAM: CT CHEST, ABDOMEN, AND PELVIS WITH CONTRAST TECHNIQUE: Multidetector CT imaging of the chest, abdomen and pelvis was performed following the standard protocol during bolus administration of intravenous contrast. CONTRAST:  160mL OMNIPAQUE IOHEXOL 300 MG/ML  SOLN COMPARISON:  Multiple exams, including CT chest, abdomen and pelvis 06/06/2020 and 12/20/2019 PET-CT. FINDINGS: CT CHEST FINDINGS Cardiovascular: Coronary, aortic arch, and branch vessel atherosclerotic vascular disease. Stable appearance of web like peripheral filling defect best seen in the left lower lobe pulmonary artery on image 29 of series 2 compatible with remote/chronic pulmonary embolus. Mediastinum/Nodes: Left supraclavicular node 1.6 cm in short axis on image 9 series 2, previously 0.6 cm. AP window lymph node 1.2 cm in short axis on image 22 series 2, previously 0.5 cm. Right hilar node 1.8 cm in short axis on image 27 series 2, previously the same. Right upper paratracheal/paraesophageal lymph node 1.2 cm in short axis on image 10 series 2, previously 1.3 cm. Stable subcarinal lymph node.  Lungs/Pleura: Right upper lobe mass 3.6 by 2.1 cm on image 54 of series 6, previously 3.0 by 1.2 cm by my measurements. Right lower lobe sub solid peribronchovascular nodule 1.1 cm in diameter on image 88 of series 6, new compared to previous. Airway thickening is present, suggesting bronchitis or reactive airways disease. New trace right pleural effusion. Musculoskeletal: Progressive prominent lytic lesion of the left medial clavicle. Small lytic lesion of the left anterior humeral head on image 8 of series 6, new compared to previous. Worsening lytic component of the lytic and sclerotic sternal lesion, lytic component currently 2.6 by 1.6 cm on image 134 of series 5, and previously 1.0 by 0.6 cm. Stable sclerotic lesion of the right lateral eighth rib. A large lytic lesion of the right medial eleventh rib partially scallops the T11 vertebral body,  and measures 5.2 by 2.9 cm on image 53 of series 2, previously 1.4 by 1.0 cm. CT ABDOMEN PELVIS FINDINGS Hepatobiliary: Newly appreciable hypoenhancing mass measuring 3.0 by 2.1 cm in the right hepatic lobe on image 65 of series 2. Scalloped inferior contour of the right hepatic lobe adjacent to a 2.3 by 1.0 cm inferior right hepatic lobe subcapsular lesion on image 80 of series 4. Gallbladder unremarkable. No biliary dilatation. Pancreas: Unremarkable Spleen: Unremarkable Adrenals/Urinary Tract: Unremarkable Stomach/Bowel: Indistinct density at the anorectal junction is nonspecific and probably incidental for example on image 131 of series 2, correlate with any rectal symptoms. There is no specific abnormality in this vicinity on prior CT. Vascular/Lymphatic: Small but increased periaortic lymph nodes are present including a 0.7 cm left periaortic node on image 75 of series 2, previously 0.3 cm. Right external iliac node enlarged at 1.4 cm in short axis on image 108 of series 2, previously 0.3 cm. Aortoiliac atherosclerotic vascular disease. Reproductive: Unremarkable  Other: Mild presacral edema. Musculoskeletal: New and progressive osseous metastatic lesions of the right upper sacrum, the left upper iliac bone, the right anterior iliac bone, the left lower sacrum, the posterior wall the right acetabulum, and the left posterior femoral neck and greater trochanter. Since the prior CT there is been left hip ORIF. IMPRESSION: 1. Substantial worsening/progression including the thoracic adenopathy, right upper lobe mass, widespread scattered lytic metastatic disease, and hepatic metastatic disease as detailed above. 2. Stable appearance of chronic pulmonary embolus best seen in the left lower lobe pulmonary artery. 3. Airway thickening is present, suggesting bronchitis or reactive airways disease. 4. New trace right pleural effusion. 5. Indistinct density at the anorectal junction, probably incidental, less likely due to an underlying mass. 6.  Aortic Atherosclerosis (ICD10-I70.0). Electronically Signed   By: Van Clines M.D.   On: 09/09/2020 10:27   XR HIP UNILAT W OR W/O PELVIS 2-3 VIEWS LEFT  Result Date: 09/19/2020 Stable alignment of the hardware without interval change    ASSESSMENT/PLAN:  This is a very pleasant 49 year oldCaucasianmale recently diagnosed with a stage IV (T2b, N3, M1C) non-small cell lung cancer, squamous cell carcinoma presented with right upper lobe lung mass in addition to mediastinal and right supraclavicular lymphadenopathy as well as multiple metastatic bone lesions diagnosed in March 2021.  The patientcompletedpalliative radiotherapy to the painful bone lesions under the care of Dr.Kinard.Last treatment on 01/14/20.  The patient had a stabilization of the impending pathological fracture of the left hip in October 2021.  The patient underwent palliative systemic chemotherapy with carboplatin for an AUC of 5, paclitaxel 175 mg per metered squared, Keytruda 200 mg IV every 3 weeks with Neulasta support. He is status post 9  cycles. This was discontinued due to evidence for disease progression.   The patient recently had evidence for disease progression. His treatment was switched to docetaxel 75 mg/m2 and Cyramza 10 mg/kg IV every 3 weeks with Neulasta support. He is status post 1 cycle and tolerated fair except for fatigue and mouth sores.   The patient was seen with Dr. Julien Nordmann. Labs were reviewed. Recommend he continue on the same treatment at the same dose   We will see him back for a follow up visit in 2 weeks for evaluation before starting cycle #2.   He was given a refill of his oxycodone for break through pain to take 1-2 tablets every 6 hours as needed for pain. His dose of fentanyl was increased yesterday to 75 mcg. He  will pick this up on the way home today. He was also given a prescription for magic mouth wash for his mouth soreness. He was also encouraged to use salt water rinses and biotene. He was encouraged to stay hydrated for his dark urine and dry mouth. His bilirubin was WNL on lab work today. His hemoglobin was fairly stable as well.   Regarding his bilateral lower extremity swelling. No calf pain, erythema, or tenderness. Exam showed bilateral pitting edema. Labs showed low albumin. Low suspicion for DVT given that he is currently on a blood thinner. Likely secondary to low albumin. The patient was encouraged to increase his protein intake. Cautioned that supplemental protein drinks often have a lot of sugar and that he may need to purchase diabetic supplemental protein drinks. He was also encouraged to elevate his legs and use compression stockings.   Regarding his headaches, Dr. Julien Nordmann would like to hold on getting neuroimaging for now given his mild headache that resolves spontaneously. If he continues to have persistent headaches, we may consider a CT of the head to rule out metastatic disease in the future.   The patient was advised to call immediately if he has any concerning symptoms in the  interval. The patient voices understanding of current disease status and treatment options and is in agreement with the current care plan. All questions were answered. The patient knows to call the clinic with any problems, questions or concerns. We can certainly see the patient much sooner if necessary  No orders of the defined types were placed in this encounter.    Momo Braun L Albina Gosney, PA-C 10/02/20  ADDENDUM: Hematology/Oncology Attending: I had a face-to-face encounter with the patient today.  I recommended his care plan.  This is a very pleasant 54 years old white male with metastatic non-small cell lung cancer, squamous cell carcinoma status post 4 cycles of induction systemic chemotherapy with carboplatin, paclitaxel and Keytruda followed by maintenance Keytruda for 5 more cycles.  His treatment was discontinued secondary to disease progression. The patient is started last week the first dose of second line chemotherapy with docetaxel and Cyramza with Neulasta support.  He tolerated the first week of his treatment fairly well.  He continues to have more pain especially in the shoulder area and we increase his fentanyl patch to 75 mcg/hour every 3 days in addition to the oxycodone for breakthrough pain.  Also has some mouth sores and will start the patient on Magic mouthwash. The patient was also encouraged to increase his oral intake and nutrition because of the low albumin level. We will see him back for follow-up visit in around 2 weeks for evaluation before starting cycle #2. He was advised to call immediately if he has any other concerning symptoms in the interval.  Disclaimer: This note was dictated with voice recognition software. Similar sounding words can inadvertently be transcribed and may be missed upon review. Eilleen Kempf, MD 10/08/20

## 2020-10-02 NOTE — Patient Instructions (Signed)

## 2020-10-02 NOTE — Progress Notes (Signed)
DISCONTINUE ON PATHWAY REGIMEN - Non-Small Cell Lung     A cycle is every 21 days:     Pembrolizumab      Paclitaxel      Carboplatin   **Always confirm dose/schedule in your pharmacy ordering system**  REASON: Disease Progression PRIOR TREATMENT: [id number]: Pembrolizumab 200 mg + Carboplatin AUC=6 + Paclitaxel 200 mg/m2 q21 Days x 4 Cycles TREATMENT RESPONSE: Progressive Disease (PD)  START ON PATHWAY REGIMEN - Non-Small Cell Lung     A cycle is every 21 days:     Ramucirumab      Docetaxel   **Always confirm dose/schedule in your pharmacy ordering system**  Patient Characteristics: Stage IV Metastatic, Squamous, PS = 0, 1, Second Line, Prior PD-1/PD-L1 Inhibitor + Chemotherapy or No Prior PD-1/PD-L1 Inhibitor, and Not a Candidate for Immunotherapy Therapeutic Status: Stage IV Metastatic Histology: Squamous Cell Line of therapy: Second Line ECOG Performance Status: 1 PD-L1 Expression Status: Awaiting Test Results Immunotherapy Candidate Status: Not a Candidate for Immunotherapy Prior Immunotherapy Status: Prior PD-1/PD-L1 Inhibitor + Chemotherapy Intent of Therapy: Non-Curative / Palliative Intent, Discussed with Patient

## 2020-10-02 NOTE — Progress Notes (Signed)
OK to treat with elevated HR per Sandi Mealy, PA

## 2020-10-03 ENCOUNTER — Other Ambulatory Visit: Payer: Self-pay | Admitting: Physician Assistant

## 2020-10-03 ENCOUNTER — Inpatient Hospital Stay: Payer: 59

## 2020-10-03 VITALS — BP 128/82 | HR 97 | Temp 97.7°F | Resp 18

## 2020-10-03 DIAGNOSIS — C3491 Malignant neoplasm of unspecified part of right bronchus or lung: Secondary | ICD-10-CM

## 2020-10-03 DIAGNOSIS — G893 Neoplasm related pain (acute) (chronic): Secondary | ICD-10-CM

## 2020-10-03 DIAGNOSIS — Z5112 Encounter for antineoplastic immunotherapy: Secondary | ICD-10-CM | POA: Diagnosis not present

## 2020-10-03 LAB — TOTAL PROTEIN, URINE DIPSTICK

## 2020-10-03 MED ORDER — PEGFILGRASTIM-BMEZ 6 MG/0.6ML ~~LOC~~ SOSY: 6.0000 mg | PREFILLED_SYRINGE | Freq: Once | SUBCUTANEOUS | 10 refills | Status: AC

## 2020-10-03 MED ORDER — ACETAMINOPHEN 325 MG PO TABS
ORAL_TABLET | ORAL | Status: AC
  Filled 2020-10-03: qty 2

## 2020-10-03 MED ORDER — SODIUM CHLORIDE 0.9 % IV SOLN
Freq: Once | INTRAVENOUS | Status: AC
  Filled 2020-10-03: qty 250

## 2020-10-03 MED ORDER — MORPHINE SULFATE (PF) 2 MG/ML IV SOLN
1.0000 mg | Freq: Once | INTRAVENOUS | Status: AC
  Administered 2020-10-03: 14:00:00 1 mg via INTRAVENOUS

## 2020-10-03 MED ORDER — DIPHENHYDRAMINE HCL 50 MG/ML IJ SOLN
50.0000 mg | Freq: Once | INTRAMUSCULAR | Status: AC
  Administered 2020-10-03: 14:00:00 50 mg via INTRAVENOUS

## 2020-10-03 MED ORDER — SODIUM CHLORIDE 0.9 % IV SOLN
10.0000 mg | Freq: Once | INTRAVENOUS | Status: AC
  Administered 2020-10-03: 15:00:00 10 mg via INTRAVENOUS
  Filled 2020-10-03: qty 10

## 2020-10-03 MED ORDER — HEPARIN SOD (PORK) LOCK FLUSH 100 UNIT/ML IV SOLN
500.0000 [IU] | Freq: Once | INTRAVENOUS | Status: AC | PRN
  Administered 2020-10-03: 18:00:00 500 [IU]
  Filled 2020-10-03: qty 5

## 2020-10-03 MED ORDER — DIPHENHYDRAMINE HCL 50 MG/ML IJ SOLN
INTRAMUSCULAR | Status: AC
  Filled 2020-10-03: qty 1

## 2020-10-03 MED ORDER — ACETAMINOPHEN 325 MG PO TABS
650.0000 mg | ORAL_TABLET | Freq: Once | ORAL | Status: AC
  Administered 2020-10-03: 14:00:00 650 mg via ORAL

## 2020-10-03 MED ORDER — RAMUCIRUMAB CHEMO INJECTION 500 MG/50ML
10.0000 mg/kg | Freq: Once | INTRAVENOUS | Status: AC
  Administered 2020-10-03: 15:00:00 1100 mg via INTRAVENOUS
  Filled 2020-10-03: qty 100

## 2020-10-03 MED ORDER — SODIUM CHLORIDE 0.9 % IV SOLN
75.0000 mg/m2 | Freq: Once | INTRAVENOUS | Status: AC
  Administered 2020-10-03: 17:00:00 180 mg via INTRAVENOUS
  Filled 2020-10-03: qty 18

## 2020-10-03 MED ORDER — SODIUM CHLORIDE 0.9% FLUSH
10.0000 mL | INTRAVENOUS | Status: DC | PRN
  Administered 2020-10-03: 18:00:00 10 mL
  Filled 2020-10-03: qty 10

## 2020-10-03 MED ORDER — MORPHINE SULFATE (PF) 2 MG/ML IV SOLN
INTRAVENOUS | Status: AC
  Filled 2020-10-03: qty 1

## 2020-10-03 NOTE — Patient Instructions (Addendum)
[place] Discharge Instructions for Patients Receiving Chemotherapy  Today you received the following chemotherapy agents: Cyramza/Taxotere.  To help prevent nausea and vomiting after your treatment, we encourage you to take your nausea medication as directed.   If you develop nausea and vomiting that is not controlled by your nausea medication, call the clinic.   BELOW ARE SYMPTOMS THAT SHOULD BE REPORTED IMMEDIATELY:  *FEVER GREATER THAN 100.5 F  *CHILLS WITH OR WITHOUT FEVER  NAUSEA AND VOMITING THAT IS NOT CONTROLLED WITH YOUR NAUSEA MEDICATION  *UNUSUAL SHORTNESS OF BREATH  *UNUSUAL BRUISING OR BLEEDING  TENDERNESS IN MOUTH AND THROAT WITH OR WITHOUT PRESENCE OF ULCERS  *URINARY PROBLEMS  *BOWEL PROBLEMS  UNUSUAL RASH Items with * indicate a potential emergency and should be followed up as soon as possible.  Feel free to call the clinic should you have any questions or concerns. The clinic phone number is [phone].  Please show the [place] at check-in to the Emergency Department and triage nurse.  Ramucirumab injection What is this medicine? RAMUCIRUMAB (ra mue SIR ue mab) is a monoclonal antibody. It is used to treat stomach cancer, colorectal cancer, liver cancer, and lung cancer. This medicine may be used for other purposes; ask your health care provider or pharmacist if you have questions. COMMON BRAND NAME(S): Cyramza What should I tell my health care provider before I take this medicine? They need to know if you have any of these conditions:  bleeding disorders  blood clots  heart disease, including heart failure, heart attack, or chest pain (angina)  high blood pressure  infection (especially a virus infection such as chickenpox, cold sores, or herpes)  protein in your urine  recent or planning to have surgery  stroke  an unusual or allergic reaction to ramucirumab, other medicines, foods, dyes, or  preservatives  pregnant or trying to get pregnant  breast-feeding How should I use this medicine? This medicine is for infusion into a vein. It is given by a health care professional in a hospital or clinic setting. Talk to your pediatrician regarding the use of this medicine in children. Special care may be needed. Overdosage: If you think you have taken too much of this medicine contact a poison control center or emergency room at once. NOTE: This medicine is only for you. Do not share this medicine with others. What if I miss a dose? It is important not to miss your dose. Call your doctor or health care professional if you are unable to keep an appointment. What may interact with this medicine? Interactions have not been studied. This list may not describe all possible interactions. Give your health care provider a list of all the medicines, herbs, non-prescription drugs, or dietary supplements you use. Also tell them if you smoke, drink alcohol, or use illegal drugs. Some items may interact with your medicine. What should I watch for while using this medicine? Your condition will be monitored carefully while you are receiving this medicine. You will need to to check your blood pressure and have your blood and urine tested while you are taking this medicine. Your condition will be monitored carefully while you are receiving this medicine. This medicine may increase your risk to bruise or bleed. Call your doctor or health care professional if you notice any unusual bleeding. Before having surgery, talk to your health care provider to make sure it is ok. This drug can increase the risk of poor healing of your surgical site  or wound. You will need to stop this drug for 28 days before surgery. After surgery, wait at least 2 weeks before restarting this drug. Make sure the surgical site or wound is healed enough before restarting this drug. Talk to your health care provider if questions. Do not  become pregnant while taking this medicine or for 3 months after stopping it. Women should inform their doctor if they wish to become pregnant or think they might be pregnant. There is a potential for serious side effects to an unborn child. Talk to your health care professional or pharmacist for more information. Do not breast-feed an infant while taking this medicine or for 2 months after stopping it. This medicine may interfere with the ability to have a child. Talk with your doctor or health care professional if you are concerned about your fertility. What side effects may I notice from receiving this medicine? Side effects that you should report to your doctor or health care professional as soon as possible:  allergic reactions like skin rash, itching or hives, breathing problems, swelling of the face, lips, or tongue  signs of infection - fever or chills, cough, sore throat  chest pain or chest tightness  confusion  dizziness  feeling faint or lightheaded, falls  severe abdominal pain  severe nausea, vomiting  signs and symptoms of bleeding such as bloody or black, tarry stools; red or dark-brown urine; spitting up blood or brown material that looks like coffee grounds; red spots on the skin; unusual bruising or bleeding from the eye, gums, or nose  signs and symptoms of a blood clot such as breathing problems; changes in vision; chest pain; severe, sudden headache; pain, swelling, warmth in the leg; trouble speaking; sudden numbness or weakness of the face, arm or leg  symptoms of a stroke: change in mental awareness, inability to talk or move one side of the body  trouble walking, dizziness, loss of balance or coordination Side effects that usually do not require medical attention (report to your doctor or health care professional if they continue or are bothersome):  cold, clammy skin  constipation  diarrhea  headache  nausea, vomiting  stomach pain  unusually slow  heartbeat  unusually weak or tired This list may not describe all possible side effects. Call your doctor for medical advice about side effects. You may report side effects to FDA at 1-800-FDA-1088. Where should I keep my medicine? This drug is given in a hospital or clinic and will not be stored at home. NOTE: This sheet is a summary. It may not cover all possible information. If you have questions about this medicine, talk to your doctor, pharmacist, or health care provider.  2020 Elsevier/Gold Standard (2019-08-01 11:17:50)  Docetaxel injection What is this medicine? DOCETAXEL (doe se TAX el) is a chemotherapy drug. It targets fast dividing cells, like cancer cells, and causes these cells to die. This medicine is used to treat many types of cancers like breast cancer, certain stomach cancers, head and neck cancer, lung cancer, and prostate cancer. This medicine may be used for other purposes; ask your health care provider or pharmacist if you have questions. COMMON BRAND NAME(S): Docefrez, Taxotere What should I tell my health care provider before I take this medicine? They need to know if you have any of these conditions:  infection (especially a virus infection such as chickenpox, cold sores, or herpes)  liver disease  low blood counts, like low white cell, platelet, or red cell counts  an unusual or allergic reaction to docetaxel, polysorbate 80, other chemotherapy agents, other medicines, foods, dyes, or preservatives  pregnant or trying to get pregnant  breast-feeding How should I use this medicine? This drug is given as an infusion into a vein. It is administered in a hospital or clinic by a specially trained health care professional. Talk to your pediatrician regarding the use of this medicine in children. Special care may be needed. Overdosage: If you think you have taken too much of this medicine contact a poison control center or emergency room at once. NOTE: This  medicine is only for you. Do not share this medicine with others. What if I miss a dose? It is important not to miss your dose. Call your doctor or health care professional if you are unable to keep an appointment. What may interact with this medicine?  aprepitant  certain antibiotics like erythromycin or clarithromycin  certain antivirals for HIV or hepatitis  certain medicines for fungal infections like fluconazole, itraconazole, ketoconazole, posaconazole, or voriconazole  cimetidine  ciprofloxacin  conivaptan  cyclosporine  dronedarone  fluvoxamine  grapefruit juice  imatinib  verapamil This list may not describe all possible interactions. Give your health care provider a list of all the medicines, herbs, non-prescription drugs, or dietary supplements you use. Also tell them if you smoke, drink alcohol, or use illegal drugs. Some items may interact with your medicine. What should I watch for while using this medicine? Your condition will be monitored carefully while you are receiving this medicine. You will need important blood work done while you are taking this medicine. Call your doctor or health care professional for advice if you get a fever, chills or sore throat, or other symptoms of a cold or flu. Do not treat yourself. This drug decreases your body's ability to fight infections. Try to avoid being around people who are sick. Some products may contain alcohol. Ask your health care professional if this medicine contains alcohol. Be sure to tell all health care professionals you are taking this medicine. Certain medicines, like metronidazole and disulfiram, can cause an unpleasant reaction when taken with alcohol. The reaction includes flushing, headache, nausea, vomiting, sweating, and increased thirst. The reaction can last from 30 minutes to several hours. You may get drowsy or dizzy. Do not drive, use machinery, or do anything that needs mental alertness until you  know how this medicine affects you. Do not stand or sit up quickly, especially if you are an older patient. This reduces the risk of dizzy or fainting spells. Alcohol may interfere with the effect of this medicine. Talk to your health care professional about your risk of cancer. You may be more at risk for certain types of cancer if you take this medicine. Do not become pregnant while taking this medicine or for 6 months after stopping it. Women should inform their doctor if they wish to become pregnant or think they might be pregnant. There is a potential for serious side effects to an unborn child. Talk to your health care professional or pharmacist for more information. Do not breast-feed an infant while taking this medicine or for 1 week after stopping it. Males who get this medicine must use a condom during sex with females who can get pregnant. If you get a woman pregnant, the baby could have birth defects. The baby could die before they are born. You will need to continue wearing a condom for 3 months after stopping the medicine. Tell your health care  provider right away if your partner becomes pregnant while you are taking this medicine. This may interfere with the ability to father a child. You should talk to your doctor or health care professional if you are concerned about your fertility. What side effects may I notice from receiving this medicine? Side effects that you should report to your doctor or health care professional as soon as possible:  allergic reactions like skin rash, itching or hives, swelling of the face, lips, or tongue  blurred vision  breathing problems  changes in vision  low blood counts - This drug may decrease the number of white blood cells, red blood cells and platelets. You may be at increased risk for infections and bleeding.  nausea and vomiting  pain, redness or irritation at site where injected  pain, tingling, numbness in the hands or feet  redness,  blistering, peeling, or loosening of the skin, including inside the mouth  signs of decreased platelets or bleeding - bruising, pinpoint red spots on the skin, black, tarry stools, nosebleeds  signs of decreased red blood cells - unusually weak or tired, fainting spells, lightheadedness  signs of infection - fever or chills, cough, sore throat, pain or difficulty passing urine  swelling of the ankle, feet, hands Side effects that usually do not require medical attention (report to your doctor or health care professional if they continue or are bothersome):  constipation  diarrhea  fingernail or toenail changes  hair loss  loss of appetite  mouth sores  muscle pain This list may not describe all possible side effects. Call your doctor for medical advice about side effects. You may report side effects to FDA at 1-800-FDA-1088. Where should I keep my medicine? This drug is given in a hospital or clinic and will not be stored at home. NOTE: This sheet is a summary. It may not cover all possible information. If you have questions about this medicine, talk to your doctor, pharmacist, or health care provider.  2020 Elsevier/Gold Standard (2019-05-31 10:19:06)

## 2020-10-03 NOTE — Progress Notes (Signed)
[facility] Progress Note   Mike [name] [id number] 1966-02-02 54 y.o.  Mike [name] is managed by Dr. Fanny Bien. Mike [name]  Actively treated with chemotherapy/immunotherapy/hormonal therapy: yes  Current therapy: Beryle Flock now transitioning to docetaxel and Cyramza  Last treated: 09/09/2020 (cycle 9)  Next scheduled appointment with provider: 10/08/2020 Assessment: Plan:    Neoplasm related pain - Plan: morphine 2 MG/ML injection 2 mg, DG Cervical Spine 2-3 Views, DG Shoulder Left, DG Clavicle Left  Stage IV squamous cell carcinoma of right lung (HCC)  Bony metastasis (HCC)   Neoplasm related pain with left neck and left shoulder pain with a history of bony metastasis: The patient was given morphine 2 mg IV x1 prior to x-rays of his left clavicle, left shoulder, and cervical spine.  Stage IV squamous cell carcinoma of the right lung: Mike [name] was to have transition to docetaxel and Cyramza today.  He will however return tomorrow for cycle 1, day 1 of this chemotherapy.  Please see After Visit Summary for patient specific instructions.  Future Appointments  Date Time Provider [place]  10/06/2020  9:30 AM Valente David, RN [facility] None  10/08/2020  1:45 PM [facility] None  10/08/2020  2:00 PM [facility] [place] FLUSH [facility] None  10/08/2020  2:30 PM [name], [name], PA-C [facility] None  10/16/2020 10:45 AM [facility] LAB [facility] None  10/16/2020 11:00 AM [facility] [place] FLUSH [facility] None  10/23/2020 10:15 AM [facility] LAB [facility] None  10/23/2020 10:30 AM [facility] [place] FLUSH [facility] None  10/23/2020 11:00 AM [name], [name], PA-C [facility] None  10/23/2020 12:15 PM [facility] INFUSION [facility] None  10/30/2020 10:45 AM [facility] LAB [facility] None  10/30/2020 11:00 AM [facility] [place] FLUSH [facility] None  11/06/2020 10:45 AM [facility] LAB [facility] None  11/06/2020 11:00 AM [facility]  [place] FLUSH [facility] None  11/13/2020  8:30 AM [facility] LAB [facility] None  11/13/2020  8:45 AM [facility] [place] FLUSH [facility] None  11/13/2020  9:15 AM [name], MD [facility] None  11/13/2020 10:15 AM [facility] INFUSION [facility] None  12/02/2020  8:45 AM [facility] LAB [facility] None  12/02/2020  9:00 AM [facility] [place] FLUSH [facility] None  12/02/2020  9:30 AM [name], MD [facility] None  12/05/2020  8:00 AM [facility] PEC  12/12/2020  2:00 PM [name], MD [facility]    Orders Placed This Encounter  Procedures  . DG Cervical Spine 2-3 Views  . DG Shoulder Left  . DG Clavicle Left       Subjective:   Patient ID:  Mike [name] is a 54 y.o. (DOB November 08, 1965) male.  Chief Complaint:  Chief Complaint  Patient presents with  . Neck Pain    HPI Mike [name]  is a 54 y.o. male with a diagnosis of a stage IV squamous cell carcinoma of the right lung.  He is managed by Dr. Earlie Server and is status post cycle 9 of Keytruda which was dosed on 09/09/2020.  He will be transitioning to docetaxel and Cyramza.  He reports today that he is having pain in his left neck, left clavicle, and left shoulder which is new. He has been taking Robaxin along with his pain medications.  He denies any other issues of concern.  He continues using a 50 mcg fentanyl patch and Percocet for breakthrough pain.  He presents to the clinic today with his wife.  He is ambulating with the use of a wheelchair.  Medications: I have reviewed the patient's  current medications.  Allergies: No Known Allergies  Past Medical History:  Diagnosis Date  . Arthritis    neck  . Cancer, metastatic to bone ([place]) dx'd 10/2019  . Diabetes ([place])   . Fatty liver 12/05/2019   By [place] 11/2019  . GERD (gastroesophageal reflux disease)   . 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 High cholesterol   . IBS (irritable bowel syndrome)   . lung ca dx'd 10/2019   lung stage 4   .  OSA (obstructive sleep apnea) 09/15/2019   Sleep study 12/2017 - severe OSA with AHI 65.6, desat to 76% rec CPAP  . Severe obesity (BMI 35.0-39.9) with comorbidity ([place]) 09/04/2019    Past Surgical History:  Procedure Laterality Date  . BIOPSY  12/18/2019   Procedure: BIOPSY;  Surgeon: Garner Nash, DO;  Location: [facility];  Service: Pulmonary;;  . BRONCHIAL BRUSHINGS  12/18/2019   Procedure: BRONCHIAL BRUSHINGS;  Surgeon: Garner Nash, DO;  Location: [facility];  Service: Pulmonary;;  . BRONCHIAL WASHINGS  12/18/2019   Procedure: BRONCHIAL WASHINGS;  Surgeon: Garner Nash, DO;  Location: [facility];  Service: Pulmonary;;  . COLONOSCOPY  10/2019   mult TA, int hem, rpt 1 yr ([place])  . CYSTECTOMY     knee- left knee cyst  . ENDOBRONCHIAL ULTRASOUND  12/18/2019   Procedure: ENDOBRONCHIAL ULTRASOUND;  Surgeon: Garner Nash, DO;  Location: [facility];  Service: Pulmonary;;  . FINE NEEDLE ASPIRATION  12/18/2019   Procedure: FINE NEEDLE ASPIRATION;  Surgeon: Garner Nash, DO;  Location: [facility];  Service: Pulmonary;;  . INTRAMEDULLARY (IM) NAIL INTERTROCHANTERIC Left 07/25/2020   Procedure: LEFT INTERTROCHANTERIC INTRAMEDULLARY (IM) NAIL;  Surgeon: Leandrew Koyanagi, MD;  Location: [facility];  Service: Orthopedics;  Laterality: Left;  . IR IMAGING GUIDED PORT INSERTION  12/27/2019  . PILONIDAL CYST EXCISION    . VIDEO BRONCHOSCOPY WITH ENDOBRONCHIAL ULTRASOUND Right 12/18/2019   Procedure: VIDEO BRONCHOSCOPY;  Surgeon: Garner Nash, DO;  Location: [facility];  Service: Pulmonary;  Laterality: Right;    Family History  Problem Relation Age of Onset  . CAD Mother        stents  . Stroke Neg Hx   . Diabetes Neg Hx   . Cancer Neg Hx   . Colon cancer Neg Hx   . Esophageal cancer Neg Hx   . Stomach cancer Neg Hx   . Rectal cancer Neg Hx     Social History   Socioeconomic History  . Marital status: Divorced    Spouse name: Not on file  . Number of children:  Not on file  . Years of education: 81  . Highest education level: 12th grade  Occupational History  . Occupation: Applying for Disability  Tobacco Use  . Smoking status: Current Every Day Smoker    Packs/day: 3.00    Years: 40.00    Pack years: 120.00    Last attempt to quit: 01/18/2020    Years since quitting: 0.7  . Smokeless tobacco: Never Used  . Tobacco comment: Currently smoking about 5 cigs a day  Vaping Use  . Vaping Use: Never used  Substance and Sexual Activity  . Alcohol use: Yes    Comment: Rarely  . Drug use: Never  . Sexual activity: Yes    Partners: Female  Other Topics Concern  . Not on file  Social History Narrative   Lives with brother Claiborne Billings) and fiancee Mamie Nick)  73 yo son died remotely   65 yo son died April 10, 2020 of presumed MI (morbidly obese)   Occ: [employer]    Edu: HS, self taught [employer]   Activity:    Diet:    Social Determinants of Health   Financial Resource Strain: Low Risk   . Difficulty of Paying Living Expenses: Not hard at all  Food Insecurity: No Food Insecurity  . Worried About Charity fundraiser in the Last Year: Never true  . Ran Out of Food in the Last Year: Never true  Transportation Needs: No Transportation Needs  . Lack of Transportation (Medical): No  . Lack of Transportation (Non-Medical): No  Physical Activity: Inactive  . Days of Exercise per Week: 0 days  . Minutes of Exercise per Session: 0 min  Stress: No Stress Concern Present  . Feeling of Stress : Only a little  Social Connections: Moderately Isolated  . Frequency of Communication with Friends and Family: More than three times a week  . Frequency of Social Gatherings with Friends and Family: More than three times a week  . Attends Religious Services: More than 4 times per year  . Active Member of Clubs or Organizations: No  . Attends Archivist Meetings: Never  . Marital Status: Divorced  Human resources officer Violence: Not At Risk  .  Fear of Current or Ex-Partner: No  . Emotionally Abused: No  . Physically Abused: No  . Sexually Abused: No    Past Medical History, Surgical history, Social history, and Family history were reviewed and updated as appropriate.   Please see review of systems for further details on the patient's review from today.   Review of Systems:  Review of Systems  Constitutional: Negative for chills, diaphoresis and fever.  HENT: Negative for trouble swallowing and voice change.   Respiratory: Negative for cough, chest tightness, shortness of breath and wheezing.   Cardiovascular: Negative for chest pain and palpitations.  Gastrointestinal: Negative for abdominal pain, constipation, diarrhea, nausea and vomiting.  Musculoskeletal: Positive for arthralgias, gait problem and neck pain. Negative for back pain and myalgias.  Neurological: Negative for dizziness, light-headedness and headaches.    Objective:   Physical Exam:  BP 131/77 (BP Location: Right Arm, Patient Position: Sitting)   Pulse (!) 102   Temp (!) 97.5 F (36.4 C) (Tympanic)   Resp 18   Ht 5\' 11"  (1.803 m)   Wt 245 lb 6.4 oz (111.3 kg)   SpO2 100%   BMI 34.23 kg/m  ECOG: 1  Physical Exam Constitutional:      General: He is not in acute distress.    Appearance: He is not diaphoretic.  HENT:     Head: Normocephalic and atraumatic.  Eyes:     General: No scleral icterus.       Right eye: No discharge.        Left eye: No discharge.     Conjunctiva/sclera: Conjunctivae normal.  Cardiovascular:     Rate and Rhythm: Normal rate and regular rhythm.     Heart sounds: Normal heart sounds. No murmur heard. No friction rub. No gallop.   Pulmonary:     Effort: Pulmonary effort is normal. No respiratory distress.     Breath sounds: Examination of the right-upper field reveals decreased breath sounds. Examination of the left-upper field reveals decreased breath sounds. Examination of the right-middle field reveals decreased  breath sounds. Examination of the left-middle field reveals decreased breath sounds. Examination of the right-lower  field reveals decreased breath sounds. Examination of the left-lower field reveals decreased breath sounds. Decreased breath sounds present. No wheezing or rales.  Musculoskeletal:        General: Tenderness (Left mid-scapula) present.  Skin:    General: Skin is warm and dry.     Findings: No erythema or rash.  Neurological:     Mental Status: He is alert.     Gait: Gait abnormal (The patient is ambulating with a wheelchair.).     Lab Review:     Component Value Date/Time   NA 139 10/02/2020 1418   K 4.2 10/02/2020 1418   CL 102 10/02/2020 1418   CO2 28 10/02/2020 1418   GLUCOSE 162 (H) 10/02/2020 1418   BUN 11 10/02/2020 1418   CREATININE 0.66 10/02/2020 1418   CALCIUM 10.2 10/02/2020 1418   PROT 7.4 10/02/2020 1418   ALBUMIN 2.3 (L) 10/02/2020 1418   AST 15 10/02/2020 1418   ALT 15 10/02/2020 1418   ALKPHOS 89 10/02/2020 1418   BILITOT 0.3 10/02/2020 1418   GFRNONAA >60 10/02/2020 1418   GFRAA >60 07/08/2020 0808       Component Value Date/Time   WBC 12.2 (H) 10/02/2020 1418   WBC 8.9 07/27/2020 0401   RBC 3.59 (L) 10/02/2020 1418   HGB 10.0 (L) 10/02/2020 1418   HCT 31.3 (L) 10/02/2020 1418   PLT 317 10/02/2020 1418   MCV 87.2 10/02/2020 1418   MCH 27.9 10/02/2020 1418   MCHC 31.9 10/02/2020 1418   RDW 15.9 (H) 10/02/2020 1418   LYMPHSABS 0.5 (L) 10/02/2020 1418   MONOABS 0.9 10/02/2020 1418   EOSABS 0.0 10/02/2020 1418   BASOSABS 0.0 10/02/2020 1418   -------------------------------  Imaging from last 24 hours (if applicable):  Radiology interpretation: DG Cervical Spine 2-3 Views  Result Date: 10/02/2020 CLINICAL DATA:  Left-sided neck pain history of lung cancer EXAM: CERVICAL SPINE - 2-3 VIEW COMPARISON:  MRI 09/02/2014 FINDINGS: Mild reversal of cervical lordosis. Vertebral body heights are normal. Mild to moderate degenerative changes  C4-C5, C5-C6 and C6-C7. The dens and lateral masses are within normal limits IMPRESSION: Mild to moderate degenerative changes, most advanced C4 through C7. No acute osseous abnormality. Electronically Signed   By: Donavan Foil M.D.   On: 10/02/2020 17:00   DG Clavicle Left  Result Date: 10/02/2020 CLINICAL DATA:  Left clavicle pain EXAM: LEFT CLAVICLE - 2+ VIEWS COMPARISON:  None. FINDINGS: No fracture or malalignment.  Mild AC joint degenerative change. IMPRESSION: No acute osseous abnormality. Electronically Signed   By: Donavan Foil M.D.   On: 10/02/2020 17:01   CT Chest W Contrast  Result Date: 09/09/2020 CLINICAL DATA:  Primary Cancer Type: Lung Imaging Indication: Assess response to therapy Interval therapy since last imaging? Yes Initial Cancer Diagnosis Date: 12/18/2019; Established by: Biopsy-proven Detailed Pathology: Stage IV non-small cell lung cancer, squamous cell carcinoma. Primary Tumor location: Right upper lobe. Skeletal metastases to sternum, right eighth rib, left femoral neck, left ilium, right acetabulum, right side S1. Surgeries: No thoracic. Prophylactic stabilization of impending pathologic fracture of left intertrochanteric femur 07/25/2020. Chemotherapy: Yes; Ongoing? No; Most recent administration: 04/02/2020 Immunotherapy?  Yes; Type: Keytruda; Ongoing? Yes Radiation therapy? Yes Date Range: 08/15/2020 - 08/28/2020; Target: Left pelvis Date Range: 07/07/2020 - 07/16/2020; Target: Right lung, right ribs, right pelvis Date Range: 01/01/2020 - 01/14/2020; Target: Right chest, right ribs, right pelvis EXAM: CT CHEST, ABDOMEN, AND PELVIS WITH CONTRAST TECHNIQUE: Multidetector CT imaging of the chest, abdomen and pelvis  was performed following the standard protocol during bolus administration of intravenous contrast. CONTRAST:  157mL OMNIPAQUE IOHEXOL 300 MG/ML  SOLN COMPARISON:  Multiple exams, including CT chest, abdomen and pelvis 06/06/2020 and 12/20/2019 PET-CT. FINDINGS: CT  CHEST FINDINGS Cardiovascular: Coronary, aortic arch, and branch vessel atherosclerotic vascular disease. Stable appearance of web like peripheral filling defect best seen in the left lower lobe pulmonary artery on image 29 of series 2 compatible with remote/chronic pulmonary embolus. Mediastinum/Nodes: Left supraclavicular node 1.6 cm in short axis on image 9 series 2, previously 0.6 cm. AP window lymph node 1.2 cm in short axis on image 22 series 2, previously 0.5 cm. Right hilar node 1.8 cm in short axis on image 27 series 2, previously the same. Right upper paratracheal/paraesophageal lymph node 1.2 cm in short axis on image 10 series 2, previously 1.3 cm. Stable subcarinal lymph node. Lungs/Pleura: Right upper lobe mass 3.6 by 2.1 cm on image 54 of series 6, previously 3.0 by 1.2 cm by my measurements. Right lower lobe sub solid peribronchovascular nodule 1.1 cm in diameter on image 88 of series 6, new compared to previous. Airway thickening is present, suggesting bronchitis or reactive airways disease. New trace right pleural effusion. Musculoskeletal: Progressive prominent lytic lesion of the left medial clavicle. Small lytic lesion of the left anterior humeral head on image 8 of series 6, new compared to previous. Worsening lytic component of the lytic and sclerotic sternal lesion, lytic component currently 2.6 by 1.6 cm on image 134 of series 5, and previously 1.0 by 0.6 cm. Stable sclerotic lesion of the right lateral eighth rib. A large lytic lesion of the right medial eleventh rib partially scallops the T11 vertebral body, and measures 5.2 by 2.9 cm on image 53 of series 2, previously 1.4 by 1.0 cm. CT ABDOMEN PELVIS FINDINGS Hepatobiliary: Newly appreciable hypoenhancing mass measuring 3.0 by 2.1 cm in the right hepatic lobe on image 65 of series 2. Scalloped inferior contour of the right hepatic lobe adjacent to a 2.3 by 1.0 cm inferior right hepatic lobe subcapsular lesion on image 80 of series 4.  Gallbladder unremarkable. No biliary dilatation. Pancreas: Unremarkable Spleen: Unremarkable Adrenals/Urinary Tract: Unremarkable Stomach/Bowel: Indistinct density at the anorectal junction is nonspecific and probably incidental for example on image 131 of series 2, correlate with any rectal symptoms. There is no specific abnormality in this vicinity on prior CT. Vascular/Lymphatic: Small but increased periaortic lymph nodes are present including a 0.7 cm left periaortic node on image 75 of series 2, previously 0.3 cm. Right external iliac node enlarged at 1.4 cm in short axis on image 108 of series 2, previously 0.3 cm. Aortoiliac atherosclerotic vascular disease. Reproductive: Unremarkable Other: Mild presacral edema. Musculoskeletal: New and progressive osseous metastatic lesions of the right upper sacrum, the left upper iliac bone, the right anterior iliac bone, the left lower sacrum, the posterior wall the right acetabulum, and the left posterior femoral neck and greater trochanter. Since the prior CT there is been left hip ORIF. IMPRESSION: 1. Substantial worsening/progression including the thoracic adenopathy, right upper lobe mass, widespread scattered lytic metastatic disease, and hepatic metastatic disease as detailed above. 2. Stable appearance of chronic pulmonary embolus best seen in the left lower lobe pulmonary artery. 3. Airway thickening is present, suggesting bronchitis or reactive airways disease. 4. New trace right pleural effusion. 5. Indistinct density at the anorectal junction, probably incidental, less likely due to an underlying mass. 6.  Aortic Atherosclerosis (ICD10-I70.0). Electronically Signed   By: Thayer Jew  Janeece Fitting M.D.   On: 09/09/2020 10:27   CT Abdomen Pelvis W Contrast  Result Date: 09/09/2020 CLINICAL DATA:  Primary Cancer Type: Lung Imaging Indication: Assess response to therapy Interval therapy since last imaging? Yes Initial Cancer Diagnosis Date: 12/18/2019; Established  by: Biopsy-proven Detailed Pathology: Stage IV non-small cell lung cancer, squamous cell carcinoma. Primary Tumor location: Right upper lobe. Skeletal metastases to sternum, right eighth rib, left femoral neck, left ilium, right acetabulum, right side S1. Surgeries: No thoracic. Prophylactic stabilization of impending pathologic fracture of left intertrochanteric femur 07/25/2020. Chemotherapy: Yes; Ongoing? No; Most recent administration: 04/02/2020 Immunotherapy?  Yes; Type: Keytruda; Ongoing? Yes Radiation therapy? Yes Date Range: 08/15/2020 - 08/28/2020; Target: Left pelvis Date Range: 07/07/2020 - 07/16/2020; Target: Right lung, right ribs, right pelvis Date Range: 01/01/2020 - 01/14/2020; Target: Right chest, right ribs, right pelvis EXAM: CT CHEST, ABDOMEN, AND PELVIS WITH CONTRAST TECHNIQUE: Multidetector CT imaging of the chest, abdomen and pelvis was performed following the standard protocol during bolus administration of intravenous contrast. CONTRAST:  198mL OMNIPAQUE IOHEXOL 300 MG/ML  SOLN COMPARISON:  Multiple exams, including CT chest, abdomen and pelvis 06/06/2020 and 12/20/2019 PET-CT. FINDINGS: CT CHEST FINDINGS Cardiovascular: Coronary, aortic arch, and branch vessel atherosclerotic vascular disease. Stable appearance of web like peripheral filling defect best seen in the left lower lobe pulmonary artery on image 29 of series 2 compatible with remote/chronic pulmonary embolus. Mediastinum/Nodes: Left supraclavicular node 1.6 cm in short axis on image 9 series 2, previously 0.6 cm. AP window lymph node 1.2 cm in short axis on image 22 series 2, previously 0.5 cm. Right hilar node 1.8 cm in short axis on image 27 series 2, previously the same. Right upper paratracheal/paraesophageal lymph node 1.2 cm in short axis on image 10 series 2, previously 1.3 cm. Stable subcarinal lymph node. Lungs/Pleura: Right upper lobe mass 3.6 by 2.1 cm on image 54 of series 6, previously 3.0 by 1.2 cm by my  measurements. Right lower lobe sub solid peribronchovascular nodule 1.1 cm in diameter on image 88 of series 6, new compared to previous. Airway thickening is present, suggesting bronchitis or reactive airways disease. New trace right pleural effusion. Musculoskeletal: Progressive prominent lytic lesion of the left medial clavicle. Small lytic lesion of the left anterior humeral head on image 8 of series 6, new compared to previous. Worsening lytic component of the lytic and sclerotic sternal lesion, lytic component currently 2.6 by 1.6 cm on image 134 of series 5, and previously 1.0 by 0.6 cm. Stable sclerotic lesion of the right lateral eighth rib. A large lytic lesion of the right medial eleventh rib partially scallops the T11 vertebral body, and measures 5.2 by 2.9 cm on image 53 of series 2, previously 1.4 by 1.0 cm. CT ABDOMEN PELVIS FINDINGS Hepatobiliary: Newly appreciable hypoenhancing mass measuring 3.0 by 2.1 cm in the right hepatic lobe on image 65 of series 2. Scalloped inferior contour of the right hepatic lobe adjacent to a 2.3 by 1.0 cm inferior right hepatic lobe subcapsular lesion on image 80 of series 4. Gallbladder unremarkable. No biliary dilatation. Pancreas: Unremarkable Spleen: Unremarkable Adrenals/Urinary Tract: Unremarkable Stomach/Bowel: Indistinct density at the anorectal junction is nonspecific and probably incidental for example on image 131 of series 2, correlate with any rectal symptoms. There is no specific abnormality in this vicinity on prior CT. Vascular/Lymphatic: Small but increased periaortic lymph nodes are present including a 0.7 cm left periaortic node on image 75 of series 2, previously 0.3 cm. Right external iliac node  enlarged at 1.4 cm in short axis on image 108 of series 2, previously 0.3 cm. Aortoiliac atherosclerotic vascular disease. Reproductive: Unremarkable Other: Mild presacral edema. Musculoskeletal: New and progressive osseous metastatic lesions of the right  upper sacrum, the left upper iliac bone, the right anterior iliac bone, the left lower sacrum, the posterior wall the right acetabulum, and the left posterior femoral neck and greater trochanter. Since the prior CT there is been left hip ORIF. IMPRESSION: 1. Substantial worsening/progression including the thoracic adenopathy, right upper lobe mass, widespread scattered lytic metastatic disease, and hepatic metastatic disease as detailed above. 2. Stable appearance of chronic pulmonary embolus best seen in the left lower lobe pulmonary artery. 3. Airway thickening is present, suggesting bronchitis or reactive airways disease. 4. New trace right pleural effusion. 5. Indistinct density at the anorectal junction, probably incidental, less likely due to an underlying mass. 6.  Aortic Atherosclerosis (ICD10-I70.0). Electronically Signed   By: Zuzu Befort Clines M.D.   On: 09/09/2020 10:27   DG Shoulder Left  Result Date: 10/02/2020 CLINICAL DATA:  Shoulder pain EXAM: LEFT SHOULDER - 2+ VIEW COMPARISON:  None. FINDINGS: Partially visualized left-sided central venous port. No fracture or malalignment. No suspicious bone lesion IMPRESSION: Negative. Electronically Signed   By: Donavan Foil M.D.   On: 10/02/2020 17:00   XR HIP UNILAT W OR W/O PELVIS 2-3 VIEWS LEFT  Result Date: 09/19/2020 Stable alignment of the hardware without interval change     This patient was seen with Dr. Julien Nordmann with my treatment plan reviewed with him. He expressed agreement with my medical management of this patient.  ADDENDUM: Hematology/Oncology Attending: I had a face-to-face encounter with the patient.  I recommended his care plan.  This is a very pleasant 54 years old white male with metastatic non-small cell lung cancer, squamous cell carcinoma status post induction systemic chemotherapy with carboplatin, paclitaxel and Keytruda followed by few cycles of maintenance treatment with Keytruda.  Unfortunately recent imaging  studies showed evidence for disease progression his treatment with Beryle Flock was discontinued.  The patient is a scheduled to start second line systemic chemotherapy with docetaxel and Cyramza first dose tomorrow.  He presented with left neck pain which is likely secondary to his disease progression.  He is currently on pain medication with fentanyl patch and Percocet for breakthrough pain.  His pain is much better controlled after starting the fentanyl patch. We will order x-ray of the left shoulder and clavicle to rule out any other abnormalities. The patient will continue with his current pain medication for now. He is expected to start the first cycle of the second line systemic chemotherapy with docetaxel and Cyramza tomorrow.  The patient will come back for follow-up visit as previously scheduled. He was advised to call immediately if he has any other concerning symptoms in the interval.  Disclaimer: This note was dictated with voice recognition software. Similar sounding words can inadvertently be transcribed and may be missed upon review. Eilleen Kempf, MD 10/06/20

## 2020-10-03 NOTE — Progress Notes (Signed)
These results were reviewed with the patient.

## 2020-10-03 NOTE — Progress Notes (Signed)
Patient discharged in stable condition with no complaints 

## 2020-10-03 NOTE — Progress Notes (Signed)
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 Info passed along to provider and desk RN.  Kennith Center, Pharm.D., CPP 10/03/2020@12 :06 PM

## 2020-10-03 NOTE — Progress Notes (Signed)
Per Cassie [name], PA okay to treat with elevated HR.

## 2020-10-04 ENCOUNTER — Inpatient Hospital Stay: Payer: 59

## 2020-10-06 ENCOUNTER — Ambulatory Visit: Payer: 59

## 2020-10-06 ENCOUNTER — Other Ambulatory Visit: Payer: Self-pay | Admitting: *Deleted

## 2020-10-06 NOTE — Patient Outreach (Signed)
[facility]) Care Management  10/06/2020  LINDLEY HINEY [birth year] [id number]   Call placed to member, state this is not a good time to talk, request call back later this afternoon.  Will call back if time permits, if not, will follow up within the next 3-4 business days.  Valente David, [place], MSN [place] [phone]

## 2020-10-07 ENCOUNTER — Other Ambulatory Visit: Payer: Self-pay | Admitting: Physician Assistant

## 2020-10-07 ENCOUNTER — Telehealth: Payer: Self-pay

## 2020-10-07 DIAGNOSIS — G893 Neoplasm related pain (acute) (chronic): Secondary | ICD-10-CM

## 2020-10-07 DIAGNOSIS — C3491 Malignant neoplasm of unspecified part of right bronchus or lung: Secondary | ICD-10-CM

## 2020-10-07 DIAGNOSIS — C7951 Secondary malignant neoplasm of bone: Secondary | ICD-10-CM

## 2020-10-07 MED ORDER — FENTANYL 75 MCG/HR TD PT72: 1.0000 | MEDICATED_PATCH | TRANSDERMAL | 0 refills | Status: DC

## 2020-10-07 NOTE — Telephone Encounter (Signed)
Pt called stating he is having 10/10 flank pain that is sharp and takes his breath away. He states he has an appt tomorrow and wants to know if he can be prescribed something that will help him get through the day until he is seen tomorrow.  Discussed with Cassie, PA-C who advised pt cannot take Morphine with his Fentanyl patch but she will increase his Fentanyl dose to 57mcg. For the break through pain, he can take two, at the most, of the Oxycodone 5mg  q6h.  I called pt back and verified that he has Oxycodone 10mg  tabs and Hydrocodone 5mg  tabs. Pt was advised of the increase in his Fentanyl patch dose being sent today and he can only take one Oxycodone 10mg  tab every 6 hours or so. He is not to take any other pain medication. Pt expressed understanding of this information.

## 2020-10-08 ENCOUNTER — Inpatient Hospital Stay: Payer: 59

## 2020-10-08 ENCOUNTER — Inpatient Hospital Stay (HOSPITAL_BASED_OUTPATIENT_CLINIC_OR_DEPARTMENT_OTHER): Payer: 59 | Admitting: Physician Assistant

## 2020-10-08 ENCOUNTER — Encounter: Payer: Self-pay | Admitting: Physician Assistant

## 2020-10-08 ENCOUNTER — Other Ambulatory Visit: Payer: Self-pay

## 2020-10-08 VITALS — BP 115/75 | HR 99 | Temp 97.2°F | Resp 18 | Ht 71.0 in | Wt 239.4 lb

## 2020-10-08 DIAGNOSIS — C3491 Malignant neoplasm of unspecified part of right bronchus or lung: Secondary | ICD-10-CM

## 2020-10-08 DIAGNOSIS — M7989 Other specified soft tissue disorders: Secondary | ICD-10-CM | POA: Diagnosis not present

## 2020-10-08 DIAGNOSIS — K1379 Other lesions of oral mucosa: Secondary | ICD-10-CM | POA: Diagnosis not present

## 2020-10-08 DIAGNOSIS — G893 Neoplasm related pain (acute) (chronic): Secondary | ICD-10-CM | POA: Insufficient documentation

## 2020-10-08 DIAGNOSIS — C7951 Secondary malignant neoplasm of bone: Secondary | ICD-10-CM

## 2020-10-08 DIAGNOSIS — Z95828 Presence of other vascular implants and grafts: Secondary | ICD-10-CM

## 2020-10-08 DIAGNOSIS — Z5112 Encounter for antineoplastic immunotherapy: Secondary | ICD-10-CM | POA: Diagnosis not present

## 2020-10-08 LAB — CMP (CANCER CENTER ONLY)
ALT: 30 U/L (ref 0–44)
AST: 26 U/L (ref 15–41)
Albumin: 2.4 g/dL — ABNORMAL LOW (ref 3.5–5.0)
Alkaline Phosphatase: 110 U/L (ref 38–126)
Anion gap: 10 (ref 5–15)
BUN: 10 mg/dL (ref 6–20)
CO2: 29 mmol/L (ref 22–32)
Calcium: 9.5 mg/dL (ref 8.9–10.3)
Chloride: 98 mmol/L (ref 98–111)
Creatinine: 0.58 mg/dL — ABNORMAL LOW (ref 0.61–1.24)
GFR, Estimated: >60 mL/min (ref >60)
Glucose, Bld: 85 mg/dL (ref 70–99)
Potassium: 4 mmol/L (ref 3.5–5.1)
Sodium: 137 mmol/L (ref 135–145)
Total Bilirubin: 0.6 mg/dL (ref 0.3–1.2)
Total Protein: 7 g/dL (ref 6.5–8.1)

## 2020-10-08 LAB — CBC WITH DIFFERENTIAL (CANCER CENTER ONLY)
Abs Immature Granulocytes: 0.38 10*3/uL — ABNORMAL HIGH (ref 0.00–0.07)
Basophils Absolute: 0.1 10*3/uL (ref 0.0–0.1)
Basophils Relative: 1 %
Eosinophils Absolute: 0 10*3/uL (ref 0.0–0.5)
Eosinophils Relative: 0 %
HCT: 30.4 % — ABNORMAL LOW (ref 39.0–52.0)
Hemoglobin: 9.7 g/dL — ABNORMAL LOW (ref 13.0–17.0)
Immature Granulocytes: 7 %
Lymphocytes Relative: 12 %
Lymphs Abs: 0.6 10*3/uL — ABNORMAL LOW (ref 0.7–4.0)
MCH: 27.9 pg (ref 26.0–34.0)
MCHC: 31.9 g/dL (ref 30.0–36.0)
MCV: 87.4 fL (ref 80.0–100.0)
Monocytes Absolute: 0.1 10*3/uL (ref 0.1–1.0)
Monocytes Relative: 3 %
Neutro Abs: 4 10*3/uL (ref 1.7–7.7)
Neutrophils Relative %: 77 %
Platelet Count: 215 10*3/uL (ref 150–400)
RBC: 3.48 MIL/uL — ABNORMAL LOW (ref 4.22–5.81)
RDW: 15.9 % — ABNORMAL HIGH (ref 11.5–15.5)
WBC Count: 5.2 10*3/uL (ref 4.0–10.5)
nRBC: 0 % (ref 0.0–0.2)

## 2020-10-08 MED ORDER — SODIUM CHLORIDE 0.9% FLUSH
10.0000 mL | Freq: Once | INTRAVENOUS | Status: AC
Start: 2020-10-08 — End: 2020-10-08
  Administered 2020-10-08: 10 mL
  Filled 2020-10-08: qty 10

## 2020-10-08 MED ORDER — MAGIC MOUTHWASH: 5.0000 mL | Freq: Four times a day (QID) | ORAL | 0 refills | Status: AC

## 2020-10-08 MED ORDER — HEPARIN SOD (PORK) LOCK FLUSH 100 UNIT/ML IV SOLN
500.0000 [IU] | Freq: Once | INTRAVENOUS | Status: AC
  Administered 2020-10-08: 500 [IU]
  Filled 2020-10-08: qty 5

## 2020-10-08 MED ORDER — OXYCODONE-ACETAMINOPHEN 5-325 MG PO TABS: 1.0000 | ORAL_TABLET | Freq: Four times a day (QID) | ORAL | 0 refills | Status: DC | PRN

## 2020-10-08 NOTE — Patient Instructions (Signed)

## 2020-10-09 ENCOUNTER — Other Ambulatory Visit: Payer: Self-pay | Admitting: *Deleted

## 2020-10-09 ENCOUNTER — Telehealth: Payer: Self-pay | Admitting: Orthopaedic Surgery

## 2020-10-09 ENCOUNTER — Encounter: Payer: Self-pay | Admitting: Orthopaedic Surgery

## 2020-10-09 ENCOUNTER — Ambulatory Visit (INDEPENDENT_AMBULATORY_CARE_PROVIDER_SITE_OTHER): Payer: 59 | Admitting: Orthopaedic Surgery

## 2020-10-09 VITALS — Ht 71.0 in | Wt 239.0 lb

## 2020-10-09 DIAGNOSIS — M25512 Pain in left shoulder: Secondary | ICD-10-CM | POA: Diagnosis not present

## 2020-10-09 MED ORDER — PREDNISONE 10 MG (21) PO TBPK: ORAL_TABLET | ORAL | 0 refills | Status: DC

## 2020-10-09 MED ORDER — METHOCARBAMOL 500 MG PO TABS: 500.0000 mg | ORAL_TABLET | Freq: Four times a day (QID) | ORAL | 6 refills | Status: AC | PRN

## 2020-10-09 NOTE — Progress Notes (Signed)
Office Visit Note   Patient: Mike [name]           Date of Birth: 03-17-1966           MRN: [id number] Visit Date: 10/09/2020              Requested by: [name], MD [place],  [place] [address] PCP: [name], MD   Assessment & Plan: Visit Diagnoses:  1. Acute pain of left shoulder     Plan: Impression is left shoulder and neck pain.  Overall I think that this is presenting like cervical radiculopathy.  I have recommended a prednisone Dosepak as well as Robaxin.  He will continue taking his usual fentanyl and Percocet as needed for pain.  We will see him back as needed.  Follow-Up Instructions: Return if symptoms worsen or fail to improve.   Orders:  No orders of the defined types were placed in this encounter.  Meds ordered this encounter  Medications  . predniSONE (STERAPRED UNI-PAK 21 TAB) 10 MG (21) TBPK tablet    Sig: Take as directed    Dispense:  21 tablet    Refill:  0  . methocarbamol (ROBAXIN) 500 MG tablet    Sig: Take 1 tablet (500 mg total) by mouth every 6 (six) hours as needed for muscle spasms.    Dispense:  60 tablet    Refill:  6      Procedures: No procedures performed   Clinical Data: No additional findings.   Subjective: Chief Complaint  Patient presents with  . Neck - Pain  . Left Shoulder - Pain    [name] is a 54 year old gentleman who comes in for evaluation of left shoulder and neck pain for 4 weeks.  He has trouble lifting or pushing with his shoulder.  He has noticed decreased range of motion and increased pain with use of the arm.  He is currently back on chemotherapy for stage IV lung cancer.  He had one episode in which he had numbness all the way down the arm but otherwise he had no radicular symptoms.   Review of Systems  Constitutional: Negative.   All other systems reviewed and are negative.    Objective: Vital Signs: Ht 5\' 11"  (1.803 m)   Wt 239 lb (108.4 kg)   BMI 33.33 kg/m    Physical Exam Vitals and nursing note reviewed.  Constitutional:      Appearance: He is well-developed and well-nourished.  Pulmonary:     Effort: Pulmonary effort is normal.  Abdominal:     Palpations: Abdomen is soft.  Skin:    General: Skin is warm.  Neurological:     Mental Status: He is alert and oriented to person, place, and time.  Psychiatric:        Mood and Affect: Mood and affect normal.        Behavior: Behavior normal.        Thought Content: Thought content normal.        Judgment: Judgment normal.     Ortho Exam Left shoulder shows no obvious masses or lesions.  He has a pain with palpation throughout shoulder.  He has pain with range of motion.  Manual muscle testing is difficult secondary to pain and guarding. Specialty Comments:  No specialty comments available.  Imaging: No results found.   PMFS History: Patient Active Problem List   Diagnosis Date Noted  . Acute pain of left shoulder 10/09/2020  .  Leg swelling 10/08/2020  . Cancer related pain 10/08/2020  . Femoral fracture ([place]) 07/24/2020  . Impending pathologic fracture 07/22/2020  . Situational depression 02/22/2020  . UTI due to extended-spectrum beta lactamase (ESBL) producing Escherichia coli 02/10/2020  . Abnormal thyroid function test 02/10/2020  . Port-A-Cath in place 01/29/2020  . Acute pulmonary embolism ([place]) 01/20/2020  . Stage IV squamous cell carcinoma of right lung ([place]) 12/24/2019  . Goals of care, counseling/discussion 12/24/2019  . Encounter for antineoplastic chemotherapy 12/24/2019  . Encounter for antineoplastic immunotherapy 12/24/2019  . Rib pain on right side 12/12/2019  . Bony metastasis ([place]) 12/12/2019  . Fatty liver 12/05/2019  . Encounter for well adult exam with abnormal findings 11/25/2019  . Chest pain 11/25/2019  . GERD (gastroesophageal reflux disease) 11/25/2019  . Dyslipidemia associated with type 2 diabetes mellitus ([place]) 11/25/2019  . Ex-smoker  09/15/2019  . OSA (obstructive sleep apnea) 09/15/2019  . Severe obesity (BMI 35.0-39.9) with comorbidity ([place]) 09/04/2019  . Erectile dysfunction 10/18/2014  . Type 2 diabetes mellitus with other specified complication ([place]) 28/78/6767   Past Medical History:  Diagnosis Date  . Arthritis    neck  . Cancer, metastatic to bone ([place]) dx'd 10/2019  . Diabetes ([place])   . Fatty liver 12/05/2019   By [place] 11/2019  . GERD (gastroesophageal reflux disease)   . 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 High cholesterol   . IBS (irritable bowel syndrome)   . lung ca dx'd 10/2019   lung stage 4   . OSA (obstructive sleep apnea) 09/15/2019   Sleep study 12/2017 - severe OSA with AHI 65.6, desat to 76% rec CPAP  . Severe obesity (BMI 35.0-39.9) with comorbidity ([place]) 09/04/2019    Family History  Problem Relation Age of Onset  . CAD Mother        stents  . Stroke Neg Hx   . Diabetes Neg Hx   . Cancer Neg Hx   . Colon cancer Neg Hx   . Esophageal cancer Neg Hx   . Stomach cancer Neg Hx   . Rectal cancer Neg Hx     Past Surgical History:  Procedure Laterality Date  . BIOPSY  12/18/2019   Procedure: BIOPSY;  Surgeon: Garner Nash, DO;  Location: [facility];  Service: Pulmonary;;  . BRONCHIAL BRUSHINGS  12/18/2019   Procedure: BRONCHIAL BRUSHINGS;  Surgeon: Garner Nash, DO;  Location: [facility];  Service: Pulmonary;;  . BRONCHIAL WASHINGS  12/18/2019   Procedure: BRONCHIAL WASHINGS;  Surgeon: Garner Nash, DO;  Location: [facility];  Service: Pulmonary;;  . COLONOSCOPY  10/2019   mult TA, int hem, rpt 1 yr ([place])  . CYSTECTOMY     knee- left knee cyst  . ENDOBRONCHIAL ULTRASOUND  12/18/2019   Procedure: ENDOBRONCHIAL ULTRASOUND;  Surgeon: Garner Nash, DO;  Location: [facility];  Service: Pulmonary;;  . FINE NEEDLE ASPIRATION  12/18/2019   Procedure: FINE NEEDLE ASPIRATION;  Surgeon: Garner Nash, DO;  Location: [facility];  Service: Pulmonary;;  .  INTRAMEDULLARY (IM) NAIL INTERTROCHANTERIC Left 07/25/2020   Procedure: LEFT INTERTROCHANTERIC INTRAMEDULLARY (IM) NAIL;  Surgeon: Leandrew Koyanagi, MD;  Location: [facility];  Service: Orthopedics;  Laterality: Left;  . IR IMAGING GUIDED PORT INSERTION  12/27/2019  . PILONIDAL CYST EXCISION    . VIDEO BRONCHOSCOPY WITH ENDOBRONCHIAL ULTRASOUND Right 12/18/2019   Procedure: VIDEO BRONCHOSCOPY;  Surgeon: Garner Nash, DO;  Location: [facility];  Service: Pulmonary;  Laterality: Right;   Social History   Occupational History  . Occupation: Applying for Disability  Tobacco Use  . Smoking status: Current Every Day Smoker    Packs/day: 3.00    Years: 40.00    Pack years: 120.00    Last attempt to quit: 01/18/2020    Years since quitting: 0.7  . Smokeless tobacco: Never Used  . Tobacco comment: Currently smoking about 5 cigs a day  Vaping Use  . Vaping Use: Never used  Substance and Sexual Activity  . Alcohol use: Yes    Comment: Rarely  . Drug use: Never  . Sexual activity: Yes    Partners: Female

## 2020-10-09 NOTE — Patient Outreach (Signed)
[facility]) Care Management  10/09/2020  Mike [name] 27-Jul-1966 [id number]   Outreach attempt #2, successful however he report he is feeling groggy, request to have this care manager call back a later time.  Will follow up within the next 3-4 business day.  Valente David, [place], MSN [place] [phone]

## 2020-10-09 NOTE — Telephone Encounter (Signed)
error 

## 2020-10-10 ENCOUNTER — Emergency Department
Admission: EM | Admit: 2020-10-10 | Discharge: 2020-10-10 | Disposition: A | Discharge status: Home / Self Care | Payer: 59 | Attending: Emergency Medicine | Admitting: Emergency Medicine

## 2020-10-10 ENCOUNTER — Encounter (HOSPITAL_COMMUNITY): Payer: Self-pay

## 2020-10-10 ENCOUNTER — Emergency Department (HOSPITAL_COMMUNITY): Payer: 59

## 2020-10-10 ENCOUNTER — Other Ambulatory Visit: Payer: Self-pay

## 2020-10-10 ENCOUNTER — Emergency Department (HOSPITAL_COMMUNITY)
Admission: EM | Admit: 2020-10-10 | Discharge: 2020-10-10 | Disposition: A | Discharge status: Home / Self Care | Payer: 59 | Attending: Emergency Medicine | Admitting: Emergency Medicine

## 2020-10-10 DIAGNOSIS — E119 Type 2 diabetes mellitus without complications: Secondary | ICD-10-CM | POA: Insufficient documentation

## 2020-10-10 DIAGNOSIS — Z85830 Personal history of malignant neoplasm of bone: Secondary | ICD-10-CM | POA: Diagnosis not present

## 2020-10-10 DIAGNOSIS — F17200 Nicotine dependence, unspecified, uncomplicated: Secondary | ICD-10-CM | POA: Insufficient documentation

## 2020-10-10 DIAGNOSIS — R109 Unspecified abdominal pain: Secondary | ICD-10-CM | POA: Insufficient documentation

## 2020-10-10 DIAGNOSIS — Z85118 Personal history of other malignant neoplasm of bronchus and lung: Secondary | ICD-10-CM | POA: Insufficient documentation

## 2020-10-10 DIAGNOSIS — X58XXXA Exposure to other specified factors, initial encounter: Secondary | ICD-10-CM | POA: Diagnosis not present

## 2020-10-10 DIAGNOSIS — Z7984 Long term (current) use of oral hypoglycemic drugs: Secondary | ICD-10-CM | POA: Insufficient documentation

## 2020-10-10 DIAGNOSIS — Z859 Personal history of malignant neoplasm, unspecified: Secondary | ICD-10-CM | POA: Diagnosis not present

## 2020-10-10 DIAGNOSIS — M549 Dorsalgia, unspecified: Secondary | ICD-10-CM

## 2020-10-10 DIAGNOSIS — Z7901 Long term (current) use of anticoagulants: Secondary | ICD-10-CM | POA: Insufficient documentation

## 2020-10-10 DIAGNOSIS — S22080A Wedge compression fracture of T11-T12 vertebra, initial encounter for closed fracture: Secondary | ICD-10-CM | POA: Insufficient documentation

## 2020-10-10 DIAGNOSIS — Z5321 Procedure and treatment not carried out due to patient leaving prior to being seen by health care provider: Secondary | ICD-10-CM | POA: Insufficient documentation

## 2020-10-10 DIAGNOSIS — S299XXA Unspecified injury of thorax, initial encounter: Secondary | ICD-10-CM | POA: Diagnosis present

## 2020-10-10 LAB — COMPREHENSIVE METABOLIC PANEL
ALT: 30 U/L (ref 0–44)
AST: 24 U/L (ref 15–41)
Albumin: 2.8 g/dL — ABNORMAL LOW (ref 3.5–5.0)
Alkaline Phosphatase: 109 U/L (ref 38–126)
Anion gap: 11 (ref 5–15)
BUN: 8 mg/dL (ref 6–20)
CO2: 28 mmol/L (ref 22–32)
Calcium: 9 mg/dL (ref 8.9–10.3)
Chloride: 98 mmol/L (ref 98–111)
Creatinine, Ser: 0.58 mg/dL — ABNORMAL LOW (ref 0.61–1.24)
GFR, Estimated: >60 mL/min (ref >60)
Glucose, Bld: 119 mg/dL — ABNORMAL HIGH (ref 70–99)
Potassium: 4.2 mmol/L (ref 3.5–5.1)
Sodium: 137 mmol/L (ref 135–145)
Total Bilirubin: 0.5 mg/dL (ref 0.3–1.2)
Total Protein: 7.2 g/dL (ref 6.5–8.1)

## 2020-10-10 LAB — CBC WITH DIFFERENTIAL/PLATELET
Abs Immature Granulocytes: 0.72 10*3/uL — ABNORMAL HIGH (ref 0.00–0.07)
Basophils Absolute: 0.1 10*3/uL (ref 0.0–0.1)
Basophils Relative: 2 %
Eosinophils Absolute: 0 10*3/uL (ref 0.0–0.5)
Eosinophils Relative: 0 %
HCT: 31.8 % — ABNORMAL LOW (ref 39.0–52.0)
Hemoglobin: 9.9 g/dL — ABNORMAL LOW (ref 13.0–17.0)
Immature Granulocytes: 13 %
Lymphocytes Relative: 16 %
Lymphs Abs: 0.9 10*3/uL (ref 0.7–4.0)
MCH: 28 pg (ref 26.0–34.0)
MCHC: 31.1 g/dL (ref 30.0–36.0)
MCV: 90.1 fL (ref 80.0–100.0)
Monocytes Absolute: 1.1 10*3/uL — ABNORMAL HIGH (ref 0.1–1.0)
Monocytes Relative: 20 %
Neutro Abs: 2.7 10*3/uL (ref 1.7–7.7)
Neutrophils Relative %: 49 %
Platelets: 206 10*3/uL (ref 150–400)
RBC: 3.53 MIL/uL — ABNORMAL LOW (ref 4.22–5.81)
RDW: 15.8 % — ABNORMAL HIGH (ref 11.5–15.5)
WBC: 5.5 10*3/uL (ref 4.0–10.5)
nRBC: 1.8 % — ABNORMAL HIGH (ref 0.0–0.2)

## 2020-10-10 LAB — URINALYSIS, ROUTINE W REFLEX MICROSCOPIC
Bilirubin Urine: NEGATIVE
Glucose, UA: NEGATIVE mg/dL
Hgb urine dipstick: NEGATIVE
Ketones, ur: NEGATIVE mg/dL
Leukocytes,Ua: NEGATIVE
Nitrite: NEGATIVE
Protein, ur: NEGATIVE mg/dL
Specific Gravity, Urine: 1.012 (ref 1.005–1.030)
pH: 6 (ref 5.0–8.0)

## 2020-10-10 MED ORDER — ONDANSETRON HCL 4 MG/2ML IJ SOLN
4.0000 mg | Freq: Once | INTRAMUSCULAR | Status: AC
  Administered 2020-10-10: 16:00:00 4 mg via INTRAVENOUS
  Filled 2020-10-10: qty 2

## 2020-10-10 MED ORDER — HEPARIN SOD (PORK) LOCK FLUSH 100 UNIT/ML IV SOLN
500.0000 [IU] | Freq: Once | INTRAVENOUS | Status: AC
  Administered 2020-10-10: 20:00:00 500 [IU]
  Filled 2020-10-10: qty 5

## 2020-10-10 MED ORDER — IOHEXOL 350 MG/ML SOLN
100.0000 mL | Freq: Once | INTRAVENOUS | Status: AC | PRN
  Administered 2020-10-10: 17:00:00 100 mL via INTRAVENOUS

## 2020-10-10 MED ORDER — LIDOCAINE-PRILOCAINE 2.5-2.5 % EX CREA
TOPICAL_CREAM | Freq: Once | CUTANEOUS | Status: AC
  Administered 2020-10-10: 1 via TOPICAL
  Filled 2020-10-10: qty 5

## 2020-10-10 MED ORDER — MORPHINE SULFATE (PF) 4 MG/ML IV SOLN
4.0000 mg | Freq: Once | INTRAVENOUS | Status: AC
Start: 2020-10-10 — End: 2020-10-10
  Administered 2020-10-10: 16:00:00 4 mg via INTRAVENOUS
  Filled 2020-10-10: qty 1

## 2020-10-10 MED ORDER — MORPHINE SULFATE (PF) 4 MG/ML IV SOLN
8.0000 mg | Freq: Once | INTRAVENOUS | Status: AC
  Administered 2020-10-10: 20:00:00 8 mg via INTRAVENOUS
  Filled 2020-10-10: qty 2

## 2020-10-10 MED ORDER — HYDROMORPHONE HCL 1 MG/ML IJ SOLN
1.0000 mg | Freq: Once | INTRAMUSCULAR | Status: AC
Start: 2020-10-10 — End: 2020-10-10
  Administered 2020-10-10: 18:00:00 1 mg via INTRAVENOUS
  Filled 2020-10-10: qty 1

## 2020-10-10 NOTE — ED Triage Notes (Signed)
Pt presents via EMS c/o bilateral flank pain. Reports first episode was a week ago however it is intermittent and worse today. Reports hx stage IV metastatic disease.

## 2020-10-10 NOTE — ED Provider Notes (Signed)
[facility] DEPT Provider Note   CSN: [id number] Arrival date & time: 10/10/20  1254     History Chief Complaint  Patient presents with  . Back Pain    Mike [name] is a 54 y.o. male.  HPI      Mike [name] is a 54 y.o. male, with a history of metastatic lung cancer, GERD, presenting to the ED with back pain for the last several days.  Pain is worse on the right mid and lower back, present along the lower spine, and extending toward the left lower back and the right flank.  Initially there was more variation in the patient's pain, intermittency and waxing and waning, however, it has since become more constant.  Pain came on suddenly while standing.  Pain is worse with deep breathing.  Currently rated 8/10, even after home Percocet. He is currently undergoing chemotherapy for metastatic lung cancer. He has had Covid vaccination. Denies fever/chills, N/V/C/D, cough, abdominal pain, chest pain, syncope, numbness, weakness, hematuria, dysuria, difficulty urinating, or any other complaints.  Past Medical History:  Diagnosis Date  . Arthritis    neck  . Cancer, metastatic to bone ([place]) dx'd 10/2019  . Diabetes ([place])   . Fatty liver 12/05/2019   By [place] 11/2019  . GERD (gastroesophageal reflux disease)   . 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 High cholesterol   . IBS (irritable bowel syndrome)   . lung ca dx'd 10/2019   lung stage 4   . OSA (obstructive sleep apnea) 09/15/2019   Sleep study 12/2017 - severe OSA with AHI 65.6, desat to 76% rec CPAP  . Severe obesity (BMI 35.0-39.9) with comorbidity ([place]) 09/04/2019    Patient Active Problem List   Diagnosis Date Noted  . Acute pain of left shoulder 10/09/2020  . Leg swelling 10/08/2020  . Cancer related pain 10/08/2020  . Femoral fracture ([place]) 07/24/2020  . Impending pathologic fracture 07/22/2020  . Situational depression 02/22/2020  . UTI due to extended-spectrum beta lactamase  (ESBL) producing Escherichia coli 02/10/2020  . Abnormal thyroid function test 02/10/2020  . Port-A-Cath in place 01/29/2020  . Acute pulmonary embolism ([place]) 01/20/2020  . 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 Goals of care, counseling/discussion 12/24/2019  . Encounter for antineoplastic chemotherapy 12/24/2019  . Encounter for antineoplastic immunotherapy 12/24/2019  . Rib pain on right side 12/12/2019  . Bony metastasis ([place]) 12/12/2019  . Fatty liver 12/05/2019  . Encounter for well adult exam with abnormal findings 11/25/2019  . Chest pain 11/25/2019  . GERD (gastroesophageal reflux disease) 11/25/2019  . Dyslipidemia associated with type 2 diabetes mellitus ([place]) 11/25/2019  . Ex-smoker 09/15/2019  . OSA (obstructive sleep apnea) 09/15/2019  . Severe obesity (BMI 35.0-39.9) with comorbidity ([place]) 09/04/2019  . Erectile dysfunction 10/18/2014  . Type 2 diabetes mellitus with other specified complication ([place]) 42/70/6237    Past Surgical History:  Procedure Laterality Date  . BIOPSY  12/18/2019   Procedure: BIOPSY;  Surgeon: Garner Nash, DO;  Location: [facility];  Service: Pulmonary;;  . BRONCHIAL BRUSHINGS  12/18/2019   Procedure: BRONCHIAL BRUSHINGS;  Surgeon: Garner Nash, DO;  Location: [facility];  Service: Pulmonary;;  . BRONCHIAL WASHINGS  12/18/2019   Procedure: BRONCHIAL WASHINGS;  Surgeon: Garner Nash, DO;  Location: [facility];  Service: Pulmonary;;  . COLONOSCOPY  10/2019   mult TA, int hem, rpt 1 yr ([place])  .  CYSTECTOMY     knee- left knee cyst  . ENDOBRONCHIAL ULTRASOUND  12/18/2019   Procedure: ENDOBRONCHIAL ULTRASOUND;  Surgeon: Garner Nash, DO;  Location: [facility];  Service: Pulmonary;;  . FINE NEEDLE ASPIRATION  12/18/2019   Procedure: FINE NEEDLE ASPIRATION;  Surgeon: Garner Nash, DO;  Location: [facility];  Service: Pulmonary;;  . INTRAMEDULLARY (IM) NAIL INTERTROCHANTERIC Left 07/25/2020    Procedure: LEFT INTERTROCHANTERIC INTRAMEDULLARY (IM) NAIL;  Surgeon: Leandrew Koyanagi, MD;  Location: [facility];  Service: Orthopedics;  Laterality: Left;  . IR IMAGING GUIDED PORT INSERTION  12/27/2019  . PILONIDAL CYST EXCISION    . VIDEO BRONCHOSCOPY WITH ENDOBRONCHIAL ULTRASOUND Right 12/18/2019   Procedure: VIDEO BRONCHOSCOPY;  Surgeon: Garner Nash, DO;  Location: [facility];  Service: Pulmonary;  Laterality: Right;       Family History  Problem Relation Age of Onset  . CAD Mother        stents  . Stroke Neg Hx   . Diabetes Neg Hx   . Cancer Neg Hx   . Colon cancer Neg Hx   . Esophageal cancer Neg Hx   . Stomach cancer Neg Hx   . Rectal cancer Neg Hx     Social History   Tobacco Use  . Smoking status: Current Every Day Smoker    Packs/day: 3.00    Years: 40.00    Pack years: 120.00    Last attempt to quit: 01/18/2020    Years since quitting: 0.7  . Smokeless tobacco: Never Used  . Tobacco comment: Currently smoking about 5 cigs a day  Vaping Use  . Vaping Use: Never used  Substance Use Topics  . Alcohol use: Yes    Comment: Rarely  . Drug use: Never    Home Medications Prior to Admission medications   Medication Sig Start Date End Date Taking? Authorizing Provider  acetaminophen (TYLENOL) 325 MG tablet Take 1-2 tablets (325-650 mg total) by mouth every 6 (six) hours as needed for mild pain (pain score 1-3 or temp > 100.5). 07/28/20   Cherene Altes, MD  albuterol (PROVENTIL) (2.5 MG/3ML) 0.083% nebulizer solution Take 3 mLs (2.5 mg total) by nebulization every 6 (six) hours as needed for wheezing or shortness of breath. 01/09/20   Icard, Leory Plowman L, DO  atorvastatin (LIPITOR) 20 MG tablet Take 1 tablet (20 mg total) by mouth at bedtime. 05/01/20   [name], MD  dexamethasone (DECADRON) 4 MG tablet Take 2 tablets TWICE a day the day before, the day of, and the day after chemotherapy. 09/25/20   [name], [name], PA-C  diazepam (VALIUM) 5 MG tablet  Take 1 tablet (5 mg total) by mouth every 12 (twelve) hours as needed for anxiety or muscle spasms. 09/25/20   [name], MD  docusate sodium (COLACE) 100 MG capsule Take 1 capsule (100 mg total) by mouth 2 (two) times daily. 07/28/20   Cherene Altes, MD  ELIQUIS 5 MG TABS tablet TAKE 1 TABLET BY MOUTH TWICE A DAY 09/19/20   [name], MD  fentaNYL (DURAGESIC) 75 MCG/HR Place 1 patch onto the skin every 3 (three) days. 10/07/20   [name], [name], PA-C  furosemide (LASIX) 20 MG tablet Take 1 tablet (20 mg total) by mouth daily. 08/21/20   Gery Pray, MD  magic mouthwash SOLN Take 5 mLs by mouth 4 (four) times daily. 10/08/20   [name], [name], PA-C  metFORMIN (GLUCOPHAGE) 1000 MG tablet Take 1 tablet (1,000 mg total)  by mouth 2 (two) times daily with a meal. 09/08/20   [name], MD  methocarbamol (ROBAXIN) 500 MG tablet Take 1 tablet (500 mg total) by mouth every 6 (six) hours as needed for muscle spasms. 10/09/20   Leandrew Koyanagi, MD  oxyCODONE-acetaminophen (PERCOCET) 5-325 MG tablet Take 1-2 tablets by mouth every 6 (six) hours as needed for moderate pain. 10/08/20   [name], [name], PA-C  predniSONE (STERAPRED UNI-PAK 21 TAB) 10 MG (21) TBPK tablet Take as directed 10/09/20   Leandrew Koyanagi, MD  sildenafil (REVATIO) 20 MG tablet Take 1-5 tablets (20-100 mg total) by mouth daily as needed (ED). Patient taking differently: Take 40 mg by mouth daily as needed (ED). 06/08/20   [name], MD    Allergies    Patient has no known allergies.  Review of Systems   Review of Systems  Constitutional: Negative for chills, diaphoresis and fever.  Respiratory: Negative for cough and shortness of breath.   Cardiovascular: Negative for chest pain and leg swelling.  Gastrointestinal: Negative for abdominal pain, constipation, diarrhea, nausea and vomiting.  Genitourinary: Positive for flank pain. Negative for dysuria and hematuria.   Musculoskeletal: Positive for back pain.  Neurological: Negative for syncope, weakness and numbness.  All other systems reviewed and are negative.   Physical Exam Updated Vital Signs BP 131/90   Pulse (!) 103   Temp (!) 97.5 F (36.4 C) (Oral)   Resp 16   Ht 5\' 11"  (1.803 m)   Wt 108.4 kg   SpO2 95%   BMI 33.33 kg/m   Physical Exam Vitals and nursing note reviewed.  Constitutional:      General: He is not in acute distress.    Appearance: He is well-developed. He is not diaphoretic.  HENT:     Head: Normocephalic and atraumatic.     Mouth/Throat:     Mouth: Mucous membranes are moist.     Pharynx: Oropharynx is clear.  Eyes:     Conjunctiva/sclera: Conjunctivae normal.  Cardiovascular:     Rate and Rhythm: Normal rate and regular rhythm.     Pulses: Normal pulses.          Radial pulses are 2+ on the right side and 2+ on the left side.       Posterior tibial pulses are 2+ on the right side and 2+ on the left side.     Heart sounds: Normal heart sounds.     Comments: Tactile temperature in the extremities appropriate and equal bilaterally. Pulmonary:     Effort: Pulmonary effort is normal. No respiratory distress.     Breath sounds: Normal breath sounds.  Abdominal:     Palpations: Abdomen is soft.     Tenderness: There is no abdominal tenderness. There is no guarding.  Musculoskeletal:     Cervical back: Neck supple.     Thoracic back: Tenderness and bony tenderness present.     Lumbar back: Tenderness and bony tenderness present. No swelling or deformity.       Back:     Right lower leg: No edema.     Left lower leg: No edema.     Comments: No skin abnormalities noted.  Lymphadenopathy:     Cervical: No cervical adenopathy.  Skin:    General: Skin is warm and dry.  Neurological:     Mental Status: He is alert.     Comments: Sensation grossly intact to light touch in the lower extremities bilaterally. No saddle anesthesias.  Strength 4/5 in the bilateral  lower extremities. Patient ambulates using a walker. Coordination intact.  Psychiatric:        Mood and Affect: Mood and affect normal.        Speech: Speech normal.        Behavior: Behavior normal.     ED Results / Procedures / Treatments   Labs (all labs ordered are listed, but only abnormal results are displayed) Labs Reviewed  COMPREHENSIVE METABOLIC PANEL - Abnormal; Notable for the following components:      Result Value   Glucose, Bld 119 (*)    Creatinine, Ser 0.58 (*)    Albumin 2.8 (*)    All other components within normal limits  CBC WITH DIFFERENTIAL/PLATELET - Abnormal; Notable for the following components:   RBC 3.53 (*)    Hemoglobin 9.9 (*)    HCT 31.8 (*)    RDW 15.8 (*)    nRBC 1.8 (*)    Monocytes Absolute 1.1 (*)    Abs Immature Granulocytes 0.72 (*)    All other components within normal limits  URINALYSIS, ROUTINE W REFLEX MICROSCOPIC   Hemoglobin  Date Value Ref Range Status  10/10/2020 9.9 (L) 13.0 - 17.0 g/dL Final  10/08/2020 9.7 (L) 13.0 - 17.0 g/dL Final  10/02/2020 10.0 (L) 13.0 - 17.0 g/dL Final  09/25/2020 10.5 (L) 13.0 - 17.0 g/dL Final  09/09/2020 11.1 (L) 13.0 - 17.0 g/dL Final    EKG None  Radiology CT Angio Chest PE W and/or Wo Contrast  Result Date: 10/10/2020 CLINICAL DATA:  Bilateral flank pain.  Stage IV metastatic disease. EXAM: CT ANGIOGRAPHY CHEST CT ABDOMEN AND PELVIS WITH CONTRAST CT THORACIC SPINE CT LUMBAR SPINE TECHNIQUE: Multidetector CT imaging of the chest was performed using the standard protocol during bolus administration of intravenous contrast. Multiplanar CT image reconstructions and MIPs were obtained to evaluate the vascular anatomy. Multidetector CT imaging of the abdomen and pelvis was performed using the standard protocol during bolus administration of intravenous contrast. Multidetector CT images of the thoracic were obtained using the standard protocol without intravenous contrast. Multidetector CT images  of the lumbar were obtained using the standard protocol without intravenous contrast. CONTRAST:  181mL OMNIPAQUE IOHEXOL 350 MG/ML SOLN COMPARISON:  CT chest, abdomen, pelvis 09/08/2020. FINDINGS: CTA CHEST FINDINGS Cardiovascular: Left chest wall Port-A-Cath with tip terminating in the region of the superior cavoatrial junction. Satisfactory opacification of the pulmonary arteries to the segmental level. Stable tiny nonocclusive central and left lower lobe segmental pulmonary embolus (124, 3:127). No new pulmonary embolus. Normal heart size. No pericardial effusion. At least mild left anterior descending coronary artery calcifications. Mediastinum/Nodes: Interval increase in size 1.6 cm (from 1.2 cm) right hilar lymph node. No left hilar lymph node. Stable borderline enlarged 1.1 cm prevascular lymph node. (3:103). No axillary lymphadenopathy. Lungs/Pleura: Interval increase in size of a lobulated right upper lobe 2.7 x 3.7 X 3.3cm (from 2.4 x 3.6 x 2.8 cm) pulmonary mass. Interval increase in conspicuity and increase in size of a nodule like right lower lobe density measuring up to 2.6 cm (from 1.5 cm) (4:85). Interval decrease in size of a trace right pleural effusion. No left pleural effusion. No pneumothorax. Musculoskeletal: Redemonstration of a densely sclerotic left T12 posterior rib lesion (11:77). Redemonstration of a punctate densely sclerotic lesion within the left anterolateral third and posterolateral 7th ribs. No acute displaced rib fracture. No acute displaced sternal fracture. Please see below regarding spine. Review of the MIP images confirms the  above findings. CT ABDOMEN and PELVIS FINDINGS Hepatobiliary: Interval increase in size of a right hepatic lobe 4.1 x 2.7 cm (from 3 x 2.1 cm) hypodense lesion (1:28). Interval increase in size and conspicuity of a right inferior hepatic lobe 4.3 x 2.1 (from 2.3 x 1 cm) lesion (1:39). Some areas of the liver appears slightly heterogeneous (1:19) with no  definite lesion. No definite evidence of a new hepatic lesion on this single phase contrast study. No gallstones, gallbladder wall thickening, or pericholecystic fluid. No biliary dilatation. Pancreas: No focal lesion. Normal pancreatic contour. No surrounding inflammatory changes. No main pancreatic ductal dilatation. Spleen: Normal in size without focal abnormality. Adrenals/Urinary Tract: No adrenal nodule bilaterally. Bilateral kidneys enhance symmetrically. No nephroureterolithiasis. No hydronephrosis. No hydroureter. On delayed imaging, there is no urothelial wall thickening and there are no filling defects in the opacified portions of the bilateral collecting systems or ureters. The urinary bladder is unremarkable. Stomach/Bowel: Stomach is within normal limits. No evidence of bowel wall thickening or dilatation. Stool throughout the colon. Appendix appears normal. Vascular/Lymphatic: No abdominal aorta or iliac aneurysm. Mild to moderate calcified and noncalcified atherosclerotic plaque of the aorta and its branches. Stable prominent retrocrural lymph nodes measuring up to 8 mm (1:23). Stable prominent retroperitoneal lymph nodes. Stable to slightly decreased in size enlarged right external iliac lymph node open (1:69). Now measuring 1.2 cm (from 1.5 cm). No abdominal or inguinal lymphadenopathy. Reproductive: Prostate is unremarkable. Other: No intraperitoneal free fluid. No intraperitoneal free gas. No organized fluid collection. Musculoskeletal: No acute displaced pelvic fracture. Partially visualized left proximal femur intramedullary nail fixation. No acute displaced fracture of the sacrum. Interval increase in size of a right iliac wing lytic lesion with cortical break (1:68). Redemonstration of a left iliac wing lytic lesion that extends to the superior/fall sacroiliac joint (5:92). Redemonstration of a lytic left sacral lesion (1:68, 5:103). CT THORACIC SPINE: Alignment: Twelve rib-bearing thoracic  vertebral bodies are noted. Normal. Vertebrae: Multilevel mild-to-moderate degenerative changes of the spine. Slightly more conspicuous lytic lesion and cortical destruction of the right T11 vertebral body, lamina, and superior facet (11:58, 8:118). Interval development of superior endplate T11 concavity and vague vertebral body height loss. No acute displaced fracture. Paraspinal and other soft tissues: Unremarkable. Disc levels: Maintained. CT LUMBAR SPINE: Alignment: 5 non-rib-bearing lumbar vertebral bodies are noted. Normal. Vertebrae: No acute displaced fracture. No suspicious lytic or blastic osseous lesions. Paraspinal and other soft tissues: Unremarkable Disc levels: Maintained. Review of the MIP images confirms the above findings. IMPRESSION: 1. Interval development of a pathologic compression fracture with less than 5% height loss of the T11 vertebral body with persistent right vertebral body lytic lesion that extends to the posterior elements. 2. Stable tiny nonocclusive central and left lower lobe segmental pulmonary embolus. 3. Interval increase in size of a right upper lobe pulmonary mass and right lower lobe nodular-like lesion. Associated stable to slightly increased right hilar and mediastinal lymph nodes. 4. Interval increase in size of hepatic metastatic lesions. 5. Some stable and some increased in size axial and appendicular skeleton lytic metastatic lesion. 6. Slight interval decrease in trace right pleural effusion. 7.  Aortic Atherosclerosis (ICD10-I70.0). Electronically Signed   By: Iven Finn M.D.   On: 10/10/2020 19:00   CT ABDOMEN PELVIS W CONTRAST  Result Date: 10/10/2020 CLINICAL DATA:  Bilateral flank pain.  Stage IV metastatic disease. EXAM: CT ANGIOGRAPHY CHEST CT ABDOMEN AND PELVIS WITH CONTRAST CT THORACIC SPINE CT LUMBAR SPINE TECHNIQUE: Multidetector CT imaging of  the chest was performed using the standard protocol during bolus administration of intravenous contrast.  Multiplanar CT image reconstructions and MIPs were obtained to evaluate the vascular anatomy. Multidetector CT imaging of the abdomen and pelvis was performed using the standard protocol during bolus administration of intravenous contrast. Multidetector CT images of the thoracic were obtained using the standard protocol without intravenous contrast. Multidetector CT images of the lumbar were obtained using the standard protocol without intravenous contrast. CONTRAST:  198mL OMNIPAQUE IOHEXOL 350 MG/ML SOLN COMPARISON:  CT chest, abdomen, pelvis 09/08/2020. FINDINGS: CTA CHEST FINDINGS Cardiovascular: Left chest wall Port-A-Cath with tip terminating in the region of the superior cavoatrial junction. Satisfactory opacification of the pulmonary arteries to the segmental level. Stable tiny nonocclusive central and left lower lobe segmental pulmonary embolus (124, 3:127). No new pulmonary embolus. Normal heart size. No pericardial effusion. At least mild left anterior descending coronary artery calcifications. Mediastinum/Nodes: Interval increase in size 1.6 cm (from 1.2 cm) right hilar lymph node. No left hilar lymph node. Stable borderline enlarged 1.1 cm prevascular lymph node. (3:103). No axillary lymphadenopathy. Lungs/Pleura: Interval increase in size of a lobulated right upper lobe 2.7 x 3.7 X 3.3cm (from 2.4 x 3.6 x 2.8 cm) pulmonary mass. Interval increase in conspicuity and increase in size of a nodule like right lower lobe density measuring up to 2.6 cm (from 1.5 cm) (4:85). Interval decrease in size of a trace right pleural effusion. No left pleural effusion. No pneumothorax. Musculoskeletal: Redemonstration of a densely sclerotic left T12 posterior rib lesion (11:77). Redemonstration of a punctate densely sclerotic lesion within the left anterolateral third and posterolateral 7th ribs. No acute displaced rib fracture. No acute displaced sternal fracture. Please see below regarding spine. Review of the MIP  images confirms the above findings. CT ABDOMEN and PELVIS FINDINGS Hepatobiliary: Interval increase in size of a right hepatic lobe 4.1 x 2.7 cm (from 3 x 2.1 cm) hypodense lesion (1:28). Interval increase in size and conspicuity of a right inferior hepatic lobe 4.3 x 2.1 (from 2.3 x 1 cm) lesion (1:39). Some areas of the liver appears slightly heterogeneous (1:19) with no definite lesion. No definite evidence of a new hepatic lesion on this single phase contrast study. No gallstones, gallbladder wall thickening, or pericholecystic fluid. No biliary dilatation. Pancreas: No focal lesion. Normal pancreatic contour. No surrounding inflammatory changes. No main pancreatic ductal dilatation. Spleen: Normal in size without focal abnormality. Adrenals/Urinary Tract: No adrenal nodule bilaterally. Bilateral kidneys enhance symmetrically. No nephroureterolithiasis. No hydronephrosis. No hydroureter. On delayed imaging, there is no urothelial wall thickening and there are no filling defects in the opacified portions of the bilateral collecting systems or ureters. The urinary bladder is unremarkable. Stomach/Bowel: Stomach is within normal limits. No evidence of bowel wall thickening or dilatation. Stool throughout the colon. Appendix appears normal. Vascular/Lymphatic: No abdominal aorta or iliac aneurysm. Mild to moderate calcified and noncalcified atherosclerotic plaque of the aorta and its branches. Stable prominent retrocrural lymph nodes measuring up to 8 mm (1:23). Stable prominent retroperitoneal lymph nodes. Stable to slightly decreased in size enlarged right external iliac lymph node open (1:69). Now measuring 1.2 cm (from 1.5 cm). No abdominal or inguinal lymphadenopathy. Reproductive: Prostate is unremarkable. Other: No intraperitoneal free fluid. No intraperitoneal free gas. No organized fluid collection. Musculoskeletal: No acute displaced pelvic fracture. Partially visualized left proximal femur intramedullary  nail fixation. No acute displaced fracture of the sacrum. Interval increase in size of a right iliac wing lytic lesion with cortical break (1:68).  Redemonstration of a left iliac wing lytic lesion that extends to the superior/fall sacroiliac joint (5:92). Redemonstration of a lytic left sacral lesion (1:68, 5:103). CT THORACIC SPINE: Alignment: Twelve rib-bearing thoracic vertebral bodies are noted. Normal. Vertebrae: Multilevel mild-to-moderate degenerative changes of the spine. Slightly more conspicuous lytic lesion and cortical destruction of the right T11 vertebral body, lamina, and superior facet (11:58, 8:118). Interval development of superior endplate T11 concavity and vague vertebral body height loss. No acute displaced fracture. Paraspinal and other soft tissues: Unremarkable. Disc levels: Maintained. CT LUMBAR SPINE: Alignment: 5 non-rib-bearing lumbar vertebral bodies are noted. Normal. Vertebrae: No acute displaced fracture. No suspicious lytic or blastic osseous lesions. Paraspinal and other soft tissues: Unremarkable Disc levels: Maintained. Review of the MIP images confirms the above findings. IMPRESSION: 1. Interval development of a pathologic compression fracture with less than 5% height loss of the T11 vertebral body with persistent right vertebral body lytic lesion that extends to the posterior elements. 2. Stable tiny nonocclusive central and left lower lobe segmental pulmonary embolus. 3. Interval increase in size of a right upper lobe pulmonary mass and right lower lobe nodular-like lesion. Associated stable to slightly increased right hilar and mediastinal lymph nodes. 4. Interval increase in size of hepatic metastatic lesions. 5. Some stable and some increased in size axial and appendicular skeleton lytic metastatic lesion. 6. Slight interval decrease in trace right pleural effusion. 7.  Aortic Atherosclerosis (ICD10-I70.0). Electronically Signed   By: Iven Finn M.D.   On: 10/10/2020  19:00   CT T-SPINE NO CHARGE  Result Date: 10/10/2020 CLINICAL DATA:  Bilateral flank pain.  Stage IV metastatic disease. EXAM: CT ANGIOGRAPHY CHEST CT ABDOMEN AND PELVIS WITH CONTRAST CT THORACIC SPINE CT LUMBAR SPINE TECHNIQUE: Multidetector CT imaging of the chest was performed using the standard protocol during bolus administration of intravenous contrast. Multiplanar CT image reconstructions and MIPs were obtained to evaluate the vascular anatomy. Multidetector CT imaging of the abdomen and pelvis was performed using the standard protocol during bolus administration of intravenous contrast. Multidetector CT images of the thoracic were obtained using the standard protocol without intravenous contrast. Multidetector CT images of the lumbar were obtained using the standard protocol without intravenous contrast. CONTRAST:  1102mL OMNIPAQUE IOHEXOL 350 MG/ML SOLN COMPARISON:  CT chest, abdomen, pelvis 09/08/2020. FINDINGS: CTA CHEST FINDINGS Cardiovascular: Left chest wall Port-A-Cath with tip terminating in the region of the superior cavoatrial junction. Satisfactory opacification of the pulmonary arteries to the segmental level. Stable tiny nonocclusive central and left lower lobe segmental pulmonary embolus (124, 3:127). No new pulmonary embolus. Normal heart size. No pericardial effusion. At least mild left anterior descending coronary artery calcifications. Mediastinum/Nodes: Interval increase in size 1.6 cm (from 1.2 cm) right hilar lymph node. No left hilar lymph node. Stable borderline enlarged 1.1 cm prevascular lymph node. (3:103). No axillary lymphadenopathy. Lungs/Pleura: Interval increase in size of a lobulated right upper lobe 2.7 x 3.7 X 3.3cm (from 2.4 x 3.6 x 2.8 cm) pulmonary mass. Interval increase in conspicuity and increase in size of a nodule like right lower lobe density measuring up to 2.6 cm (from 1.5 cm) (4:85). Interval decrease in size of a trace right pleural effusion. No left  pleural effusion. No pneumothorax. Musculoskeletal: Redemonstration of a densely sclerotic left T12 posterior rib lesion (11:77). Redemonstration of a punctate densely sclerotic lesion within the left anterolateral third and posterolateral 7th ribs. No acute displaced rib fracture. No acute displaced sternal fracture. Please see below regarding spine. Review of the  MIP images confirms the above findings. CT ABDOMEN and PELVIS FINDINGS Hepatobiliary: Interval increase in size of a right hepatic lobe 4.1 x 2.7 cm (from 3 x 2.1 cm) hypodense lesion (1:28). Interval increase in size and conspicuity of a right inferior hepatic lobe 4.3 x 2.1 (from 2.3 x 1 cm) lesion (1:39). Some areas of the liver appears slightly heterogeneous (1:19) with no definite lesion. No definite evidence of a new hepatic lesion on this single phase contrast study. No gallstones, gallbladder wall thickening, or pericholecystic fluid. No biliary dilatation. Pancreas: No focal lesion. Normal pancreatic contour. No surrounding inflammatory changes. No main pancreatic ductal dilatation. Spleen: Normal in size without focal abnormality. Adrenals/Urinary Tract: No adrenal nodule bilaterally. Bilateral kidneys enhance symmetrically. No nephroureterolithiasis. No hydronephrosis. No hydroureter. On delayed imaging, there is no urothelial wall thickening and there are no filling defects in the opacified portions of the bilateral collecting systems or ureters. The urinary bladder is unremarkable. Stomach/Bowel: Stomach is within normal limits. No evidence of bowel wall thickening or dilatation. Stool throughout the colon. Appendix appears normal. Vascular/Lymphatic: No abdominal aorta or iliac aneurysm. Mild to moderate calcified and noncalcified atherosclerotic plaque of the aorta and its branches. Stable prominent retrocrural lymph nodes measuring up to 8 mm (1:23). Stable prominent retroperitoneal lymph nodes. Stable to slightly decreased in size  enlarged right external iliac lymph node open (1:69). Now measuring 1.2 cm (from 1.5 cm). No abdominal or inguinal lymphadenopathy. Reproductive: Prostate is unremarkable. Other: No intraperitoneal free fluid. No intraperitoneal free gas. No organized fluid collection. Musculoskeletal: No acute displaced pelvic fracture. Partially visualized left proximal femur intramedullary nail fixation. No acute displaced fracture of the sacrum. Interval increase in size of a right iliac wing lytic lesion with cortical break (1:68). Redemonstration of a left iliac wing lytic lesion that extends to the superior/fall sacroiliac joint (5:92). Redemonstration of a lytic left sacral lesion (1:68, 5:103). CT THORACIC SPINE: Alignment: Twelve rib-bearing thoracic vertebral bodies are noted. Normal. Vertebrae: Multilevel mild-to-moderate degenerative changes of the spine. Slightly more conspicuous lytic lesion and cortical destruction of the right T11 vertebral body, lamina, and superior facet (11:58, 8:118). Interval development of superior endplate T11 concavity and vague vertebral body height loss. No acute displaced fracture. Paraspinal and other soft tissues: Unremarkable. Disc levels: Maintained. CT LUMBAR SPINE: Alignment: 5 non-rib-bearing lumbar vertebral bodies are noted. Normal. Vertebrae: No acute displaced fracture. No suspicious lytic or blastic osseous lesions. Paraspinal and other soft tissues: Unremarkable Disc levels: Maintained. Review of the MIP images confirms the above findings. IMPRESSION: 1. Interval development of a pathologic compression fracture with less than 5% height loss of the T11 vertebral body with persistent right vertebral body lytic lesion that extends to the posterior elements. 2. Stable tiny nonocclusive central and left lower lobe segmental pulmonary embolus. 3. Interval increase in size of a right upper lobe pulmonary mass and right lower lobe nodular-like lesion. Associated stable to slightly  increased right hilar and mediastinal lymph nodes. 4. Interval increase in size of hepatic metastatic lesions. 5. Some stable and some increased in size axial and appendicular skeleton lytic metastatic lesion. 6. Slight interval decrease in trace right pleural effusion. 7.  Aortic Atherosclerosis (ICD10-I70.0). Electronically Signed   By: Iven Finn M.D.   On: 10/10/2020 19:00   CT L-SPINE NO CHARGE  Result Date: 10/10/2020 CLINICAL DATA:  Bilateral flank pain.  Stage IV metastatic disease. EXAM: CT ANGIOGRAPHY CHEST CT ABDOMEN AND PELVIS WITH CONTRAST CT THORACIC SPINE CT LUMBAR SPINE TECHNIQUE: Multidetector  CT imaging of the chest was performed using the standard protocol during bolus administration of intravenous contrast. Multiplanar CT image reconstructions and MIPs were obtained to evaluate the vascular anatomy. Multidetector CT imaging of the abdomen and pelvis was performed using the standard protocol during bolus administration of intravenous contrast. Multidetector CT images of the thoracic were obtained using the standard protocol without intravenous contrast. Multidetector CT images of the lumbar were obtained using the standard protocol without intravenous contrast. CONTRAST:  179mL OMNIPAQUE IOHEXOL 350 MG/ML SOLN COMPARISON:  CT chest, abdomen, pelvis 09/08/2020. FINDINGS: CTA CHEST FINDINGS Cardiovascular: Left chest wall Port-A-Cath with tip terminating in the region of the superior cavoatrial junction. Satisfactory opacification of the pulmonary arteries to the segmental level. Stable tiny nonocclusive central and left lower lobe segmental pulmonary embolus (124, 3:127). No new pulmonary embolus. Normal heart size. No pericardial effusion. At least mild left anterior descending coronary artery calcifications. Mediastinum/Nodes: Interval increase in size 1.6 cm (from 1.2 cm) right hilar lymph node. No left hilar lymph node. Stable borderline enlarged 1.1 cm prevascular lymph node.  (3:103). No axillary lymphadenopathy. Lungs/Pleura: Interval increase in size of a lobulated right upper lobe 2.7 x 3.7 X 3.3cm (from 2.4 x 3.6 x 2.8 cm) pulmonary mass. Interval increase in conspicuity and increase in size of a nodule like right lower lobe density measuring up to 2.6 cm (from 1.5 cm) (4:85). Interval decrease in size of a trace right pleural effusion. No left pleural effusion. No pneumothorax. Musculoskeletal: Redemonstration of a densely sclerotic left T12 posterior rib lesion (11:77). Redemonstration of a punctate densely sclerotic lesion within the left anterolateral third and posterolateral 7th ribs. No acute displaced rib fracture. No acute displaced sternal fracture. Please see below regarding spine. Review of the MIP images confirms the above findings. CT ABDOMEN and PELVIS FINDINGS Hepatobiliary: Interval increase in size of a right hepatic lobe 4.1 x 2.7 cm (from 3 x 2.1 cm) hypodense lesion (1:28). Interval increase in size and conspicuity of a right inferior hepatic lobe 4.3 x 2.1 (from 2.3 x 1 cm) lesion (1:39). Some areas of the liver appears slightly heterogeneous (1:19) with no definite lesion. No definite evidence of a new hepatic lesion on this single phase contrast study. No gallstones, gallbladder wall thickening, or pericholecystic fluid. No biliary dilatation. Pancreas: No focal lesion. Normal pancreatic contour. No surrounding inflammatory changes. No main pancreatic ductal dilatation. Spleen: Normal in size without focal abnormality. Adrenals/Urinary Tract: No adrenal nodule bilaterally. Bilateral kidneys enhance symmetrically. No nephroureterolithiasis. No hydronephrosis. No hydroureter. On delayed imaging, there is no urothelial wall thickening and there are no filling defects in the opacified portions of the bilateral collecting systems or ureters. The urinary bladder is unremarkable. Stomach/Bowel: Stomach is within normal limits. No evidence of bowel wall thickening or  dilatation. Stool throughout the colon. Appendix appears normal. Vascular/Lymphatic: No abdominal aorta or iliac aneurysm. Mild to moderate calcified and noncalcified atherosclerotic plaque of the aorta and its branches. Stable prominent retrocrural lymph nodes measuring up to 8 mm (1:23). Stable prominent retroperitoneal lymph nodes. Stable to slightly decreased in size enlarged right external iliac lymph node open (1:69). Now measuring 1.2 cm (from 1.5 cm). No abdominal or inguinal lymphadenopathy. Reproductive: Prostate is unremarkable. Other: No intraperitoneal free fluid. No intraperitoneal free gas. No organized fluid collection. Musculoskeletal: No acute displaced pelvic fracture. Partially visualized left proximal femur intramedullary nail fixation. No acute displaced fracture of the sacrum. Interval increase in size of a right iliac wing lytic lesion with cortical  break (1:68). Redemonstration of a left iliac wing lytic lesion that extends to the superior/fall sacroiliac joint (5:92). Redemonstration of a lytic left sacral lesion (1:68, 5:103). CT THORACIC SPINE: Alignment: Twelve rib-bearing thoracic vertebral bodies are noted. Normal. Vertebrae: Multilevel mild-to-moderate degenerative changes of the spine. Slightly more conspicuous lytic lesion and cortical destruction of the right T11 vertebral body, lamina, and superior facet (11:58, 8:118). Interval development of superior endplate T11 concavity and vague vertebral body height loss. No acute displaced fracture. Paraspinal and other soft tissues: Unremarkable. Disc levels: Maintained. CT LUMBAR SPINE: Alignment: 5 non-rib-bearing lumbar vertebral bodies are noted. Normal. Vertebrae: No acute displaced fracture. No suspicious lytic or blastic osseous lesions. Paraspinal and other soft tissues: Unremarkable Disc levels: Maintained. Review of the MIP images confirms the above findings. IMPRESSION: 1. Interval development of a pathologic compression  fracture with less than 5% height loss of the T11 vertebral body with persistent right vertebral body lytic lesion that extends to the posterior elements. 2. Stable tiny nonocclusive central and left lower lobe segmental pulmonary embolus. 3. Interval increase in size of a right upper lobe pulmonary mass and right lower lobe nodular-like lesion. Associated stable to slightly increased right hilar and mediastinal lymph nodes. 4. Interval increase in size of hepatic metastatic lesions. 5. Some stable and some increased in size axial and appendicular skeleton lytic metastatic lesion. 6. Slight interval decrease in trace right pleural effusion. 7.  Aortic Atherosclerosis (ICD10-I70.0). Electronically Signed   By: Iven Finn M.D.   On: 10/10/2020 19:00    Procedures Procedures (including critical care time)  Medications Ordered in ED Medications  morphine 4 MG/ML injection 4 mg (4 mg Intravenous Given 10/10/20 1543)  ondansetron (ZOFRAN) injection 4 mg (4 mg Intravenous Given 10/10/20 1543)  lidocaine-prilocaine (EMLA) cream (1 application Topical Given 10/10/20 1537)  iohexol (OMNIPAQUE) 350 MG/ML injection 100 mL (100 mLs Intravenous Contrast Given 10/10/20 1646)  HYDROmorphone (DILAUDID) injection 1 mg (1 mg Intravenous Given 10/10/20 1809)  morphine 4 MG/ML injection 8 mg (8 mg Intravenous Given 10/10/20 2012)  heparin lock flush 100 unit/mL (500 Units Intracatheter Given 10/10/20 2014)    ED Course  I have reviewed the triage vital signs and the nursing notes.  Pertinent labs & imaging results that were available during my care of the patient were reviewed by me and considered in my medical decision making (see chart for details).    MDM Rules/Calculators/A&P                          Patient presents with back pain with sudden onset.  No focal neurologic deficits. Patient is nontoxic appearing, afebrile, not tachypneic, not hypotensive, maintains excellent SPO2 on room air.  I have  reviewed the patient's chart to obtain more information.   I reviewed and interpreted the patient's labs and radiological studies. Evidence of T11 compression fracture with small amount of height loss.  Also worsening of patient's metastatic lesions.  These findings were discussed with the patient. Patient was offered admission for pain management. Patient states "I would much rather go home than stay for pain management. I want to be home for Christmas because I know it's going to be my last one." Return precautions discussed.  Patient voices understanding of these instructions, accepts the plan, and is comfortable with discharge.  Findings and plan of care discussed with attending physician, Blanchie Dessert, MD.     Vitals:   10/10/20 1830 10/10/20 1900 10/10/20 1930  10/10/20 2009  BP: (!) 121/94 124/88 (!) 132/98   Pulse: 100 95 99   Resp: 18  20   Temp:    97.7 F (36.5 C)  TempSrc:    Oral  SpO2: 97% 98% 96%   Weight:      Height:        Final Clinical Impression(s) / ED Diagnoses Final diagnoses:  Back pain  Compression fracture of T11 vertebra, initial encounter [facility])    Rx / DC Orders ED Discharge Orders    None       Layla Maw 10/11/20 0023    Blanchie Dessert, MD 10/11/20 1504

## 2020-10-10 NOTE — ED Triage Notes (Signed)
Pt BIB by [facility] from home for bilateral mid/lower back pain x3 days. "Right side might be a tumor". Two doses of oxycodone and fentanyl patch with no relief. Waited 3 hrs at [facility], went home, called back. Oncology recommended he come to [facility]. Blood work done at oncologist 3 days ago. Hx metastatic lung cancer stage 4.   BP 178/96 HR 100 RR 18 SpO2 98% RA

## 2020-10-13 ENCOUNTER — Telehealth: Payer: Self-pay | Admitting: Pulmonary Disease

## 2020-10-13 ENCOUNTER — Telehealth: Payer: Self-pay

## 2020-10-13 ENCOUNTER — Telehealth: Payer: Self-pay | Admitting: Medical Oncology

## 2020-10-13 NOTE — Telephone Encounter (Signed)
Returned patient's daughter's call from 10/10/2020. Mike [name]) She called as her father was at [facility] in the ED. He was not seen and was brought by EMS to [facility] ED. Patient was having excruciating pain and she was trying to get in touch with Dr. Julien Nordmann. He has a compression fx in his back and with arthritis he can barely get to his bsc with a walker. He has an appointment with Dr. Valeta Harms coming soon. Daughter was asking about hospice and palliaitve care. Encouraged her to discuss this with her father and Dr. Valeta Harms and possibly move up his appointment. Advised he may need more or different management of his pain meds. Advised will update Dr.Kinard.

## 2020-10-13 NOTE — Telephone Encounter (Signed)
Thanks, I reviewed. Nothing additional  Garner Nash, DO [facility] 10/13/2020 10:01 AM

## 2020-10-13 NOTE — Telephone Encounter (Signed)
lmtcb for pt to get more information. Pt was seen in ED on 12.24.21 for compression fx and to also see what number is best and what time of day is best to receive calls.

## 2020-10-13 NOTE — Telephone Encounter (Signed)
Pt diagnosed with Compression fx and worsening metastatic disease by [facility] ED provider.  Next weekly lab is Thursday  F/u 1/6

## 2020-10-13 NOTE — Telephone Encounter (Signed)
Spoke with the pt's significant other, Lattie Haw  She states the pt went to ED 10/10/20 with severe back pain  He was dx with compression fracture  He also had a CT angio  Lattie Haw states just wants to make Dr Valeta Harms aware so he can review the ct  Please review and advised any recs

## 2020-10-13 NOTE — Telephone Encounter (Signed)
He just started new treatment.  We will have to wait and see.  Thank you.

## 2020-10-13 NOTE — Telephone Encounter (Signed)
I called and spoke with the pt's d

## 2020-10-14 ENCOUNTER — Other Ambulatory Visit: Payer: Self-pay | Admitting: Internal Medicine

## 2020-10-14 ENCOUNTER — Encounter: Payer: Self-pay | Admitting: Internal Medicine

## 2020-10-14 DIAGNOSIS — G893 Neoplasm related pain (acute) (chronic): Secondary | ICD-10-CM

## 2020-10-14 DIAGNOSIS — C3491 Malignant neoplasm of unspecified part of right bronchus or lung: Secondary | ICD-10-CM

## 2020-10-14 MED ORDER — OXYCODONE-ACETAMINOPHEN 5-325 MG PO TABS: 1.0000 | ORAL_TABLET | Freq: Four times a day (QID) | ORAL | 0 refills | Status: DC | PRN

## 2020-10-15 ENCOUNTER — Other Ambulatory Visit: Payer: Self-pay | Admitting: *Deleted

## 2020-10-15 NOTE — Patient Outreach (Signed)
[facility]) Care Management  10/15/2020  Mike [name] [birth year] [id number]   Outreach attempt #2, successful.  Member report he has been dealing with increased pain over the last few weeks, was seen in the ED on 12/24, diagnosed with compression fracture.  His Fentanyl patch has been increased from 75mcg to 7mcg, changing patch every 3 days.  He has Oxycodone/Acetaminophen 5/325 for breakthrough, will discuss increasing this to 10/325 during tomorrow's visit at the cancer center.  He is scheduled for labs and port flush, next chemo infusion scheduled for 1/6.    Has appointment with Dr. Earlie Server on 1/27 but state he will more than likely see him during visit this week or next.  He is anticipating a repeat CT within the next 3 weeks.  Per chart, there was question about starting hospice/palliative care.  Discussed with member possibility of placing order for services, he prefer to wait and discuss with Dr. Valeta Harms first, at which point he will decide if he would like to proceed.  Report he now has hospital bed, state he is sleeping well.  He is not as mobile, but state he does get up in chair and reposition often to decrease risk of pressure ulcer.  State he appetite has remained good.  Denies any urgent concerns, encouraged to contact this care manager with questions.  Agrees to follow up within the next 2 weeks.  Goals Addressed            This Visit's Progress   . [facility] - Keep Pain Under Control       Follow Up Date 10/29/2020  Timeframe:  Long-Range Goal Priority:  High Start Date:      10/15/2020                       Expected End Date:        12/15/2020                 - develop a personal pain management plan - keep track of prescription refills - learn relaxation techniques - plan exercise or activity when pain is best controlled    Why is this important?   Day-to-day life can be hard when you have pain.  Even a small change in emotion or a physical problem  can make pain better or worse.  Coping with pain depends on how the mind and body reacts to pain.  Pain medicine is just one piece of the treatment puzzle.  There are many tools to help manage pain. A combination of them can be used to best meet your needs.     Notes:   11/22 - completed - report pain in manageable  12/29 - Discussed increase in pain meds (Fentanyl patch), will request increase in breakthrough pain meds    . [facility] - Make and Keep All Appointments   On track    Follow Up Date 10/29/2020  Timeframe:  Short-Term Goal Priority:  Medium Start Date:        10/15/2020                     Expected End Date:          11/15/2020               - ask family or friend for a ride - call to cancel if needed - keep a calendar with appointment dates    Why is this important?  Part of staying healthy is seeing the doctor for follow-up care.  If you forget your appointments, there are some things you can do to stay on track.    Notes:  Referral to CSW for transportation  11/22 - upcoming appointments reviewed    . [facility] - Manage Fatigue (Tiredness)   On track    Follow Up Date 10/29/2020  Timeframe:  Short-Term Goal Priority:  Medium Start Date:       10/15/2020                      Expected End Date:     11/15/2020                    - eat healthy - maintain healthy weight - use meditation or relaxation techniques    Why is this important?   Cancer treatment and its side effects can drain your energy. It can keep you from doing things you would like to do.  There are many things that you can do to manage fatigue.    Notes:       Valente David, RN, MSN [place] [phone]

## 2020-10-15 NOTE — Patient Instructions (Signed)
Goals Addressed            This Visit's Progress   . [facility] - Keep Pain Under Control       Follow Up Date 10/29/2020  Timeframe:  Long-Range Goal Priority:  High Start Date:      10/15/2020                       Expected End Date:        12/15/2020                 - develop a personal pain management plan - keep track of prescription refills - learn relaxation techniques - plan exercise or activity when pain is best controlled    Why is this important?   Day-to-day life can be hard when you have pain.  Even a small change in emotion or a physical problem can make pain better or worse.  Coping with pain depends on how the mind and body reacts to pain.  Pain medicine is just one piece of the treatment puzzle.  There are many tools to help manage pain. A combination of them can be used to best meet your needs.     Notes:   11/22 - completed - report pain in manageable  12/29 - Discussed increase in pain meds (Fentanyl patch), will request increase in breakthrough pain meds    . [facility] - Make and Keep All Appointments   On track    Follow Up Date 10/29/2020  Timeframe:  Short-Term Goal Priority:  Medium Start Date:        10/15/2020                     Expected End Date:          11/15/2020               - ask family or friend for a ride - call to cancel if needed - keep a calendar with appointment dates    Why is this important?   Part of staying healthy is seeing the doctor for follow-up care.  If you forget your appointments, there are some things you can do to stay on track.    Notes:  Referral to CSW for transportation  11/22 - upcoming appointments reviewed    . [facility] - Manage Fatigue (Tiredness)   On track    Follow Up Date 10/29/2020  Timeframe:  Short-Term Goal Priority:  Medium Start Date:       10/15/2020                      Expected End Date:     11/15/2020                    - eat healthy - maintain healthy weight - use meditation or relaxation  techniques    Why is this important?   Cancer treatment and its side effects can drain your energy. It can keep you from doing things you would like to do.  There are many things that you can do to manage fatigue.    Notes:

## 2020-10-16 ENCOUNTER — Other Ambulatory Visit: Payer: Self-pay

## 2020-10-16 ENCOUNTER — Telehealth: Payer: Self-pay | Admitting: Medical

## 2020-10-16 ENCOUNTER — Ambulatory Visit: Payer: 59

## 2020-10-16 ENCOUNTER — Ambulatory Visit: Payer: 59 | Admitting: Internal Medicine

## 2020-10-16 ENCOUNTER — Inpatient Hospital Stay: Payer: 59

## 2020-10-16 ENCOUNTER — Other Ambulatory Visit: Payer: 59

## 2020-10-16 DIAGNOSIS — Z5112 Encounter for antineoplastic immunotherapy: Secondary | ICD-10-CM | POA: Diagnosis not present

## 2020-10-16 DIAGNOSIS — C3491 Malignant neoplasm of unspecified part of right bronchus or lung: Secondary | ICD-10-CM

## 2020-10-16 LAB — CBC WITH DIFFERENTIAL (CANCER CENTER ONLY)
Abs Immature Granulocytes: 1.33 10*3/uL — ABNORMAL HIGH (ref 0.00–0.07)
Basophils Absolute: 0.1 10*3/uL (ref 0.0–0.1)
Basophils Relative: 0 %
Eosinophils Absolute: 0 10*3/uL (ref 0.0–0.5)
Eosinophils Relative: 0 %
HCT: 32.5 % — ABNORMAL LOW (ref 39.0–52.0)
Hemoglobin: 10 g/dL — ABNORMAL LOW (ref 13.0–17.0)
Immature Granulocytes: 8 %
Lymphocytes Relative: 5 %
Lymphs Abs: 0.7 10*3/uL (ref 0.7–4.0)
MCH: 27.8 pg (ref 26.0–34.0)
MCHC: 30.8 g/dL (ref 30.0–36.0)
MCV: 90.3 fL (ref 80.0–100.0)
Monocytes Absolute: 0.9 10*3/uL (ref 0.1–1.0)
Monocytes Relative: 6 %
Neutro Abs: 13.2 10*3/uL — ABNORMAL HIGH (ref 1.7–7.7)
Neutrophils Relative %: 81 %
Platelet Count: 171 10*3/uL (ref 150–400)
RBC: 3.6 MIL/uL — ABNORMAL LOW (ref 4.22–5.81)
RDW: 18.8 % — ABNORMAL HIGH (ref 11.5–15.5)
WBC Count: 16.2 10*3/uL — ABNORMAL HIGH (ref 4.0–10.5)
nRBC: 0.4 % — ABNORMAL HIGH (ref 0.0–0.2)

## 2020-10-16 LAB — CMP (CANCER CENTER ONLY)
ALT: 31 U/L (ref 0–44)
AST: 34 U/L (ref 15–41)
Albumin: 2.4 g/dL — ABNORMAL LOW (ref 3.5–5.0)
Alkaline Phosphatase: 154 U/L — ABNORMAL HIGH (ref 38–126)
Anion gap: 5 (ref 5–15)
BUN: 13 mg/dL (ref 6–20)
CO2: 32 mmol/L (ref 22–32)
Calcium: 9 mg/dL (ref 8.9–10.3)
Chloride: 100 mmol/L (ref 98–111)
Creatinine: 0.63 mg/dL (ref 0.61–1.24)
GFR, Estimated: >60 mL/min (ref >60)
Glucose, Bld: 94 mg/dL (ref 70–99)
Potassium: 4.4 mmol/L (ref 3.5–5.1)
Sodium: 137 mmol/L (ref 135–145)
Total Bilirubin: <0.2 mg/dL — ABNORMAL LOW (ref 0.3–1.2)
Total Protein: 6.7 g/dL (ref 6.5–8.1)

## 2020-10-16 NOTE — Telephone Encounter (Signed)
Scheduled appt per 12/30 sch msg - left message for patient with appt date and time

## 2020-10-16 NOTE — Progress Notes (Signed)
Patient here for port flush and lab draw. States that his left shoulder is killing him. Michela Pitcher he is taking the percocet 2 tablets every 6 hours and is changing the fentanyl patch every 3 days and still in a lot of pain.

## 2020-10-19 NOTE — Progress Notes (Deleted)
[facility] Progress Note   VINAYAK BOBIER [id number] Oct 06, 1966 55 y.o.  HADY NIEMCZYK is managed by Dr. Fanny Bien. [name]  Actively treated with chemotherapy/immunotherapy/hormonal therapy: yes  Current therapy: ***  Last treated: *** / *** / ***  Next scheduled appointment with provider: 10/23/2020  Assessment: Plan:    Stage IV squamous cell carcinoma of right lung (HCC)  Bony metastasis ([place])  Acute pain of left shoulder  Please see After Visit Summary for patient specific instructions.  Future Appointments  Date Time Provider [place]  10/20/2020  1:00 PM Harle Stanford., PA-C [facility] None  10/23/2020 10:15 AM [facility] LAB [facility] None  10/23/2020 10:30 AM [facility] [place] FLUSH [facility] None  10/23/2020 11:00 AM [name], [name], PA-C [facility] None  10/23/2020 12:15 PM [facility] INFUSION [facility] None  10/29/2020 12:30 PM Valente David, RN [facility] None  10/30/2020 10:45 AM [facility] LAB [facility] None  10/30/2020 11:00 AM [facility] [place] FLUSH [facility] None  11/06/2020 10:45 AM [facility] LAB [facility] None  11/06/2020 11:00 AM [facility] [place] FLUSH [facility] None  11/13/2020  8:30 AM [facility] LAB [facility] None  11/13/2020  8:45 AM [facility] [place] FLUSH [facility] None  11/13/2020  9:15 AM [name], MD [facility] None  11/13/2020 10:15 AM [facility] INFUSION [facility] None  12/02/2020  8:45 AM [facility] LAB [facility] None  12/02/2020  9:00 AM [facility] [place] FLUSH [facility] None  12/02/2020  9:30 AM [name], MD [facility] None  12/05/2020  8:00 AM [facility] LAB [facility]  12/12/2020  2:00 PM [name], MD [facility]    No orders of the defined types were placed in this encounter.      Subjective:   Patient ID:  MAHIR PRABHAKAR is a 55 y.o. (DOB 1966/04/26) male.  Chief Complaint: No chief complaint on file.   HPI DEVIAN BARTOLOMEI  is a 55 y.o. male with a diagnosis of ***    Medications: I have reviewed the patient's current medications.  Allergies: No Known Allergies  Past Medical History:  Diagnosis Date  . Arthritis    neck  . 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 Diabetes ([place])   . Fatty liver 12/05/2019   By [place] 11/2019  . GERD (gastroesophageal reflux disease)   . 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 High cholesterol   . IBS (irritable bowel syndrome)   . lung ca dx'd 10/2019   lung stage 4   . OSA (obstructive sleep apnea) 09/15/2019   Sleep study 12/2017 - severe OSA with AHI 65.6, desat to 76% rec CPAP  . Severe obesity (BMI 35.0-39.9) with comorbidity ([place]) 09/04/2019    Past Surgical History:  Procedure Laterality Date  . BIOPSY  12/18/2019   Procedure: BIOPSY;  Surgeon: Garner Nash, DO;  Location: [facility];  Service: Pulmonary;;  . BRONCHIAL BRUSHINGS  12/18/2019   Procedure: BRONCHIAL BRUSHINGS;  Surgeon: Garner Nash, DO;  Location: [facility];  Service: Pulmonary;;  . BRONCHIAL WASHINGS  12/18/2019   Procedure: BRONCHIAL WASHINGS;  Surgeon: Garner Nash, DO;  Location: [facility];  Service: Pulmonary;;  . COLONOSCOPY  10/2019   mult TA, int hem, rpt 1 yr ([place])  . CYSTECTOMY     knee- left knee cyst  . ENDOBRONCHIAL ULTRASOUND  12/18/2019   Procedure: ENDOBRONCHIAL ULTRASOUND;  Surgeon: Garner Nash, DO;  Location: [facility];  Service: Pulmonary;;  . FINE NEEDLE ASPIRATION  12/18/2019   Procedure:  FINE NEEDLE ASPIRATION;  Surgeon: Garner Nash, DO;  Location: [facility];  Service: Pulmonary;;  . INTRAMEDULLARY (IM) NAIL INTERTROCHANTERIC Left 07/25/2020   Procedure: LEFT INTERTROCHANTERIC INTRAMEDULLARY (IM) NAIL;  Surgeon: Leandrew Koyanagi, MD;  Location: [facility];  Service: Orthopedics;  Laterality: Left;  . IR IMAGING GUIDED PORT INSERTION  12/27/2019  . PILONIDAL CYST EXCISION    . VIDEO BRONCHOSCOPY WITH ENDOBRONCHIAL ULTRASOUND Right 12/18/2019   Procedure: VIDEO BRONCHOSCOPY;   Surgeon: Garner Nash, DO;  Location: [facility];  Service: Pulmonary;  Laterality: Right;    Family History  Problem Relation Age of Onset  . CAD Mother        stents  . Stroke Neg Hx   . Diabetes Neg Hx   . Cancer Neg Hx   . Colon cancer Neg Hx   . Esophageal cancer Neg Hx   . Stomach cancer Neg Hx   . Rectal cancer Neg Hx     Social History   Socioeconomic History  . Marital status: Divorced    Spouse name: Not on file  . Number of children: Not on file  . Years of education: 76  . Highest education level: 12th grade  Occupational History  . Occupation: Applying for Disability  Tobacco Use  . Smoking status: Current Every Day Smoker    Packs/day: 3.00    Years: 40.00    Pack years: 120.00    Last attempt to quit: 01/18/2020    Years since quitting: 0.7  . Smokeless tobacco: Never Used  . Tobacco comment: Currently smoking about 5 cigs a day  Vaping Use  . Vaping Use: Never used  Substance and Sexual Activity  . Alcohol use: Yes    Comment: Rarely  . Drug use: Never  . Sexual activity: Yes    Partners: Female  Other Topics Concern  . Not on file  Social History Narrative   Lives with brother Claiborne Billings) and fiancee Mamie Nick)   64 yo son died remotely   25 yo son died 2020-04-11 of presumed MI (morbidly obese)   Occ: [employer]    Edu: HS, self taught [employer]   Activity:    Diet:    Social Determinants of Health   Financial Resource Strain: Low Risk   . Difficulty of Paying Living Expenses: Not hard at all  Food Insecurity: No Food Insecurity  . Worried About Charity fundraiser in the Last Year: Never true  . Ran Out of Food in the Last Year: Never true  Transportation Needs: No Transportation Needs  . Lack of Transportation (Medical): No  . Lack of Transportation (Non-Medical): No  Physical Activity: Inactive  . Days of Exercise per Week: 0 days  . Minutes of Exercise per Session: 0 min  Stress: No Stress Concern Present  .  Feeling of Stress : Only a little  Social Connections: Moderately Isolated  . Frequency of Communication with Friends and Family: More than three times a week  . Frequency of Social Gatherings with Friends and Family: More than three times a week  . Attends Religious Services: More than 4 times per year  . Active Member of Clubs or Organizations: No  . Attends Archivist Meetings: Never  . Marital Status: Divorced  Human resources officer Violence: Not At Risk  . Fear of Current or Ex-Partner: No  . Emotionally Abused: No  . Physically Abused: No  . Sexually Abused: No    Past Medical History, Surgical history,  Social history, and Family history were reviewed and updated as appropriate.   Please see review of systems for further details on the patient's review from today.   Review of Systems:  Review of Systems  Constitutional: Negative for chills, diaphoresis and fever.  HENT: Negative for trouble swallowing and voice change.   Respiratory: Negative for cough, chest tightness, shortness of breath and wheezing.   Cardiovascular: Negative for chest pain and palpitations.  Gastrointestinal: Negative for abdominal pain, constipation, diarrhea, nausea and vomiting.  Genitourinary: Negative for difficulty urinating, dysuria and frequency.  Musculoskeletal: Positive for arthralgias. Negative for back pain and myalgias.  Neurological: Negative for dizziness, light-headedness and headaches.    Objective:   Physical Exam:  There were no vitals taken for this visit. ECOG: 1  Physical Exam Constitutional:      General: He is not in acute distress.    Appearance: He is not diaphoretic.  HENT:     Head: Normocephalic and atraumatic.  Eyes:     General: No scleral icterus.       Right eye: No discharge.        Left eye: No discharge.  Cardiovascular:     Rate and Rhythm: Normal rate and regular rhythm.     Heart sounds: Normal heart sounds. No murmur heard. No friction rub. No  gallop.   Pulmonary:     Effort: Pulmonary effort is normal. No respiratory distress.     Breath sounds: Normal breath sounds. No wheezing or rales.  Skin:    General: Skin is warm and dry.     Findings: No erythema or rash.  Neurological:     Mental Status: He is alert.     Lab Review:     Component Value Date/Time   NA 137 10/16/2020 1045   K 4.4 10/16/2020 1045   CL 100 10/16/2020 1045   CO2 32 10/16/2020 1045   GLUCOSE 94 10/16/2020 1045   BUN 13 10/16/2020 1045   CREATININE 0.63 10/16/2020 1045   CALCIUM 9.0 10/16/2020 1045   PROT 6.7 10/16/2020 1045   ALBUMIN 2.4 (L) 10/16/2020 1045   AST 34 10/16/2020 1045   ALT 31 10/16/2020 1045   ALKPHOS 154 (H) 10/16/2020 1045   BILITOT <0.2 (L) 10/16/2020 1045   GFRNONAA >60 10/16/2020 1045   GFRAA >60 07/08/2020 0808       Component Value Date/Time   WBC 16.2 (H) 10/16/2020 1045   WBC 5.5 10/10/2020 1524   RBC 3.60 (L) 10/16/2020 1045   HGB 10.0 (L) 10/16/2020 1045   HCT 32.5 (L) 10/16/2020 1045   PLT 171 10/16/2020 1045   MCV 90.3 10/16/2020 1045   MCH 27.8 10/16/2020 1045   MCHC 30.8 10/16/2020 1045   RDW 18.8 (H) 10/16/2020 1045   LYMPHSABS 0.7 10/16/2020 1045   MONOABS 0.9 10/16/2020 1045   EOSABS 0.0 10/16/2020 1045   BASOSABS 0.1 10/16/2020 1045   -------------------------------  Imaging from last 24 hours (if applicable):  Radiology interpretation: DG Cervical Spine 2-3 Views  Result Date: 10/02/2020 CLINICAL DATA:  Left-sided neck pain history of lung cancer EXAM: CERVICAL SPINE - 2-3 VIEW COMPARISON:  MRI 09/02/2014 FINDINGS: Mild reversal of cervical lordosis. Vertebral body heights are normal. Mild to moderate degenerative changes C4-C5, C5-C6 and C6-C7. The dens and lateral masses are within normal limits IMPRESSION: Mild to moderate degenerative changes, most advanced C4 through C7. No acute osseous abnormality. Electronically Signed   By: Donavan Foil M.D.   On: 10/02/2020 17:00  DG Clavicle  Left  Result Date: 10/02/2020 CLINICAL DATA:  Left clavicle pain EXAM: LEFT CLAVICLE - 2+ VIEWS COMPARISON:  None. FINDINGS: No fracture or malalignment.  Mild AC joint degenerative change. IMPRESSION: No acute osseous abnormality. Electronically Signed   By: Donavan Foil M.D.   On: 10/02/2020 17:01   CT Angio Chest PE W and/or Wo Contrast  Result Date: 10/10/2020 CLINICAL DATA:  Bilateral flank pain.  Stage IV metastatic disease. EXAM: CT ANGIOGRAPHY CHEST CT ABDOMEN AND PELVIS WITH CONTRAST CT THORACIC SPINE CT LUMBAR SPINE TECHNIQUE: Multidetector CT imaging of the chest was performed using the standard protocol during bolus administration of intravenous contrast. Multiplanar CT image reconstructions and MIPs were obtained to evaluate the vascular anatomy. Multidetector CT imaging of the abdomen and pelvis was performed using the standard protocol during bolus administration of intravenous contrast. Multidetector CT images of the thoracic were obtained using the standard protocol without intravenous contrast. Multidetector CT images of the lumbar were obtained using the standard protocol without intravenous contrast. CONTRAST:  162mL OMNIPAQUE IOHEXOL 350 MG/ML SOLN COMPARISON:  CT chest, abdomen, pelvis 09/08/2020. FINDINGS: CTA CHEST FINDINGS Cardiovascular: Left chest wall Port-A-Cath with tip terminating in the region of the superior cavoatrial junction. Satisfactory opacification of the pulmonary arteries to the segmental level. Stable tiny nonocclusive central and left lower lobe segmental pulmonary embolus (124, 3:127). No new pulmonary embolus. Normal heart size. No pericardial effusion. At least mild left anterior descending coronary artery calcifications. Mediastinum/Nodes: Interval increase in size 1.6 cm (from 1.2 cm) right hilar lymph node. No left hilar lymph node. Stable borderline enlarged 1.1 cm prevascular lymph node. (3:103). No axillary lymphadenopathy. Lungs/Pleura: Interval increase  in size of a lobulated right upper lobe 2.7 x 3.7 X 3.3cm (from 2.4 x 3.6 x 2.8 cm) pulmonary mass. Interval increase in conspicuity and increase in size of a nodule like right lower lobe density measuring up to 2.6 cm (from 1.5 cm) (4:85). Interval decrease in size of a trace right pleural effusion. No left pleural effusion. No pneumothorax. Musculoskeletal: Redemonstration of a densely sclerotic left T12 posterior rib lesion (11:77). Redemonstration of a punctate densely sclerotic lesion within the left anterolateral third and posterolateral 7th ribs. No acute displaced rib fracture. No acute displaced sternal fracture. Please see below regarding spine. Review of the MIP images confirms the above findings. CT ABDOMEN and PELVIS FINDINGS Hepatobiliary: Interval increase in size of a right hepatic lobe 4.1 x 2.7 cm (from 3 x 2.1 cm) hypodense lesion (1:28). Interval increase in size and conspicuity of a right inferior hepatic lobe 4.3 x 2.1 (from 2.3 x 1 cm) lesion (1:39). Some areas of the liver appears slightly heterogeneous (1:19) with no definite lesion. No definite evidence of a new hepatic lesion on this single phase contrast study. No gallstones, gallbladder wall thickening, or pericholecystic fluid. No biliary dilatation. Pancreas: No focal lesion. Normal pancreatic contour. No surrounding inflammatory changes. No main pancreatic ductal dilatation. Spleen: Normal in size without focal abnormality. Adrenals/Urinary Tract: No adrenal nodule bilaterally. Bilateral kidneys enhance symmetrically. No nephroureterolithiasis. No hydronephrosis. No hydroureter. On delayed imaging, there is no urothelial wall thickening and there are no filling defects in the opacified portions of the bilateral collecting systems or ureters. The urinary bladder is unremarkable. Stomach/Bowel: Stomach is within normal limits. No evidence of bowel wall thickening or dilatation. Stool throughout the colon. Appendix appears normal.  Vascular/Lymphatic: No abdominal aorta or iliac aneurysm. Mild to moderate calcified and noncalcified atherosclerotic plaque of the aorta  and its branches. Stable prominent retrocrural lymph nodes measuring up to 8 mm (1:23). Stable prominent retroperitoneal lymph nodes. Stable to slightly decreased in size enlarged right external iliac lymph node open (1:69). Now measuring 1.2 cm (from 1.5 cm). No abdominal or inguinal lymphadenopathy. Reproductive: Prostate is unremarkable. Other: No intraperitoneal free fluid. No intraperitoneal free gas. No organized fluid collection. Musculoskeletal: No acute displaced pelvic fracture. Partially visualized left proximal femur intramedullary nail fixation. No acute displaced fracture of the sacrum. Interval increase in size of a right iliac wing lytic lesion with cortical break (1:68). Redemonstration of a left iliac wing lytic lesion that extends to the superior/fall sacroiliac joint (5:92). Redemonstration of a lytic left sacral lesion (1:68, 5:103). CT THORACIC SPINE: Alignment: Twelve rib-bearing thoracic vertebral bodies are noted. Normal. Vertebrae: Multilevel mild-to-moderate degenerative changes of the spine. Slightly more conspicuous lytic lesion and cortical destruction of the right T11 vertebral body, lamina, and superior facet (11:58, 8:118). Interval development of superior endplate T11 concavity and vague vertebral body height loss. No acute displaced fracture. Paraspinal and other soft tissues: Unremarkable. Disc levels: Maintained. CT LUMBAR SPINE: Alignment: 5 non-rib-bearing lumbar vertebral bodies are noted. Normal. Vertebrae: No acute displaced fracture. No suspicious lytic or blastic osseous lesions. Paraspinal and other soft tissues: Unremarkable Disc levels: Maintained. Review of the MIP images confirms the above findings. IMPRESSION: 1. Interval development of a pathologic compression fracture with less than 5% height loss of the T11 vertebral body with  persistent right vertebral body lytic lesion that extends to the posterior elements. 2. Stable tiny nonocclusive central and left lower lobe segmental pulmonary embolus. 3. Interval increase in size of a right upper lobe pulmonary mass and right lower lobe nodular-like lesion. Associated stable to slightly increased right hilar and mediastinal lymph nodes. 4. Interval increase in size of hepatic metastatic lesions. 5. Some stable and some increased in size axial and appendicular skeleton lytic metastatic lesion. 6. Slight interval decrease in trace right pleural effusion. 7.  Aortic Atherosclerosis (ICD10-I70.0). Electronically Signed   By: Iven Finn M.D.   On: 10/10/2020 19:00   CT ABDOMEN PELVIS W CONTRAST  Result Date: 10/10/2020 CLINICAL DATA:  Bilateral flank pain.  Stage IV metastatic disease. EXAM: CT ANGIOGRAPHY CHEST CT ABDOMEN AND PELVIS WITH CONTRAST CT THORACIC SPINE CT LUMBAR SPINE TECHNIQUE: Multidetector CT imaging of the chest was performed using the standard protocol during bolus administration of intravenous contrast. Multiplanar CT image reconstructions and MIPs were obtained to evaluate the vascular anatomy. Multidetector CT imaging of the abdomen and pelvis was performed using the standard protocol during bolus administration of intravenous contrast. Multidetector CT images of the thoracic were obtained using the standard protocol without intravenous contrast. Multidetector CT images of the lumbar were obtained using the standard protocol without intravenous contrast. CONTRAST:  144mL OMNIPAQUE IOHEXOL 350 MG/ML SOLN COMPARISON:  CT chest, abdomen, pelvis 09/08/2020. FINDINGS: CTA CHEST FINDINGS Cardiovascular: Left chest wall Port-A-Cath with tip terminating in the region of the superior cavoatrial junction. Satisfactory opacification of the pulmonary arteries to the segmental level. Stable tiny nonocclusive central and left lower lobe segmental pulmonary embolus (124, 3:127). No new  pulmonary embolus. Normal heart size. No pericardial effusion. At least mild left anterior descending coronary artery calcifications. Mediastinum/Nodes: Interval increase in size 1.6 cm (from 1.2 cm) right hilar lymph node. No left hilar lymph node. Stable borderline enlarged 1.1 cm prevascular lymph node. (3:103). No axillary lymphadenopathy. Lungs/Pleura: Interval increase in size of a lobulated right upper lobe 2.7 x  3.7 X 3.3cm (from 2.4 x 3.6 x 2.8 cm) pulmonary mass. Interval increase in conspicuity and increase in size of a nodule like right lower lobe density measuring up to 2.6 cm (from 1.5 cm) (4:85). Interval decrease in size of a trace right pleural effusion. No left pleural effusion. No pneumothorax. Musculoskeletal: Redemonstration of a densely sclerotic left T12 posterior rib lesion (11:77). Redemonstration of a punctate densely sclerotic lesion within the left anterolateral third and posterolateral 7th ribs. No acute displaced rib fracture. No acute displaced sternal fracture. Please see below regarding spine. Review of the MIP images confirms the above findings. CT ABDOMEN and PELVIS FINDINGS Hepatobiliary: Interval increase in size of a right hepatic lobe 4.1 x 2.7 cm (from 3 x 2.1 cm) hypodense lesion (1:28). Interval increase in size and conspicuity of a right inferior hepatic lobe 4.3 x 2.1 (from 2.3 x 1 cm) lesion (1:39). Some areas of the liver appears slightly heterogeneous (1:19) with no definite lesion. No definite evidence of a new hepatic lesion on this single phase contrast study. No gallstones, gallbladder wall thickening, or pericholecystic fluid. No biliary dilatation. Pancreas: No focal lesion. Normal pancreatic contour. No surrounding inflammatory changes. No main pancreatic ductal dilatation. Spleen: Normal in size without focal abnormality. Adrenals/Urinary Tract: No adrenal nodule bilaterally. Bilateral kidneys enhance symmetrically. No nephroureterolithiasis. No hydronephrosis.  No hydroureter. On delayed imaging, there is no urothelial wall thickening and there are no filling defects in the opacified portions of the bilateral collecting systems or ureters. The urinary bladder is unremarkable. Stomach/Bowel: Stomach is within normal limits. No evidence of bowel wall thickening or dilatation. Stool throughout the colon. Appendix appears normal. Vascular/Lymphatic: No abdominal aorta or iliac aneurysm. Mild to moderate calcified and noncalcified atherosclerotic plaque of the aorta and its branches. Stable prominent retrocrural lymph nodes measuring up to 8 mm (1:23). Stable prominent retroperitoneal lymph nodes. Stable to slightly decreased in size enlarged right external iliac lymph node open (1:69). Now measuring 1.2 cm (from 1.5 cm). No abdominal or inguinal lymphadenopathy. Reproductive: Prostate is unremarkable. Other: No intraperitoneal free fluid. No intraperitoneal free gas. No organized fluid collection. Musculoskeletal: No acute displaced pelvic fracture. Partially visualized left proximal femur intramedullary nail fixation. No acute displaced fracture of the sacrum. Interval increase in size of a right iliac wing lytic lesion with cortical break (1:68). Redemonstration of a left iliac wing lytic lesion that extends to the superior/fall sacroiliac joint (5:92). Redemonstration of a lytic left sacral lesion (1:68, 5:103). CT THORACIC SPINE: Alignment: Twelve rib-bearing thoracic vertebral bodies are noted. Normal. Vertebrae: Multilevel mild-to-moderate degenerative changes of the spine. Slightly more conspicuous lytic lesion and cortical destruction of the right T11 vertebral body, lamina, and superior facet (11:58, 8:118). Interval development of superior endplate T11 concavity and vague vertebral body height loss. No acute displaced fracture. Paraspinal and other soft tissues: Unremarkable. Disc levels: Maintained. CT LUMBAR SPINE: Alignment: 5 non-rib-bearing lumbar vertebral  bodies are noted. Normal. Vertebrae: No acute displaced fracture. No suspicious lytic or blastic osseous lesions. Paraspinal and other soft tissues: Unremarkable Disc levels: Maintained. Review of the MIP images confirms the above findings. IMPRESSION: 1. Interval development of a pathologic compression fracture with less than 5% height loss of the T11 vertebral body with persistent right vertebral body lytic lesion that extends to the posterior elements. 2. Stable tiny nonocclusive central and left lower lobe segmental pulmonary embolus. 3. Interval increase in size of a right upper lobe pulmonary mass and right lower lobe nodular-like lesion. Associated stable to  slightly increased right hilar and mediastinal lymph nodes. 4. Interval increase in size of hepatic metastatic lesions. 5. Some stable and some increased in size axial and appendicular skeleton lytic metastatic lesion. 6. Slight interval decrease in trace right pleural effusion. 7.  Aortic Atherosclerosis (ICD10-I70.0). Electronically Signed   By: Iven Finn M.D.   On: 10/10/2020 19:00   CT T-SPINE NO CHARGE  Result Date: 10/10/2020 CLINICAL DATA:  Bilateral flank pain.  Stage IV metastatic disease. EXAM: CT ANGIOGRAPHY CHEST CT ABDOMEN AND PELVIS WITH CONTRAST CT THORACIC SPINE CT LUMBAR SPINE TECHNIQUE: Multidetector CT imaging of the chest was performed using the standard protocol during bolus administration of intravenous contrast. Multiplanar CT image reconstructions and MIPs were obtained to evaluate the vascular anatomy. Multidetector CT imaging of the abdomen and pelvis was performed using the standard protocol during bolus administration of intravenous contrast. Multidetector CT images of the thoracic were obtained using the standard protocol without intravenous contrast. Multidetector CT images of the lumbar were obtained using the standard protocol without intravenous contrast. CONTRAST:  138mL OMNIPAQUE IOHEXOL 350 MG/ML SOLN  COMPARISON:  CT chest, abdomen, pelvis 09/08/2020. FINDINGS: CTA CHEST FINDINGS Cardiovascular: Left chest wall Port-A-Cath with tip terminating in the region of the superior cavoatrial junction. Satisfactory opacification of the pulmonary arteries to the segmental level. Stable tiny nonocclusive central and left lower lobe segmental pulmonary embolus (124, 3:127). No new pulmonary embolus. Normal heart size. No pericardial effusion. At least mild left anterior descending coronary artery calcifications. Mediastinum/Nodes: Interval increase in size 1.6 cm (from 1.2 cm) right hilar lymph node. No left hilar lymph node. Stable borderline enlarged 1.1 cm prevascular lymph node. (3:103). No axillary lymphadenopathy. Lungs/Pleura: Interval increase in size of a lobulated right upper lobe 2.7 x 3.7 X 3.3cm (from 2.4 x 3.6 x 2.8 cm) pulmonary mass. Interval increase in conspicuity and increase in size of a nodule like right lower lobe density measuring up to 2.6 cm (from 1.5 cm) (4:85). Interval decrease in size of a trace right pleural effusion. No left pleural effusion. No pneumothorax. Musculoskeletal: Redemonstration of a densely sclerotic left T12 posterior rib lesion (11:77). Redemonstration of a punctate densely sclerotic lesion within the left anterolateral third and posterolateral 7th ribs. No acute displaced rib fracture. No acute displaced sternal fracture. Please see below regarding spine. Review of the MIP images confirms the above findings. CT ABDOMEN and PELVIS FINDINGS Hepatobiliary: Interval increase in size of a right hepatic lobe 4.1 x 2.7 cm (from 3 x 2.1 cm) hypodense lesion (1:28). Interval increase in size and conspicuity of a right inferior hepatic lobe 4.3 x 2.1 (from 2.3 x 1 cm) lesion (1:39). Some areas of the liver appears slightly heterogeneous (1:19) with no definite lesion. No definite evidence of a new hepatic lesion on this single phase contrast study. No gallstones, gallbladder wall  thickening, or pericholecystic fluid. No biliary dilatation. Pancreas: No focal lesion. Normal pancreatic contour. No surrounding inflammatory changes. No main pancreatic ductal dilatation. Spleen: Normal in size without focal abnormality. Adrenals/Urinary Tract: No adrenal nodule bilaterally. Bilateral kidneys enhance symmetrically. No nephroureterolithiasis. No hydronephrosis. No hydroureter. On delayed imaging, there is no urothelial wall thickening and there are no filling defects in the opacified portions of the bilateral collecting systems or ureters. The urinary bladder is unremarkable. Stomach/Bowel: Stomach is within normal limits. No evidence of bowel wall thickening or dilatation. Stool throughout the colon. Appendix appears normal. Vascular/Lymphatic: No abdominal aorta or iliac aneurysm. Mild to moderate calcified and noncalcified atherosclerotic plaque  of the aorta and its branches. Stable prominent retrocrural lymph nodes measuring up to 8 mm (1:23). Stable prominent retroperitoneal lymph nodes. Stable to slightly decreased in size enlarged right external iliac lymph node open (1:69). Now measuring 1.2 cm (from 1.5 cm). No abdominal or inguinal lymphadenopathy. Reproductive: Prostate is unremarkable. Other: No intraperitoneal free fluid. No intraperitoneal free gas. No organized fluid collection. Musculoskeletal: No acute displaced pelvic fracture. Partially visualized left proximal femur intramedullary nail fixation. No acute displaced fracture of the sacrum. Interval increase in size of a right iliac wing lytic lesion with cortical break (1:68). Redemonstration of a left iliac wing lytic lesion that extends to the superior/fall sacroiliac joint (5:92). Redemonstration of a lytic left sacral lesion (1:68, 5:103). CT THORACIC SPINE: Alignment: Twelve rib-bearing thoracic vertebral bodies are noted. Normal. Vertebrae: Multilevel mild-to-moderate degenerative changes of the spine. Slightly more  conspicuous lytic lesion and cortical destruction of the right T11 vertebral body, lamina, and superior facet (11:58, 8:118). Interval development of superior endplate T11 concavity and vague vertebral body height loss. No acute displaced fracture. Paraspinal and other soft tissues: Unremarkable. Disc levels: Maintained. CT LUMBAR SPINE: Alignment: 5 non-rib-bearing lumbar vertebral bodies are noted. Normal. Vertebrae: No acute displaced fracture. No suspicious lytic or blastic osseous lesions. Paraspinal and other soft tissues: Unremarkable Disc levels: Maintained. Review of the MIP images confirms the above findings. IMPRESSION: 1. Interval development of a pathologic compression fracture with less than 5% height loss of the T11 vertebral body with persistent right vertebral body lytic lesion that extends to the posterior elements. 2. Stable tiny nonocclusive central and left lower lobe segmental pulmonary embolus. 3. Interval increase in size of a right upper lobe pulmonary mass and right lower lobe nodular-like lesion. Associated stable to slightly increased right hilar and mediastinal lymph nodes. 4. Interval increase in size of hepatic metastatic lesions. 5. Some stable and some increased in size axial and appendicular skeleton lytic metastatic lesion. 6. Slight interval decrease in trace right pleural effusion. 7.  Aortic Atherosclerosis (ICD10-I70.0). Electronically Signed   By: Iven Finn M.D.   On: 10/10/2020 19:00   CT L-SPINE NO CHARGE  Result Date: 10/10/2020 CLINICAL DATA:  Bilateral flank pain.  Stage IV metastatic disease. EXAM: CT ANGIOGRAPHY CHEST CT ABDOMEN AND PELVIS WITH CONTRAST CT THORACIC SPINE CT LUMBAR SPINE TECHNIQUE: Multidetector CT imaging of the chest was performed using the standard protocol during bolus administration of intravenous contrast. Multiplanar CT image reconstructions and MIPs were obtained to evaluate the vascular anatomy. Multidetector CT imaging of the abdomen  and pelvis was performed using the standard protocol during bolus administration of intravenous contrast. Multidetector CT images of the thoracic were obtained using the standard protocol without intravenous contrast. Multidetector CT images of the lumbar were obtained using the standard protocol without intravenous contrast. CONTRAST:  170mL OMNIPAQUE IOHEXOL 350 MG/ML SOLN COMPARISON:  CT chest, abdomen, pelvis 09/08/2020. FINDINGS: CTA CHEST FINDINGS Cardiovascular: Left chest wall Port-A-Cath with tip terminating in the region of the superior cavoatrial junction. Satisfactory opacification of the pulmonary arteries to the segmental level. Stable tiny nonocclusive central and left lower lobe segmental pulmonary embolus (124, 3:127). No new pulmonary embolus. Normal heart size. No pericardial effusion. At least mild left anterior descending coronary artery calcifications. Mediastinum/Nodes: Interval increase in size 1.6 cm (from 1.2 cm) right hilar lymph node. No left hilar lymph node. Stable borderline enlarged 1.1 cm prevascular lymph node. (3:103). No axillary lymphadenopathy. Lungs/Pleura: Interval increase in size of a lobulated right upper lobe  2.7 x 3.7 X 3.3cm (from 2.4 x 3.6 x 2.8 cm) pulmonary mass. Interval increase in conspicuity and increase in size of a nodule like right lower lobe density measuring up to 2.6 cm (from 1.5 cm) (4:85). Interval decrease in size of a trace right pleural effusion. No left pleural effusion. No pneumothorax. Musculoskeletal: Redemonstration of a densely sclerotic left T12 posterior rib lesion (11:77). Redemonstration of a punctate densely sclerotic lesion within the left anterolateral third and posterolateral 7th ribs. No acute displaced rib fracture. No acute displaced sternal fracture. Please see below regarding spine. Review of the MIP images confirms the above findings. CT ABDOMEN and PELVIS FINDINGS Hepatobiliary: Interval increase in size of a right hepatic lobe 4.1  x 2.7 cm (from 3 x 2.1 cm) hypodense lesion (1:28). Interval increase in size and conspicuity of a right inferior hepatic lobe 4.3 x 2.1 (from 2.3 x 1 cm) lesion (1:39). Some areas of the liver appears slightly heterogeneous (1:19) with no definite lesion. No definite evidence of a new hepatic lesion on this single phase contrast study. No gallstones, gallbladder wall thickening, or pericholecystic fluid. No biliary dilatation. Pancreas: No focal lesion. Normal pancreatic contour. No surrounding inflammatory changes. No main pancreatic ductal dilatation. Spleen: Normal in size without focal abnormality. Adrenals/Urinary Tract: No adrenal nodule bilaterally. Bilateral kidneys enhance symmetrically. No nephroureterolithiasis. No hydronephrosis. No hydroureter. On delayed imaging, there is no urothelial wall thickening and there are no filling defects in the opacified portions of the bilateral collecting systems or ureters. The urinary bladder is unremarkable. Stomach/Bowel: Stomach is within normal limits. No evidence of bowel wall thickening or dilatation. Stool throughout the colon. Appendix appears normal. Vascular/Lymphatic: No abdominal aorta or iliac aneurysm. Mild to moderate calcified and noncalcified atherosclerotic plaque of the aorta and its branches. Stable prominent retrocrural lymph nodes measuring up to 8 mm (1:23). Stable prominent retroperitoneal lymph nodes. Stable to slightly decreased in size enlarged right external iliac lymph node open (1:69). Now measuring 1.2 cm (from 1.5 cm). No abdominal or inguinal lymphadenopathy. Reproductive: Prostate is unremarkable. Other: No intraperitoneal free fluid. No intraperitoneal free gas. No organized fluid collection. Musculoskeletal: No acute displaced pelvic fracture. Partially visualized left proximal femur intramedullary nail fixation. No acute displaced fracture of the sacrum. Interval increase in size of a right iliac wing lytic lesion with cortical  break (1:68). Redemonstration of a left iliac wing lytic lesion that extends to the superior/fall sacroiliac joint (5:92). Redemonstration of a lytic left sacral lesion (1:68, 5:103). CT THORACIC SPINE: Alignment: Twelve rib-bearing thoracic vertebral bodies are noted. Normal. Vertebrae: Multilevel mild-to-moderate degenerative changes of the spine. Slightly more conspicuous lytic lesion and cortical destruction of the right T11 vertebral body, lamina, and superior facet (11:58, 8:118). Interval development of superior endplate T11 concavity and vague vertebral body height loss. No acute displaced fracture. Paraspinal and other soft tissues: Unremarkable. Disc levels: Maintained. CT LUMBAR SPINE: Alignment: 5 non-rib-bearing lumbar vertebral bodies are noted. Normal. Vertebrae: No acute displaced fracture. No suspicious lytic or blastic osseous lesions. Paraspinal and other soft tissues: Unremarkable Disc levels: Maintained. Review of the MIP images confirms the above findings. IMPRESSION: 1. Interval development of a pathologic compression fracture with less than 5% height loss of the T11 vertebral body with persistent right vertebral body lytic lesion that extends to the posterior elements. 2. Stable tiny nonocclusive central and left lower lobe segmental pulmonary embolus. 3. Interval increase in size of a right upper lobe pulmonary mass and right lower lobe nodular-like lesion. Associated  stable to slightly increased right hilar and mediastinal lymph nodes. 4. Interval increase in size of hepatic metastatic lesions. 5. Some stable and some increased in size axial and appendicular skeleton lytic metastatic lesion. 6. Slight interval decrease in trace right pleural effusion. 7.  Aortic Atherosclerosis (ICD10-I70.0). Electronically Signed   By: Iven Finn M.D.   On: 10/10/2020 19:00   DG Shoulder Left  Result Date: 10/02/2020 CLINICAL DATA:  Shoulder pain EXAM: LEFT SHOULDER - 2+ VIEW COMPARISON:  None.  FINDINGS: Partially visualized left-sided central venous port. No fracture or malalignment. No suspicious bone lesion IMPRESSION: Negative. Electronically Signed   By: Donavan Foil M.D.   On: 10/02/2020 17:00

## 2020-10-20 ENCOUNTER — Encounter: Payer: 59 | Admitting: Medical

## 2020-10-20 DIAGNOSIS — C3491 Malignant neoplasm of unspecified part of right bronchus or lung: Secondary | ICD-10-CM

## 2020-10-20 DIAGNOSIS — C7951 Secondary malignant neoplasm of bone: Secondary | ICD-10-CM

## 2020-10-20 DIAGNOSIS — M25512 Pain in left shoulder: Secondary | ICD-10-CM

## 2020-10-21 ENCOUNTER — Inpatient Hospital Stay: Payer: 59 | Attending: Physician Assistant | Admitting: Medical

## 2020-10-21 ENCOUNTER — Ambulatory Visit: Payer: 59 | Admitting: Internal Medicine

## 2020-10-21 ENCOUNTER — Other Ambulatory Visit: Payer: Self-pay

## 2020-10-21 ENCOUNTER — Ambulatory Visit: Payer: 59

## 2020-10-21 ENCOUNTER — Other Ambulatory Visit: Payer: 59

## 2020-10-21 VITALS — BP 118/66 | HR 100 | Temp 97.7°F | Resp 20 | Ht 71.0 in | Wt 246.6 lb

## 2020-10-21 DIAGNOSIS — C7951 Secondary malignant neoplasm of bone: Secondary | ICD-10-CM | POA: Insufficient documentation

## 2020-10-21 DIAGNOSIS — Z5111 Encounter for antineoplastic chemotherapy: Secondary | ICD-10-CM | POA: Diagnosis not present

## 2020-10-21 DIAGNOSIS — G4733 Obstructive sleep apnea (adult) (pediatric): Secondary | ICD-10-CM | POA: Insufficient documentation

## 2020-10-21 DIAGNOSIS — K219 Gastro-esophageal reflux disease without esophagitis: Secondary | ICD-10-CM | POA: Diagnosis not present

## 2020-10-21 DIAGNOSIS — K589 Irritable bowel syndrome without diarrhea: Secondary | ICD-10-CM | POA: Insufficient documentation

## 2020-10-21 DIAGNOSIS — K760 Fatty (change of) liver, not elsewhere classified: Secondary | ICD-10-CM | POA: Diagnosis not present

## 2020-10-21 DIAGNOSIS — Z5112 Encounter for antineoplastic immunotherapy: Secondary | ICD-10-CM | POA: Insufficient documentation

## 2020-10-21 DIAGNOSIS — Z923 Personal history of irradiation: Secondary | ICD-10-CM | POA: Diagnosis not present

## 2020-10-21 DIAGNOSIS — E119 Type 2 diabetes mellitus without complications: Secondary | ICD-10-CM | POA: Diagnosis not present

## 2020-10-21 DIAGNOSIS — E7800 Pure hypercholesterolemia, unspecified: Secondary | ICD-10-CM | POA: Diagnosis not present

## 2020-10-21 DIAGNOSIS — G8929 Other chronic pain: Secondary | ICD-10-CM | POA: Diagnosis not present

## 2020-10-21 DIAGNOSIS — C3411 Malignant neoplasm of upper lobe, right bronchus or lung: Secondary | ICD-10-CM | POA: Diagnosis not present

## 2020-10-21 DIAGNOSIS — M25512 Pain in left shoulder: Secondary | ICD-10-CM | POA: Diagnosis not present

## 2020-10-21 DIAGNOSIS — Z7984 Long term (current) use of oral hypoglycemic drugs: Secondary | ICD-10-CM | POA: Insufficient documentation

## 2020-10-21 DIAGNOSIS — F17210 Nicotine dependence, cigarettes, uncomplicated: Secondary | ICD-10-CM | POA: Insufficient documentation

## 2020-10-21 DIAGNOSIS — Z7901 Long term (current) use of anticoagulants: Secondary | ICD-10-CM | POA: Insufficient documentation

## 2020-10-21 DIAGNOSIS — Z79899 Other long term (current) drug therapy: Secondary | ICD-10-CM | POA: Diagnosis not present

## 2020-10-21 MED ORDER — OXYCODONE HCL 5 MG PO TABS: ORAL_TABLET | ORAL | 0 refills | Status: DC

## 2020-10-22 NOTE — Progress Notes (Signed)
[facility] OFFICE PROGRESS NOTE  [name], MD [place] [address]  DIAGNOSIS: Stage IV (T2b, N3, M1C) non-small cell lung cancer, squamous cell carcinoma presented with right upper lobe lung mass in addition to mediastinal and right supraclavicular lymphadenopathy as well as multiple metastatic bone lesions diagnosed in March 2021.  PRIOR THERAPY: 1)Palliative radiotherapy to the painful metastatic bone lesions under the care of Dr. Sondra Come. Last dose 01/14/20 2)Palliative systemic chemotherapy withcarboplatin for AUC of 5, paclitaxel 175 NG/M2 and Keytruda 200 mg IV every 3 weeks with Neulasta support.Last dose on11/23/21. Status post9cycles.Starting from cycle #5 he will be on maintenance single agent Keytruda  CURRENT THERAPY: Palliative systemic chemotherapy with docetaxel 75 mg per metered squared and Cyramza 10 mg/kg IV every 3 weeks with Neulasta support. First dose expected on 10/03/20.Status post 1 cycle  INTERVAL HISTORY: Mike [name] 55 y.o. male returns to the clinic today for a follow up visit. The patient unfortunately recently had evidence for disease progression with with thoracic adenopathy, right upper lobe mass, and widespread scattered lytic osseous lesions and progressive hepatic metastatic disease. His treatment was subsequently changed to docetaxel and Cyramza. He is status post 1 cycle and tolerated it fair. For pain control, he is currently on 75 mcg patches of fentanyl and he takes 1-2 tablets of 5 mg oxycodone every 6 hours. He primarily localizes his pain to in the left shoulder. He rates his pain a 10/10 at its worst and a 5/10 with the pain medication regimen. He does have widespread osseous metastases but no clear etiology in the left shoulder. He saw Dr. Sondra Come from radiation oncology but no lesions were clear to target for palliative radiotherapy causing his pain. He has had xrays performed and CT's as well.  He saw his orthopedic physician recently regarding this and was given a medrol dosepak and robaxin. He is also supposed to see another orthopedic physician soon for a joint injection. The patient as seen in the ER on 10/10/20 for bilateral flank pain. He had imaging performed which showed an interval development of a pathologic compression fracture at T11. There was some evidence of possible disease progression, although the patient had only had one cycle of his new treatment.  His back pain has improved at this time.  Otherwise, the patient denies any recent fever, chills, night sweats, or weight loss.   At his last appointment he was complaining of dry mouth and mouth sores which have improved.  He has a prescription for Magic mouthwash.  At his last appointment he also was endorsing bilateral lower extremity pitting edema , likely secondary to his very low albumin.  He drinks boost 1 time per day.  He has not used his compression stockings yet.  States his breathing and shortness of breath is "not bad at all".  He reports occasional right-sided chest discomfort with coughing.  When he has a cough, the patient states that he produces clear sputum.  Of note, the patient continues to smoke approximately 4 to 5 cigarettes/day.  He denies any hemoptysis.  He denies nausea, vomiting, diarrhea, or constipation.  He takes Colace 2 times per day to prevent constipation.  At his last appointment he was endorsing a headache which improved and he has not had any recurrent headaches or visual changes.  Per chart review, there is a note inquiring about palliative and hospice care.  When discussed with the patient today he said that he is "not there  yet".  He is here for evaluation before starting cycle #2.    MEDICAL HISTORY: Past Medical History:  Diagnosis Date   Arthritis    neck   Cancer, metastatic to bone ([place]) dx'd 10/2019   Diabetes ([place])    Fatty liver 12/05/2019   By [place] 11/2019   GERD  (gastroesophageal reflux disease)    Heart murmur    "heart skip" since his 20's   High cholesterol    IBS (irritable bowel syndrome)    lung ca dx'd 10/2019   lung stage 4    OSA (obstructive sleep apnea) 09/15/2019   Sleep study 12/2017 - severe OSA with AHI 65.6, desat to 76% rec CPAP   Severe obesity (BMI 35.0-39.9) with comorbidity ([place]) 09/04/2019    ALLERGIES:  has No Known Allergies.  MEDICATIONS:  Current Outpatient Medications  Medication Sig Dispense Refill   albuterol (PROVENTIL) (2.5 MG/3ML) 0.083% nebulizer solution Take 3 mLs (2.5 mg total) by nebulization every 6 (six) hours as needed for wheezing or shortness of breath. 75 mL 12   atorvastatin (LIPITOR) 20 MG tablet Take 1 tablet (20 mg total) by mouth at bedtime. 90 tablet 3   dexamethasone (DECADRON) 4 MG tablet Take 2 tablets TWICE a day the day before, the day of, and the day after chemotherapy. 60 tablet 2   diazepam (VALIUM) 5 MG tablet Take 1 tablet (5 mg total) by mouth every 12 (twelve) hours as needed for anxiety or muscle spasms. 30 tablet 0   docusate sodium (COLACE) 100 MG capsule Take 1 capsule (100 mg total) by mouth 2 (two) times daily. 10 capsule 0   ELIQUIS 5 MG TABS tablet TAKE 1 TABLET BY MOUTH TWICE A DAY 60 tablet 3   fentaNYL (DURAGESIC) 100 MCG/HR Place 1 patch onto the skin every 3 (three) days. 5 patch 0   furosemide (LASIX) 20 MG tablet Take 1 tablet (20 mg total) by mouth daily. 5 tablet 0   magic mouthwash SOLN Take 5 mLs by mouth 4 (four) times daily. 240 mL 0   metFORMIN (GLUCOPHAGE) 1000 MG tablet Take 1 tablet (1,000 mg total) by mouth 2 (two) times daily with a meal. 180 tablet 0   methocarbamol (ROBAXIN) 500 MG tablet Take 1 tablet (500 mg total) by mouth every 6 (six) hours as needed for muscle spasms. 60 tablet 6   oxyCODONE (OXY IR/ROXICODONE) 5 MG immediate release tablet 1 to 2 PO Q 4 hours prn pain 60 tablet 0   sildenafil (REVATIO) 20 MG tablet Take 1-5 tablets  (20-100 mg total) by mouth daily as needed (ED). (Patient taking differently: Take 40 mg by mouth daily as needed (ED).) 30 tablet 3   No current facility-administered medications for this visit.   Facility-Administered Medications Ordered in Other Visits  Medication Dose Route Frequency Provider Last Rate Last Admin   dexamethasone (DECADRON) 10 mg in sodium chloride 0.9 % 50 mL IVPB  10 mg Intravenous Once [name], MD 204 mL/hr at 10/23/20 1254 10 mg at 10/23/20 1254   DOCEtaxel (TAXOTERE) 180 mg in sodium chloride 0.9 % 250 mL chemo infusion  75 mg/m2 (Treatment Plan Recorded) Intravenous Once [name], MD       heparin lock flush 100 unit/mL  500 Units Intracatheter Once PRN [name], MD       ramucirumab [facility]) 1,100 mg in sodium chloride 0.9 % 140 mL chemo infusion  10 mg/kg (Treatment Plan Recorded) Intravenous Once [name], MD  sodium chloride flush (NS) 0.9 % injection 10 mL  10 mL Intracatheter PRN [name], MD        SURGICAL HISTORY:  Past Surgical History:  Procedure Laterality Date   BIOPSY  12/18/2019   Procedure: BIOPSY;  Surgeon: Garner Nash, DO;  Location: [facility];  Service: Pulmonary;;   BRONCHIAL BRUSHINGS  12/18/2019   Procedure: BRONCHIAL BRUSHINGS;  Surgeon: Garner Nash, DO;  Location: [facility];  Service: Pulmonary;;   BRONCHIAL WASHINGS  12/18/2019   Procedure: BRONCHIAL WASHINGS;  Surgeon: Garner Nash, DO;  Location: [facility];  Service: Pulmonary;;   COLONOSCOPY  10/2019   mult TA, int hem, rpt 1 yr ([place])   CYSTECTOMY     knee- left knee cyst   ENDOBRONCHIAL ULTRASOUND  12/18/2019   Procedure: ENDOBRONCHIAL ULTRASOUND;  Surgeon: Garner Nash, DO;  Location: [facility];  Service: Pulmonary;;   FINE NEEDLE ASPIRATION  12/18/2019   Procedure: FINE NEEDLE ASPIRATION;  Surgeon: Garner Nash, DO;  Location: [facility];  Service: Pulmonary;;   INTRAMEDULLARY (IM) NAIL  INTERTROCHANTERIC Left 07/25/2020   Procedure: LEFT INTERTROCHANTERIC INTRAMEDULLARY (IM) NAIL;  Surgeon: Leandrew Koyanagi, MD;  Location: [facility];  Service: Orthopedics;  Laterality: Left;   IR IMAGING GUIDED PORT INSERTION  12/27/2019   PILONIDAL CYST EXCISION     VIDEO BRONCHOSCOPY WITH ENDOBRONCHIAL ULTRASOUND Right 12/18/2019   Procedure: VIDEO BRONCHOSCOPY;  Surgeon: Garner Nash, DO;  Location: [facility];  Service: Pulmonary;  Laterality: Right;    REVIEW OF SYSTEMS:   Review of Systems  Constitutional:Positive for decreased appetiteand fatigue. Negative for chills and fever. HENT:  Negative for mouth nosebleeds, mouth sores, sore throat and trouble swallowing.  Eyes: Negative for eye problems and icterus.  Respiratory:Positive for stable shortness of breath and cough.Negative for hemoptysis and wheezing.  Cardiovascular:Positive for bilateral lower extremity pitting edema.  Right-sided chest pain with coughing occasionally.   Gastrointestinal: Negative for abdominal pain, diarrhea, constipation, nausea and vomiting.  Genitourinary: Negative for bladder incontinence, difficulty urinating, dysuria, frequency and hematuria.  Musculoskeletal:Positive for bone pain particularly in hip, back, left neck/left shoulder. Negative for neck stiffness.  Skin: Negative for itching and rash.  Neurological: Negative for dizziness, headache, extremity weakness, gait problem, light-headedness and seizures.  Hematological: Negative for adenopathy. Does not bruise/bleed easily.  Psychiatric/Behavioral: Negative for confusion, depression and sleep disturbance. The patient is not nervous/anxious.    PHYSICAL EXAMINATION:  Blood pressure 120/79, pulse (!) 106, temperature 97.8 F (36.6 C), temperature source Tympanic, resp. rate 16, height 5\' 11"  (1.803 m), SpO2 98 %.  ECOG PERFORMANCE STATUS: 1-2  Physical Exam  Constitutional: Oriented to person, place, and time and well-developed,  well-nourished, and in no distress.  HENT:  Head: Normocephalic and atraumatic. Eyes: Conjunctivae are normal. Right eye exhibits no discharge. Left eye exhibits no discharge. No scleral icterus.  Neck: Normal range of motion. Neck supple.  Cardiovascular: Normal rate, regular rhythm, normal heart sounds and intact distal pulses.  Pulmonary/Chest: Effort normal. Some wheezing bilaterally. No respiratory distress. No rales.  Abdominal: Soft. Bowel sounds are normal. Exhibits no distension and no mass. There is no tenderness.  Musculoskeletal: Lower extremity pitting edema bilateral.Normal range of motion.  Lymphadenopathy:  No cervical adenopathy.  Neurological: Alert and oriented to person, place, and time. Exhibits normal muscle tone. The patient was examined in the wheelchair. Skin: Skin is warm and dry. No rash noted. Not diaphoretic. No erythema. No pallor.  Psychiatric: Mood, memory and judgment  normal.  Vitals reviewed.  LABORATORY DATA: Lab Results  Component Value Date   WBC 15.2 (H) 10/23/2020   HGB 9.8 (L) 10/23/2020   HCT 31.1 (L) 10/23/2020   MCV 89.1 10/23/2020   PLT 286 10/23/2020      Chemistry      Component Value Date/Time   NA 137 10/23/2020 1116   K 4.3 10/23/2020 1116   CL 101 10/23/2020 1116   CO2 27 10/23/2020 1116   BUN 6 10/23/2020 1116   CREATININE 0.59 (L) 10/23/2020 1116      Component Value Date/Time   CALCIUM 9.4 10/23/2020 1116   ALKPHOS 136 (H) 10/23/2020 1116   AST 16 10/23/2020 1116   ALT 16 10/23/2020 1116   BILITOT 0.4 10/23/2020 1116       RADIOGRAPHIC STUDIES:  DG Cervical Spine 2-3 Views  Result Date: 10/02/2020 CLINICAL DATA:  Left-sided neck pain history of lung cancer EXAM: CERVICAL SPINE - 2-3 VIEW COMPARISON:  MRI 09/02/2014 FINDINGS: Mild reversal of cervical lordosis. Vertebral body heights are normal. Mild to moderate degenerative changes C4-C5, C5-C6 and C6-C7. The dens and lateral masses are within normal  limits IMPRESSION: Mild to moderate degenerative changes, most advanced C4 through C7. No acute osseous abnormality. Electronically Signed   By: Donavan Foil M.D.   On: 10/02/2020 17:00   DG Clavicle Left  Result Date: 10/02/2020 CLINICAL DATA:  Left clavicle pain EXAM: LEFT CLAVICLE - 2+ VIEWS COMPARISON:  None. FINDINGS: No fracture or malalignment.  Mild AC joint degenerative change. IMPRESSION: No acute osseous abnormality. Electronically Signed   By: Donavan Foil M.D.   On: 10/02/2020 17:01   CT Angio Chest PE W and/or Wo Contrast  Result Date: 10/10/2020 CLINICAL DATA:  Bilateral flank pain.  Stage IV metastatic disease. EXAM: CT ANGIOGRAPHY CHEST CT ABDOMEN AND PELVIS WITH CONTRAST CT THORACIC SPINE CT LUMBAR SPINE TECHNIQUE: Multidetector CT imaging of the chest was performed using the standard protocol during bolus administration of intravenous contrast. Multiplanar CT image reconstructions and MIPs were obtained to evaluate the vascular anatomy. Multidetector CT imaging of the abdomen and pelvis was performed using the standard protocol during bolus administration of intravenous contrast. Multidetector CT images of the thoracic were obtained using the standard protocol without intravenous contrast. Multidetector CT images of the lumbar were obtained using the standard protocol without intravenous contrast. CONTRAST:  130mL OMNIPAQUE IOHEXOL 350 MG/ML SOLN COMPARISON:  CT chest, abdomen, pelvis 09/08/2020. FINDINGS: CTA CHEST FINDINGS Cardiovascular: Left chest wall Port-A-Cath with tip terminating in the region of the superior cavoatrial junction. Satisfactory opacification of the pulmonary arteries to the segmental level. Stable tiny nonocclusive central and left lower lobe segmental pulmonary embolus (124, 3:127). No new pulmonary embolus. Normal heart size. No pericardial effusion. At least mild left anterior descending coronary artery calcifications. Mediastinum/Nodes: Interval increase in  size 1.6 cm (from 1.2 cm) right hilar lymph node. No left hilar lymph node. Stable borderline enlarged 1.1 cm prevascular lymph node. (3:103). No axillary lymphadenopathy. Lungs/Pleura: Interval increase in size of a lobulated right upper lobe 2.7 x 3.7 X 3.3cm (from 2.4 x 3.6 x 2.8 cm) pulmonary mass. Interval increase in conspicuity and increase in size of a nodule like right lower lobe density measuring up to 2.6 cm (from 1.5 cm) (4:85). Interval decrease in size of a trace right pleural effusion. No left pleural effusion. No pneumothorax. Musculoskeletal: Redemonstration of a densely sclerotic left T12 posterior rib lesion (11:77). Redemonstration of a punctate densely sclerotic lesion within  the left anterolateral third and posterolateral 7th ribs. No acute displaced rib fracture. No acute displaced sternal fracture. Please see below regarding spine. Review of the MIP images confirms the above findings. CT ABDOMEN and PELVIS FINDINGS Hepatobiliary: Interval increase in size of a right hepatic lobe 4.1 x 2.7 cm (from 3 x 2.1 cm) hypodense lesion (1:28). Interval increase in size and conspicuity of a right inferior hepatic lobe 4.3 x 2.1 (from 2.3 x 1 cm) lesion (1:39). Some areas of the liver appears slightly heterogeneous (1:19) with no definite lesion. No definite evidence of a new hepatic lesion on this single phase contrast study. No gallstones, gallbladder wall thickening, or pericholecystic fluid. No biliary dilatation. Pancreas: No focal lesion. Normal pancreatic contour. No surrounding inflammatory changes. No main pancreatic ductal dilatation. Spleen: Normal in size without focal abnormality. Adrenals/Urinary Tract: No adrenal nodule bilaterally. Bilateral kidneys enhance symmetrically. No nephroureterolithiasis. No hydronephrosis. No hydroureter. On delayed imaging, there is no urothelial wall thickening and there are no filling defects in the opacified portions of the bilateral collecting systems or  ureters. The urinary bladder is unremarkable. Stomach/Bowel: Stomach is within normal limits. No evidence of bowel wall thickening or dilatation. Stool throughout the colon. Appendix appears normal. Vascular/Lymphatic: No abdominal aorta or iliac aneurysm. Mild to moderate calcified and noncalcified atherosclerotic plaque of the aorta and its branches. Stable prominent retrocrural lymph nodes measuring up to 8 mm (1:23). Stable prominent retroperitoneal lymph nodes. Stable to slightly decreased in size enlarged right external iliac lymph node open (1:69). Now measuring 1.2 cm (from 1.5 cm). No abdominal or inguinal lymphadenopathy. Reproductive: Prostate is unremarkable. Other: No intraperitoneal free fluid. No intraperitoneal free gas. No organized fluid collection. Musculoskeletal: No acute displaced pelvic fracture. Partially visualized left proximal femur intramedullary nail fixation. No acute displaced fracture of the sacrum. Interval increase in size of a right iliac wing lytic lesion with cortical break (1:68). Redemonstration of a left iliac wing lytic lesion that extends to the superior/fall sacroiliac joint (5:92). Redemonstration of a lytic left sacral lesion (1:68, 5:103). CT THORACIC SPINE: Alignment: Twelve rib-bearing thoracic vertebral bodies are noted. Normal. Vertebrae: Multilevel mild-to-moderate degenerative changes of the spine. Slightly more conspicuous lytic lesion and cortical destruction of the right T11 vertebral body, lamina, and superior facet (11:58, 8:118). Interval development of superior endplate T11 concavity and vague vertebral body height loss. No acute displaced fracture. Paraspinal and other soft tissues: Unremarkable. Disc levels: Maintained. CT LUMBAR SPINE: Alignment: 5 non-rib-bearing lumbar vertebral bodies are noted. Normal. Vertebrae: No acute displaced fracture. No suspicious lytic or blastic osseous lesions. Paraspinal and other soft tissues: Unremarkable Disc levels:  Maintained. Review of the MIP images confirms the above findings. IMPRESSION: 1. Interval development of a pathologic compression fracture with less than 5% height loss of the T11 vertebral body with persistent right vertebral body lytic lesion that extends to the posterior elements. 2. Stable tiny nonocclusive central and left lower lobe segmental pulmonary embolus. 3. Interval increase in size of a right upper lobe pulmonary mass and right lower lobe nodular-like lesion. Associated stable to slightly increased right hilar and mediastinal lymph nodes. 4. Interval increase in size of hepatic metastatic lesions. 5. Some stable and some increased in size axial and appendicular skeleton lytic metastatic lesion. 6. Slight interval decrease in trace right pleural effusion. 7.  Aortic Atherosclerosis (ICD10-I70.0). Electronically Signed   By: Iven Finn M.D.   On: 10/10/2020 19:00   CT ABDOMEN PELVIS W CONTRAST  Result Date: 10/10/2020 CLINICAL DATA:  Bilateral flank pain.  Stage IV metastatic disease. EXAM: CT ANGIOGRAPHY CHEST CT ABDOMEN AND PELVIS WITH CONTRAST CT THORACIC SPINE CT LUMBAR SPINE TECHNIQUE: Multidetector CT imaging of the chest was performed using the standard protocol during bolus administration of intravenous contrast. Multiplanar CT image reconstructions and MIPs were obtained to evaluate the vascular anatomy. Multidetector CT imaging of the abdomen and pelvis was performed using the standard protocol during bolus administration of intravenous contrast. Multidetector CT images of the thoracic were obtained using the standard protocol without intravenous contrast. Multidetector CT images of the lumbar were obtained using the standard protocol without intravenous contrast. CONTRAST:  128mL OMNIPAQUE IOHEXOL 350 MG/ML SOLN COMPARISON:  CT chest, abdomen, pelvis 09/08/2020. FINDINGS: CTA CHEST FINDINGS Cardiovascular: Left chest wall Port-A-Cath with tip terminating in the region of the superior  cavoatrial junction. Satisfactory opacification of the pulmonary arteries to the segmental level. Stable tiny nonocclusive central and left lower lobe segmental pulmonary embolus (124, 3:127). No new pulmonary embolus. Normal heart size. No pericardial effusion. At least mild left anterior descending coronary artery calcifications. Mediastinum/Nodes: Interval increase in size 1.6 cm (from 1.2 cm) right hilar lymph node. No left hilar lymph node. Stable borderline enlarged 1.1 cm prevascular lymph node. (3:103). No axillary lymphadenopathy. Lungs/Pleura: Interval increase in size of a lobulated right upper lobe 2.7 x 3.7 X 3.3cm (from 2.4 x 3.6 x 2.8 cm) pulmonary mass. Interval increase in conspicuity and increase in size of a nodule like right lower lobe density measuring up to 2.6 cm (from 1.5 cm) (4:85). Interval decrease in size of a trace right pleural effusion. No left pleural effusion. No pneumothorax. Musculoskeletal: Redemonstration of a densely sclerotic left T12 posterior rib lesion (11:77). Redemonstration of a punctate densely sclerotic lesion within the left anterolateral third and posterolateral 7th ribs. No acute displaced rib fracture. No acute displaced sternal fracture. Please see below regarding spine. Review of the MIP images confirms the above findings. CT ABDOMEN and PELVIS FINDINGS Hepatobiliary: Interval increase in size of a right hepatic lobe 4.1 x 2.7 cm (from 3 x 2.1 cm) hypodense lesion (1:28). Interval increase in size and conspicuity of a right inferior hepatic lobe 4.3 x 2.1 (from 2.3 x 1 cm) lesion (1:39). Some areas of the liver appears slightly heterogeneous (1:19) with no definite lesion. No definite evidence of a new hepatic lesion on this single phase contrast study. No gallstones, gallbladder wall thickening, or pericholecystic fluid. No biliary dilatation. Pancreas: No focal lesion. Normal pancreatic contour. No surrounding inflammatory changes. No main pancreatic ductal  dilatation. Spleen: Normal in size without focal abnormality. Adrenals/Urinary Tract: No adrenal nodule bilaterally. Bilateral kidneys enhance symmetrically. No nephroureterolithiasis. No hydronephrosis. No hydroureter. On delayed imaging, there is no urothelial wall thickening and there are no filling defects in the opacified portions of the bilateral collecting systems or ureters. The urinary bladder is unremarkable. Stomach/Bowel: Stomach is within normal limits. No evidence of bowel wall thickening or dilatation. Stool throughout the colon. Appendix appears normal. Vascular/Lymphatic: No abdominal aorta or iliac aneurysm. Mild to moderate calcified and noncalcified atherosclerotic plaque of the aorta and its branches. Stable prominent retrocrural lymph nodes measuring up to 8 mm (1:23). Stable prominent retroperitoneal lymph nodes. Stable to slightly decreased in size enlarged right external iliac lymph node open (1:69). Now measuring 1.2 cm (from 1.5 cm). No abdominal or inguinal lymphadenopathy. Reproductive: Prostate is unremarkable. Other: No intraperitoneal free fluid. No intraperitoneal free gas. No organized fluid collection. Musculoskeletal: No acute displaced pelvic fracture. Partially visualized  left proximal femur intramedullary nail fixation. No acute displaced fracture of the sacrum. Interval increase in size of a right iliac wing lytic lesion with cortical break (1:68). Redemonstration of a left iliac wing lytic lesion that extends to the superior/fall sacroiliac joint (5:92). Redemonstration of a lytic left sacral lesion (1:68, 5:103). CT THORACIC SPINE: Alignment: Twelve rib-bearing thoracic vertebral bodies are noted. Normal. Vertebrae: Multilevel mild-to-moderate degenerative changes of the spine. Slightly more conspicuous lytic lesion and cortical destruction of the right T11 vertebral body, lamina, and superior facet (11:58, 8:118). Interval development of superior endplate T11 concavity and  vague vertebral body height loss. No acute displaced fracture. Paraspinal and other soft tissues: Unremarkable. Disc levels: Maintained. CT LUMBAR SPINE: Alignment: 5 non-rib-bearing lumbar vertebral bodies are noted. Normal. Vertebrae: No acute displaced fracture. No suspicious lytic or blastic osseous lesions. Paraspinal and other soft tissues: Unremarkable Disc levels: Maintained. Review of the MIP images confirms the above findings. IMPRESSION: 1. Interval development of a pathologic compression fracture with less than 5% height loss of the T11 vertebral body with persistent right vertebral body lytic lesion that extends to the posterior elements. 2. Stable tiny nonocclusive central and left lower lobe segmental pulmonary embolus. 3. Interval increase in size of a right upper lobe pulmonary mass and right lower lobe nodular-like lesion. Associated stable to slightly increased right hilar and mediastinal lymph nodes. 4. Interval increase in size of hepatic metastatic lesions. 5. Some stable and some increased in size axial and appendicular skeleton lytic metastatic lesion. 6. Slight interval decrease in trace right pleural effusion. 7.  Aortic Atherosclerosis (ICD10-I70.0). Electronically Signed   By: Iven Finn M.D.   On: 10/10/2020 19:00   CT T-SPINE NO CHARGE  Result Date: 10/10/2020 CLINICAL DATA:  Bilateral flank pain.  Stage IV metastatic disease. EXAM: CT ANGIOGRAPHY CHEST CT ABDOMEN AND PELVIS WITH CONTRAST CT THORACIC SPINE CT LUMBAR SPINE TECHNIQUE: Multidetector CT imaging of the chest was performed using the standard protocol during bolus administration of intravenous contrast. Multiplanar CT image reconstructions and MIPs were obtained to evaluate the vascular anatomy. Multidetector CT imaging of the abdomen and pelvis was performed using the standard protocol during bolus administration of intravenous contrast. Multidetector CT images of the thoracic were obtained using the standard  protocol without intravenous contrast. Multidetector CT images of the lumbar were obtained using the standard protocol without intravenous contrast. CONTRAST:  127mL OMNIPAQUE IOHEXOL 350 MG/ML SOLN COMPARISON:  CT chest, abdomen, pelvis 09/08/2020. FINDINGS: CTA CHEST FINDINGS Cardiovascular: Left chest wall Port-A-Cath with tip terminating in the region of the superior cavoatrial junction. Satisfactory opacification of the pulmonary arteries to the segmental level. Stable tiny nonocclusive central and left lower lobe segmental pulmonary embolus (124, 3:127). No new pulmonary embolus. Normal heart size. No pericardial effusion. At least mild left anterior descending coronary artery calcifications. Mediastinum/Nodes: Interval increase in size 1.6 cm (from 1.2 cm) right hilar lymph node. No left hilar lymph node. Stable borderline enlarged 1.1 cm prevascular lymph node. (3:103). No axillary lymphadenopathy. Lungs/Pleura: Interval increase in size of a lobulated right upper lobe 2.7 x 3.7 X 3.3cm (from 2.4 x 3.6 x 2.8 cm) pulmonary mass. Interval increase in conspicuity and increase in size of a nodule like right lower lobe density measuring up to 2.6 cm (from 1.5 cm) (4:85). Interval decrease in size of a trace right pleural effusion. No left pleural effusion. No pneumothorax. Musculoskeletal: Redemonstration of a densely sclerotic left T12 posterior rib lesion (11:77). Redemonstration of a punctate densely sclerotic  lesion within the left anterolateral third and posterolateral 7th ribs. No acute displaced rib fracture. No acute displaced sternal fracture. Please see below regarding spine. Review of the MIP images confirms the above findings. CT ABDOMEN and PELVIS FINDINGS Hepatobiliary: Interval increase in size of a right hepatic lobe 4.1 x 2.7 cm (from 3 x 2.1 cm) hypodense lesion (1:28). Interval increase in size and conspicuity of a right inferior hepatic lobe 4.3 x 2.1 (from 2.3 x 1 cm) lesion (1:39). Some  areas of the liver appears slightly heterogeneous (1:19) with no definite lesion. No definite evidence of a new hepatic lesion on this single phase contrast study. No gallstones, gallbladder wall thickening, or pericholecystic fluid. No biliary dilatation. Pancreas: No focal lesion. Normal pancreatic contour. No surrounding inflammatory changes. No main pancreatic ductal dilatation. Spleen: Normal in size without focal abnormality. Adrenals/Urinary Tract: No adrenal nodule bilaterally. Bilateral kidneys enhance symmetrically. No nephroureterolithiasis. No hydronephrosis. No hydroureter. On delayed imaging, there is no urothelial wall thickening and there are no filling defects in the opacified portions of the bilateral collecting systems or ureters. The urinary bladder is unremarkable. Stomach/Bowel: Stomach is within normal limits. No evidence of bowel wall thickening or dilatation. Stool throughout the colon. Appendix appears normal. Vascular/Lymphatic: No abdominal aorta or iliac aneurysm. Mild to moderate calcified and noncalcified atherosclerotic plaque of the aorta and its branches. Stable prominent retrocrural lymph nodes measuring up to 8 mm (1:23). Stable prominent retroperitoneal lymph nodes. Stable to slightly decreased in size enlarged right external iliac lymph node open (1:69). Now measuring 1.2 cm (from 1.5 cm). No abdominal or inguinal lymphadenopathy. Reproductive: Prostate is unremarkable. Other: No intraperitoneal free fluid. No intraperitoneal free gas. No organized fluid collection. Musculoskeletal: No acute displaced pelvic fracture. Partially visualized left proximal femur intramedullary nail fixation. No acute displaced fracture of the sacrum. Interval increase in size of a right iliac wing lytic lesion with cortical break (1:68). Redemonstration of a left iliac wing lytic lesion that extends to the superior/fall sacroiliac joint (5:92). Redemonstration of a lytic left sacral lesion (1:68,  5:103). CT THORACIC SPINE: Alignment: Twelve rib-bearing thoracic vertebral bodies are noted. Normal. Vertebrae: Multilevel mild-to-moderate degenerative changes of the spine. Slightly more conspicuous lytic lesion and cortical destruction of the right T11 vertebral body, lamina, and superior facet (11:58, 8:118). Interval development of superior endplate T11 concavity and vague vertebral body height loss. No acute displaced fracture. Paraspinal and other soft tissues: Unremarkable. Disc levels: Maintained. CT LUMBAR SPINE: Alignment: 5 non-rib-bearing lumbar vertebral bodies are noted. Normal. Vertebrae: No acute displaced fracture. No suspicious lytic or blastic osseous lesions. Paraspinal and other soft tissues: Unremarkable Disc levels: Maintained. Review of the MIP images confirms the above findings. IMPRESSION: 1. Interval development of a pathologic compression fracture with less than 5% height loss of the T11 vertebral body with persistent right vertebral body lytic lesion that extends to the posterior elements. 2. Stable tiny nonocclusive central and left lower lobe segmental pulmonary embolus. 3. Interval increase in size of a right upper lobe pulmonary mass and right lower lobe nodular-like lesion. Associated stable to slightly increased right hilar and mediastinal lymph nodes. 4. Interval increase in size of hepatic metastatic lesions. 5. Some stable and some increased in size axial and appendicular skeleton lytic metastatic lesion. 6. Slight interval decrease in trace right pleural effusion. 7.  Aortic Atherosclerosis (ICD10-I70.0). Electronically Signed   By: Iven Finn M.D.   On: 10/10/2020 19:00   CT L-SPINE NO CHARGE  Result Date: 10/10/2020 CLINICAL  DATA:  Bilateral flank pain.  Stage IV metastatic disease. EXAM: CT ANGIOGRAPHY CHEST CT ABDOMEN AND PELVIS WITH CONTRAST CT THORACIC SPINE CT LUMBAR SPINE TECHNIQUE: Multidetector CT imaging of the chest was performed using the standard  protocol during bolus administration of intravenous contrast. Multiplanar CT image reconstructions and MIPs were obtained to evaluate the vascular anatomy. Multidetector CT imaging of the abdomen and pelvis was performed using the standard protocol during bolus administration of intravenous contrast. Multidetector CT images of the thoracic were obtained using the standard protocol without intravenous contrast. Multidetector CT images of the lumbar were obtained using the standard protocol without intravenous contrast. CONTRAST:  173mL OMNIPAQUE IOHEXOL 350 MG/ML SOLN COMPARISON:  CT chest, abdomen, pelvis 09/08/2020. FINDINGS: CTA CHEST FINDINGS Cardiovascular: Left chest wall Port-A-Cath with tip terminating in the region of the superior cavoatrial junction. Satisfactory opacification of the pulmonary arteries to the segmental level. Stable tiny nonocclusive central and left lower lobe segmental pulmonary embolus (124, 3:127). No new pulmonary embolus. Normal heart size. No pericardial effusion. At least mild left anterior descending coronary artery calcifications. Mediastinum/Nodes: Interval increase in size 1.6 cm (from 1.2 cm) right hilar lymph node. No left hilar lymph node. Stable borderline enlarged 1.1 cm prevascular lymph node. (3:103). No axillary lymphadenopathy. Lungs/Pleura: Interval increase in size of a lobulated right upper lobe 2.7 x 3.7 X 3.3cm (from 2.4 x 3.6 x 2.8 cm) pulmonary mass. Interval increase in conspicuity and increase in size of a nodule like right lower lobe density measuring up to 2.6 cm (from 1.5 cm) (4:85). Interval decrease in size of a trace right pleural effusion. No left pleural effusion. No pneumothorax. Musculoskeletal: Redemonstration of a densely sclerotic left T12 posterior rib lesion (11:77). Redemonstration of a punctate densely sclerotic lesion within the left anterolateral third and posterolateral 7th ribs. No acute displaced rib fracture. No acute displaced sternal  fracture. Please see below regarding spine. Review of the MIP images confirms the above findings. CT ABDOMEN and PELVIS FINDINGS Hepatobiliary: Interval increase in size of a right hepatic lobe 4.1 x 2.7 cm (from 3 x 2.1 cm) hypodense lesion (1:28). Interval increase in size and conspicuity of a right inferior hepatic lobe 4.3 x 2.1 (from 2.3 x 1 cm) lesion (1:39). Some areas of the liver appears slightly heterogeneous (1:19) with no definite lesion. No definite evidence of a new hepatic lesion on this single phase contrast study. No gallstones, gallbladder wall thickening, or pericholecystic fluid. No biliary dilatation. Pancreas: No focal lesion. Normal pancreatic contour. No surrounding inflammatory changes. No main pancreatic ductal dilatation. Spleen: Normal in size without focal abnormality. Adrenals/Urinary Tract: No adrenal nodule bilaterally. Bilateral kidneys enhance symmetrically. No nephroureterolithiasis. No hydronephrosis. No hydroureter. On delayed imaging, there is no urothelial wall thickening and there are no filling defects in the opacified portions of the bilateral collecting systems or ureters. The urinary bladder is unremarkable. Stomach/Bowel: Stomach is within normal limits. No evidence of bowel wall thickening or dilatation. Stool throughout the colon. Appendix appears normal. Vascular/Lymphatic: No abdominal aorta or iliac aneurysm. Mild to moderate calcified and noncalcified atherosclerotic plaque of the aorta and its branches. Stable prominent retrocrural lymph nodes measuring up to 8 mm (1:23). Stable prominent retroperitoneal lymph nodes. Stable to slightly decreased in size enlarged right external iliac lymph node open (1:69). Now measuring 1.2 cm (from 1.5 cm). No abdominal or inguinal lymphadenopathy. Reproductive: Prostate is unremarkable. Other: No intraperitoneal free fluid. No intraperitoneal free gas. No organized fluid collection. Musculoskeletal: No acute displaced pelvic  fracture. Partially visualized left proximal femur intramedullary nail fixation. No acute displaced fracture of the sacrum. Interval increase in size of a right iliac wing lytic lesion with cortical break (1:68). Redemonstration of a left iliac wing lytic lesion that extends to the superior/fall sacroiliac joint (5:92). Redemonstration of a lytic left sacral lesion (1:68, 5:103). CT THORACIC SPINE: Alignment: Twelve rib-bearing thoracic vertebral bodies are noted. Normal. Vertebrae: Multilevel mild-to-moderate degenerative changes of the spine. Slightly more conspicuous lytic lesion and cortical destruction of the right T11 vertebral body, lamina, and superior facet (11:58, 8:118). Interval development of superior endplate T11 concavity and vague vertebral body height loss. No acute displaced fracture. Paraspinal and other soft tissues: Unremarkable. Disc levels: Maintained. CT LUMBAR SPINE: Alignment: 5 non-rib-bearing lumbar vertebral bodies are noted. Normal. Vertebrae: No acute displaced fracture. No suspicious lytic or blastic osseous lesions. Paraspinal and other soft tissues: Unremarkable Disc levels: Maintained. Review of the MIP images confirms the above findings. IMPRESSION: 1. Interval development of a pathologic compression fracture with less than 5% height loss of the T11 vertebral body with persistent right vertebral body lytic lesion that extends to the posterior elements. 2. Stable tiny nonocclusive central and left lower lobe segmental pulmonary embolus. 3. Interval increase in size of a right upper lobe pulmonary mass and right lower lobe nodular-like lesion. Associated stable to slightly increased right hilar and mediastinal lymph nodes. 4. Interval increase in size of hepatic metastatic lesions. 5. Some stable and some increased in size axial and appendicular skeleton lytic metastatic lesion. 6. Slight interval decrease in trace right pleural effusion. 7.  Aortic Atherosclerosis (ICD10-I70.0).  Electronically Signed   By: Iven Finn M.D.   On: 10/10/2020 19:00   DG Shoulder Left  Result Date: 10/02/2020 CLINICAL DATA:  Shoulder pain EXAM: LEFT SHOULDER - 2+ VIEW COMPARISON:  None. FINDINGS: Partially visualized left-sided central venous port. No fracture or malalignment. No suspicious bone lesion IMPRESSION: Negative. Electronically Signed   By: Donavan Foil M.D.   On: 10/02/2020 17:00     ASSESSMENT/PLAN:  This is a very pleasant 68 year oldCaucasianmale recently diagnosed with a stage IV (T2b, N3, M1C) non-small cell lung cancer, squamous cell carcinoma presented with right upper lobe lung mass in addition to mediastinal and right supraclavicular lymphadenopathy as well as multiple metastatic bone lesions diagnosed in March 2021.  The patientcompletedpalliative radiotherapy to the painful bone lesions under the care of Dr.Kinard.Last treatment on 01/14/20.  The patient had a stabilization of the impending pathological fracture of the left hip in October 2021.  The patient underwent palliative systemic chemotherapy with carboplatin for an AUC of 5, paclitaxel 175 mg per metered squared, Keytruda 200 mg IV every 3 weeks with Neulasta support.He is status post 9 cycles. This was discontinued due to evidence for disease progression.  The patient recently had evidence for disease progression. His treatment was switched to docetaxel 75 mg/m2 and Cyramza 10 mg/kg IV every 3 weeks with Neulasta support. He is status post 1 cycle and tolerated fair except for fatigue and mouth sores.   The patient was seen with Dr. Julien Nordmann. Labs were reviewed.  I saw a note stating the patient was inquiring about palliative/hospice care. Discussed continuing his treatment versus palliative/hospice care. The patient would like to continue on his same treatment at the same dose at this time.  He will receive cycle #2 today scheduled.  We are planning on performing a restaging CT scan of the  chest, abdomen, and pelvis after cycle #3.  We will see him back for a follow up visit in 3 weeks for evaluation before starting cycle #3.   For his pain control, we will increase his fentanyl to 100 mcg with the intent that this will hopefully reduce the need for his 5 mg oxycodone every 6 hours for breakthrough pain.  He will also follow-up with his orthopedic physician for the shoulder pain.  The patient will continue to take Colace to prevent constipation.  For the patient's bilateral lower extremity edema, the patient's total urine protein was negative today.  Patient was strongly encouraged to increase his dietary protein intake.  He was also advised to elevate his legs and use compression stockings.   The patient was advised to call immediately if he has any concerning symptoms in the interval. The patient voices understanding of current disease status and treatment options and is in agreement with the current care plan. All questions were answered. The patient knows to call the clinic with any problems, questions or concerns. We can certainly see the patient much sooner if necessary   I total time of 27 minutes was spent for this encounter.    No orders of the defined types were placed in this encounter.    Tamerra Merkley L Giani Betzold, PA-C 10/23/20  ADDENDUM: Hematology/Oncology Attending: I had a face-to-face encounter with the patient today.  I reviewed the recent medical records and findings and recommended his care plan.  This is a very pleasant 55 years old white male with stage IV non-small cell lung cancer, squamous cell carcinoma status post induction systemic chemotherapy with carboplatin, paclitaxel and Keytruda for 4 cycles followed by 5 more cycles of single agent maintenance Keytruda which was discontinued secondary to disease progression. I started the patient on second line chemotherapy with docetaxel and Cyramza status post 1 cycle.  He tolerated the first cycle of  his treatment well except for fatigue. He continues to have pain on the left shoulder area which is most likely arthritic in origin and he was seen by orthopedic surgeon and expected to receive steroid injection in this area. The patient also has significant pain from the metastatic bone lesions and he is currently on fentanyl patch 75 mcg/hour every 3 days in addition to oxycodone for breakthrough pain.  His pain is not well controlled with the current regimen and he will require a lot of of the breakthrough pain medication. I recommended for the patient to increase his fentanyl patch up to 100 mcg/hour every 3 days and he will continue on oxycodone for breakthrough pain as needed. I recommended for the patient to proceed with cycle #2 of his systemic chemotherapy today as planned We will arrange for the patient to come back for follow-up visit in around 3 weeks for evaluation before starting cycle #3. The patient was advised to call immediately if he has any concerning symptoms in the interval. The total time spent in the appointment was 31 minutes. Disclaimer: This note was dictated with voice recognition software. Similar sounding words can inadvertently be transcribed and may be missed upon review. Eilleen Kempf, MD 10/23/20

## 2020-10-23 ENCOUNTER — Inpatient Hospital Stay: Payer: 59

## 2020-10-23 ENCOUNTER — Inpatient Hospital Stay (HOSPITAL_BASED_OUTPATIENT_CLINIC_OR_DEPARTMENT_OTHER): Payer: 59 | Admitting: Physician Assistant

## 2020-10-23 ENCOUNTER — Other Ambulatory Visit: Payer: Self-pay

## 2020-10-23 VITALS — HR 105

## 2020-10-23 VITALS — BP 120/79 | HR 106 | Temp 97.8°F | Resp 16 | Ht 71.0 in

## 2020-10-23 DIAGNOSIS — M7989 Other specified soft tissue disorders: Secondary | ICD-10-CM

## 2020-10-23 DIAGNOSIS — C3411 Malignant neoplasm of upper lobe, right bronchus or lung: Secondary | ICD-10-CM | POA: Diagnosis not present

## 2020-10-23 DIAGNOSIS — Z5111 Encounter for antineoplastic chemotherapy: Secondary | ICD-10-CM | POA: Diagnosis not present

## 2020-10-23 DIAGNOSIS — G893 Neoplasm related pain (acute) (chronic): Secondary | ICD-10-CM | POA: Diagnosis not present

## 2020-10-23 DIAGNOSIS — C7951 Secondary malignant neoplasm of bone: Secondary | ICD-10-CM | POA: Diagnosis not present

## 2020-10-23 DIAGNOSIS — C3491 Malignant neoplasm of unspecified part of right bronchus or lung: Secondary | ICD-10-CM

## 2020-10-23 DIAGNOSIS — Z95828 Presence of other vascular implants and grafts: Secondary | ICD-10-CM

## 2020-10-23 LAB — CBC WITH DIFFERENTIAL (CANCER CENTER ONLY)
Abs Immature Granulocytes: 0.14 10*3/uL — ABNORMAL HIGH (ref 0.00–0.07)
Basophils Absolute: 0 10*3/uL (ref 0.0–0.1)
Basophils Relative: 0 %
Eosinophils Absolute: 0 10*3/uL (ref 0.0–0.5)
Eosinophils Relative: 0 %
HCT: 31.1 % — ABNORMAL LOW (ref 39.0–52.0)
Hemoglobin: 9.8 g/dL — ABNORMAL LOW (ref 13.0–17.0)
Immature Granulocytes: 1 %
Lymphocytes Relative: 4 %
Lymphs Abs: 0.6 10*3/uL — ABNORMAL LOW (ref 0.7–4.0)
MCH: 28.1 pg (ref 26.0–34.0)
MCHC: 31.5 g/dL (ref 30.0–36.0)
MCV: 89.1 fL (ref 80.0–100.0)
Monocytes Absolute: 0.8 10*3/uL (ref 0.1–1.0)
Monocytes Relative: 5 %
Neutro Abs: 13.7 10*3/uL — ABNORMAL HIGH (ref 1.7–7.7)
Neutrophils Relative %: 90 %
Platelet Count: 286 10*3/uL (ref 150–400)
RBC: 3.49 MIL/uL — ABNORMAL LOW (ref 4.22–5.81)
RDW: 18.7 % — ABNORMAL HIGH (ref 11.5–15.5)
WBC Count: 15.2 10*3/uL — ABNORMAL HIGH (ref 4.0–10.5)
nRBC: 0 % (ref 0.0–0.2)

## 2020-10-23 LAB — CMP (CANCER CENTER ONLY)
ALT: 16 U/L (ref 0–44)
AST: 16 U/L (ref 15–41)
Albumin: 2.3 g/dL — ABNORMAL LOW (ref 3.5–5.0)
Alkaline Phosphatase: 136 U/L — ABNORMAL HIGH (ref 38–126)
Anion gap: 9 (ref 5–15)
BUN: 6 mg/dL (ref 6–20)
CO2: 27 mmol/L (ref 22–32)
Calcium: 9.4 mg/dL (ref 8.9–10.3)
Chloride: 101 mmol/L (ref 98–111)
Creatinine: 0.59 mg/dL — ABNORMAL LOW (ref 0.61–1.24)
GFR, Estimated: >60 mL/min (ref >60)
Glucose, Bld: 138 mg/dL — ABNORMAL HIGH (ref 70–99)
Potassium: 4.3 mmol/L (ref 3.5–5.1)
Sodium: 137 mmol/L (ref 135–145)
Total Bilirubin: 0.4 mg/dL (ref 0.3–1.2)
Total Protein: 6.9 g/dL (ref 6.5–8.1)

## 2020-10-23 LAB — TOTAL PROTEIN, URINE DIPSTICK: Protein, ur: NEGATIVE mg/dL

## 2020-10-23 MED ORDER — SODIUM CHLORIDE 0.9 % IV SOLN
10.0000 mg/kg | Freq: Once | INTRAVENOUS | Status: AC
  Administered 2020-10-23: 1100 mg via INTRAVENOUS
  Filled 2020-10-23: qty 100

## 2020-10-23 MED ORDER — SODIUM CHLORIDE 0.9% FLUSH
10.0000 mL | Freq: Once | INTRAVENOUS | Status: AC
  Administered 2020-10-23: 10 mL
  Filled 2020-10-23: qty 10

## 2020-10-23 MED ORDER — SODIUM CHLORIDE 0.9 % IV SOLN
Freq: Once | INTRAVENOUS | Status: AC
  Filled 2020-10-23: qty 250

## 2020-10-23 MED ORDER — SODIUM CHLORIDE 0.9 % IV SOLN
75.0000 mg/m2 | Freq: Once | INTRAVENOUS | Status: AC
  Administered 2020-10-23: 180 mg via INTRAVENOUS
  Filled 2020-10-23: qty 18

## 2020-10-23 MED ORDER — FENTANYL 100 MCG/HR TD PT72: 1.0000 | MEDICATED_PATCH | TRANSDERMAL | 0 refills | Status: DC

## 2020-10-23 MED ORDER — DIPHENHYDRAMINE HCL 50 MG/ML IJ SOLN
INTRAMUSCULAR | Status: AC
  Filled 2020-10-23: qty 1

## 2020-10-23 MED ORDER — ACETAMINOPHEN 325 MG PO TABS
ORAL_TABLET | ORAL | Status: AC
  Filled 2020-10-23: qty 2

## 2020-10-23 MED ORDER — DIPHENHYDRAMINE HCL 50 MG/ML IJ SOLN
50.0000 mg | Freq: Once | INTRAMUSCULAR | Status: AC
  Administered 2020-10-23: 50 mg via INTRAVENOUS

## 2020-10-23 MED ORDER — SODIUM CHLORIDE 0.9% FLUSH
10.0000 mL | INTRAVENOUS | Status: DC | PRN
  Administered 2020-10-23: 10 mL
  Filled 2020-10-23: qty 10

## 2020-10-23 MED ORDER — ACETAMINOPHEN 325 MG PO TABS
650.0000 mg | ORAL_TABLET | Freq: Once | ORAL | Status: AC
  Administered 2020-10-23: 650 mg via ORAL

## 2020-10-23 MED ORDER — SODIUM CHLORIDE 0.9 % IV SOLN
10.0000 mg | Freq: Once | INTRAVENOUS | Status: AC
  Administered 2020-10-23: 10 mg via INTRAVENOUS
  Filled 2020-10-23: qty 10

## 2020-10-23 MED ORDER — HEPARIN SOD (PORK) LOCK FLUSH 100 UNIT/ML IV SOLN
500.0000 [IU] | Freq: Once | INTRAVENOUS | Status: AC | PRN
  Administered 2020-10-23: 500 [IU]
  Filled 2020-10-23: qty 5

## 2020-10-23 NOTE — Progress Notes (Signed)
Patient used bathroom prior to coming to infusion. When in bathroom, patient stated "I felt a pop in my back and had sharp pain that almost brought me to my knees". Patient denies this ever happening before.  Patient denies pain when sitting, but states he only has a sharp pain when trying to stand up. Sandi Mealy, PA notified to see patient.  Verbal order given for pain medications, but patient states "I don't feel like I need anything for pain right now since it only happens when I am standing". Will continue to follow.   Per Dr. Julien Nordmann, ok to treat with HR 105.  Patient was able to get from infusion chair to personal wheelchair upon completion of infusion.  Patient states that pain is similar to before, sharp when moving but goes away when he sits. Sandi Mealy, PA notified. Patient offered dose of pain meds but declined. Patient advised to take a dose of his home pain meds when he gets home and to apply heat/ice to area for comfort.  Patient advised to call office if pain worsens. Patient verbalized understanding.

## 2020-10-23 NOTE — Progress Notes (Signed)
[facility] Progress Note   Mike [name] [id number] [birth year] 55 y.o.  Mike [name] is managed by Mike [name]. Mike [name]  Actively treated with chemotherapy/immunotherapy/hormonal therapy: yes  Current therapy: docetaxel and Cyramza  Last treated: 10/03/2020 (cycle 1, day 1)  Next scheduled appointment with provider: 10/23/2020 Assessment: Plan:    Chronic left shoulder pain - Plan: oxyCODONE (OXY IR/ROXICODONE) 5 MG immediate release tablet, Ambulatory referral to Orthopedic Surgery   Left shoulder pain: Mike [name] was referred for an x-ray of his cervical spine, left clavicle, and left shoulder on 10/02/2020.  These returned showing no evidence of metastatic lesions.  Based on his ongoing pain his Percocet has been discontinued and he has been changed to oxycodone 5 mg, 1-2 p.o. every 4 hours as needed for pain with 60 dispensed.  He has also been referred back to Mike [name] who is his orthopedic surgeon.  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  Please see After Visit Summary for patient specific instructions.  Future Appointments  Date Time Provider [place]  10/29/2020 12:30 PM Mike David, RN [facility] None  10/30/2020 10:45 AM [facility] None  10/30/2020 11:00 AM [facility] [place] FLUSH [facility] None  11/06/2020 10:45 AM [facility] None  11/06/2020 11:00 AM [facility] [place] FLUSH [facility] None  11/13/2020  8:30 AM [facility] None  11/13/2020  8:45 AM [facility] [place] FLUSH [facility] None  11/13/2020  9:15 AM Mike Bears, MD [facility] None  11/13/2020 10:15 AM [facility] INFUSION [facility] None  12/02/2020  8:45 AM [facility] None  12/02/2020  9:00 AM [facility] [place] None  12/02/2020  9:30 AM Mike Bears, MD  [facility] None  12/05/2020  8:00 AM [facility] PEC  12/12/2020  2:00 PM Mike Bush, MD [facility] PEC    Orders Placed This Encounter  Procedures  . Ambulatory referral to Orthopedic Surgery       Subjective:   Patient ID:  Mike [name] is a 55 y.o. (DOB 1965-12-21) male.  Chief Complaint:  No chief complaint on file.   HPI Mike [name]  is a 55 y.o. male with a diagnosis of a stage IV squamous cell carcinoma of the right lung.  He is managed by Mike [name] and is status post cycle 1 of Cyramza and Taxotere which was dosed on 10/03/2020.  He was last seen on 10/02/2020 and was referred for an x-ray of his cervical spine, left clavicle, and left shoulder.  These returned showing only arthritic changes.  He continues to have significant pain in his left shoulder and has been taking Percocet 1-2 every 4 hours for his pain without good relief of his pain.  He has previously been seen by Mike [name] who is his orthopedic surgeon and completed pinning of his left femur.  Medications: I have reviewed the patient's current medications.  Allergies: No Known Allergies  Past Medical History:  Diagnosis Date  . Arthritis    neck  . Cancer, metastatic to bone ([place]) dx'd 10/2019  . Diabetes ([place])   . Fatty liver 12/05/2019   By [place] 11/2019  . GERD (gastroesophageal reflux disease)   . 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 High cholesterol   . IBS (irritable bowel syndrome)   .  lung ca dx'd 10/2019   lung stage 4   . OSA (obstructive sleep apnea) 09/15/2019   Sleep study 12/2017 - severe OSA with AHI 65.6, desat to 76% rec CPAP  . Severe obesity (BMI 35.0-39.9) with comorbidity (Mike [name]) 09/04/2019    Past Surgical History:  Procedure Laterality Date  . BIOPSY  12/18/2019   Procedure: BIOPSY;  Surgeon: Mike Nash, DO;  Location: [facility];  Service: Pulmonary;;  . BRONCHIAL BRUSHINGS  12/18/2019   Procedure: BRONCHIAL BRUSHINGS;  Surgeon: Mike Nash, DO;   Location: [facility];  Service: Pulmonary;;  . BRONCHIAL WASHINGS  12/18/2019   Procedure: BRONCHIAL WASHINGS;  Surgeon: Mike Nash, DO;  Location: [facility];  Service: Pulmonary;;  . COLONOSCOPY  10/2019   mult TA, int hem, rpt 1 yr (Mike [name])  . CYSTECTOMY     knee- left knee cyst  . ENDOBRONCHIAL ULTRASOUND  12/18/2019   Procedure: ENDOBRONCHIAL ULTRASOUND;  Surgeon: Mike Nash, DO;  Location: [facility];  Service: Pulmonary;;  . FINE NEEDLE ASPIRATION  12/18/2019   Procedure: FINE NEEDLE ASPIRATION;  Surgeon: Mike Nash, DO;  Location: [facility];  Service: Pulmonary;;  . INTRAMEDULLARY (IM) NAIL INTERTROCHANTERIC Left 07/25/2020   Procedure: LEFT INTERTROCHANTERIC INTRAMEDULLARY (IM) NAIL;  Surgeon: Mike Koyanagi, MD;  Location: [facility];  Service: Orthopedics;  Laterality: Left;  . IR IMAGING GUIDED PORT INSERTION  12/27/2019  . PILONIDAL CYST EXCISION    . VIDEO BRONCHOSCOPY WITH ENDOBRONCHIAL ULTRASOUND Right 12/18/2019   Procedure: VIDEO BRONCHOSCOPY;  Surgeon: Mike Nash, DO;  Location: [facility];  Service: Pulmonary;  Laterality: Right;    Family History  Problem Relation Age of Onset  . CAD Mother        stents  . Stroke Neg Hx   . Diabetes Neg Hx   . Cancer Neg Hx   . Colon cancer Neg Hx   . Esophageal cancer Neg Hx   . Stomach cancer Neg Hx   . Rectal cancer Neg Hx     Social History   Socioeconomic History  . Marital status: Divorced    Spouse name: Not on file  . Number of children: Not on file  . Years of education: 81  . Highest education level: 12th grade  Occupational History  . Occupation: Applying for Disability  Tobacco Use  . Smoking status: Current Every Day Smoker    Packs/day: 3.00    Years: 40.00    Pack years: 120.00    Last attempt to quit: 01/18/2020    Years since quitting: 0.7  . Smokeless tobacco: Never Used  . Tobacco comment: Currently smoking about 5 cigs a day  Vaping Use  . Vaping Use: Never used   Substance and Sexual Activity  . Alcohol use: Yes    Comment: Rarely  . Drug use: Never  . Sexual activity: Yes    Partners: Female  Other Topics Concern  . Not on file  Social History Narrative   Lives with brother Mike [name]) and fiancee Mike [name])   36 yo son died remotely   21 yo son died 04/22/2020 of presumed MI (morbidly obese)   Occ: [employer]    Edu: HS, self taught [employer]   Activity:    Diet:    Social Determinants of Health   Financial Resource Strain: Low Risk   . Difficulty of Paying Living Expenses: Not hard at all  Food Insecurity: No Food Insecurity  . Worried About [employer] of  Food in the Last Year: Never true  . Ran Out of Food in the Last Year: Never true  Transportation Needs: No Transportation Needs  . Lack of Transportation (Medical): No  . Lack of Transportation (Non-Medical): No  Physical Activity: Inactive  . Days of Exercise per Week: 0 days  . Minutes of Exercise per Session: 0 min  Stress: No Stress Concern Present  . Feeling of Stress : Only a little  Social Connections: Moderately Isolated  . Frequency of Communication with Friends and Family: More than three times a week  . Frequency of Social Gatherings with Friends and Family: More than three times a week  . Attends Religious Services: More than 4 times per year  . Active Member of Clubs or Organizations: No  . Attends Archivist Meetings: Never  . Marital Status: Divorced  Human resources officer Violence: Not At Risk  . Fear of Current or Ex-Partner: No  . Emotionally Abused: No  . Physically Abused: No  . Sexually Abused: No    Past Medical History, Surgical history, Social history, and Family history were reviewed and updated as appropriate.   Please see review of systems for further details on the patient's review from today.   Review of Systems:  Review of Systems  Constitutional: Negative for chills, diaphoresis and fever.  HENT: Negative for trouble  swallowing and voice change.   Respiratory: Negative for cough, chest tightness, shortness of breath and wheezing.   Cardiovascular: Negative for chest pain and palpitations.  Gastrointestinal: Negative for abdominal pain, constipation, diarrhea, nausea and vomiting.  Musculoskeletal: Positive for arthralgias, gait problem and neck pain. Negative for back pain and myalgias.  Neurological: Negative for dizziness, light-headedness and headaches.    Objective:   Physical Exam:  BP 118/66 (BP Location: Right Arm, Patient Position: Sitting)   Pulse 100   Temp 97.7 F (36.5 C) (Tympanic)   Resp 20   Ht 5\' 11"  (1.803 m)   Wt 246 lb 9.6 oz (111.9 kg)   SpO2 97%   BMI 34.39 kg/m  ECOG: 1  Physical Exam Constitutional:      General: He is not in acute distress.    Appearance: He is not diaphoretic.  HENT:     Head: Normocephalic and atraumatic.  Eyes:     General: No scleral icterus.       Right eye: No discharge.        Left eye: No discharge.     Conjunctiva/sclera: Conjunctivae normal.  Neurological:     Mental Status: He is alert.     Gait: Gait abnormal (The patient is ambulating with a wheelchair.).     Lab Review:     Component Value Date/Time   NA 137 10/23/2020 1116   K 4.3 10/23/2020 1116   CL 101 10/23/2020 1116   CO2 27 10/23/2020 1116   GLUCOSE 138 (H) 10/23/2020 1116   BUN 6 10/23/2020 1116   CREATININE 0.59 (L) 10/23/2020 1116   CALCIUM 9.4 10/23/2020 1116   PROT 6.9 10/23/2020 1116   ALBUMIN 2.3 (L) 10/23/2020 1116   AST 16 10/23/2020 1116   ALT 16 10/23/2020 1116   ALKPHOS 136 (H) 10/23/2020 1116   BILITOT 0.4 10/23/2020 1116   GFRNONAA >60 10/23/2020 1116   GFRAA >60 07/08/2020 0808       Component Value Date/Time   WBC 15.2 (H) 10/23/2020 1116   WBC 5.5 10/10/2020 1524   RBC 3.49 (L) 10/23/2020 1116   HGB 9.8 (L)  10/23/2020 1116   HCT 31.1 (L) 10/23/2020 1116   PLT 286 10/23/2020 1116   MCV 89.1 10/23/2020 1116   MCH 28.1 10/23/2020 1116    MCHC 31.5 10/23/2020 1116   RDW 18.7 (H) 10/23/2020 1116   LYMPHSABS 0.6 (L) 10/23/2020 1116   MONOABS 0.8 10/23/2020 1116   EOSABS 0.0 10/23/2020 1116   BASOSABS 0.0 10/23/2020 1116   -------------------------------  Imaging from last 24 hours (if applicable):  Radiology interpretation: DG Cervical Spine 2-3 Views  Result Date: 10/02/2020 CLINICAL DATA:  Left-sided neck pain history of lung cancer EXAM: CERVICAL SPINE - 2-3 VIEW COMPARISON:  MRI 09/02/2014 FINDINGS: Mild reversal of cervical lordosis. Vertebral body heights are normal. Mild to moderate degenerative changes C4-C5, C5-C6 and C6-C7. The dens and lateral masses are within normal limits IMPRESSION: Mild to moderate degenerative changes, most advanced C4 through C7. No acute osseous abnormality. Electronically Signed   By: Donavan Foil M.D.   On: 10/02/2020 17:00   DG Clavicle Left  Result Date: 10/02/2020 CLINICAL DATA:  Left clavicle pain EXAM: LEFT CLAVICLE - 2+ VIEWS COMPARISON:  None. FINDINGS: No fracture or malalignment.  Mild AC joint degenerative change. IMPRESSION: No acute osseous abnormality. Electronically Signed   By: Donavan Foil M.D.   On: 10/02/2020 17:01   CT Angio Chest PE W and/or Wo Contrast  Result Date: 10/10/2020 CLINICAL DATA:  Bilateral flank pain.  Stage IV metastatic disease. EXAM: CT ANGIOGRAPHY CHEST CT ABDOMEN AND PELVIS WITH CONTRAST CT THORACIC SPINE CT LUMBAR SPINE TECHNIQUE: Multidetector CT imaging of the chest was performed using the standard protocol during bolus administration of intravenous contrast. Multiplanar CT image reconstructions and MIPs were obtained to evaluate the vascular anatomy. Multidetector CT imaging of the abdomen and pelvis was performed using the standard protocol during bolus administration of intravenous contrast. Multidetector CT images of the thoracic were obtained using the standard protocol without intravenous contrast. Multidetector CT images of the lumbar  were obtained using the standard protocol without intravenous contrast. CONTRAST:  124mL OMNIPAQUE IOHEXOL 350 MG/ML SOLN COMPARISON:  CT chest, abdomen, pelvis 09/08/2020. FINDINGS: CTA CHEST FINDINGS Cardiovascular: Left chest wall Port-A-Cath with tip terminating in the region of the superior cavoatrial junction. Satisfactory opacification of the pulmonary arteries to the segmental level. Stable tiny nonocclusive central and left lower lobe segmental pulmonary embolus (124, 3:127). No new pulmonary embolus. Normal heart size. No pericardial effusion. At least mild left anterior descending coronary artery calcifications. Mediastinum/Nodes: Interval increase in size 1.6 cm (from 1.2 cm) right hilar lymph node. No left hilar lymph node. Stable borderline enlarged 1.1 cm prevascular lymph node. (3:103). No axillary lymphadenopathy. Lungs/Pleura: Interval increase in size of a lobulated right upper lobe 2.7 x 3.7 X 3.3cm (from 2.4 x 3.6 x 2.8 cm) pulmonary mass. Interval increase in conspicuity and increase in size of a nodule like right lower lobe density measuring up to 2.6 cm (from 1.5 cm) (4:85). Interval decrease in size of a trace right pleural effusion. No left pleural effusion. No pneumothorax. Musculoskeletal: Redemonstration of a densely sclerotic left T12 posterior rib lesion (11:77). Redemonstration of a punctate densely sclerotic lesion within the left anterolateral third and posterolateral 7th ribs. No acute displaced rib fracture. No acute displaced sternal fracture. Please see below regarding spine. Review of the MIP images confirms the above findings. CT ABDOMEN and PELVIS FINDINGS Hepatobiliary: Interval increase in size of a right hepatic lobe 4.1 x 2.7 cm (from 3 x 2.1 cm) hypodense lesion (1:28). Interval increase  in size and conspicuity of a right inferior hepatic lobe 4.3 x 2.1 (from 2.3 x 1 cm) lesion (1:39). Some areas of the liver appears slightly heterogeneous (1:19) with no definite lesion.  No definite evidence of a new hepatic lesion on this single phase contrast study. No gallstones, gallbladder wall thickening, or pericholecystic fluid. No biliary dilatation. Pancreas: No focal lesion. Normal pancreatic contour. No surrounding inflammatory changes. No main pancreatic ductal dilatation. Spleen: Normal in size without focal abnormality. Adrenals/Urinary Tract: No adrenal nodule bilaterally. Bilateral kidneys enhance symmetrically. No nephroureterolithiasis. No hydronephrosis. No hydroureter. On delayed imaging, there is no urothelial wall thickening and there are no filling defects in the opacified portions of the bilateral collecting systems or ureters. The urinary bladder is unremarkable. Stomach/Bowel: Stomach is within normal limits. No evidence of bowel wall thickening or dilatation. Stool throughout the colon. Appendix appears normal. Vascular/Lymphatic: No abdominal aorta or iliac aneurysm. Mild to moderate calcified and noncalcified atherosclerotic plaque of the aorta and its branches. Stable prominent retrocrural lymph nodes measuring up to 8 mm (1:23). Stable prominent retroperitoneal lymph nodes. Stable to slightly decreased in size enlarged right external iliac lymph node open (1:69). Now measuring 1.2 cm (from 1.5 cm). No abdominal or inguinal lymphadenopathy. Reproductive: Prostate is unremarkable. Other: No intraperitoneal free fluid. No intraperitoneal free gas. No organized fluid collection. Musculoskeletal: No acute displaced pelvic fracture. Partially visualized left proximal femur intramedullary nail fixation. No acute displaced fracture of the sacrum. Interval increase in size of a right iliac wing lytic lesion with cortical break (1:68). Redemonstration of a left iliac wing lytic lesion that extends to the superior/fall sacroiliac joint (5:92). Redemonstration of a lytic left sacral lesion (1:68, 5:103). CT THORACIC SPINE: Alignment: Twelve rib-bearing thoracic vertebral bodies  are noted. Normal. Vertebrae: Multilevel mild-to-moderate degenerative changes of the spine. Slightly more conspicuous lytic lesion and cortical destruction of the right T11 vertebral body, lamina, and superior facet (11:58, 8:118). Interval development of superior endplate T11 concavity and vague vertebral body height loss. No acute displaced fracture. Paraspinal and other soft tissues: Unremarkable. Disc levels: Maintained. CT LUMBAR SPINE: Alignment: 5 non-rib-bearing lumbar vertebral bodies are noted. Normal. Vertebrae: No acute displaced fracture. No suspicious lytic or blastic osseous lesions. Paraspinal and other soft tissues: Unremarkable Disc levels: Maintained. Review of the MIP images confirms the above findings. IMPRESSION: 1. Interval development of a pathologic compression fracture with less than 5% height loss of the T11 vertebral body with persistent right vertebral body lytic lesion that extends to the posterior elements. 2. Stable tiny nonocclusive central and left lower lobe segmental pulmonary embolus. 3. Interval increase in size of a right upper lobe pulmonary mass and right lower lobe nodular-like lesion. Associated stable to slightly increased right hilar and mediastinal lymph nodes. 4. Interval increase in size of hepatic metastatic lesions. 5. Some stable and some increased in size axial and appendicular skeleton lytic metastatic lesion. 6. Slight interval decrease in trace right pleural effusion. 7.  Aortic Atherosclerosis (ICD10-I70.0). Electronically Signed   By: Iven Finn M.D.   On: 10/10/2020 19:00   CT ABDOMEN PELVIS W CONTRAST  Result Date: 10/10/2020 CLINICAL DATA:  Bilateral flank pain.  Stage IV metastatic disease. EXAM: CT ANGIOGRAPHY CHEST CT ABDOMEN AND PELVIS WITH CONTRAST CT THORACIC SPINE CT LUMBAR SPINE TECHNIQUE: Multidetector CT imaging of the chest was performed using the standard protocol during bolus administration of intravenous contrast. Multiplanar CT  image reconstructions and MIPs were obtained to evaluate the vascular anatomy. Multidetector CT imaging  of the abdomen and pelvis was performed using the standard protocol during bolus administration of intravenous contrast. Multidetector CT images of the thoracic were obtained using the standard protocol without intravenous contrast. Multidetector CT images of the lumbar were obtained using the standard protocol without intravenous contrast. CONTRAST:  116mL OMNIPAQUE IOHEXOL 350 MG/ML SOLN COMPARISON:  CT chest, abdomen, pelvis 09/08/2020. FINDINGS: CTA CHEST FINDINGS Cardiovascular: Left chest wall Port-A-Cath with tip terminating in the region of the superior cavoatrial junction. Satisfactory opacification of the pulmonary arteries to the segmental level. Stable tiny nonocclusive central and left lower lobe segmental pulmonary embolus (124, 3:127). No new pulmonary embolus. Normal heart size. No pericardial effusion. At least mild left anterior descending coronary artery calcifications. Mediastinum/Nodes: Interval increase in size 1.6 cm (from 1.2 cm) right hilar lymph node. No left hilar lymph node. Stable borderline enlarged 1.1 cm prevascular lymph node. (3:103). No axillary lymphadenopathy. Lungs/Pleura: Interval increase in size of a lobulated right upper lobe 2.7 x 3.7 X 3.3cm (from 2.4 x 3.6 x 2.8 cm) pulmonary mass. Interval increase in conspicuity and increase in size of a nodule like right lower lobe density measuring up to 2.6 cm (from 1.5 cm) (4:85). Interval decrease in size of a trace right pleural effusion. No left pleural effusion. No pneumothorax. Musculoskeletal: Redemonstration of a densely sclerotic left T12 posterior rib lesion (11:77). Redemonstration of a punctate densely sclerotic lesion within the left anterolateral third and posterolateral 7th ribs. No acute displaced rib fracture. No acute displaced sternal fracture. Please see below regarding spine. Review of the MIP images confirms  the above findings. CT ABDOMEN and PELVIS FINDINGS Hepatobiliary: Interval increase in size of a right hepatic lobe 4.1 x 2.7 cm (from 3 x 2.1 cm) hypodense lesion (1:28). Interval increase in size and conspicuity of a right inferior hepatic lobe 4.3 x 2.1 (from 2.3 x 1 cm) lesion (1:39). Some areas of the liver appears slightly heterogeneous (1:19) with no definite lesion. No definite evidence of a new hepatic lesion on this single phase contrast study. No gallstones, gallbladder wall thickening, or pericholecystic fluid. No biliary dilatation. Pancreas: No focal lesion. Normal pancreatic contour. No surrounding inflammatory changes. No main pancreatic ductal dilatation. Spleen: Normal in size without focal abnormality. Adrenals/Urinary Tract: No adrenal nodule bilaterally. Bilateral kidneys enhance symmetrically. No nephroureterolithiasis. No hydronephrosis. No hydroureter. On delayed imaging, there is no urothelial wall thickening and there are no filling defects in the opacified portions of the bilateral collecting systems or ureters. The urinary bladder is unremarkable. Stomach/Bowel: Stomach is within normal limits. No evidence of bowel wall thickening or dilatation. Stool throughout the colon. Appendix appears normal. Vascular/Lymphatic: No abdominal aorta or iliac aneurysm. Mild to moderate calcified and noncalcified atherosclerotic plaque of the aorta and its branches. Stable prominent retrocrural lymph nodes measuring up to 8 mm (1:23). Stable prominent retroperitoneal lymph nodes. Stable to slightly decreased in size enlarged right external iliac lymph node open (1:69). Now measuring 1.2 cm (from 1.5 cm). No abdominal or inguinal lymphadenopathy. Reproductive: Prostate is unremarkable. Other: No intraperitoneal free fluid. No intraperitoneal free gas. No organized fluid collection. Musculoskeletal: No acute displaced pelvic fracture. Partially visualized left proximal femur intramedullary nail fixation.  No acute displaced fracture of the sacrum. Interval increase in size of a right iliac wing lytic lesion with cortical break (1:68). Redemonstration of a left iliac wing lytic lesion that extends to the superior/fall sacroiliac joint (5:92). Redemonstration of a lytic left sacral lesion (1:68, 5:103). CT THORACIC SPINE: Alignment: Twelve rib-bearing  thoracic vertebral bodies are noted. Normal. Vertebrae: Multilevel mild-to-moderate degenerative changes of the spine. Slightly more conspicuous lytic lesion and cortical destruction of the right T11 vertebral body, lamina, and superior facet (11:58, 8:118). Interval development of superior endplate T11 concavity and vague vertebral body height loss. No acute displaced fracture. Paraspinal and other soft tissues: Unremarkable. Disc levels: Maintained. CT LUMBAR SPINE: Alignment: 5 non-rib-bearing lumbar vertebral bodies are noted. Normal. Vertebrae: No acute displaced fracture. No suspicious lytic or blastic osseous lesions. Paraspinal and other soft tissues: Unremarkable Disc levels: Maintained. Review of the MIP images confirms the above findings. IMPRESSION: 1. Interval development of a pathologic compression fracture with less than 5% height loss of the T11 vertebral body with persistent right vertebral body lytic lesion that extends to the posterior elements. 2. Stable tiny nonocclusive central and left lower lobe segmental pulmonary embolus. 3. Interval increase in size of a right upper lobe pulmonary mass and right lower lobe nodular-like lesion. Associated stable to slightly increased right hilar and mediastinal lymph nodes. 4. Interval increase in size of hepatic metastatic lesions. 5. Some stable and some increased in size axial and appendicular skeleton lytic metastatic lesion. 6. Slight interval decrease in trace right pleural effusion. 7.  Aortic Atherosclerosis (ICD10-I70.0). Electronically Signed   By: Iven Finn M.D.   On: 10/10/2020 19:00   CT  T-SPINE NO CHARGE  Result Date: 10/10/2020 CLINICAL DATA:  Bilateral flank pain.  Stage IV metastatic disease. EXAM: CT ANGIOGRAPHY CHEST CT ABDOMEN AND PELVIS WITH CONTRAST CT THORACIC SPINE CT LUMBAR SPINE TECHNIQUE: Multidetector CT imaging of the chest was performed using the standard protocol during bolus administration of intravenous contrast. Multiplanar CT image reconstructions and MIPs were obtained to evaluate the vascular anatomy. Multidetector CT imaging of the abdomen and pelvis was performed using the standard protocol during bolus administration of intravenous contrast. Multidetector CT images of the thoracic were obtained using the standard protocol without intravenous contrast. Multidetector CT images of the lumbar were obtained using the standard protocol without intravenous contrast. CONTRAST:  159mL OMNIPAQUE IOHEXOL 350 MG/ML SOLN COMPARISON:  CT chest, abdomen, pelvis 09/08/2020. FINDINGS: CTA CHEST FINDINGS Cardiovascular: Left chest wall Port-A-Cath with tip terminating in the region of the superior cavoatrial junction. Satisfactory opacification of the pulmonary arteries to the segmental level. Stable tiny nonocclusive central and left lower lobe segmental pulmonary embolus (124, 3:127). No new pulmonary embolus. Normal heart size. No pericardial effusion. At least mild left anterior descending coronary artery calcifications. Mediastinum/Nodes: Interval increase in size 1.6 cm (from 1.2 cm) right hilar lymph node. No left hilar lymph node. Stable borderline enlarged 1.1 cm prevascular lymph node. (3:103). No axillary lymphadenopathy. Lungs/Pleura: Interval increase in size of a lobulated right upper lobe 2.7 x 3.7 X 3.3cm (from 2.4 x 3.6 x 2.8 cm) pulmonary mass. Interval increase in conspicuity and increase in size of a nodule like right lower lobe density measuring up to 2.6 cm (from 1.5 cm) (4:85). Interval decrease in size of a trace right pleural effusion. No left pleural effusion.  No pneumothorax. Musculoskeletal: Redemonstration of a densely sclerotic left T12 posterior rib lesion (11:77). Redemonstration of a punctate densely sclerotic lesion within the left anterolateral third and posterolateral 7th ribs. No acute displaced rib fracture. No acute displaced sternal fracture. Please see below regarding spine. Review of the MIP images confirms the above findings. CT ABDOMEN and PELVIS FINDINGS Hepatobiliary: Interval increase in size of a right hepatic lobe 4.1 x 2.7 cm (from 3 x 2.1 cm) hypodense  lesion (1:28). Interval increase in size and conspicuity of a right inferior hepatic lobe 4.3 x 2.1 (from 2.3 x 1 cm) lesion (1:39). Some areas of the liver appears slightly heterogeneous (1:19) with no definite lesion. No definite evidence of a new hepatic lesion on this single phase contrast study. No gallstones, gallbladder wall thickening, or pericholecystic fluid. No biliary dilatation. Pancreas: No focal lesion. Normal pancreatic contour. No surrounding inflammatory changes. No main pancreatic ductal dilatation. Spleen: Normal in size without focal abnormality. Adrenals/Urinary Tract: No adrenal nodule bilaterally. Bilateral kidneys enhance symmetrically. No nephroureterolithiasis. No hydronephrosis. No hydroureter. On delayed imaging, there is no urothelial wall thickening and there are no filling defects in the opacified portions of the bilateral collecting systems or ureters. The urinary bladder is unremarkable. Stomach/Bowel: Stomach is within normal limits. No evidence of bowel wall thickening or dilatation. Stool throughout the colon. Appendix appears normal. Vascular/Lymphatic: No abdominal aorta or iliac aneurysm. Mild to moderate calcified and noncalcified atherosclerotic plaque of the aorta and its branches. Stable prominent retrocrural lymph nodes measuring up to 8 mm (1:23). Stable prominent retroperitoneal lymph nodes. Stable to slightly decreased in size enlarged right external  iliac lymph node open (1:69). Now measuring 1.2 cm (from 1.5 cm). No abdominal or inguinal lymphadenopathy. Reproductive: Prostate is unremarkable. Other: No intraperitoneal free fluid. No intraperitoneal free gas. No organized fluid collection. Musculoskeletal: No acute displaced pelvic fracture. Partially visualized left proximal femur intramedullary nail fixation. No acute displaced fracture of the sacrum. Interval increase in size of a right iliac wing lytic lesion with cortical break (1:68). Redemonstration of a left iliac wing lytic lesion that extends to the superior/fall sacroiliac joint (5:92). Redemonstration of a lytic left sacral lesion (1:68, 5:103). CT THORACIC SPINE: Alignment: Twelve rib-bearing thoracic vertebral bodies are noted. Normal. Vertebrae: Multilevel mild-to-moderate degenerative changes of the spine. Slightly more conspicuous lytic lesion and cortical destruction of the right T11 vertebral body, lamina, and superior facet (11:58, 8:118). Interval development of superior endplate T11 concavity and vague vertebral body height loss. No acute displaced fracture. Paraspinal and other soft tissues: Unremarkable. Disc levels: Maintained. CT LUMBAR SPINE: Alignment: 5 non-rib-bearing lumbar vertebral bodies are noted. Normal. Vertebrae: No acute displaced fracture. No suspicious lytic or blastic osseous lesions. Paraspinal and other soft tissues: Unremarkable Disc levels: Maintained. Review of the MIP images confirms the above findings. IMPRESSION: 1. Interval development of a pathologic compression fracture with less than 5% height loss of the T11 vertebral body with persistent right vertebral body lytic lesion that extends to the posterior elements. 2. Stable tiny nonocclusive central and left lower lobe segmental pulmonary embolus. 3. Interval increase in size of a right upper lobe pulmonary mass and right lower lobe nodular-like lesion. Associated stable to slightly increased right hilar and  mediastinal lymph nodes. 4. Interval increase in size of hepatic metastatic lesions. 5. Some stable and some increased in size axial and appendicular skeleton lytic metastatic lesion. 6. Slight interval decrease in trace right pleural effusion. 7.  Aortic Atherosclerosis (ICD10-I70.0). Electronically Signed   By: Iven Finn M.D.   On: 10/10/2020 19:00   CT L-SPINE NO CHARGE  Result Date: 10/10/2020 CLINICAL DATA:  Bilateral flank pain.  Stage IV metastatic disease. EXAM: CT ANGIOGRAPHY CHEST CT ABDOMEN AND PELVIS WITH CONTRAST CT THORACIC SPINE CT LUMBAR SPINE TECHNIQUE: Multidetector CT imaging of the chest was performed using the standard protocol during bolus administration of intravenous contrast. Multiplanar CT image reconstructions and MIPs were obtained to evaluate the vascular anatomy. Multidetector  CT imaging of the abdomen and pelvis was performed using the standard protocol during bolus administration of intravenous contrast. Multidetector CT images of the thoracic were obtained using the standard protocol without intravenous contrast. Multidetector CT images of the lumbar were obtained using the standard protocol without intravenous contrast. CONTRAST:  15mL OMNIPAQUE IOHEXOL 350 MG/ML SOLN COMPARISON:  CT chest, abdomen, pelvis 09/08/2020. FINDINGS: CTA CHEST FINDINGS Cardiovascular: Left chest wall Port-A-Cath with tip terminating in the region of the superior cavoatrial junction. Satisfactory opacification of the pulmonary arteries to the segmental level. Stable tiny nonocclusive central and left lower lobe segmental pulmonary embolus (124, 3:127). No new pulmonary embolus. Normal heart size. No pericardial effusion. At least mild left anterior descending coronary artery calcifications. Mediastinum/Nodes: Interval increase in size 1.6 cm (from 1.2 cm) right hilar lymph node. No left hilar lymph node. Stable borderline enlarged 1.1 cm prevascular lymph node. (3:103). No axillary  lymphadenopathy. Lungs/Pleura: Interval increase in size of a lobulated right upper lobe 2.7 x 3.7 X 3.3cm (from 2.4 x 3.6 x 2.8 cm) pulmonary mass. Interval increase in conspicuity and increase in size of a nodule like right lower lobe density measuring up to 2.6 cm (from 1.5 cm) (4:85). Interval decrease in size of a trace right pleural effusion. No left pleural effusion. No pneumothorax. Musculoskeletal: Redemonstration of a densely sclerotic left T12 posterior rib lesion (11:77). Redemonstration of a punctate densely sclerotic lesion within the left anterolateral third and posterolateral 7th ribs. No acute displaced rib fracture. No acute displaced sternal fracture. Please see below regarding spine. Review of the MIP images confirms the above findings. CT ABDOMEN and PELVIS FINDINGS Hepatobiliary: Interval increase in size of a right hepatic lobe 4.1 x 2.7 cm (from 3 x 2.1 cm) hypodense lesion (1:28). Interval increase in size and conspicuity of a right inferior hepatic lobe 4.3 x 2.1 (from 2.3 x 1 cm) lesion (1:39). Some areas of the liver appears slightly heterogeneous (1:19) with no definite lesion. No definite evidence of a new hepatic lesion on this single phase contrast study. No gallstones, gallbladder wall thickening, or pericholecystic fluid. No biliary dilatation. Pancreas: No focal lesion. Normal pancreatic contour. No surrounding inflammatory changes. No main pancreatic ductal dilatation. Spleen: Normal in size without focal abnormality. Adrenals/Urinary Tract: No adrenal nodule bilaterally. Bilateral kidneys enhance symmetrically. No nephroureterolithiasis. No hydronephrosis. No hydroureter. On delayed imaging, there is no urothelial wall thickening and there are no filling defects in the opacified portions of the bilateral collecting systems or ureters. The urinary bladder is unremarkable. Stomach/Bowel: Stomach is within normal limits. No evidence of bowel wall thickening or dilatation. Stool  throughout the colon. Appendix appears normal. Vascular/Lymphatic: No abdominal aorta or iliac aneurysm. Mild to moderate calcified and noncalcified atherosclerotic plaque of the aorta and its branches. Stable prominent retrocrural lymph nodes measuring up to 8 mm (1:23). Stable prominent retroperitoneal lymph nodes. Stable to slightly decreased in size enlarged right external iliac lymph node open (1:69). Now measuring 1.2 cm (from 1.5 cm). No abdominal or inguinal lymphadenopathy. Reproductive: Prostate is unremarkable. Other: No intraperitoneal free fluid. No intraperitoneal free gas. No organized fluid collection. Musculoskeletal: No acute displaced pelvic fracture. Partially visualized left proximal femur intramedullary nail fixation. No acute displaced fracture of the sacrum. Interval increase in size of a right iliac wing lytic lesion with cortical break (1:68). Redemonstration of a left iliac wing lytic lesion that extends to the superior/fall sacroiliac joint (5:92). Redemonstration of a lytic left sacral lesion (1:68, 5:103). CT THORACIC SPINE: Alignment:  Twelve rib-bearing thoracic vertebral bodies are noted. Normal. Vertebrae: Multilevel mild-to-moderate degenerative changes of the spine. Slightly more conspicuous lytic lesion and cortical destruction of the right T11 vertebral body, lamina, and superior facet (11:58, 8:118). Interval development of superior endplate T11 concavity and vague vertebral body height loss. No acute displaced fracture. Paraspinal and other soft tissues: Unremarkable. Disc levels: Maintained. CT LUMBAR SPINE: Alignment: 5 non-rib-bearing lumbar vertebral bodies are noted. Normal. Vertebrae: No acute displaced fracture. No suspicious lytic or blastic osseous lesions. Paraspinal and other soft tissues: Unremarkable Disc levels: Maintained. Review of the MIP images confirms the above findings. IMPRESSION: 1. Interval development of a pathologic compression fracture with less than  5% height loss of the T11 vertebral body with persistent right vertebral body lytic lesion that extends to the posterior elements. 2. Stable tiny nonocclusive central and left lower lobe segmental pulmonary embolus. 3. Interval increase in size of a right upper lobe pulmonary mass and right lower lobe nodular-like lesion. Associated stable to slightly increased right hilar and mediastinal lymph nodes. 4. Interval increase in size of hepatic metastatic lesions. 5. Some stable and some increased in size axial and appendicular skeleton lytic metastatic lesion. 6. Slight interval decrease in trace right pleural effusion. 7.  Aortic Atherosclerosis (ICD10-I70.0). Electronically Signed   By: Iven Finn M.D.   On: 10/10/2020 19:00   DG Shoulder Left  Result Date: 10/02/2020 CLINICAL DATA:  Shoulder pain EXAM: LEFT SHOULDER - 2+ VIEW COMPARISON:  None. FINDINGS: Partially visualized left-sided central venous port. No fracture or malalignment. No suspicious bone lesion IMPRESSION: Negative. Electronically Signed   By: Donavan Foil M.D.   On: 10/02/2020 17:00   This case was discussed with Mike [name]. He expressed agreement with my management of this patient.     Harle Stanford, PA-C 10/23/20

## 2020-10-23 NOTE — Patient Instructions (Addendum)
[facility] Discharge Instructions for Patients Receiving Chemotherapy  Today you received the following chemotherapy agents: Cyramza/Taxotere.  To help prevent nausea and vomiting after your treatment, we encourage you to take your nausea medication as directed.   If you develop nausea and vomiting that is not controlled by your nausea medication, call the clinic.   BELOW ARE SYMPTOMS THAT SHOULD BE REPORTED IMMEDIATELY:  *FEVER GREATER THAN 100.5 F  *CHILLS WITH OR WITHOUT FEVER  NAUSEA AND VOMITING THAT IS NOT CONTROLLED WITH YOUR NAUSEA MEDICATION  *UNUSUAL SHORTNESS OF BREATH  *UNUSUAL BRUISING OR BLEEDING  TENDERNESS IN MOUTH AND THROAT WITH OR WITHOUT PRESENCE OF ULCERS  *URINARY PROBLEMS  *BOWEL PROBLEMS  UNUSUAL RASH Items with * indicate a potential emergency and should be followed up as soon as possible.  Feel free to call the clinic should you have any questions or concerns. The clinic phone number is [phone].  Please show the [facility] at check-in to the Emergency Department and triage nurse.

## 2020-10-26 ENCOUNTER — Other Ambulatory Visit: Payer: Self-pay | Admitting: Family Medicine

## 2020-10-28 ENCOUNTER — Telehealth: Payer: Self-pay | Admitting: Medical Oncology

## 2020-10-28 ENCOUNTER — Other Ambulatory Visit: Payer: Self-pay | Admitting: Internal Medicine

## 2020-10-28 DIAGNOSIS — G8929 Other chronic pain: Secondary | ICD-10-CM

## 2020-10-28 DIAGNOSIS — M25512 Pain in left shoulder: Secondary | ICD-10-CM

## 2020-10-28 MED ORDER — OXYCODONE HCL 5 MG PO TABS: ORAL_TABLET | ORAL | 0 refills | Status: DC

## 2020-10-28 NOTE — Telephone Encounter (Signed)
Requests refill for Oxycodone.

## 2020-10-29 ENCOUNTER — Other Ambulatory Visit: Payer: Self-pay | Admitting: *Deleted

## 2020-10-29 DIAGNOSIS — C801 Malignant (primary) neoplasm, unspecified: Secondary | ICD-10-CM

## 2020-10-29 NOTE — Patient Outreach (Signed)
[facility]) Care Management  10/29/2020  CHARLTON BOULE [birth year] [id number]   Call placed to member to follow up on pain control.  State his pain has decreased, now on Fentanyl patch 100 mcg Oxycodone IR 5 mg.  He has appointment with ortho on 1/18, focus will be shoulder discomfort.  Will potentially receive cortisone injections for pain control.  He inquires about services hospice can offer, looking to have in home aide support.  He does not have Medicaid, needing to have someone couple times a week to help with hygiene and possible other chores in the home.  Denies any other questions of concerns at this time.  Denies any urgent concerns, encouraged to contact this care manager with questions.  Agrees to follow up within the next month.  Will place referral for care guide for increased support in the home.  Goals Addressed            This Visit's Progress   . [facility] - Keep Pain Under Control   On track    Follow Up Date 10/29/2020  Timeframe:  Long-Range Goal Priority:  High Start Date:      10/15/2020                       Expected End Date:        12/15/2020                 - develop a personal pain management plan - keep track of prescription refills - learn relaxation techniques - plan exercise or activity when pain is best controlled    Why is this important?   Day-to-day life can be hard when you have pain.  Even a small change in emotion or a physical problem can make pain better or worse.  Coping with pain depends on how the mind and body reacts to pain.  Pain medicine is just one piece of the treatment puzzle.  There are many tools to help manage pain. A combination of them can be used to best meet your needs.     Notes:   11/22 - completed - report pain in manageable  12/29 - Discussed increase in pain meds (Fentanyl patch), will request increase in breakthrough pain meds    . [facility] - Make and Keep All Appointments   On track    Follow Up Date  10/29/2020  Timeframe:  Short-Term Goal Priority:  Medium Start Date:        10/15/2020                     Expected End Date:          11/15/2020               - ask family or friend for a ride - call to cancel if needed - keep a calendar with appointment dates    Why is this important?   Part of staying healthy is seeing the doctor for follow-up care.  If you forget your appointments, there are some things you can do to stay on track.    Notes:  Referral to CSW for transportation  11/22 - upcoming appointments reviewed    . [facility] - Manage Fatigue (Tiredness)   On track    Follow Up Date 10/29/2020  Timeframe:  Short-Term Goal Priority:  Medium Start Date:       10/15/2020  Expected End Date:     11/15/2020                    - eat healthy - maintain healthy weight - use meditation or relaxation techniques    Why is this important?   Cancer treatment and its side effects can drain your energy. It can keep you from doing things you would like to do.  There are many things that you can do to manage fatigue.    Notes:       Valente David, RN, MSN [place] [phone]

## 2020-10-30 ENCOUNTER — Inpatient Hospital Stay: Payer: 59

## 2020-10-30 ENCOUNTER — Telehealth: Payer: Self-pay

## 2020-10-30 MED ORDER — OXYCODONE-ACETAMINOPHEN 5-325 MG PO TABS: 1.0000 | ORAL_TABLET | Freq: Two times a day (BID) | ORAL | 0 refills | Status: DC | PRN

## 2020-10-30 NOTE — Addendum Note (Signed)
Addended by: Azucena Cecil on: 10/30/2020 11:21 AM   Modules accepted: Orders

## 2020-10-30 NOTE — Telephone Encounter (Signed)
I sent in percocet

## 2020-10-30 NOTE — Telephone Encounter (Signed)
Patient called stating that he felt something pop in his lower back.  Would like to know if a Rx could be sent to his pharmacy, to help relieve some of the pressure?  Offered patient an appointment for today, but he stated that it wouldn't be able to make it today.  Patient has an appointment for Friday, 10/31/2020.  Cb# [phone].  Please advise.  Thank you

## 2020-10-31 ENCOUNTER — Ambulatory Visit: Payer: 59 | Admitting: Orthopaedic Surgery

## 2020-10-31 NOTE — Addendum Note (Signed)
Addended byValente David on: 10/31/2020 02:13 PM   Modules accepted: Orders

## 2020-11-03 ENCOUNTER — Other Ambulatory Visit: Payer: Self-pay | Admitting: Physician Assistant

## 2020-11-03 ENCOUNTER — Telehealth: Payer: Self-pay | Admitting: Medical Oncology

## 2020-11-03 DIAGNOSIS — C3491 Malignant neoplasm of unspecified part of right bronchus or lung: Secondary | ICD-10-CM

## 2020-11-03 DIAGNOSIS — C7951 Secondary malignant neoplasm of bone: Secondary | ICD-10-CM

## 2020-11-03 DIAGNOSIS — G893 Neoplasm related pain (acute) (chronic): Secondary | ICD-10-CM

## 2020-11-03 MED ORDER — FENTANYL 100 MCG/HR TD PT72: 1.0000 | MEDICATED_PATCH | TRANSDERMAL | 0 refills | Status: DC

## 2020-11-03 NOTE — Telephone Encounter (Addendum)
Pharmacy sent request to refill Fentanyl . He will be out before this weekend.  01/20- weekly lab and flush on thursday

## 2020-11-04 ENCOUNTER — Ambulatory Visit: Payer: 59 | Admitting: Orthopaedic Surgery

## 2020-11-05 ENCOUNTER — Encounter: Payer: Self-pay | Admitting: Internal Medicine

## 2020-11-05 NOTE — Telephone Encounter (Signed)
I spoke with pt and he advised the severe pain started last night and feels as thought he has "been punched in the stomach" on his lower left side. Pt denies fever/chills/nausea/vomiting and constipation. He describes his stool as moist and soft but the "pushing, a sneeze and cough is 10/10 painful and when moving around is 8/10 pain and I don't think I can get out of the bed".  I offered the pt an appointment for today in our [facility] multiple times but pt states he wants to have 911 come take him to the ER. No further assistance was needed at this time.

## 2020-11-06 ENCOUNTER — Emergency Department (HOSPITAL_COMMUNITY): Payer: 59

## 2020-11-06 ENCOUNTER — Ambulatory Visit: Payer: 59 | Admitting: Internal Medicine

## 2020-11-06 ENCOUNTER — Other Ambulatory Visit: Payer: Self-pay

## 2020-11-06 ENCOUNTER — Encounter (HOSPITAL_COMMUNITY): Payer: Self-pay

## 2020-11-06 ENCOUNTER — Inpatient Hospital Stay: Payer: 59

## 2020-11-06 ENCOUNTER — Other Ambulatory Visit: Payer: 59

## 2020-11-06 ENCOUNTER — Inpatient Hospital Stay (HOSPITAL_COMMUNITY)
Admission: EM | Admit: 2020-11-06 | Discharge: 2020-11-08 | DRG: 180 | Disposition: A | Discharge status: Home Health | Payer: 59 | Attending: Student | Admitting: Student

## 2020-11-06 ENCOUNTER — Ambulatory Visit: Payer: 59

## 2020-11-06 DIAGNOSIS — L89151 Pressure ulcer of sacral region, stage 1: Secondary | ICD-10-CM | POA: Diagnosis present

## 2020-11-06 DIAGNOSIS — G893 Neoplasm related pain (acute) (chronic): Secondary | ICD-10-CM | POA: Diagnosis present

## 2020-11-06 DIAGNOSIS — Z7901 Long term (current) use of anticoagulants: Secondary | ICD-10-CM

## 2020-11-06 DIAGNOSIS — R0600 Dyspnea, unspecified: Secondary | ICD-10-CM | POA: Diagnosis present

## 2020-11-06 DIAGNOSIS — R5381 Other malaise: Secondary | ICD-10-CM | POA: Diagnosis present

## 2020-11-06 DIAGNOSIS — R0689 Other abnormalities of breathing: Secondary | ICD-10-CM | POA: Diagnosis present

## 2020-11-06 DIAGNOSIS — Z6832 Body mass index (BMI) 32.0-32.9, adult: Secondary | ICD-10-CM

## 2020-11-06 DIAGNOSIS — Z20822 Contact with and (suspected) exposure to covid-19: Secondary | ICD-10-CM | POA: Diagnosis present

## 2020-11-06 DIAGNOSIS — R0902 Hypoxemia: Secondary | ICD-10-CM

## 2020-11-06 DIAGNOSIS — T40605A Adverse effect of unspecified narcotics, initial encounter: Secondary | ICD-10-CM | POA: Diagnosis present

## 2020-11-06 DIAGNOSIS — J90 Pleural effusion, not elsewhere classified: Secondary | ICD-10-CM | POA: Diagnosis not present

## 2020-11-06 DIAGNOSIS — L8990 Pressure ulcer of unspecified site, unspecified stage: Secondary | ICD-10-CM | POA: Insufficient documentation

## 2020-11-06 DIAGNOSIS — K5903 Drug induced constipation: Secondary | ICD-10-CM | POA: Diagnosis present

## 2020-11-06 DIAGNOSIS — Z79899 Other long term (current) drug therapy: Secondary | ICD-10-CM

## 2020-11-06 DIAGNOSIS — I110 Hypertensive heart disease with heart failure: Secondary | ICD-10-CM | POA: Diagnosis present

## 2020-11-06 DIAGNOSIS — E119 Type 2 diabetes mellitus without complications: Secondary | ICD-10-CM | POA: Diagnosis present

## 2020-11-06 DIAGNOSIS — C3491 Malignant neoplasm of unspecified part of right bronchus or lung: Principal | ICD-10-CM | POA: Diagnosis present

## 2020-11-06 DIAGNOSIS — Z79891 Long term (current) use of opiate analgesic: Secondary | ICD-10-CM

## 2020-11-06 DIAGNOSIS — C787 Secondary malignant neoplasm of liver and intrahepatic bile duct: Secondary | ICD-10-CM | POA: Diagnosis present

## 2020-11-06 DIAGNOSIS — E669 Obesity, unspecified: Secondary | ICD-10-CM | POA: Diagnosis present

## 2020-11-06 DIAGNOSIS — J910 Malignant pleural effusion: Secondary | ICD-10-CM | POA: Diagnosis present

## 2020-11-06 DIAGNOSIS — Z8249 Family history of ischemic heart disease and other diseases of the circulatory system: Secondary | ICD-10-CM

## 2020-11-06 DIAGNOSIS — Z66 Do not resuscitate: Secondary | ICD-10-CM | POA: Diagnosis present

## 2020-11-06 DIAGNOSIS — D638 Anemia in other chronic diseases classified elsewhere: Secondary | ICD-10-CM | POA: Diagnosis present

## 2020-11-06 DIAGNOSIS — Z7984 Long term (current) use of oral hypoglycemic drugs: Secondary | ICD-10-CM

## 2020-11-06 DIAGNOSIS — I2782 Chronic pulmonary embolism: Secondary | ICD-10-CM | POA: Diagnosis present

## 2020-11-06 DIAGNOSIS — J9601 Acute respiratory failure with hypoxia: Secondary | ICD-10-CM | POA: Diagnosis present

## 2020-11-06 DIAGNOSIS — E1169 Type 2 diabetes mellitus with other specified complication: Secondary | ICD-10-CM

## 2020-11-06 DIAGNOSIS — R532 Functional quadriplegia: Secondary | ICD-10-CM | POA: Diagnosis present

## 2020-11-06 DIAGNOSIS — Z9889 Other specified postprocedural states: Secondary | ICD-10-CM

## 2020-11-06 DIAGNOSIS — J918 Pleural effusion in other conditions classified elsewhere: Secondary | ICD-10-CM | POA: Diagnosis present

## 2020-11-06 DIAGNOSIS — C7951 Secondary malignant neoplasm of bone: Secondary | ICD-10-CM | POA: Diagnosis present

## 2020-11-06 DIAGNOSIS — I5033 Acute on chronic diastolic (congestive) heart failure: Secondary | ICD-10-CM | POA: Diagnosis present

## 2020-11-06 LAB — URINALYSIS, ROUTINE W REFLEX MICROSCOPIC
Bilirubin Urine: NEGATIVE
Glucose, UA: NEGATIVE mg/dL
Hgb urine dipstick: NEGATIVE
Ketones, ur: NEGATIVE mg/dL
Leukocytes,Ua: NEGATIVE
Nitrite: NEGATIVE
Protein, ur: NEGATIVE mg/dL
Specific Gravity, Urine: 1.028 (ref 1.005–1.030)
pH: 6 (ref 5.0–8.0)

## 2020-11-06 LAB — CBC WITH DIFFERENTIAL/PLATELET
Abs Immature Granulocytes: 0.22 10*3/uL — ABNORMAL HIGH (ref 0.00–0.07)
Basophils Absolute: 0 10*3/uL (ref 0.0–0.1)
Basophils Relative: 0 %
Eosinophils Absolute: 0 10*3/uL (ref 0.0–0.5)
Eosinophils Relative: 0 %
HCT: 31.6 % — ABNORMAL LOW (ref 39.0–52.0)
Hemoglobin: 9.6 g/dL — ABNORMAL LOW (ref 13.0–17.0)
Immature Granulocytes: 2 %
Lymphocytes Relative: 9 %
Lymphs Abs: 1.1 10*3/uL (ref 0.7–4.0)
MCH: 28.9 pg (ref 26.0–34.0)
MCHC: 30.4 g/dL (ref 30.0–36.0)
MCV: 95.2 fL (ref 80.0–100.0)
Monocytes Absolute: 0.9 10*3/uL (ref 0.1–1.0)
Monocytes Relative: 7 %
Neutro Abs: 9.6 10*3/uL — ABNORMAL HIGH (ref 1.7–7.7)
Neutrophils Relative %: 82 %
Platelets: 212 10*3/uL (ref 150–400)
RBC: 3.32 MIL/uL — ABNORMAL LOW (ref 4.22–5.81)
RDW: 21.7 % — ABNORMAL HIGH (ref 11.5–15.5)
WBC: 11.8 10*3/uL — ABNORMAL HIGH (ref 4.0–10.5)
nRBC: 0.2 % (ref 0.0–0.2)

## 2020-11-06 LAB — COMPREHENSIVE METABOLIC PANEL
ALT: 11 U/L (ref 0–44)
AST: 17 U/L (ref 15–41)
Albumin: 2.6 g/dL — ABNORMAL LOW (ref 3.5–5.0)
Alkaline Phosphatase: 105 U/L (ref 38–126)
Anion gap: 11 (ref 5–15)
BUN: 7 mg/dL (ref 6–20)
CO2: 25 mmol/L (ref 22–32)
Calcium: 8.3 mg/dL — ABNORMAL LOW (ref 8.9–10.3)
Chloride: 101 mmol/L (ref 98–111)
Creatinine, Ser: 0.57 mg/dL — ABNORMAL LOW (ref 0.61–1.24)
GFR, Estimated: >60 mL/min (ref >60)
Glucose, Bld: 93 mg/dL (ref 70–99)
Potassium: 4.3 mmol/L (ref 3.5–5.1)
Sodium: 137 mmol/L (ref 135–145)
Total Bilirubin: 0.2 mg/dL — ABNORMAL LOW (ref 0.3–1.2)
Total Protein: 6.2 g/dL — ABNORMAL LOW (ref 6.5–8.1)

## 2020-11-06 LAB — CBG MONITORING, ED: Glucose-Capillary: 104 mg/dL — ABNORMAL HIGH (ref 70–99)

## 2020-11-06 LAB — POC SARS CORONAVIRUS 2 AG -  ED: SARS Coronavirus 2 Ag: NEGATIVE

## 2020-11-06 LAB — BRAIN NATRIURETIC PEPTIDE: B Natriuretic Peptide: 55 pg/mL (ref 0.0–100.0)

## 2020-11-06 LAB — LIPASE, BLOOD: Lipase: 19 U/L (ref 11–51)

## 2020-11-06 MED ORDER — APIXABAN 5 MG PO TABS
5.0000 mg | ORAL_TABLET | Freq: Two times a day (BID) | ORAL | Status: DC
  Administered 2020-11-06 – 2020-11-08 (×4): 5 mg via ORAL
  Filled 2020-11-06 (×5): qty 1

## 2020-11-06 MED ORDER — FENTANYL 100 MCG/HR TD PT72: 1.0000 | MEDICATED_PATCH | TRANSDERMAL | Status: DC

## 2020-11-06 MED ORDER — METHOCARBAMOL 500 MG PO TABS
500.0000 mg | ORAL_TABLET | Freq: Four times a day (QID) | ORAL | Status: DC | PRN
  Administered 2020-11-08: 500 mg via ORAL
  Filled 2020-11-06: qty 1

## 2020-11-06 MED ORDER — IOHEXOL 350 MG/ML SOLN
100.0000 mL | Freq: Once | INTRAVENOUS | Status: AC | PRN
  Administered 2020-11-06: 100 mL via INTRAVENOUS

## 2020-11-06 MED ORDER — MAGNESIUM CITRATE PO SOLN: 1.0000 | Freq: Once | ORAL | Status: DC | PRN

## 2020-11-06 MED ORDER — FUROSEMIDE 10 MG/ML IJ SOLN
40.0000 mg | Freq: Two times a day (BID) | INTRAMUSCULAR | Status: DC
  Administered 2020-11-06 – 2020-11-08 (×4): 40 mg via INTRAVENOUS
  Filled 2020-11-06 (×4): qty 4

## 2020-11-06 MED ORDER — SODIUM CHLORIDE 0.9% FLUSH: 3.0000 mL | INTRAVENOUS | Status: DC | PRN

## 2020-11-06 MED ORDER — HYDROMORPHONE HCL 1 MG/ML IJ SOLN
1.0000 mg | Freq: Once | INTRAMUSCULAR | Status: AC
Start: 2020-11-06 — End: 2020-11-06
  Administered 2020-11-06: 1 mg via INTRAVENOUS
  Filled 2020-11-06: qty 1

## 2020-11-06 MED ORDER — SODIUM CHLORIDE 0.9 % IV BOLUS
500.0000 mL | Freq: Once | INTRAVENOUS | Status: AC
  Administered 2020-11-06: 500 mL via INTRAVENOUS

## 2020-11-06 MED ORDER — SODIUM CHLORIDE 0.9 % IV SOLN: 250.0000 mL | INTRAVENOUS | Status: DC | PRN

## 2020-11-06 MED ORDER — LORATADINE 10 MG PO TABS
10.0000 mg | ORAL_TABLET | Freq: Every day | ORAL | Status: DC
  Administered 2020-11-07 – 2020-11-08 (×2): 10 mg via ORAL
  Filled 2020-11-06 (×2): qty 1

## 2020-11-06 MED ORDER — DIAZEPAM 5 MG PO TABS: 5.0000 mg | ORAL_TABLET | Freq: Two times a day (BID) | ORAL | Status: DC | PRN

## 2020-11-06 MED ORDER — POLYETHYLENE GLYCOL 3350 17 G PO PACK
17.0000 g | PACK | Freq: Every day | ORAL | Status: DC
  Administered 2020-11-07 – 2020-11-08 (×2): 17 g via ORAL
  Filled 2020-11-06 (×2): qty 1

## 2020-11-06 MED ORDER — INSULIN ASPART 100 UNIT/ML ~~LOC~~ SOLN
0.0000 [IU] | Freq: Three times a day (TID) | SUBCUTANEOUS | Status: DC
  Filled 2020-11-06: qty 0.09

## 2020-11-06 MED ORDER — ATORVASTATIN CALCIUM 10 MG PO TABS
10.0000 mg | ORAL_TABLET | Freq: Every day | ORAL | Status: DC
  Administered 2020-11-06 – 2020-11-08 (×3): 10 mg via ORAL
  Filled 2020-11-06 (×3): qty 1

## 2020-11-06 MED ORDER — ONDANSETRON HCL 4 MG/2ML IJ SOLN
4.0000 mg | Freq: Four times a day (QID) | INTRAMUSCULAR | Status: DC | PRN
  Administered 2020-11-08: 4 mg via INTRAVENOUS
  Filled 2020-11-06 (×2): qty 2

## 2020-11-06 MED ORDER — ALBUTEROL SULFATE (2.5 MG/3ML) 0.083% IN NEBU: 2.5000 mg | INHALATION_SOLUTION | Freq: Four times a day (QID) | RESPIRATORY_TRACT | Status: DC | PRN

## 2020-11-06 MED ORDER — SENNOSIDES-DOCUSATE SODIUM 8.6-50 MG PO TABS
2.0000 | ORAL_TABLET | Freq: Two times a day (BID) | ORAL | Status: DC
  Administered 2020-11-06 – 2020-11-08 (×4): 2 via ORAL
  Filled 2020-11-06 (×4): qty 2

## 2020-11-06 MED ORDER — HYDROMORPHONE HCL 1 MG/ML IJ SOLN
1.0000 mg | INTRAMUSCULAR | Status: DC | PRN
  Administered 2020-11-06 – 2020-11-08 (×6): 1 mg via INTRAVENOUS
  Filled 2020-11-06 (×6): qty 1

## 2020-11-06 MED ORDER — HYDROMORPHONE HCL 1 MG/ML IJ SOLN
1.0000 mg | Freq: Once | INTRAMUSCULAR | Status: AC
  Administered 2020-11-06: 1 mg via INTRAVENOUS
  Filled 2020-11-06: qty 1

## 2020-11-06 MED ORDER — ONDANSETRON HCL 4 MG PO TABS: 4.0000 mg | ORAL_TABLET | Freq: Four times a day (QID) | ORAL | Status: DC | PRN

## 2020-11-06 MED ORDER — SODIUM CHLORIDE 0.9% FLUSH
3.0000 mL | Freq: Two times a day (BID) | INTRAVENOUS | Status: DC
  Administered 2020-11-06: 3 mL via INTRAVENOUS

## 2020-11-06 MED ORDER — HYDROMORPHONE HCL 1 MG/ML IJ SOLN
1.0000 mg | Freq: Once | INTRAMUSCULAR | Status: DC
Start: 2020-11-06 — End: 2020-11-06

## 2020-11-06 NOTE — Progress Notes (Signed)
Patient brought to [place] for thoracentesis.  Unable to position, only comfortable flat on back.  States he cannot sit up on the side of the bed.  Attempted to roll onto the left with assistance from [place] tech and PA, however patient could not tolerate due to back and shoulder pain.  Despite multiple attempts to get into position, patient could not roll far enough to even assess for fluid much less hold position for 15 min or more for procedure.  Patient believes he is being admitted to the hospital.  If he remains inpatient could re-attempt tomorrow once pain control established.  If patient discharges home overnight, please place an outpatient order for thoracentesis.   Patient presented with abdominal pain, currently on nasal cannula due to O2 sat 88% at home when EMS arrived. He is not in respiratory distress.  Attempted to contact ED x3, however RN unavailable.   Brynda Greathouse, MS RD PA-C

## 2020-11-06 NOTE — H&P (Signed)
History and Physical    Mike [name] ZOX:[id number] DOB: 1966/07/12 DOA: 11/06/2020  I have briefly reviewed the patient's prior medical records in [place]  PCP: [name], MD  Patient coming from: home  Chief Complaint: worsening back pain, shortness of breath   HPI: Mike [name] is a 55 y.o. male with medical history significant of stage IV non-small cell lung cancer, squamous cell carcinoma, followed by Dr. Earlie Server currently on palliative systemic chemotherapy with docetaxel and Cyramza, obesity, history of PEs,hyperlipidemia, diabetes mellitus, presents to the hospital with complaints of increasing back pain over the last several days, as well as shortness of breath.  Patient tells me that over the last several days he has been having severe mid lower back pain as well as pain on the left side of his back to the point that he can barely move.  Patient is known to have widespread osseous metastasis.  He also has been having left shoulder plain without clear etiology other than arthritis but that does not bother him much right now.  He denies any fever or chills.  He also tells me he has noticed worsening lower extremity swelling over the last several weeks, was being given a "water pill" but did not do much.  No chest pain, no abdominal pain, no nausea or vomiting.  He complains of severe constipation.  ED Course: In the ED he was afebrile, normotensive and hypoxic on room air requiring 2 L nasal cannula.  Due to severe pain underwent CT abdomen pelvis with contrast as well as a CT angiogram which showed known metastatic disease, liver mets stable to mildly increased, constipation, and chronic eccentric small nonocclusive bilateral pulmonary emboli, unchanged from prior scans.  It also showed a new moderately dependent right-sided pleural effusion.  Thoracentesis was attempted by IR to address the right-sided pleural effusion however due to excruciating pain despite 2 mg of  Dilaudid this could not be performed.  We are asked to admit for pain control and possible repeat thoracentesis in the morning.  Review of Systems: All systems reviewed, and apart from HPI, all negative  Past Medical History:  Diagnosis Date  . Arthritis    neck  . Cancer, metastatic to bone ([place]) dx'd 10/2019  . Diabetes ([place])   . Fatty liver 12/05/2019   By [place] 11/2019  . GERD (gastroesophageal reflux disease)   . 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 High cholesterol   . IBS (irritable bowel syndrome)   . lung ca dx'd 10/2019   lung stage 4   . OSA (obstructive sleep apnea) 09/15/2019   Sleep study 12/2017 - severe OSA with AHI 65.6, desat to 76% rec CPAP  . Severe obesity (BMI 35.0-39.9) with comorbidity ([place]) 09/04/2019    Past Surgical History:  Procedure Laterality Date  . BIOPSY  12/18/2019   Procedure: BIOPSY;  Surgeon: Garner Nash, DO;  Location: [facility];  Service: Pulmonary;;  . BRONCHIAL BRUSHINGS  12/18/2019   Procedure: BRONCHIAL BRUSHINGS;  Surgeon: Garner Nash, DO;  Location: [facility];  Service: Pulmonary;;  . BRONCHIAL WASHINGS  12/18/2019   Procedure: BRONCHIAL WASHINGS;  Surgeon: Garner Nash, DO;  Location: [facility];  Service: Pulmonary;;  . COLONOSCOPY  10/2019   mult TA, int hem, rpt 1 yr ([place])  . CYSTECTOMY     knee- left knee cyst  . ENDOBRONCHIAL ULTRASOUND  12/18/2019   Procedure: ENDOBRONCHIAL ULTRASOUND;  Surgeon: Garner Nash,  DO;  Location: [facility];  Service: Pulmonary;;  . FINE NEEDLE ASPIRATION  12/18/2019   Procedure: FINE NEEDLE ASPIRATION;  Surgeon: Garner Nash, DO;  Location: [facility];  Service: Pulmonary;;  . INTRAMEDULLARY (IM) NAIL INTERTROCHANTERIC Left 07/25/2020   Procedure: LEFT INTERTROCHANTERIC INTRAMEDULLARY (IM) NAIL;  Surgeon: Leandrew Koyanagi, MD;  Location: [facility];  Service: Orthopedics;  Laterality: Left;  . IR IMAGING GUIDED PORT INSERTION  12/27/2019  . PILONIDAL CYST EXCISION    .  VIDEO BRONCHOSCOPY WITH ENDOBRONCHIAL ULTRASOUND Right 12/18/2019   Procedure: VIDEO BRONCHOSCOPY;  Surgeon: Garner Nash, DO;  Location: [facility];  Service: Pulmonary;  Laterality: Right;     reports that he has been smoking. He has a 120.00 pack-year smoking history. He has never used smokeless tobacco. He reports current alcohol use. He reports that he does not use drugs.  No Known Allergies  Family History  Problem Relation Age of Onset  . CAD Mother        stents  . Stroke Neg Hx   . Diabetes Neg Hx   . Cancer Neg Hx   . Colon cancer Neg Hx   . Esophageal cancer Neg Hx   . Stomach cancer Neg Hx   . Rectal cancer Neg Hx     Prior to Admission medications   Medication Sig Start Date End Date Taking? Authorizing Provider  albuterol (PROVENTIL) (2.5 MG/3ML) 0.083% nebulizer solution Take 3 mLs (2.5 mg total) by nebulization every 6 (six) hours as needed for wheezing or shortness of breath. 01/09/20  Yes Icard, Bradley L, DO  atorvastatin (LIPITOR) 20 MG tablet TAKE 1/2 TABLET BY MOUTH DAILY Patient taking differently: Take 10 mg by mouth daily. 10/27/20  Yes [name], MD  dexamethasone (DECADRON) 4 MG tablet Take 2 tablets TWICE a day the day before, the day of, and the day after chemotherapy. 09/25/20  Yes [name], [name], PA-C  diazepam (VALIUM) 5 MG tablet Take 1 tablet (5 mg total) by mouth every 12 (twelve) hours as needed for anxiety or muscle spasms. 09/25/20  Yes [name], MD  docusate sodium (COLACE) 100 MG capsule Take 1 capsule (100 mg total) by mouth 2 (two) times daily. 07/28/20  Yes McClung, Kimberlee Nearing, MD  ELIQUIS 5 MG TABS tablet TAKE 1 TABLET BY MOUTH TWICE A DAY Patient taking differently: Take 5 mg by mouth 2 (two) times daily. 09/19/20  Yes [name], MD  fentaNYL (DURAGESIC) 100 MCG/HR Place 1 patch onto the skin every 3 (three) days. 11/03/20  Yes [name], [name], PA-C  loratadine (CLARITIN) 10 MG tablet Take 10 mg  by mouth daily.   Yes [provider]  magic mouthwash SOLN Take 5 mLs by mouth 4 (four) times daily. 10/08/20  Yes [name], [name], PA-C  metFORMIN (GLUCOPHAGE) 1000 MG tablet Take 1 tablet (1,000 mg total) by mouth 2 (two) times daily with a meal. 09/08/20  Yes [name], MD  methocarbamol (ROBAXIN) 500 MG tablet Take 1 tablet (500 mg total) by mouth every 6 (six) hours as needed for muscle spasms. 10/09/20  Yes Leandrew Koyanagi, MD  oxyCODONE (OXY IR/ROXICODONE) 5 MG immediate release tablet 1 to 2 PO Q 4 hours prn pain Patient taking differently: Take 5-10 mg by mouth every 4 (four) hours as needed for severe pain. 10/28/20  Yes [name], MD  oxyCODONE-acetaminophen (PERCOCET) 5-325 MG tablet Take 1-2 tablets by mouth 2 (two) times daily as needed for severe pain. 10/30/20  Yes Leandrew Koyanagi, MD  sildenafil (REVATIO) 20 MG tablet Take 1-5 tablets (20-100 mg total) by mouth daily as needed (ED). Patient taking differently: Take 40 mg by mouth daily as needed (ED). 06/08/20  Yes [name], MD  ZIEXTENZO 6 MG/0.6ML injection Inject 6 mg into the skin every 21 ( twenty-one) days. 10/22/20  Yes [provider]  furosemide (LASIX) 20 MG tablet Take 1 tablet (20 mg total) by mouth daily. Patient not taking: Reported on 11/06/2020 08/21/20   Gery Pray, MD    Physical Exam: Vitals:   11/06/20 1415 11/06/20 1530 11/06/20 1639 11/06/20 1700  BP: 126/77 120/82 119/83 110/71  Pulse: (!) 104 (!) 101 (!) 101 (!) 101  Resp: 15 15 14 18   Temp:      TempSrc:      SpO2: 97% 95% 94% 92%   Constitutional: Patient seems to be uncomfortable, speaking in short sentences and barely able to breathe due to pain in his back Eyes: PERRL, lids and conjunctivae normal ENMT: Mucous membranes are moist.  Neck: normal, supple Respiratory: Lungs are diminished at the bases, no wheezing or crackles heard.  Shallow respirations Cardiovascular: Regular rate and rhythm, no  murmurs / rubs / gallops.  2+ pitting lower extremity edema bilateral lower extremities Abdomen: no tenderness, no masses palpated. Bowel sounds positive.  Musculoskeletal: no clubbing / cyanosis. Normal muscle tone.  Skin: no rashes, lesions, ulcers. No induration Neurologic: CN 2-12 grossly intact. Strength 5/5 in all 4.  Psychiatric: Normal judgment and insight. Alert and oriented x 3. Normal mood.   Labs on Admission: I have personally reviewed following labs and imaging studies  CBC: Recent Labs  Lab 11/06/20 1009  WBC 11.8*  NEUTROABS 9.6*  HGB 9.6*  HCT 31.6*  MCV 95.2  PLT 119   Basic Metabolic Panel: Recent Labs  Lab 11/06/20 1009  NA 137  K 4.3  CL 101  CO2 25  GLUCOSE 93  BUN 7  CREATININE 0.57*  CALCIUM 8.3*   Liver Function Tests: Recent Labs  Lab 11/06/20 1009  AST 17  ALT 11  ALKPHOS 105  BILITOT 0.2*  PROT 6.2*  ALBUMIN 2.6*   Coagulation Profile: No results for input(s): INR, PROTIME in the last 168 hours. BNP (last 3 results) No results for input(s): PROBNP in the last 8760 hours. CBG: No results for input(s): GLUCAP in the last 168 hours. Thyroid Function Tests: No results for input(s): TSH, T4TOTAL, FREET4, T3FREE, THYROIDAB in the last 72 hours. Urine analysis:    Component Value Date/Time   COLORURINE YELLOW 11/06/2020 1640   APPEARANCEUR CLEAR 11/06/2020 1640   LABSPEC 1.028 11/06/2020 1640   PHURINE 6.0 11/06/2020 1640   GLUCOSEU NEGATIVE 11/06/2020 1640   HGBUR NEGATIVE 11/06/2020 1640   BILIRUBINUR NEGATIVE 11/06/2020 1640   BILIRUBINUR negative 09/04/2019 1014   KETONESUR NEGATIVE 11/06/2020 1640   PROTEINUR NEGATIVE 11/06/2020 1640   UROBILINOGEN 0.2 09/04/2019 1014   NITRITE NEGATIVE 11/06/2020 1640   LEUKOCYTESUR NEGATIVE 11/06/2020 1640     Radiological Exams on Admission: CT Angio Chest PE W/Cm &/Or Wo Cm  Result Date: 11/06/2020 CLINICAL DATA:  Stage IV right lung cancer. History of pulmonary embolism.  Worsening left abdominal pain and diarrhea for few days. Hypoxia. EXAM: CT ANGIOGRAPHY CHEST CT ABDOMEN AND PELVIS WITH CONTRAST TECHNIQUE: Multidetector CT imaging of the chest was performed using the standard protocol during bolus administration of intravenous contrast. Multiplanar CT image reconstructions and MIPs were obtained to evaluate the vascular  anatomy. Multidetector CT imaging of the abdomen and pelvis was performed using the standard protocol during bolus administration of intravenous contrast. CONTRAST:  129mL OMNIPAQUE IOHEXOL 350 MG/ML SOLN COMPARISON:  10/10/2020 chest CT angiogram and CT abdomen/pelvis. FINDINGS: CTA CHEST FINDINGS Cardiovascular: The study is low to moderate quality for the evaluation of pulmonary embolism, with significant motion degradation. Chronic eccentric small nonocclusive bilateral pulmonary emboli in the peripheral right pulmonary artery (series 5/image 132) and segmental left lower lobe pulmonary artery branches (series 5/image 133), unchanged. Atherosclerotic nonaneurysmal thoracic aorta. Top-normal caliber main pulmonary artery (3.3 cm diameter). Normal heart size. Stable trace pericardial effusion/thickening. Left internal jugular Port-A-Cath terminates at the cavoatrial junction. Mediastinum/Nodes: No discrete thyroid nodules. Unremarkable esophagus. Mildly enlarged 1.0 cm left supraclavicular node (series 4/image 11), stable. No axillary adenopathy. Mildly enlarged 1.0 cm right hilar node (series 4/image 43), stable. No left hilar adenopathy. Mildly enlarged 1.0 cm AP window node (series 4/image 32), stable. Lungs/Pleura: No pneumothorax. Moderate dependent right pleural effusion largely new. No left pleural effusion. Cavitary irregular 4.5 x 2.8 cm posterior right upper lobe lung mass (series 6/image 49), previously 4.5 x 2.7 cm using similar measurement technique, stable. Passive atelectasis in the dependent right lower lobe. No new significant pulmonary nodules.  Musculoskeletal: Expansile lytic lesion in the medial left clavicle measures 5.4 cm (series 6/image 19), increased from 4.7 cm on 10/10/2020 CT. Lytic 2.2 cm left humeral head lesion (series 6/image 6), increased from 1.8 cm. Stable lytic sternal lesion. Stable lytic expansile 5.6 cm right T11 posterior elements lesion (series 6/image 131) with stable mild superior T11 vertebral compression fracture. No new focal osseous lesions in the chest. Moderate thoracic spondylosis. Mild gynecomastia asymmetric to the right, stable. Review of the MIP images confirms the above findings. CT ABDOMEN and PELVIS FINDINGS Hepatobiliary: Three scattered hypodense right liver masses, stable to mildly increased. Segment 7 right liver 1.3 cm mass (series 1/image 15), increased from 0.8 cm. Inferior right liver 4.1 cm mass adjacent to the gallbladder (series 1/image 28), slightly increased from 3.8 cm. Far inferior right liver 3.3 cm mass (series 1/image 37), stable. Normal gallbladder with no radiopaque cholelithiasis. No biliary ductal dilatation. Pancreas: Normal, with no mass or duct dilation. Spleen: Normal size. No mass. Adrenals/Urinary Tract: Normal adrenals. Normal kidneys with no hydronephrosis and no renal mass. Normal bladder. Stomach/Bowel: Normal non-distended stomach. Normal caliber small bowel with no small bowel wall thickening. Normal appendix. Moderate colonic stool. No large bowel wall thickening, diverticulosis or significant pericolonic fat stranding. Mild presacral space fat stranding and ill-defined fluid is not significantly changed and is nonspecific. Vascular/Lymphatic: Atherosclerotic abdominal aorta with ectatic 2.8 cm infrarenal abdominal aorta, stable. Patent portal, splenic, hepatic and renal veins. No pathologically enlarged lymph nodes in the abdomen or pelvis. Reproductive: Normal size prostate. Other: No pneumoperitoneum, ascites or focal fluid collection. Musculoskeletal: Numerous lytic lesions  throughout the pelvis including right upper and lower left sacrum, medial left iliac bone, right iliac wing and posterior right acetabulum, not appreciably changed since 10/10/2020 CT. No new focal osseous lesions. Mild lumbar spondylosis. Review of the MIP images confirms the above findings. IMPRESSION: 1. Limited motion degraded scan. No evidence of acute pulmonary embolism. Chronic eccentric small nonocclusive bilateral pulmonary emboli, unchanged. 2. Cavitary posterior right upper lobe lung mass, stable. 3. Moderate dependent right pleural effusion, largely new. 4. 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 5. Right liver metastases are stable  to mildly increased. 6. Moderate colonic stool volume, suggesting constipation. No evidence of bowel obstruction or acute bowel inflammation. 7. Stable ectatic 2.8 cm infrarenal abdominal aorta. Recommend follow-up ultrasound every 5 years. This recommendation follows ACR consensus guidelines: White Paper of the ACR Incidental Findings Committee II on Vascular Findings. J Am Coll Radiol 2013; 10:789-794. 8. Aortic Atherosclerosis (ICD10-I70.0). Electronically Signed   By: Ilona Sorrel M.D.   On: 11/06/2020 11:51   CT Abdomen Pelvis W Contrast  Result Date: 11/06/2020 CLINICAL DATA:  Stage IV right lung cancer. History of pulmonary embolism. Worsening left abdominal pain and diarrhea for few days. Hypoxia. EXAM: CT ANGIOGRAPHY CHEST CT ABDOMEN AND PELVIS WITH CONTRAST TECHNIQUE: Multidetector CT imaging of the chest was performed using the standard protocol during bolus administration of intravenous contrast. Multiplanar CT image reconstructions and MIPs were obtained to evaluate the vascular anatomy. Multidetector CT imaging of the abdomen and pelvis was performed using the standard protocol during bolus administration of intravenous contrast. CONTRAST:  121mL  OMNIPAQUE IOHEXOL 350 MG/ML SOLN COMPARISON:  10/10/2020 chest CT angiogram and CT abdomen/pelvis. FINDINGS: CTA CHEST FINDINGS Cardiovascular: The study is low to moderate quality for the evaluation of pulmonary embolism, with significant motion degradation. Chronic eccentric small nonocclusive bilateral pulmonary emboli in the peripheral right pulmonary artery (series 5/image 132) and segmental left lower lobe pulmonary artery branches (series 5/image 133), unchanged. Atherosclerotic nonaneurysmal thoracic aorta. Top-normal caliber main pulmonary artery (3.3 cm diameter). Normal heart size. Stable trace pericardial effusion/thickening. Left internal jugular Port-A-Cath terminates at the cavoatrial junction. Mediastinum/Nodes: No discrete thyroid nodules. Unremarkable esophagus. Mildly enlarged 1.0 cm left supraclavicular node (series 4/image 11), stable. No axillary adenopathy. Mildly enlarged 1.0 cm right hilar node (series 4/image 43), stable. No left hilar adenopathy. Mildly enlarged 1.0 cm AP window node (series 4/image 32), stable. Lungs/Pleura: No pneumothorax. Moderate dependent right pleural effusion largely new. No left pleural effusion. Cavitary irregular 4.5 x 2.8 cm posterior right upper lobe lung mass (series 6/image 49), previously 4.5 x 2.7 cm using similar measurement technique, stable. Passive atelectasis in the dependent right lower lobe. No new significant pulmonary nodules. Musculoskeletal: Expansile lytic lesion in the medial left clavicle measures 5.4 cm (series 6/image 19), increased from 4.7 cm on 10/10/2020 CT. Lytic 2.2 cm left humeral head lesion (series 6/image 6), increased from 1.8 cm. Stable lytic sternal lesion. Stable lytic expansile 5.6 cm right T11 posterior elements lesion (series 6/image 131) with stable mild superior T11 vertebral compression fracture. No new focal osseous lesions in the chest. Moderate thoracic spondylosis. Mild gynecomastia asymmetric to the right, stable.  Review of the MIP images confirms the above findings. CT ABDOMEN and PELVIS FINDINGS Hepatobiliary: Three scattered hypodense right liver masses, stable to mildly increased. Segment 7 right liver 1.3 cm mass (series 1/image 15), increased from 0.8 cm. Inferior right liver 4.1 cm mass adjacent to the gallbladder (series 1/image 28), slightly increased from 3.8 cm. Far inferior right liver 3.3 cm mass (series 1/image 37), stable. Normal gallbladder with no radiopaque cholelithiasis. No biliary ductal dilatation. Pancreas: Normal, with no mass or duct dilation. Spleen: Normal size. No mass. Adrenals/Urinary Tract: Normal adrenals. Normal kidneys with no hydronephrosis and no renal mass. Normal bladder. Stomach/Bowel: Normal non-distended stomach. Normal caliber small bowel with no small bowel wall thickening. Normal appendix. Moderate colonic stool. No large bowel wall thickening, diverticulosis or significant pericolonic fat stranding. Mild presacral space fat stranding and ill-defined fluid is not significantly changed and is nonspecific. Vascular/Lymphatic: Atherosclerotic abdominal aorta  with ectatic 2.8 cm infrarenal abdominal aorta, stable. Patent portal, splenic, hepatic and renal veins. No pathologically enlarged lymph nodes in the abdomen or pelvis. Reproductive: Normal size prostate. Other: No pneumoperitoneum, ascites or focal fluid collection. Musculoskeletal: Numerous lytic lesions throughout the pelvis including right upper and lower left sacrum, medial left iliac bone, right iliac wing and posterior right acetabulum, not appreciably changed since 10/10/2020 CT. No new focal osseous lesions. Mild lumbar spondylosis. Review of the MIP images confirms the above findings. IMPRESSION: 1. Limited motion degraded scan. No evidence of acute pulmonary embolism. Chronic eccentric small nonocclusive bilateral pulmonary emboli, unchanged. 2. Cavitary posterior right upper lobe lung mass, stable. 3. Moderate  dependent right pleural effusion, largely new. 4. 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 5. Right liver metastases are stable to mildly increased. 6. Moderate colonic stool volume, suggesting constipation. No evidence of bowel obstruction or acute bowel inflammation. 7. Stable ectatic 2.8 cm infrarenal abdominal aorta. Recommend follow-up ultrasound every 5 years. This recommendation follows ACR consensus guidelines: White Paper of the ACR Incidental Findings Committee II on Vascular Findings. J Am Coll Radiol 2013; 10:789-794. 8. Aortic Atherosclerosis (ICD10-I70.0). Electronically Signed   By: Ilona Sorrel M.D.   On: 11/06/2020 11:51   DG Chest Port 1 View  Result Date: 11/06/2020 CLINICAL DATA:  Shortness of breath. EXAM: PORTABLE CHEST 1 VIEW COMPARISON:  January 20, 2020. FINDINGS: The heart size and mediastinal contours are within normal limits. No pneumothorax is noted. Left internal jugular Port-A-Cath is unchanged in position. Left lung is clear. Right upper lobe opacity is enlarged concerning for enlarging mass. Increased right lower lobe opacity is noted concerning for atelectasis with associated effusion. The visualized skeletal structures are unremarkable. IMPRESSION: Right upper lobe opacity is enlarged concerning for enlarging mass. Increased right lower lobe opacity is noted concerning for atelectasis or pneumonia with associated effusion. Electronically Signed   By: Marijo Conception M.D.   On: 11/06/2020 13:07    EKG: Independently reviewed.  Sinus tachycardia on the monitor  Assessment/Plan  Principal Problem Acute hypoxic respiratory failure, new onset pleural effusion -Possibly related to his pleural effusion however he has a significant degree of fluid overload.  Requiring 2 L nasal cannula satting in the upper 80s in the ED and overall feeling comfortable. -Given  significant lower extremity swelling and pleural effusion will place patient on standing IV Lasix and obtain a 2D echo.  Also obtain a BNP.  Most recent 2D echo was done in April 2021 showed normal LV function EF 55-60% without WMA and normal diastolic parameters -Based on how he responds to IV Lasix may not need thoracentesis.  We will do daily weights and monitor I's and O's  Active Problems Stage IV lung cancer, cancer related pain -Patient with multiple metastatic disease and severe excruciating pain to the point that he was barely able to move to get a thoracentesis after 2 mg of Dilaudid. -Continue home fentanyl of 100 MCG, hold home oxycodone and place on IV Dilaudid to obtain some degree of control overnight and that can be spaced out tomorrow and put back on oxycodone -Consulted palliative care as well to assist with pain control as he is on significantly high doses of narcotics  History of PEs -CT scan does show small nonocclusive PEs which are unchanged.  Continue home Eliquis  History of diabetes mellitus -Hold metformin, placed on sliding scale.  Hyperlipidemia -Continue  statin  Narcotic induced constipation -bowel regimen, tailor based on response   DVT prophylaxis: Already on Eliquis Code Status: DNR per patient Family Communication: No family at bedside Disposition Plan: Home versus SNF Bed Type: Telemetry Consults called: None Obs/Inp: Observation    Marzetta Board, MD, PhD [facility]  Contact via [email or web address]  11/06/2020, 5:30 PM

## 2020-11-06 NOTE — ED Provider Notes (Signed)
[facility] DEPT Provider Note   CSN: [id number] Arrival date & time: 11/06/20  0901     History Chief Complaint  Patient presents with  . Abdominal Pain    Mike [name] is a 55 y.o. male.  HPI   55 year old male with past medical history of stage IV squamous cell carcinoma of the lung, HTN, HLD, DM presents the emergency department with left lower abdominal pain.  Patient admits that he has had constipation.  He currently uses fentanyl patches.  A little over a day ago patient states when he was drained to have a bowel movement he had left lower quadrant abdominal pain.  This has become persistent.  Does not radiate into his back or chest.  He has had soft stools but denies any bloody diarrhea.  He has had no fever, cough, chest pain or urinary symptoms.  No history of hernia, no recent surgeries.  Past Medical History:  Diagnosis Date  . Arthritis    neck  . Cancer, metastatic to bone ([place]) dx'd 10/2019  . Diabetes ([place])   . Fatty liver 12/05/2019   By [place] 11/2019  . GERD (gastroesophageal reflux disease)   . 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 High cholesterol   . IBS (irritable bowel syndrome)   . lung ca dx'd 10/2019   lung stage 4   . OSA (obstructive sleep apnea) 09/15/2019   Sleep study 12/2017 - severe OSA with AHI 65.6, desat to 76% rec CPAP  . Severe obesity (BMI 35.0-39.9) with comorbidity ([place]) 09/04/2019    Patient Active Problem List   Diagnosis Date Noted  . Acute pain of left shoulder 10/09/2020  . Leg swelling 10/08/2020  . Cancer related pain 10/08/2020  . Femoral fracture ([place]) 07/24/2020  . Impending pathologic fracture 07/22/2020  . Situational depression 02/22/2020  . UTI due to extended-spectrum beta lactamase (ESBL) producing Escherichia coli 02/10/2020  . Abnormal thyroid function test 02/10/2020  . Port-A-Cath in place 01/29/2020  . Acute pulmonary embolism ([place]) 01/20/2020  . Stage IV squamous cell  carcinoma of right lung ([place]) 12/24/2019  . Goals of care, counseling/discussion 12/24/2019  . Encounter for antineoplastic chemotherapy 12/24/2019  . Encounter for antineoplastic immunotherapy 12/24/2019  . Rib pain on right side 12/12/2019  . Bony metastasis ([place]) 12/12/2019  . Fatty liver 12/05/2019  . Encounter for well adult exam with abnormal findings 11/25/2019  . Chest pain 11/25/2019  . GERD (gastroesophageal reflux disease) 11/25/2019  . Dyslipidemia associated with type 2 diabetes mellitus ([place]) 11/25/2019  . Ex-smoker 09/15/2019  . OSA (obstructive sleep apnea) 09/15/2019  . Severe obesity (BMI 35.0-39.9) with comorbidity ([place]) 09/04/2019  . Erectile dysfunction 10/18/2014  . Type 2 diabetes mellitus with other specified complication ([place]) 60/07/9322    Past Surgical History:  Procedure Laterality Date  . BIOPSY  12/18/2019   Procedure: BIOPSY;  Surgeon: Garner Nash, DO;  Location: [facility];  Service: Pulmonary;;  . BRONCHIAL BRUSHINGS  12/18/2019   Procedure: BRONCHIAL BRUSHINGS;  Surgeon: Garner Nash, DO;  Location: [facility];  Service: Pulmonary;;  . BRONCHIAL WASHINGS  12/18/2019   Procedure: BRONCHIAL WASHINGS;  Surgeon: Garner Nash, DO;  Location: [facility];  Service: Pulmonary;;  . COLONOSCOPY  10/2019   mult TA, int hem, rpt 1 yr ([place])  . CYSTECTOMY     knee- left knee cyst  . ENDOBRONCHIAL ULTRASOUND  12/18/2019   Procedure: ENDOBRONCHIAL ULTRASOUND;  Surgeon: June Leap  L, DO;  Location: [facility];  Service: Pulmonary;;  . FINE NEEDLE ASPIRATION  12/18/2019   Procedure: FINE NEEDLE ASPIRATION;  Surgeon: Garner Nash, DO;  Location: [facility];  Service: Pulmonary;;  . INTRAMEDULLARY (IM) NAIL INTERTROCHANTERIC Left 07/25/2020   Procedure: LEFT INTERTROCHANTERIC INTRAMEDULLARY (IM) NAIL;  Surgeon: Leandrew Koyanagi, MD;  Location: [facility];  Service: Orthopedics;  Laterality: Left;  . IR IMAGING GUIDED PORT INSERTION  12/27/2019  .  PILONIDAL CYST EXCISION    . VIDEO BRONCHOSCOPY WITH ENDOBRONCHIAL ULTRASOUND Right 12/18/2019   Procedure: VIDEO BRONCHOSCOPY;  Surgeon: Garner Nash, DO;  Location: [facility];  Service: Pulmonary;  Laterality: Right;       Family History  Problem Relation Age of Onset  . CAD Mother        stents  . Stroke Neg Hx   . Diabetes Neg Hx   . Cancer Neg Hx   . Colon cancer Neg Hx   . Esophageal cancer Neg Hx   . Stomach cancer Neg Hx   . Rectal cancer Neg Hx     Social History   Tobacco Use  . Smoking status: Current Every Day Smoker    Packs/day: 3.00    Years: 40.00    Pack years: 120.00    Last attempt to quit: 01/18/2020    Years since quitting: 0.8  . Smokeless tobacco: Never Used  . Tobacco comment: Currently smoking about 5 cigs a day  Vaping Use  . Vaping Use: Never used  Substance Use Topics  . Alcohol use: Yes    Comment: Rarely  . Drug use: Never    Home Medications Prior to Admission medications   Medication Sig Start Date End Date Taking? Authorizing Provider  albuterol (PROVENTIL) (2.5 MG/3ML) 0.083% nebulizer solution Take 3 mLs (2.5 mg total) by nebulization every 6 (six) hours as needed for wheezing or shortness of breath. 01/09/20   Icard, Leory Plowman L, DO  atorvastatin (LIPITOR) 20 MG tablet TAKE 1/2 TABLET BY MOUTH DAILY 10/27/20   [name], MD  dexamethasone (DECADRON) 4 MG tablet Take 2 tablets TWICE a day the day before, the day of, and the day after chemotherapy. 09/25/20   [name], [name], PA-C  diazepam (VALIUM) 5 MG tablet Take 1 tablet (5 mg total) by mouth every 12 (twelve) hours as needed for anxiety or muscle spasms. 09/25/20   [name], MD  docusate sodium (COLACE) 100 MG capsule Take 1 capsule (100 mg total) by mouth 2 (two) times daily. 07/28/20   Cherene Altes, MD  ELIQUIS 5 MG TABS tablet TAKE 1 TABLET BY MOUTH TWICE A DAY 09/19/20   [name], MD  fentaNYL (DURAGESIC) 100 MCG/HR Place 1 patch onto the  skin every 3 (three) days. 11/03/20   [name], [name], PA-C  furosemide (LASIX) 20 MG tablet Take 1 tablet (20 mg total) by mouth daily. 08/21/20   Gery Pray, MD  magic mouthwash SOLN Take 5 mLs by mouth 4 (four) times daily. 10/08/20   [name], [name], PA-C  metFORMIN (GLUCOPHAGE) 1000 MG tablet Take 1 tablet (1,000 mg total) by mouth 2 (two) times daily with a meal. 09/08/20   [name], MD  methocarbamol (ROBAXIN) 500 MG tablet Take 1 tablet (500 mg total) by mouth every 6 (six) hours as needed for muscle spasms. 10/09/20   Leandrew Koyanagi, MD  oxyCODONE (OXY IR/ROXICODONE) 5 MG immediate release tablet 1 to 2 PO Q 4 hours prn pain 10/28/20  [name], MD  oxyCODONE-acetaminophen (PERCOCET) 5-325 MG tablet Take 1-2 tablets by mouth 2 (two) times daily as needed for severe pain. 10/30/20   Leandrew Koyanagi, MD  sildenafil (REVATIO) 20 MG tablet Take 1-5 tablets (20-100 mg total) by mouth daily as needed (ED). Patient taking differently: Take 40 mg by mouth daily as needed (ED). 06/08/20   [name], MD    Allergies    Patient has no known allergies.  Review of Systems   Review of Systems  Constitutional: Positive for fatigue. Negative for chills and fever.  HENT: Negative for congestion.   Eyes: Negative for visual disturbance.  Respiratory: Negative for shortness of breath.   Cardiovascular: Negative for chest pain, palpitations and leg swelling.  Gastrointestinal: Positive for abdominal pain and constipation. Negative for abdominal distention, anal bleeding, blood in stool, diarrhea and vomiting.  Genitourinary: Negative for dysuria and hematuria.  Musculoskeletal: Positive for back pain.  Skin: Negative for rash.  Neurological: Negative for headaches.    Physical Exam Updated Vital Signs BP 120/80 (BP Location: Right Arm)   Pulse 100   Temp 98.3 F (36.8 C) (Oral)   Resp (!) 21   SpO2 90%   Physical Exam Vitals and nursing note  reviewed.  Constitutional:      General: He is not in acute distress.    Appearance: Normal appearance.  HENT:     Head: Normocephalic.     Mouth/Throat:     Mouth: Mucous membranes are moist.  Cardiovascular:     Rate and Rhythm: Normal rate.  Pulmonary:     Effort: Pulmonary effort is normal. No respiratory distress.  Abdominal:     Palpations: Abdomen is soft.     Tenderness: There is abdominal tenderness in the left lower quadrant. There is guarding. There is no rebound.     Hernia: No hernia is present.  Skin:    General: Skin is warm.  Neurological:     Mental Status: He is alert and oriented to person, place, and time. Mental status is at baseline.  Psychiatric:        Mood and Affect: Mood normal.     ED Results / Procedures / Treatments   Labs (all labs ordered are listed, but only abnormal results are displayed) Labs Reviewed  CBC WITH DIFFERENTIAL/PLATELET  COMPREHENSIVE METABOLIC PANEL  URINALYSIS, ROUTINE W REFLEX MICROSCOPIC  LIPASE, BLOOD    EKG None  Radiology No results found.  Procedures Procedures (including critical care time)  Medications Ordered in ED Medications  sodium chloride 0.9 % bolus 500 mL (has no administration in time range)  HYDROmorphone (DILAUDID) injection 1 mg (has no administration in time range)    ED Course  I have reviewed the triage vital signs and the nursing notes.  Pertinent labs & imaging results that were available during my care of the patient were reviewed by me and considered in my medical decision making (see chart for details).    MDM Rules/Calculators/A&P                          65-year-old male presents the emergency department with left-sided abdominal/flank and back pain.  Patient has a fentanyl patch in place.  Left-sided the abdomen is tender to palpation, no distention, no palpable hernia.  Blood work shows a baseline anemia, CT a PE study and of the abdomen shows no acute finding.  There are  chronic subsegmental pulmonary emboli with no change,  abdomen shows constipation without any hernia/obstruction or vascular acute finding.  Patient does have noted pathologic compression fracture of T11, this may be the source of the patient's discomfort.  There is noted new right-sided moderate pleural effusion.  This explains the patient's hypoxia.  He has been treated symptomatically for pain control with IV medication.  Spoke with Dr. Vernard Gambles on-call interventional radiology and they are planning for thoracentesis.  Plan to reevaluate the patient following this procedure.  Currently he has stable on the 2 L of nasal cannula, still complaining of left flank/back pain.  Unfortunately due to the patient's pain level they were unable to appropriately position the patient and gets the thoracentesis procedure completed.  Patient continues to be hypoxic on room air with uncontrolled acute on chronic pain.  For this reason he will be admitted for pain control and repeat procedure.  Patients evaluation and results requires admission for further treatment and care. Patient agrees with admission plan, offers no new complaints and is stable/unchanged at time of admit.  Final Clinical Impression(s) / ED Diagnoses Final diagnoses:  None    Rx / DC Orders ED Discharge Orders    None       Lorelle Gibbs, DO 11/06/20 1708

## 2020-11-06 NOTE — ED Triage Notes (Addendum)
Pt BIB EMS. Pt c/o left abd pain that has increased within the last two to three days. Hx of stage 4 cancer. Pt does have fent patch on; per EMS a new one was placed on before leaving pts home. Pt also complaining of diarrhea for the last few days.    VS WNL O2-97% on 4L [place] (EMS stated on arrival pt O2 was 88% RA; pt was placed on oxygen)

## 2020-11-06 NOTE — ED Notes (Signed)
Pt. CBG 591, RN, Felicia made aware.

## 2020-11-07 ENCOUNTER — Inpatient Hospital Stay (HOSPITAL_COMMUNITY): Payer: 59

## 2020-11-07 ENCOUNTER — Observation Stay (HOSPITAL_COMMUNITY): Payer: 59

## 2020-11-07 DIAGNOSIS — C787 Secondary malignant neoplasm of liver and intrahepatic bile duct: Secondary | ICD-10-CM | POA: Diagnosis present

## 2020-11-07 DIAGNOSIS — R0600 Dyspnea, unspecified: Secondary | ICD-10-CM | POA: Diagnosis not present

## 2020-11-07 DIAGNOSIS — R5381 Other malaise: Secondary | ICD-10-CM | POA: Diagnosis present

## 2020-11-07 DIAGNOSIS — E669 Obesity, unspecified: Secondary | ICD-10-CM | POA: Diagnosis present

## 2020-11-07 DIAGNOSIS — Z20822 Contact with and (suspected) exposure to covid-19: Secondary | ICD-10-CM | POA: Diagnosis present

## 2020-11-07 DIAGNOSIS — C7951 Secondary malignant neoplasm of bone: Secondary | ICD-10-CM | POA: Diagnosis present

## 2020-11-07 DIAGNOSIS — I5033 Acute on chronic diastolic (congestive) heart failure: Secondary | ICD-10-CM | POA: Diagnosis present

## 2020-11-07 DIAGNOSIS — C3491 Malignant neoplasm of unspecified part of right bronchus or lung: Secondary | ICD-10-CM | POA: Diagnosis present

## 2020-11-07 DIAGNOSIS — J9601 Acute respiratory failure with hypoxia: Secondary | ICD-10-CM | POA: Diagnosis present

## 2020-11-07 DIAGNOSIS — T40605A Adverse effect of unspecified narcotics, initial encounter: Secondary | ICD-10-CM | POA: Diagnosis present

## 2020-11-07 DIAGNOSIS — I5030 Unspecified diastolic (congestive) heart failure: Secondary | ICD-10-CM | POA: Diagnosis not present

## 2020-11-07 DIAGNOSIS — K5903 Drug induced constipation: Secondary | ICD-10-CM | POA: Diagnosis present

## 2020-11-07 DIAGNOSIS — R0689 Other abnormalities of breathing: Secondary | ICD-10-CM | POA: Diagnosis present

## 2020-11-07 DIAGNOSIS — J90 Pleural effusion, not elsewhere classified: Secondary | ICD-10-CM | POA: Diagnosis present

## 2020-11-07 DIAGNOSIS — Z7901 Long term (current) use of anticoagulants: Secondary | ICD-10-CM | POA: Diagnosis not present

## 2020-11-07 DIAGNOSIS — I110 Hypertensive heart disease with heart failure: Secondary | ICD-10-CM | POA: Diagnosis present

## 2020-11-07 DIAGNOSIS — I2782 Chronic pulmonary embolism: Secondary | ICD-10-CM | POA: Diagnosis present

## 2020-11-07 DIAGNOSIS — G893 Neoplasm related pain (acute) (chronic): Secondary | ICD-10-CM | POA: Diagnosis not present

## 2020-11-07 DIAGNOSIS — Z7984 Long term (current) use of oral hypoglycemic drugs: Secondary | ICD-10-CM | POA: Diagnosis not present

## 2020-11-07 DIAGNOSIS — J918 Pleural effusion in other conditions classified elsewhere: Secondary | ICD-10-CM | POA: Diagnosis present

## 2020-11-07 DIAGNOSIS — L89151 Pressure ulcer of sacral region, stage 1: Secondary | ICD-10-CM | POA: Diagnosis present

## 2020-11-07 DIAGNOSIS — Z8249 Family history of ischemic heart disease and other diseases of the circulatory system: Secondary | ICD-10-CM | POA: Diagnosis not present

## 2020-11-07 DIAGNOSIS — D638 Anemia in other chronic diseases classified elsewhere: Secondary | ICD-10-CM | POA: Diagnosis present

## 2020-11-07 DIAGNOSIS — Z6832 Body mass index (BMI) 32.0-32.9, adult: Secondary | ICD-10-CM | POA: Diagnosis not present

## 2020-11-07 DIAGNOSIS — E119 Type 2 diabetes mellitus without complications: Secondary | ICD-10-CM | POA: Diagnosis present

## 2020-11-07 DIAGNOSIS — L89311 Pressure ulcer of right buttock, stage 1: Secondary | ICD-10-CM | POA: Diagnosis not present

## 2020-11-07 DIAGNOSIS — R532 Functional quadriplegia: Secondary | ICD-10-CM | POA: Diagnosis present

## 2020-11-07 DIAGNOSIS — J910 Malignant pleural effusion: Secondary | ICD-10-CM | POA: Diagnosis present

## 2020-11-07 DIAGNOSIS — Z66 Do not resuscitate: Secondary | ICD-10-CM | POA: Diagnosis present

## 2020-11-07 LAB — BODY FLUID CELL COUNT WITH DIFFERENTIAL
Eos, Fluid: 0 %
Lymphs, Fluid: 14 %
Monocyte-Macrophage-Serous Fluid: 65 % (ref 50–90)
Neutrophil Count, Fluid: 21 % (ref 0–25)
Total Nucleated Cell Count, Fluid: 607 cu mm (ref 0–1000)

## 2020-11-07 LAB — GLUCOSE, PLEURAL OR PERITONEAL FLUID: Glucose, Fluid: 101 mg/dL

## 2020-11-07 LAB — CBC
HCT: 31.5 % — ABNORMAL LOW (ref 39.0–52.0)
Hemoglobin: 9.7 g/dL — ABNORMAL LOW (ref 13.0–17.0)
MCH: 29.2 pg (ref 26.0–34.0)
MCHC: 30.8 g/dL (ref 30.0–36.0)
MCV: 94.9 fL (ref 80.0–100.0)
Platelets: 207 10*3/uL (ref 150–400)
RBC: 3.32 MIL/uL — ABNORMAL LOW (ref 4.22–5.81)
RDW: 21.5 % — ABNORMAL HIGH (ref 11.5–15.5)
WBC: 12.5 10*3/uL — ABNORMAL HIGH (ref 4.0–10.5)
nRBC: 0 % (ref 0.0–0.2)

## 2020-11-07 LAB — COMPREHENSIVE METABOLIC PANEL
ALT: 12 U/L (ref 0–44)
AST: 17 U/L (ref 15–41)
Albumin: 2.5 g/dL — ABNORMAL LOW (ref 3.5–5.0)
Alkaline Phosphatase: 98 U/L (ref 38–126)
Anion gap: 12 (ref 5–15)
BUN: 5 mg/dL — ABNORMAL LOW (ref 6–20)
CO2: 24 mmol/L (ref 22–32)
Calcium: 8.1 mg/dL — ABNORMAL LOW (ref 8.9–10.3)
Chloride: 101 mmol/L (ref 98–111)
Creatinine, Ser: 0.49 mg/dL — ABNORMAL LOW (ref 0.61–1.24)
GFR, Estimated: >60 mL/min (ref >60)
Glucose, Bld: 97 mg/dL (ref 70–99)
Potassium: 4.2 mmol/L (ref 3.5–5.1)
Sodium: 137 mmol/L (ref 135–145)
Total Bilirubin: 0.5 mg/dL (ref 0.3–1.2)
Total Protein: 5.9 g/dL — ABNORMAL LOW (ref 6.5–8.1)

## 2020-11-07 LAB — GLUCOSE, CAPILLARY
Glucose-Capillary: 80 mg/dL (ref 70–99)
Glucose-Capillary: 107 mg/dL — ABNORMAL HIGH (ref 70–99)
Glucose-Capillary: 90 mg/dL (ref 70–99)
Glucose-Capillary: 109 mg/dL — ABNORMAL HIGH (ref 70–99)

## 2020-11-07 LAB — HEMOGLOBIN A1C
Hgb A1c MFr Bld: 5.7 % — ABNORMAL HIGH (ref 4.8–5.6)
Mean Plasma Glucose: 116.89 mg/dL

## 2020-11-07 LAB — ECHOCARDIOGRAM COMPLETE
Area-P 1/2: 7.66 cm2
S' Lateral: 2.9 cm

## 2020-11-07 LAB — LACTATE DEHYDROGENASE, PLEURAL OR PERITONEAL FLUID: LD, Fluid: 238 U/L — ABNORMAL HIGH (ref 3–23)

## 2020-11-07 MED ORDER — CHLORHEXIDINE GLUCONATE CLOTH 2 % EX PADS
6.0000 | MEDICATED_PAD | Freq: Every day | CUTANEOUS | Status: DC
  Administered 2020-11-07 – 2020-11-08 (×2): 6 via TOPICAL

## 2020-11-07 MED ORDER — LIDOCAINE HCL 1 % IJ SOLN
INTRAMUSCULAR | Status: AC
  Filled 2020-11-07: qty 20

## 2020-11-07 NOTE — ED Notes (Signed)
Pts CBG-80mg /dL, Unable to recognize pt arm band

## 2020-11-07 NOTE — Progress Notes (Signed)
PT Cancellation Note  Patient Details Name: Mike [name] MRN: [id number] DOB: 09-18-1966   Cancelled Treatment:    Reason Eval/Treat Not Completed: Pain limiting ability to participate;Medical issues which prohibited therapy, for possible re-attempt thoracentesis today. Will check back another time.   Claretha Cooper 11/07/2020, 11:31 AM [place] Pager [phone] Office [phone]

## 2020-11-07 NOTE — Progress Notes (Signed)
°  Echocardiogram 2D Echocardiogram has been performed.  Mike [name] 11/07/2020, 8:44 AM

## 2020-11-07 NOTE — ED Notes (Signed)
ECHO at bedside.

## 2020-11-07 NOTE — Progress Notes (Signed)
OT Cancellation Note  Patient Details Name: [name] MRN: [id number] DOB: [birth year]   Cancelled Treatment:    Reason Eval/Treat Not Completed: Pain limiting ability to participate. Patient having significant back pain currently poorly controlled, unable to tolerate thoracentesis 1/20 with pain meds on board. Will check back as schedule permits.  Delbert Phenix OT OT pager: [phone]   Rosemary Holms 11/07/2020, 8:48 AM

## 2020-11-07 NOTE — Procedures (Signed)
Ultrasound-guided diagnostic and therapeutic right thoracentesis performed yielding 1.1 liters of yellow fluid. No immediate complications. Follow-up chest x-ray pending. The fluid was sent to the lab for preordered studies. EBL< 1cc.

## 2020-11-07 NOTE — ED Notes (Signed)
ED TO INPATIENT HANDOFF REPORT  ED Nurse Name and Phone #: Donella Stade [phone]  S Name/Age/Gender Mike [name] 55 y.o. male Room/Bed: WA05/WA05  Code Status   Code Status: DNR  Home/SNF/Other Home Patient oriented to: self, place, time and situation Is this baseline? Yes   Triage Complete: Triage complete  Chief Complaint Pleural effusion [J90]  Triage Note Pt BIB EMS. Pt c/o left abd pain that has increased within the last two to three days. Hx of stage 4 cancer. Pt does have fent patch on; per EMS a new one was placed on before leaving pts home. Pt also complaining of diarrhea for the last few days.    VS WNL O2-97% on 4L [place] (EMS stated on arrival pt O2 was 88% RA; pt was placed on oxygen)     Allergies No Known Allergies  Level of Care/Admitting Diagnosis ED Disposition    ED Disposition Condition Comment   Admit  Hospital Area: [facility] [[id number]]  Level of Care: Telemetry [5]  Admit to tele based on following criteria: Monitor QTC interval  Covid Evaluation: Confirmed COVID Negative  Diagnosis: Pleural effusion [[id number]]  Admitting Physician: Caren Griffins [[id number]]  Attending Physician: Caren Griffins [phone]       B Medical/Surgery History Past Medical History:  Diagnosis Date  . Arthritis    neck  . Cancer, metastatic to bone ([place]) dx'd 10/2019  . Diabetes ([place])   . Fatty liver 12/05/2019   By [place] 11/2019  . GERD (gastroesophageal reflux disease)   . 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 High cholesterol   . IBS (irritable bowel syndrome)   . lung ca dx'd 10/2019   lung stage 4   . OSA (obstructive sleep apnea) 09/15/2019   Sleep study 12/2017 - severe OSA with AHI 65.6, desat to 76% rec CPAP  . Severe obesity (BMI 35.0-39.9) with comorbidity ([place]) 09/04/2019   Past Surgical History:  Procedure Laterality Date  . BIOPSY  12/18/2019   Procedure: BIOPSY;  Surgeon: Garner Nash, DO;  Location: [facility];   Service: Pulmonary;;  . BRONCHIAL BRUSHINGS  12/18/2019   Procedure: BRONCHIAL BRUSHINGS;  Surgeon: Garner Nash, DO;  Location: [facility];  Service: Pulmonary;;  . BRONCHIAL WASHINGS  12/18/2019   Procedure: BRONCHIAL WASHINGS;  Surgeon: Garner Nash, DO;  Location: [facility];  Service: Pulmonary;;  . COLONOSCOPY  10/2019   mult TA, int hem, rpt 1 yr ([place])  . CYSTECTOMY     knee- left knee cyst  . ENDOBRONCHIAL ULTRASOUND  12/18/2019   Procedure: ENDOBRONCHIAL ULTRASOUND;  Surgeon: Garner Nash, DO;  Location: [facility];  Service: Pulmonary;;  . FINE NEEDLE ASPIRATION  12/18/2019   Procedure: FINE NEEDLE ASPIRATION;  Surgeon: Garner Nash, DO;  Location: [facility];  Service: Pulmonary;;  . INTRAMEDULLARY (IM) NAIL INTERTROCHANTERIC Left 07/25/2020   Procedure: LEFT INTERTROCHANTERIC INTRAMEDULLARY (IM) NAIL;  Surgeon: Leandrew Koyanagi, MD;  Location: [facility];  Service: Orthopedics;  Laterality: Left;  . IR IMAGING GUIDED PORT INSERTION  12/27/2019  . PILONIDAL CYST EXCISION    . VIDEO BRONCHOSCOPY WITH ENDOBRONCHIAL ULTRASOUND Right 12/18/2019   Procedure: VIDEO BRONCHOSCOPY;  Surgeon: Garner Nash, DO;  Location: [facility];  Service: Pulmonary;  Laterality: Right;     A IV Location/Drains/Wounds Patient Lines/Drains/Airways Status    Active Line/Drains/Airways    Name Placement date Placement time Site Days   Implanted Port 12/27/19 Left Chest  12/27/19  1540  Chest  316   Incision (Closed) 07/25/20 Hip Left 07/25/20  1342  -- 105          Intake/Output Last 24 hours  Intake/Output Summary (Last 24 hours) at 11/07/2020 0834 Last data filed at 11/06/2020 2255 Gross per 24 hour  Intake 3 ml  Output --  Net 3 ml    Labs/Imaging Results for orders placed or performed during the hospital encounter of 11/06/20 (from the past 48 hour(s))  CBC with Differential     Status: Abnormal   Collection Time: 11/06/20 10:09 AM  Result Value Ref Range   WBC 11.8  (H) 4.0 - 10.5 K/uL   RBC 3.32 (L) 4.22 - 5.81 MIL/uL   Hemoglobin 9.6 (L) 13.0 - 17.0 g/dL   HCT 31.6 (L) 39.0 - 52.0 %   MCV 95.2 80.0 - 100.0 fL   MCH 28.9 26.0 - 34.0 pg   MCHC 30.4 30.0 - 36.0 g/dL   RDW 21.7 (H) 11.5 - 15.5 %   Platelets 212 150 - 400 K/uL   nRBC 0.2 0.0 - 0.2 %   Neutrophils Relative % 82 %   Neutro Abs 9.6 (H) 1.7 - 7.7 K/uL   Lymphocytes Relative 9 %   Lymphs Abs 1.1 0.7 - 4.0 K/uL   Monocytes Relative 7 %   Monocytes Absolute 0.9 0.1 - 1.0 K/uL   Eosinophils Relative 0 %   Eosinophils Absolute 0.0 0.0 - 0.5 K/uL   Basophils Relative 0 %   Basophils Absolute 0.0 0.0 - 0.1 K/uL   Immature Granulocytes 2 %   Abs Immature Granulocytes 0.22 (H) 0.00 - 0.07 K/uL   Polychromasia PRESENT     Comment: Performed at [facility], [place] [address] [place], [place] [address]  Comprehensive metabolic panel     Status: Abnormal   Collection Time: 11/06/20 10:09 AM  Result Value Ref Range   Sodium 137 135 - 145 mmol/L   Potassium 4.3 3.5 - 5.1 mmol/L   Chloride 101 98 - 111 mmol/L   CO2 25 22 - 32 mmol/L   Glucose, Bld 93 70 - 99 mg/dL    Comment: Glucose reference range applies only to samples taken after fasting for at least 8 hours.   BUN 7 6 - 20 mg/dL   Creatinine, Ser 0.57 (L) 0.61 - 1.24 mg/dL   Calcium 8.3 (L) 8.9 - 10.3 mg/dL   Total Protein 6.2 (L) 6.5 - 8.1 g/dL   Albumin 2.6 (L) 3.5 - 5.0 g/dL   AST 17 15 - 41 U/L   ALT 11 0 - 44 U/L   Alkaline Phosphatase 105 38 - 126 U/L   Total Bilirubin 0.2 (L) 0.3 - 1.2 mg/dL   GFR, Estimated >60 >60 mL/min    Comment: (NOTE) Calculated using the CKD-EPI Creatinine Equation (2021)    Anion gap 11 5 - 15    Comment: Performed at [facility], [place] [address], [place], [place] [address]  Lipase, blood     Status: None   Collection Time: 11/06/20 10:09 AM  Result Value Ref Range   Lipase 19 11 - 51 U/L    Comment: Performed at [facility], [place]  [address], [place], [place] [address]  POC SARS Coronavirus 2 Ag-ED - Nasal Swab     Status: None   Collection Time: 11/06/20  3:44 PM  Result Value Ref Range   SARS Coronavirus 2 Ag NEGATIVE NEGATIVE  Comment: (NOTE) SARS-CoV-2 antigen NOT DETECTED.   Negative results are presumptive.  Negative results do not preclude SARS-CoV-2 infection and should not be used as the sole basis for treatment or other patient management decisions, including infection  control decisions, particularly in the presence of clinical signs and  symptoms consistent with COVID-19, or in those who have been in contact with the virus.  Negative results must be combined with clinical observations, patient history, and epidemiological information. The expected result is Negative.  Fact Sheet for Patients: HandmadeRecipes.com.cy  Fact Sheet for Healthcare Providers: FuneralLife.at  This test is not yet approved or cleared by the [place] FDA and  has been authorized for detection and/or diagnosis of SARS-CoV-2 by FDA under an Emergency Use Authorization (EUA).  This EUA will remain in effect (meaning this test can be used) for the duration of  the COV ID-19 declaration under Section 564(b)(1) of the Act, 21 U.S.C. section 360bbb-3(b)(1), unless the authorization is terminated or revoked sooner.    Urinalysis, Routine w reflex microscopic     Status: None   Collection Time: 11/06/20  4:40 PM  Result Value Ref Range   Color, Urine YELLOW YELLOW   APPearance CLEAR CLEAR   Specific Gravity, Urine 1.028 1.005 - 1.030   pH 6.0 5.0 - 8.0   Glucose, UA NEGATIVE NEGATIVE mg/dL   Hgb urine dipstick NEGATIVE NEGATIVE   Bilirubin Urine NEGATIVE NEGATIVE   Ketones, ur NEGATIVE NEGATIVE mg/dL   Protein, ur NEGATIVE NEGATIVE mg/dL   Nitrite NEGATIVE NEGATIVE   Leukocytes,Ua NEGATIVE NEGATIVE    Comment: Performed at [facility] [address] [place], [place] [address]  Brain natriuretic peptide     Status: None   Collection Time: 11/06/20  6:45 PM  Result Value Ref Range   B Natriuretic Peptide 55.0 0.0 - 100.0 pg/mL    Comment: Performed at [facility], [place] [address], [place], [place] [address]  CBG monitoring, ED     Status: Abnormal   Collection Time: 11/06/20  9:57 PM  Result Value Ref Range   Glucose-Capillary 104 (H) 70 - 99 mg/dL    Comment: Glucose reference range applies only to samples taken after fasting for at least 8 hours.   Comment 1 Notify RN   Hemoglobin A1c     Status: Abnormal   Collection Time: 11/07/20  3:53 AM  Result Value Ref Range   Hgb A1c MFr Bld 5.7 (H) 4.8 - 5.6 %    Comment: (NOTE) Pre diabetes:          5.7%-6.4%  Diabetes:              >6.4%  Glycemic control for   <7.0% adults with diabetes    Mean Plasma Glucose 116.89 mg/dL    Comment: Performed at [facility] [address] [place], [place] [address]  Comprehensive metabolic panel     Status: Abnormal   Collection Time: 11/07/20  3:54 AM  Result Value Ref Range   Sodium 137 135 - 145 mmol/L   Potassium 4.2 3.5 - 5.1 mmol/L   Chloride 101 98 - 111 mmol/L   CO2 24 22 - 32 mmol/L   Glucose, Bld 97 70 - 99 mg/dL    Comment: Glucose reference range applies only to samples taken after fasting for at least 8 hours.   BUN 5 (L) 6 - 20 mg/dL   Creatinine, Ser 0.49 (L) 0.61 - 1.24 mg/dL  Calcium 8.1 (L) 8.9 - 10.3 mg/dL   Total Protein 5.9 (L) 6.5 - 8.1 g/dL   Albumin 2.5 (L) 3.5 - 5.0 g/dL   AST 17 15 - 41 U/L   ALT 12 0 - 44 U/L   Alkaline Phosphatase 98 38 - 126 U/L   Total Bilirubin 0.5 0.3 - 1.2 mg/dL   GFR, Estimated >60 >60 mL/min    Comment: (NOTE) Calculated using the CKD-EPI Creatinine Equation (2021)    Anion gap 12 5 - 15    Comment: Performed at [facility], [place] [address] [place], [place] [address]  CBC     Status: Abnormal   Collection Time: 11/07/20  3:54 AM   Result Value Ref Range   WBC 12.5 (H) 4.0 - 10.5 K/uL   RBC 3.32 (L) 4.22 - 5.81 MIL/uL   Hemoglobin 9.7 (L) 13.0 - 17.0 g/dL   HCT 31.5 (L) 39.0 - 52.0 %   MCV 94.9 80.0 - 100.0 fL   MCH 29.2 26.0 - 34.0 pg   MCHC 30.8 30.0 - 36.0 g/dL   RDW 21.5 (H) 11.5 - 15.5 %   Platelets 207 150 - 400 K/uL   nRBC 0.0 0.0 - 0.2 %    Comment: Performed at [facility], [place] [address] [place], [place] [address]   CT Angio Chest PE W/Cm &/Or Wo Cm  Result Date: 11/06/2020 CLINICAL DATA:  Stage IV right lung cancer. History of pulmonary embolism. Worsening left abdominal pain and diarrhea for few days. Hypoxia. EXAM: CT ANGIOGRAPHY CHEST CT ABDOMEN AND PELVIS WITH CONTRAST TECHNIQUE: Multidetector CT imaging of the chest was performed using the standard protocol during bolus administration of intravenous contrast. Multiplanar CT image reconstructions and MIPs were obtained to evaluate the vascular anatomy. Multidetector CT imaging of the abdomen and pelvis was performed using the standard protocol during bolus administration of intravenous contrast. CONTRAST:  156mL OMNIPAQUE IOHEXOL 350 MG/ML SOLN COMPARISON:  10/10/2020 chest CT angiogram and CT abdomen/pelvis. FINDINGS: CTA CHEST FINDINGS Cardiovascular: The study is low to moderate quality for the evaluation of pulmonary embolism, with significant motion degradation. Chronic eccentric small nonocclusive bilateral pulmonary emboli in the peripheral right pulmonary artery (series 5/image 132) and segmental left lower lobe pulmonary artery branches (series 5/image 133), unchanged. Atherosclerotic nonaneurysmal thoracic aorta. Top-normal caliber main pulmonary artery (3.3 cm diameter). Normal heart size. Stable trace pericardial effusion/thickening. Left internal jugular Port-A-Cath terminates at the cavoatrial junction. Mediastinum/Nodes: No discrete thyroid nodules. Unremarkable esophagus. Mildly enlarged 1.0 cm left supraclavicular node  (series 4/image 11), stable. No axillary adenopathy. Mildly enlarged 1.0 cm right hilar node (series 4/image 43), stable. No left hilar adenopathy. Mildly enlarged 1.0 cm AP window node (series 4/image 32), stable. Lungs/Pleura: No pneumothorax. Moderate dependent right pleural effusion largely new. No left pleural effusion. Cavitary irregular 4.5 x 2.8 cm posterior right upper lobe lung mass (series 6/image 49), previously 4.5 x 2.7 cm using similar measurement technique, stable. Passive atelectasis in the dependent right lower lobe. No new significant pulmonary nodules. Musculoskeletal: Expansile lytic lesion in the medial left clavicle measures 5.4 cm (series 6/image 19), increased from 4.7 cm on 10/10/2020 CT. Lytic 2.2 cm left humeral head lesion (series 6/image 6), increased from 1.8 cm. Stable lytic sternal lesion. Stable lytic expansile 5.6 cm right T11 posterior elements lesion (series 6/image 131) with stable mild superior T11 vertebral compression fracture. No new focal osseous lesions in the chest. Moderate thoracic spondylosis. Mild gynecomastia asymmetric to the right,  stable. Review of the MIP images confirms the above findings. CT ABDOMEN and PELVIS FINDINGS Hepatobiliary: Three scattered hypodense right liver masses, stable to mildly increased. Segment 7 right liver 1.3 cm mass (series 1/image 15), increased from 0.8 cm. Inferior right liver 4.1 cm mass adjacent to the gallbladder (series 1/image 28), slightly increased from 3.8 cm. Far inferior right liver 3.3 cm mass (series 1/image 37), stable. Normal gallbladder with no radiopaque cholelithiasis. No biliary ductal dilatation. Pancreas: Normal, with no mass or duct dilation. Spleen: Normal size. No mass. Adrenals/Urinary Tract: Normal adrenals. Normal kidneys with no hydronephrosis and no renal mass. Normal bladder. Stomach/Bowel: Normal non-distended stomach. Normal caliber small bowel with no small bowel wall thickening. Normal appendix.  Moderate colonic stool. No large bowel wall thickening, diverticulosis or significant pericolonic fat stranding. Mild presacral space fat stranding and ill-defined fluid is not significantly changed and is nonspecific. Vascular/Lymphatic: Atherosclerotic abdominal aorta with ectatic 2.8 cm infrarenal abdominal aorta, stable. Patent portal, splenic, hepatic and renal veins. No pathologically enlarged lymph nodes in the abdomen or pelvis. Reproductive: Normal size prostate. Other: No pneumoperitoneum, ascites or focal fluid collection. Musculoskeletal: Numerous lytic lesions throughout the pelvis including right upper and lower left sacrum, medial left iliac bone, right iliac wing and posterior right acetabulum, not appreciably changed since 10/10/2020 CT. No new focal osseous lesions. Mild lumbar spondylosis. Review of the MIP images confirms the above findings. IMPRESSION: 1. Limited motion degraded scan. No evidence of acute pulmonary embolism. Chronic eccentric small nonocclusive bilateral pulmonary emboli, unchanged. 2. Cavitary posterior right upper lobe lung mass, stable. 3. Moderate dependent right pleural effusion, largely new. 4. 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 5. Right liver metastases are stable to mildly increased. 6. Moderate colonic stool volume, suggesting constipation. No evidence of bowel obstruction or acute bowel inflammation. 7. Stable ectatic 2.8 cm infrarenal abdominal aorta. Recommend follow-up ultrasound every 5 years. This recommendation follows ACR consensus guidelines: White Paper of the ACR Incidental Findings Committee II on Vascular Findings. J Am Coll Radiol 2013; 10:789-794. 8. Aortic Atherosclerosis (ICD10-I70.0). Electronically Signed   By: Ilona Sorrel M.D.   On: 11/06/2020 11:51   CT Abdomen Pelvis W Contrast  Result Date: 11/06/2020 CLINICAL DATA:  Stage IV  right lung cancer. History of pulmonary embolism. Worsening left abdominal pain and diarrhea for few days. Hypoxia. EXAM: CT ANGIOGRAPHY CHEST CT ABDOMEN AND PELVIS WITH CONTRAST TECHNIQUE: Multidetector CT imaging of the chest was performed using the standard protocol during bolus administration of intravenous contrast. Multiplanar CT image reconstructions and MIPs were obtained to evaluate the vascular anatomy. Multidetector CT imaging of the abdomen and pelvis was performed using the standard protocol during bolus administration of intravenous contrast. CONTRAST:  145mL OMNIPAQUE IOHEXOL 350 MG/ML SOLN COMPARISON:  10/10/2020 chest CT angiogram and CT abdomen/pelvis. FINDINGS: CTA CHEST FINDINGS Cardiovascular: The study is low to moderate quality for the evaluation of pulmonary embolism, with significant motion degradation. Chronic eccentric small nonocclusive bilateral pulmonary emboli in the peripheral right pulmonary artery (series 5/image 132) and segmental left lower lobe pulmonary artery branches (series 5/image 133), unchanged. Atherosclerotic nonaneurysmal thoracic aorta. Top-normal caliber main pulmonary artery (3.3 cm diameter). Normal heart size. Stable trace pericardial effusion/thickening. Left internal jugular Port-A-Cath terminates at the cavoatrial junction. Mediastinum/Nodes: No discrete thyroid nodules. Unremarkable esophagus. Mildly enlarged 1.0 cm left supraclavicular node (series 4/image 11), stable. No axillary adenopathy. Mildly enlarged 1.0 cm right hilar node (  series 4/image 43), stable. No left hilar adenopathy. Mildly enlarged 1.0 cm AP window node (series 4/image 32), stable. Lungs/Pleura: No pneumothorax. Moderate dependent right pleural effusion largely new. No left pleural effusion. Cavitary irregular 4.5 x 2.8 cm posterior right upper lobe lung mass (series 6/image 49), previously 4.5 x 2.7 cm using similar measurement technique, stable. Passive atelectasis in the dependent right  lower lobe. No new significant pulmonary nodules. Musculoskeletal: Expansile lytic lesion in the medial left clavicle measures 5.4 cm (series 6/image 19), increased from 4.7 cm on 10/10/2020 CT. Lytic 2.2 cm left humeral head lesion (series 6/image 6), increased from 1.8 cm. Stable lytic sternal lesion. Stable lytic expansile 5.6 cm right T11 posterior elements lesion (series 6/image 131) with stable mild superior T11 vertebral compression fracture. No new focal osseous lesions in the chest. Moderate thoracic spondylosis. Mild gynecomastia asymmetric to the right, stable. Review of the MIP images confirms the above findings. CT ABDOMEN and PELVIS FINDINGS Hepatobiliary: Three scattered hypodense right liver masses, stable to mildly increased. Segment 7 right liver 1.3 cm mass (series 1/image 15), increased from 0.8 cm. Inferior right liver 4.1 cm mass adjacent to the gallbladder (series 1/image 28), slightly increased from 3.8 cm. Far inferior right liver 3.3 cm mass (series 1/image 37), stable. Normal gallbladder with no radiopaque cholelithiasis. No biliary ductal dilatation. Pancreas: Normal, with no mass or duct dilation. Spleen: Normal size. No mass. Adrenals/Urinary Tract: Normal adrenals. Normal kidneys with no hydronephrosis and no renal mass. Normal bladder. Stomach/Bowel: Normal non-distended stomach. Normal caliber small bowel with no small bowel wall thickening. Normal appendix. Moderate colonic stool. No large bowel wall thickening, diverticulosis or significant pericolonic fat stranding. Mild presacral space fat stranding and ill-defined fluid is not significantly changed and is nonspecific. Vascular/Lymphatic: Atherosclerotic abdominal aorta with ectatic 2.8 cm infrarenal abdominal aorta, stable. Patent portal, splenic, hepatic and renal veins. No pathologically enlarged lymph nodes in the abdomen or pelvis. Reproductive: Normal size prostate. Other: No pneumoperitoneum, ascites or focal fluid  collection. Musculoskeletal: Numerous lytic lesions throughout the pelvis including right upper and lower left sacrum, medial left iliac bone, right iliac wing and posterior right acetabulum, not appreciably changed since 10/10/2020 CT. No new focal osseous lesions. Mild lumbar spondylosis. Review of the MIP images confirms the above findings. IMPRESSION: 1. Limited motion degraded scan. No evidence of acute pulmonary embolism. Chronic eccentric small nonocclusive bilateral pulmonary emboli, unchanged. 2. Cavitary posterior right upper lobe lung mass, stable. 3. Moderate dependent right pleural effusion, largely new. 4. 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 5. Right liver metastases are stable to mildly increased. 6. Moderate colonic stool volume, suggesting constipation. No evidence of bowel obstruction or acute bowel inflammation. 7. Stable ectatic 2.8 cm infrarenal abdominal aorta. Recommend follow-up ultrasound every 5 years. This recommendation follows ACR consensus guidelines: White Paper of the ACR Incidental Findings Committee II on Vascular Findings. J Am Coll Radiol 2013; 10:789-794. 8. Aortic Atherosclerosis (ICD10-I70.0). Electronically Signed   By: Ilona Sorrel M.D.   On: 11/06/2020 11:51   DG Chest Port 1 View  Result Date: 11/06/2020 CLINICAL DATA:  Shortness of breath. EXAM: PORTABLE CHEST 1 VIEW COMPARISON:  January 20, 2020. FINDINGS: The heart size and mediastinal contours are within normal limits. No pneumothorax is noted. Left internal jugular Port-A-Cath is unchanged in position. Left lung is clear. Right upper lobe opacity is enlarged concerning for enlarging mass. Increased right lower lobe opacity  is noted concerning for atelectasis with associated effusion. The visualized skeletal structures are unremarkable. IMPRESSION: Right upper lobe opacity is enlarged concerning for  enlarging mass. Increased right lower lobe opacity is noted concerning for atelectasis or pneumonia with associated effusion. Electronically Signed   By: Marijo Conception M.D.   On: 11/06/2020 13:07    Pending Labs Unresulted Labs (From admission, onward)          Start     Ordered   11/06/20 1348  SARS CORONAVIRUS 2 (TAT 6-24 HRS) Nasopharyngeal Nasopharyngeal Swab  (Tier 3 - Symptomatic/asymptomatic with Precautions)  Once,   STAT       Question Answer Comment  Is this test for diagnosis or screening Screening   Symptomatic for COVID-19 as defined by CDC No   Hospitalized for COVID-19 No   Admitted to ICU for COVID-19 No   Previously tested for COVID-19 Yes   Resident in a congregate (group) care setting No   Employed in healthcare setting No   Has patient completed COVID vaccination(s) (2 doses of Pfizer/Moderna 1 dose of [employer]) Unknown      11/06/20 1348          Vitals/Pain Today's Vitals   11/07/20 0600 11/07/20 0630 11/07/20 0700 11/07/20 0730  BP: (!) 135/94 136/88 (!) 147/89 (!) 138/91  Pulse: 97 95 (!) 101 96  Resp: (!) 27 (!) 23 16 15   Temp:      TempSrc:      SpO2: 95% 94% 95% 94%  PainSc:        Isolation Precautions Airborne and Contact precautions  Medications Medications  atorvastatin (LIPITOR) tablet 10 mg (10 mg Oral Given 11/06/20 1849)  diazepam (VALIUM) tablet 5 mg (has no administration in time range)  apixaban (ELIQUIS) tablet 5 mg (5 mg Oral Given 11/06/20 2253)  methocarbamol (ROBAXIN) tablet 500 mg (has no administration in time range)  albuterol (PROVENTIL) (2.5 MG/3ML) 0.083% nebulizer solution 2.5 mg (has no administration in time range)  loratadine (CLARITIN) tablet 10 mg (10 mg Oral Patient Refused/Not Given 11/06/20 1727)  sodium chloride flush (NS) 0.9 % injection 3 mL (3 mLs Intravenous Given 11/06/20 2255)  sodium chloride flush (NS) 0.9 % injection 3 mL (has no administration in time range)  0.9 %  sodium chloride infusion  (has no administration in time range)  ondansetron (ZOFRAN) tablet 4 mg (has no administration in time range)    Or  ondansetron (ZOFRAN) injection 4 mg (has no administration in time range)  magnesium citrate solution 1 Bottle (has no administration in time range)  insulin aspart (novoLOG) injection 0-9 Units (has no administration in time range)  HYDROmorphone (DILAUDID) injection 1 mg (1 mg Intravenous Given 11/07/20 0357)  furosemide (LASIX) injection 40 mg (40 mg Intravenous Given 11/06/20 1849)  senna-docusate (Senokot-S) tablet 2 tablet (2 tablets Oral Given 11/06/20 2253)  polyethylene glycol (MIRALAX / GLYCOLAX) packet 17 g (17 g Oral Patient Refused/Not Given 11/06/20 1851)  fentaNYL (DURAGESIC) 100 MCG/HR 1 patch (has no administration in time range)  sodium chloride 0.9 % bolus 500 mL (0 mLs Intravenous Stopped 11/06/20 1237)  HYDROmorphone (DILAUDID) injection 1 mg (1 mg Intravenous Given 11/06/20 1019)  iohexol (OMNIPAQUE) 350 MG/ML injection 100 mL (100 mLs Intravenous Contrast Given 11/06/20 1052)  HYDROmorphone (DILAUDID) injection 1 mg (1 mg Intravenous Given 11/06/20 1235)  HYDROmorphone (DILAUDID) injection 1 mg (1 mg Intravenous Given 11/06/20 1723)    Mobility walks with person assist Moderate fall risk  Focused Assessments Neuro Assessment Handoff:  Swallow screen pass? Yes          Neuro Assessment:   Neuro Checks:      Last Documented NIHSS Modified Score:   Has TPA been given? No If patient is a Neuro Trauma and patient is going to OR before floor call report to [facility] nurse: [phone] or [phone]  , Pulmonary Assessment Handoff:  Lung sounds:   O2 Device: Room Air        R Recommendations: See Admitting Provider Note  Report given to:   Additional Notes: .

## 2020-11-07 NOTE — Progress Notes (Signed)
PROGRESS NOTE    Mike [name]  KGU:[id number] DOB: [birth year] DOA: 11/06/2020 PCP: [name], MD   Brief Narrative:  Mike [name] is a 55 y.o. male with medical history significant of stage IV non-small cell lung cancer, squamous cell carcinoma, followed by Dr. Earlie Server currently on palliative systemic chemotherapy with docetaxel and Cyramza, obesity, history of PEs,hyperlipidemia, diabetes mellitus, presents to the hospital with complaints of increasing back pain over the last several days, as well as shortness of breath.  Patient was admitted with acute hypoxemic respiratory failure in the setting of new onset pleural effusion and volume overload.  2D echocardiogram report pending.  He continues to have significant amounts of low back pain related to metastatic disease as well as mild, stable T11 compression fracture.  PT evaluation pending.  Currently remains on 2 L nasal cannula oxygen.  Thoracentesis attempted on 1/20, but could not be performed due to patient discomfort, will be reattempted today.  Assessment & Plan:   Active Problems:   Pleural effusion   Acute hypoxic respiratory failure, new onset pleural effusion -Possibly related to his pleural effusion however he has a significant degree of fluid overload.  Requiring 2 L nasal cannula satting in the upper 80s in the ED and overall feeling comfortable. -Given significant lower extremity swelling and pleural effusion will place patient on standing IV Lasix and obtain a 2D echo.  Also obtain a BNP.  Most recent 2D echo was done in April 2021 showed normal LV function EF 55-60% without WMA and normal diastolic parameters. Echo is still pending. -Based on how he responds to IV Lasix may not need thoracentesis.  We will do daily weights and monitor I's and O's; Continue IV lasix as ordered. -Urine outputs have been inaccurate due to late placement of pure wick and urination all over the bed overnight.  Stage IV lung cancer,  cancer related pain -Patient with multiple metastatic disease and severe excruciating pain to the point that he was barely able to move to get a thoracentesis after 2 mg of Dilaudid. -Continue home fentanyl of 100 MCG, hold home oxycodone and place on IV Dilaudid to obtain some degree of control overnight and that can be spaced out tomorrow and put back on oxycodone -Consulted palliative care as well to assist with pain control as he is on significantly high doses of narcotics\ -PT evaluation ordered and pending  History of PEs -CT scan does show small nonocclusive PEs which are unchanged.  Continue home Eliquis  History of diabetes mellitus -Hold metformin, placed on sliding scale. -Currently with stable blood pressure readings  Hyperlipidemia -Continue statin  Narcotic induced constipation -bowel regimen, tailor based on response   DVT prophylaxis:Eliquis Code Status: Full Family Communication: Pt will call; none at bedside Disposition Plan:  Status is: Observation  The patient will require care spanning > 2 midnights and should be moved to inpatient because: IV treatments appropriate due to intensity of illness or inability to take PO  Dispo: The patient is from: Home              Anticipated d/c is to: Home              Anticipated d/c date is: 1 day              Patient currently is not medically stable to d/c.  Patient is still volume overloaded and requires IV Lasix for diuresis.  He is still requiring 2 L nasal cannula oxygen and has  shortness of breath with movement.  Thoracentesis to be reattempted.  PT evaluation pending.   Nutritional Assessment:  The patient's BMI is: Body mass index is 32.86 kg/m.[name]     Consultants:   IR for thoracentesis  Procedures:   See below  Antimicrobials:   None   Subjective: Patient seen and evaluated today with ongoing low back pain noted.  He is quite uncomfortable in his bed.  Additionally, he continues to have some  minimal shortness of breath particularly with movement.  Objective: Vitals:   11/07/20 0700 11/07/20 0730 11/07/20 0906 11/07/20 0915  BP: (!) 147/89 (!) 138/91 135/89   Pulse: (!) 101 96 (!) 101   Resp: 16 15 20    Temp:   98.4 F (36.9 C)   TempSrc:   Oral   SpO2: 95% 94% 98%   Weight:    109.9 kg  Height:    6' (1.829 m)    Intake/Output Summary (Last 24 hours) at 11/07/2020 0933 Last data filed at 11/07/2020 0925 Gross per 24 hour  Intake 3 ml  Output 450 ml  Net -447 ml   Filed Weights   11/07/20 0915  Weight: 109.9 kg    Examination:  General exam: Appears calm and comfortable  Respiratory system: Diminished to auscultation bilaterally. Respiratory effort normal.  Currently on 2 L nasal cannula oxygen. Cardiovascular system: S1 & S2 heard, RRR.  Gastrointestinal system: Abdomen is soft Central nervous system: Alert and awake Extremities: Bilateral 1-2+ pitting edema to midshin. Skin: No significant lesions noted Psychiatry: Flat affect.    Data Reviewed: I have personally reviewed following labs and imaging studies  CBC: Recent Labs  Lab 11/06/20 1009 11/07/20 0354  WBC 11.8* 12.5*  NEUTROABS 9.6*  --   HGB 9.6* 9.7*  HCT 31.6* 31.5*  MCV 95.2 94.9  PLT 212 500   Basic Metabolic Panel: Recent Labs  Lab 11/06/20 1009 11/07/20 0354  NA 137 137  K 4.3 4.2  CL 101 101  CO2 25 24  GLUCOSE 93 97  BUN 7 5*  CREATININE 0.57* 0.49*  CALCIUM 8.3* 8.1*   GFR: Estimated Creatinine Clearance: 132 mL/min (A) (by C-G formula based on SCr of 0.49 mg/dL (L)). Liver Function Tests: Recent Labs  Lab 11/06/20 1009 11/07/20 0354  AST 17 17  ALT 11 12  ALKPHOS 105 98  BILITOT 0.2* 0.5  PROT 6.2* 5.9*  ALBUMIN 2.6* 2.5*   Recent Labs  Lab 11/06/20 1009  LIPASE 19   No results for input(s): AMMONIA in the last 168 hours. Coagulation Profile: No results for input(s): INR, PROTIME in the last 168 hours. Cardiac Enzymes: No results for input(s):  CKTOTAL, CKMB, CKMBINDEX, TROPONINI in the last 168 hours. BNP (last 3 results) No results for input(s): PROBNP in the last 8760 hours. HbA1C: Recent Labs    11/07/20 0353  HGBA1C 5.7*   CBG: Recent Labs  Lab 11/06/20 2157  GLUCAP 104*   Lipid Profile: No results for input(s): CHOL, HDL, LDLCALC, TRIG, CHOLHDL, LDLDIRECT in the last 72 hours. Thyroid Function Tests: No results for input(s): TSH, T4TOTAL, FREET4, T3FREE, THYROIDAB in the last 72 hours. Anemia Panel: No results for input(s): VITAMINB12, FOLATE, FERRITIN, TIBC, IRON, RETICCTPCT in the last 72 hours. Sepsis Labs: No results for input(s): PROCALCITON, LATICACIDVEN in the last 168 hours.  No results found for this or any previous visit (from the past 240 hour(s)).       Radiology Studies: CT Angio Chest PE W/Cm &/Or Wo  Cm  Result Date: 11/06/2020 CLINICAL DATA:  Stage IV right lung cancer. History of pulmonary embolism. Worsening left abdominal pain and diarrhea for few days. Hypoxia. EXAM: CT ANGIOGRAPHY CHEST CT ABDOMEN AND PELVIS WITH CONTRAST TECHNIQUE: Multidetector CT imaging of the chest was performed using the standard protocol during bolus administration of intravenous contrast. Multiplanar CT image reconstructions and MIPs were obtained to evaluate the vascular anatomy. Multidetector CT imaging of the abdomen and pelvis was performed using the standard protocol during bolus administration of intravenous contrast. CONTRAST:  121mL OMNIPAQUE IOHEXOL 350 MG/ML SOLN COMPARISON:  10/10/2020 chest CT angiogram and CT abdomen/pelvis. FINDINGS: CTA CHEST FINDINGS Cardiovascular: The study is low to moderate quality for the evaluation of pulmonary embolism, with significant motion degradation. Chronic eccentric small nonocclusive bilateral pulmonary emboli in the peripheral right pulmonary artery (series 5/image 132) and segmental left lower lobe pulmonary artery branches (series 5/image 133), unchanged. Atherosclerotic  nonaneurysmal thoracic aorta. Top-normal caliber main pulmonary artery (3.3 cm diameter). Normal heart size. Stable trace pericardial effusion/thickening. Left internal jugular Port-A-Cath terminates at the cavoatrial junction. Mediastinum/Nodes: No discrete thyroid nodules. Unremarkable esophagus. Mildly enlarged 1.0 cm left supraclavicular node (series 4/image 11), stable. No axillary adenopathy. Mildly enlarged 1.0 cm right hilar node (series 4/image 43), stable. No left hilar adenopathy. Mildly enlarged 1.0 cm AP window node (series 4/image 32), stable. Lungs/Pleura: No pneumothorax. Moderate dependent right pleural effusion largely new. No left pleural effusion. Cavitary irregular 4.5 x 2.8 cm posterior right upper lobe lung mass (series 6/image 49), previously 4.5 x 2.7 cm using similar measurement technique, stable. Passive atelectasis in the dependent right lower lobe. No new significant pulmonary nodules. Musculoskeletal: Expansile lytic lesion in the medial left clavicle measures 5.4 cm (series 6/image 19), increased from 4.7 cm on 10/10/2020 CT. Lytic 2.2 cm left humeral head lesion (series 6/image 6), increased from 1.8 cm. Stable lytic sternal lesion. Stable lytic expansile 5.6 cm right T11 posterior elements lesion (series 6/image 131) with stable mild superior T11 vertebral compression fracture. No new focal osseous lesions in the chest. Moderate thoracic spondylosis. Mild gynecomastia asymmetric to the right, stable. Review of the MIP images confirms the above findings. CT ABDOMEN and PELVIS FINDINGS Hepatobiliary: Three scattered hypodense right liver masses, stable to mildly increased. Segment 7 right liver 1.3 cm mass (series 1/image 15), increased from 0.8 cm. Inferior right liver 4.1 cm mass adjacent to the gallbladder (series 1/image 28), slightly increased from 3.8 cm. Far inferior right liver 3.3 cm mass (series 1/image 37), stable. Normal gallbladder with no radiopaque cholelithiasis. No  biliary ductal dilatation. Pancreas: Normal, with no mass or duct dilation. Spleen: Normal size. No mass. Adrenals/Urinary Tract: Normal adrenals. Normal kidneys with no hydronephrosis and no renal mass. Normal bladder. Stomach/Bowel: Normal non-distended stomach. Normal caliber small bowel with no small bowel wall thickening. Normal appendix. Moderate colonic stool. No large bowel wall thickening, diverticulosis or significant pericolonic fat stranding. Mild presacral space fat stranding and ill-defined fluid is not significantly changed and is nonspecific. Vascular/Lymphatic: Atherosclerotic abdominal aorta with ectatic 2.8 cm infrarenal abdominal aorta, stable. Patent portal, splenic, hepatic and renal veins. No pathologically enlarged lymph nodes in the abdomen or pelvis. Reproductive: Normal size prostate. Other: No pneumoperitoneum, ascites or focal fluid collection. Musculoskeletal: Numerous lytic lesions throughout the pelvis including right upper and lower left sacrum, medial left iliac bone, right iliac wing and posterior right acetabulum, not appreciably changed since 10/10/2020 CT. No new focal osseous lesions. Mild lumbar spondylosis. Review of the MIP images  confirms the above findings. IMPRESSION: 1. Limited motion degraded scan. No evidence of acute pulmonary embolism. Chronic eccentric small nonocclusive bilateral pulmonary emboli, unchanged. 2. Cavitary posterior right upper lobe lung mass, stable. 3. Moderate dependent right pleural effusion, largely new. 4. 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 5. Right liver metastases are stable to mildly increased. 6. Moderate colonic stool volume, suggesting constipation. No evidence of bowel obstruction or acute bowel inflammation. 7. Stable ectatic 2.8 cm infrarenal abdominal aorta. Recommend follow-up ultrasound every 5 years. This  recommendation follows ACR consensus guidelines: White Paper of the ACR Incidental Findings Committee II on Vascular Findings. J Am Coll Radiol 2013; 10:789-794. 8. Aortic Atherosclerosis (ICD10-I70.0). Electronically Signed   By: Ilona Sorrel M.D.   On: 11/06/2020 11:51   CT Abdomen Pelvis W Contrast  Result Date: 11/06/2020 CLINICAL DATA:  Stage IV right lung cancer. History of pulmonary embolism. Worsening left abdominal pain and diarrhea for few days. Hypoxia. EXAM: CT ANGIOGRAPHY CHEST CT ABDOMEN AND PELVIS WITH CONTRAST TECHNIQUE: Multidetector CT imaging of the chest was performed using the standard protocol during bolus administration of intravenous contrast. Multiplanar CT image reconstructions and MIPs were obtained to evaluate the vascular anatomy. Multidetector CT imaging of the abdomen and pelvis was performed using the standard protocol during bolus administration of intravenous contrast. CONTRAST:  130mL OMNIPAQUE IOHEXOL 350 MG/ML SOLN COMPARISON:  10/10/2020 chest CT angiogram and CT abdomen/pelvis. FINDINGS: CTA CHEST FINDINGS Cardiovascular: The study is low to moderate quality for the evaluation of pulmonary embolism, with significant motion degradation. Chronic eccentric small nonocclusive bilateral pulmonary emboli in the peripheral right pulmonary artery (series 5/image 132) and segmental left lower lobe pulmonary artery branches (series 5/image 133), unchanged. Atherosclerotic nonaneurysmal thoracic aorta. Top-normal caliber main pulmonary artery (3.3 cm diameter). Normal heart size. Stable trace pericardial effusion/thickening. Left internal jugular Port-A-Cath terminates at the cavoatrial junction. Mediastinum/Nodes: No discrete thyroid nodules. Unremarkable esophagus. Mildly enlarged 1.0 cm left supraclavicular node (series 4/image 11), stable. No axillary adenopathy. Mildly enlarged 1.0 cm right hilar node (series 4/image 43), stable. No left hilar adenopathy. Mildly enlarged 1.0 cm  AP window node (series 4/image 32), stable. Lungs/Pleura: No pneumothorax. Moderate dependent right pleural effusion largely new. No left pleural effusion. Cavitary irregular 4.5 x 2.8 cm posterior right upper lobe lung mass (series 6/image 49), previously 4.5 x 2.7 cm using similar measurement technique, stable. Passive atelectasis in the dependent right lower lobe. No new significant pulmonary nodules. Musculoskeletal: Expansile lytic lesion in the medial left clavicle measures 5.4 cm (series 6/image 19), increased from 4.7 cm on 10/10/2020 CT. Lytic 2.2 cm left humeral head lesion (series 6/image 6), increased from 1.8 cm. Stable lytic sternal lesion. Stable lytic expansile 5.6 cm right T11 posterior elements lesion (series 6/image 131) with stable mild superior T11 vertebral compression fracture. No new focal osseous lesions in the chest. Moderate thoracic spondylosis. Mild gynecomastia asymmetric to the right, stable. Review of the MIP images confirms the above findings. CT ABDOMEN and PELVIS FINDINGS Hepatobiliary: Three scattered hypodense right liver masses, stable to mildly increased. Segment 7 right liver 1.3 cm mass (series 1/image 15), increased from 0.8 cm. Inferior right liver 4.1 cm mass adjacent to the gallbladder (series 1/image 28), slightly increased from 3.8 cm. Far inferior right liver 3.3 cm mass (series 1/image 37), stable. Normal gallbladder with no radiopaque cholelithiasis. No biliary ductal dilatation. Pancreas: Normal, with no mass or duct dilation. Spleen:  Normal size. No mass. Adrenals/Urinary Tract: Normal adrenals. Normal kidneys with no hydronephrosis and no renal mass. Normal bladder. Stomach/Bowel: Normal non-distended stomach. Normal caliber small bowel with no small bowel wall thickening. Normal appendix. Moderate colonic stool. No large bowel wall thickening, diverticulosis or significant pericolonic fat stranding. Mild presacral space fat stranding and ill-defined fluid is not  significantly changed and is nonspecific. Vascular/Lymphatic: Atherosclerotic abdominal aorta with ectatic 2.8 cm infrarenal abdominal aorta, stable. Patent portal, splenic, hepatic and renal veins. No pathologically enlarged lymph nodes in the abdomen or pelvis. Reproductive: Normal size prostate. Other: No pneumoperitoneum, ascites or focal fluid collection. Musculoskeletal: Numerous lytic lesions throughout the pelvis including right upper and lower left sacrum, medial left iliac bone, right iliac wing and posterior right acetabulum, not appreciably changed since 10/10/2020 CT. No new focal osseous lesions. Mild lumbar spondylosis. Review of the MIP images confirms the above findings. IMPRESSION: 1. Limited motion degraded scan. No evidence of acute pulmonary embolism. Chronic eccentric small nonocclusive bilateral pulmonary emboli, unchanged. 2. Cavitary posterior right upper lobe lung mass, stable. 3. Moderate dependent right pleural effusion, largely new. 4. 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 5. Right liver metastases are stable to mildly increased. 6. Moderate colonic stool volume, suggesting constipation. No evidence of bowel obstruction or acute bowel inflammation. 7. Stable ectatic 2.8 cm infrarenal abdominal aorta. Recommend follow-up ultrasound every 5 years. This recommendation follows ACR consensus guidelines: White Paper of the ACR Incidental Findings Committee II on Vascular Findings. J Am Coll Radiol 2013; 10:789-794. 8. Aortic Atherosclerosis (ICD10-I70.0). Electronically Signed   By: Ilona Sorrel M.D.   On: 11/06/2020 11:51   DG Chest Port 1 View  Result Date: 11/06/2020 CLINICAL DATA:  Shortness of breath. EXAM: PORTABLE CHEST 1 VIEW COMPARISON:  January 20, 2020. FINDINGS: The heart size and mediastinal contours are within normal limits. No pneumothorax is noted. Left internal  jugular Port-A-Cath is unchanged in position. Left lung is clear. Right upper lobe opacity is enlarged concerning for enlarging mass. Increased right lower lobe opacity is noted concerning for atelectasis with associated effusion. The visualized skeletal structures are unremarkable. IMPRESSION: Right upper lobe opacity is enlarged concerning for enlarging mass. Increased right lower lobe opacity is noted concerning for atelectasis or pneumonia with associated effusion. Electronically Signed   By: Marijo Conception M.D.   On: 11/06/2020 13:07     Scheduled Meds: . apixaban  5 mg Oral BID  . atorvastatin  10 mg Oral Daily  . Chlorhexidine Gluconate Cloth  6 each Topical Daily  . [START ON 11/09/2020] fentaNYL  1 patch Transdermal Q72H  . furosemide  40 mg Intravenous BID  . insulin aspart  0-9 Units Subcutaneous TID WC  . loratadine  10 mg Oral Daily  . polyethylene glycol  17 g Oral Daily  . senna-docusate  2 tablet Oral BID  . sodium chloride flush  3 mL Intravenous Q12H   Continuous Infusions: . sodium chloride       LOS: 0 days    Time spent: 35 minutes    Giulia Hickey Darleen Crocker, DO [facility]  If 7PM-7AM, please contact night-coverage [email or web address] 11/07/2020, 9:33 AM

## 2020-11-08 DIAGNOSIS — I2782 Chronic pulmonary embolism: Secondary | ICD-10-CM

## 2020-11-08 DIAGNOSIS — R0600 Dyspnea, unspecified: Secondary | ICD-10-CM

## 2020-11-08 DIAGNOSIS — J90 Pleural effusion, not elsewhere classified: Secondary | ICD-10-CM

## 2020-11-08 DIAGNOSIS — J9601 Acute respiratory failure with hypoxia: Secondary | ICD-10-CM

## 2020-11-08 DIAGNOSIS — R5381 Other malaise: Secondary | ICD-10-CM

## 2020-11-08 DIAGNOSIS — R0689 Other abnormalities of breathing: Secondary | ICD-10-CM

## 2020-11-08 DIAGNOSIS — C3491 Malignant neoplasm of unspecified part of right bronchus or lung: Principal | ICD-10-CM

## 2020-11-08 DIAGNOSIS — G893 Neoplasm related pain (acute) (chronic): Secondary | ICD-10-CM

## 2020-11-08 DIAGNOSIS — L8990 Pressure ulcer of unspecified site, unspecified stage: Secondary | ICD-10-CM | POA: Insufficient documentation

## 2020-11-08 DIAGNOSIS — I5033 Acute on chronic diastolic (congestive) heart failure: Secondary | ICD-10-CM

## 2020-11-08 DIAGNOSIS — L89311 Pressure ulcer of right buttock, stage 1: Secondary | ICD-10-CM

## 2020-11-08 LAB — CBC
HCT: 32.7 % — ABNORMAL LOW (ref 39.0–52.0)
Hemoglobin: 10.2 g/dL — ABNORMAL LOW (ref 13.0–17.0)
MCH: 29.2 pg (ref 26.0–34.0)
MCHC: 31.2 g/dL (ref 30.0–36.0)
MCV: 93.7 fL (ref 80.0–100.0)
Platelets: 241 10*3/uL (ref 150–400)
RBC: 3.49 MIL/uL — ABNORMAL LOW (ref 4.22–5.81)
RDW: 21.9 % — ABNORMAL HIGH (ref 11.5–15.5)
WBC: 11 10*3/uL — ABNORMAL HIGH (ref 4.0–10.5)
nRBC: 0 % (ref 0.0–0.2)

## 2020-11-08 LAB — BASIC METABOLIC PANEL
Anion gap: 11 (ref 5–15)
BUN: 8 mg/dL (ref 6–20)
CO2: 31 mmol/L (ref 22–32)
Calcium: 8.5 mg/dL — ABNORMAL LOW (ref 8.9–10.3)
Chloride: 95 mmol/L — ABNORMAL LOW (ref 98–111)
Creatinine, Ser: 0.57 mg/dL — ABNORMAL LOW (ref 0.61–1.24)
GFR, Estimated: >60 mL/min (ref >60)
Glucose, Bld: 115 mg/dL — ABNORMAL HIGH (ref 70–99)
Potassium: 3.5 mmol/L (ref 3.5–5.1)
Sodium: 137 mmol/L (ref 135–145)

## 2020-11-08 LAB — GLUCOSE, CAPILLARY
Glucose-Capillary: 96 mg/dL (ref 70–99)
Glucose-Capillary: 104 mg/dL — ABNORMAL HIGH (ref 70–99)

## 2020-11-08 LAB — MAGNESIUM: Magnesium: 2 mg/dL (ref 1.7–2.4)

## 2020-11-08 LAB — LACTATE DEHYDROGENASE: LDH: 166 U/L (ref 98–192)

## 2020-11-08 MED ORDER — SODIUM CHLORIDE 0.9% FLUSH: 10.0000 mL | INTRAVENOUS | Status: DC | PRN

## 2020-11-08 MED ORDER — FUROSEMIDE 20 MG PO TABS: ORAL_TABLET | ORAL | 0 refills | Status: AC

## 2020-11-08 MED ORDER — OXYCODONE-ACETAMINOPHEN 10-325 MG PO TABS: 1.0000 | ORAL_TABLET | Freq: Four times a day (QID) | ORAL | 0 refills | Status: DC | PRN

## 2020-11-08 MED ORDER — SENNOSIDES-DOCUSATE SODIUM 8.6-50 MG PO TABS: 2.0000 | ORAL_TABLET | Freq: Two times a day (BID) | ORAL | Status: AC | PRN

## 2020-11-08 MED ORDER — HEPARIN SOD (PORK) LOCK FLUSH 100 UNIT/ML IV SOLN
500.0000 [IU] | INTRAVENOUS | Status: AC | PRN
  Administered 2020-11-08: 500 [IU]

## 2020-11-08 MED ORDER — FENTANYL 100 MCG/HR TD PT72: 1.0000 | MEDICATED_PATCH | TRANSDERMAL | 0 refills | Status: AC

## 2020-11-08 NOTE — Discharge Summary (Signed)
Physician Discharge Summary  Mike [name] IDP:[id number] DOB: 10-11-1966 DOA: 11/06/2020  PCP: [name], MD  Admit date: 11/06/2020 Discharge date: 11/08/2020  Admitted From: Home Disposition: Home  Recommendations for Outpatient Follow-up:  1. Follow ups as below. 2. Please obtain CBC/BMP/Mag at follow up 3. Please follow up on the following pending results: None  Home Health: PT/OT Equipment/Devices: Shower chair with back and arm, 2 L oxygen  Discharge Condition: Stable CODE STATUS: DNR/DNI   Follow-up Information    [name], MD. Schedule an appointment as soon as possible for a visit in 1 week(s).   Specialty: Family Medicine Contact information: [place] [address] [phone]        Kate Sable, MD. Schedule an appointment as soon as possible for a visit in 2 week(s).   Specialties: Cardiology, Radiology Contact information: [place] [address] [phone]               Hospital Course: 55 year old male with history of stage IV lung cancer, squamous cell carcinoma, PE, DM-2, debility/functional quadriplegia, chronic back pain, HTN and obesity presenting with LLQ pain, shortness of breath and back pain.  CMP without significant finding.  CBC with mild leukocytosis to 12.5 and Hgb to 9.7.  Urinalysis negative.  He had a CTA chest/abdomen/pelvis that revealed nonocclusive bilateral PE, cavitary RUL mass, moderate right pleural effusion, widespread lytic osseous metastasis, right liver metastasis and moderate colonic stool volume suggestive for constipation.  He was a started on bowel regimen for constipation.  He had thoracocentesis with removal of 1.1 L exudative and culture negative fluid from right lung.  He was also diuresed with IV Lasix with improvement in his breathing.  He was discharged on p.o. Lasix 40 mg daily for 1 week followed by 20 mg daily.  TTE without significant  finding.  Evaluated by therapy who recommended home health and shower chair.  He already has hospital bed, wheelchair and walker.  He also desaturated to 87% on room air and discharged on 2 L by [place].  See individual problem list below for more hospital course.  Discharge Diagnoses:  Acute hypoxemic respiratory failure-likely due to new pleural effusion, lung cancer, diastolic CHF and chronic PE.  Heart thoracocentesis.  Also diuresed with IV Lasix.  -Discharged on 2 L by nasal cannula  New onset right-sided pleural effusion-likely due to malignancy and CHF. S/p thoracocentesis with removal of 1.1 L exudative fluid by L[facility].  Culture negative. -Discharged on p.o. Lasix 40 mg daily for 1 week followed by 20 mg daily.  Acute on chronic diastolic CHF-Echo with normal LVEF but G1 DD.  Diuresed with IV Lasix.  About 1.6 L UOP/24 hours.  Net -1.2 L.  Renal functions and electrolytes stable. -Lasix as above.  LLQ pain-seems to be within abdominal wall based on patient's description.  Could be related to constipation as well.  CT did not show any significant intra-abdominal in that area. -Pain medication as below.  Stage IV lung cancer with multiorgan metastasis mainly to axial bones Cancer related pain -Renewed his prescription for fentanyl and Percocet for 3 to 5 days.  Reviewed narcotic database. -Encouraged to reach out to his prescribers -Daily Colace with as needed Senokot-S to avoid constipation.  Anemia of chronic disease: H&H stable. -Recheck CBC in 1 week or at follow-up  Chronic bilateral PE-residual small nonocclusive bilateral PE noted on CTA chest. -Continue home Eliquis  Narcotic induced constipation-resolved with bowel regimen. -Added  Senokot-S as above  Controlled DM-2 -Continue home medications  Debility/physical deconditioning- -Home health PT/OT -Shower chair with arms and back -Patient has hospital bed, wheelchair and walker at baseline.  Class I obesity Body mass  index is 32.86 kg/m.        Stage I sacral pressure ulcer: POA Pressure Injury 11/07/20 Sacrum Mid Stage 1 -  Intact skin with non-blanchable redness of a localized area usually over a bony prominence. (Active)  11/07/20 1000  Location: Sacrum  Location Orientation: Mid  Staging: Stage 1 -  Intact skin with non-blanchable redness of a localized area usually over a bony prominence.  Wound Description (Comments):   Present on Admission: Yes    Discharge Exam: Vitals:   11/07/20 2120 11/08/20 0628  BP: 110/67 121/79  Pulse: (!) 103 98  Resp: 18 15  Temp: 98.2 F (36.8 C) 98.5 F (36.9 C)  SpO2: 94% 97%    GENERAL: No apparent distress.  Nontoxic. HEENT: MMM.  Vision and hearing grossly intact.  NECK: Supple.  No apparent JVD.  RESP: On 2 L by [place].  No IWOB.  Fair aeration bilaterally. CVS:  RRR. Heart sounds normal.  ABD/GI/GU: Bowel sounds present. Soft.  Mild LLQ tenderness with deep palpation.  No rebound or guarding. MSK/EXT:  Moves extremities. No apparent deformity. No edema.  SKIN: no apparent skin lesion or wound NEURO: Awake, alert and oriented appropriately.  No apparent focal neuro deficit other than global weakness. PSYCH: Calm. Normal affect.   Discharge Instructions  Discharge Instructions    Call MD for:  difficulty breathing, headache or visual disturbances   Complete by: As directed    Call MD for:  severe uncontrolled pain   Complete by: As directed    Diet - low sodium heart healthy   Complete by: As directed    Diet Carb Modified   Complete by: As directed    Discharge instructions   Complete by: As directed    It has been a pleasure taking care of you!  You were admitted to the hospital due to abdominal pain that seems to have resolved.  We did not have great explanation for your pain based on the test is we have done other than some constipation.  However, your CT scan showed pleural effusion (fluid around your right lung) that was drained.  We  are discharging you on your home medication.  Please review your new medication list and the directions on your medications before you take them.  Follow-up with your primary care doctor and oncologist in 1 to 2 weeks.   Take care,   Discharge wound care:   Complete by: As directed    Monitoring, frequent turning and offloading of lower back   Increase activity slowly   Complete by: As directed      Allergies as of 11/08/2020   No Known Allergies     Medication List    TAKE these medications   albuterol (2.5 MG/3ML) 0.083% nebulizer solution Commonly known as: PROVENTIL Take 3 mLs (2.5 mg total) by nebulization every 6 (six) hours as needed for wheezing or shortness of breath.   atorvastatin 20 MG tablet Commonly known as: LIPITOR TAKE 1/2 TABLET BY MOUTH DAILY   dexamethasone 4 MG tablet Commonly known as: DECADRON Take 2 tablets TWICE a day the day before, the day of, and the day after chemotherapy.   diazepam 5 MG tablet Commonly known as: VALIUM Take 1 tablet (5 mg total) by mouth every  12 (twelve) hours as needed for anxiety or muscle spasms.   docusate sodium 100 MG capsule Commonly known as: COLACE Take 1 capsule (100 mg total) by mouth 2 (two) times daily.   Eliquis 5 MG Tabs tablet Generic drug: apixaban TAKE 1 TABLET BY MOUTH TWICE A DAY What changed: how much to take   fentaNYL 100 MCG/HR Commonly known as: [employer] 1 patch onto the skin every 3 (three) days.   furosemide 20 MG tablet Commonly known as: LASIX Take 2 tablets (40 mg total) by mouth daily for 7 days, THEN 1 tablet (20 mg total) daily. Start taking on: November 08, 2020 What changed: See the new instructions.   loratadine 10 MG tablet Commonly known as: CLARITIN Take 10 mg by mouth daily.   magic mouthwash Soln Take 5 mLs by mouth 4 (four) times daily.   metFORMIN 1000 MG tablet Commonly known as: GLUCOPHAGE Take 1 tablet (1,000 mg total) by mouth 2 (two) times daily with a  meal.   methocarbamol 500 MG tablet Commonly known as: ROBAXIN Take 1 tablet (500 mg total) by mouth every 6 (six) hours as needed for muscle spasms.   oxyCODONE 5 MG immediate release tablet Commonly known as: Oxy IR/ROXICODONE 1 to 2 PO Q 4 hours prn pain What changed:   how much to take  how to take this  when to take this  reasons to take this  additional instructions   oxyCODONE-acetaminophen 5-325 MG tablet Commonly known as: Percocet Take 1-2 tablets by mouth 2 (two) times daily as needed for severe pain.   senna-docusate 8.6-50 MG tablet Commonly known as: Senokot-S Take 2 tablets by mouth 2 (two) times daily as needed for mild constipation.   sildenafil 20 MG tablet Commonly known as: REVATIO Take 1-5 tablets (20-100 mg total) by mouth daily as needed (ED). What changed: how much to take   Ziextenzo 6 MG/0.6ML injection Generic drug: pegfilgrastim-bmez Inject 6 mg into the skin every 21 ( twenty-one) days.            Discharge Care Instructions  (From admission, onward)         Start     Ordered   11/08/20 0000  Discharge wound care:       Comments: Monitoring, frequent turning and offloading of lower back   11/08/20 1059          Consultations:  None  Procedures/Studies: TTE on 11/07/2020 Endocardial border definition is poor. However, systolic function  appears to be normal. Left ventricular ejection fraction, by estimation,  is 55 to 60%. The left ventricle has normal function. The left ventricle  has no regional wall motion  abnormalities. Left ventricular diastolic parameters are consistent with  Grade I diastolic dysfunction (impaired relaxation).  2. Right ventricular systolic function is normal. The right ventricular  size is normal.  3. The mitral valve is normal in structure. No evidence of mitral valve  regurgitation. No evidence of mitral stenosis.  4. The aortic valve is normal in structure. Aortic valve regurgitation is   not visualized. No aortic stenosis is present.  5. The inferior vena cava is dilated in size with >50% respiratory  variability, suggesting right atrial pressure of 8 mmHg.    DG Chest 1 View  Result Date: 11/07/2020 CLINICAL DATA:  Status post thoracentesis. EXAM: CHEST  1 VIEW COMPARISON:  11/06/2020 FINDINGS: There is a left chest wall port a catheter with tip in the projection of the cavoatrial junction. Normal  heart size. Decreased volume of right pleural effusion status post thoracentesis. No significant pneumothorax identified. Similar appearance of right upper lobe cavitary lung lesion measuring approximately 4 cm. IMPRESSION: 1. Decreased volume of right pleural effusion status post thoracentesis. 2. No pneumothorax. Electronically Signed   By: Kerby Moors M.D.   On: 11/07/2020 12:13   CT Angio Chest PE W/Cm &/Or Wo Cm  Result Date: 11/06/2020 CLINICAL DATA:  Stage IV right lung cancer. History of pulmonary embolism. Worsening left abdominal pain and diarrhea for few days. Hypoxia. EXAM: CT ANGIOGRAPHY CHEST CT ABDOMEN AND PELVIS WITH CONTRAST TECHNIQUE: Multidetector CT imaging of the chest was performed using the standard protocol during bolus administration of intravenous contrast. Multiplanar CT image reconstructions and MIPs were obtained to evaluate the vascular anatomy. Multidetector CT imaging of the abdomen and pelvis was performed using the standard protocol during bolus administration of intravenous contrast. CONTRAST:  171mL OMNIPAQUE IOHEXOL 350 MG/ML SOLN COMPARISON:  10/10/2020 chest CT angiogram and CT abdomen/pelvis. FINDINGS: CTA CHEST FINDINGS Cardiovascular: The study is low to moderate quality for the evaluation of pulmonary embolism, with significant motion degradation. Chronic eccentric small nonocclusive bilateral pulmonary emboli in the peripheral right pulmonary artery (series 5/image 132) and segmental left lower lobe pulmonary artery branches (series 5/image  133), unchanged. Atherosclerotic nonaneurysmal thoracic aorta. Top-normal caliber main pulmonary artery (3.3 cm diameter). Normal heart size. Stable trace pericardial effusion/thickening. Left internal jugular Port-A-Cath terminates at the cavoatrial junction. Mediastinum/Nodes: No discrete thyroid nodules. Unremarkable esophagus. Mildly enlarged 1.0 cm left supraclavicular node (series 4/image 11), stable. No axillary adenopathy. Mildly enlarged 1.0 cm right hilar node (series 4/image 43), stable. No left hilar adenopathy. Mildly enlarged 1.0 cm AP window node (series 4/image 32), stable. Lungs/Pleura: No pneumothorax. Moderate dependent right pleural effusion largely new. No left pleural effusion. Cavitary irregular 4.5 x 2.8 cm posterior right upper lobe lung mass (series 6/image 49), previously 4.5 x 2.7 cm using similar measurement technique, stable. Passive atelectasis in the dependent right lower lobe. No new significant pulmonary nodules. Musculoskeletal: Expansile lytic lesion in the medial left clavicle measures 5.4 cm (series 6/image 19), increased from 4.7 cm on 10/10/2020 CT. Lytic 2.2 cm left humeral head lesion (series 6/image 6), increased from 1.8 cm. Stable lytic sternal lesion. Stable lytic expansile 5.6 cm right T11 posterior elements lesion (series 6/image 131) with stable mild superior T11 vertebral compression fracture. No new focal osseous lesions in the chest. Moderate thoracic spondylosis. Mild gynecomastia asymmetric to the right, stable. Review of the MIP images confirms the above findings. CT ABDOMEN and PELVIS FINDINGS Hepatobiliary: Three scattered hypodense right liver masses, stable to mildly increased. Segment 7 right liver 1.3 cm mass (series 1/image 15), increased from 0.8 cm. Inferior right liver 4.1 cm mass adjacent to the gallbladder (series 1/image 28), slightly increased from 3.8 cm. Far inferior right liver 3.3 cm mass (series 1/image 37), stable. Normal gallbladder with no  radiopaque cholelithiasis. No biliary ductal dilatation. Pancreas: Normal, with no mass or duct dilation. Spleen: Normal size. No mass. Adrenals/Urinary Tract: Normal adrenals. Normal kidneys with no hydronephrosis and no renal mass. Normal bladder. Stomach/Bowel: Normal non-distended stomach. Normal caliber small bowel with no small bowel wall thickening. Normal appendix. Moderate colonic stool. No large bowel wall thickening, diverticulosis or significant pericolonic fat stranding. Mild presacral space fat stranding and ill-defined fluid is not significantly changed and is nonspecific. Vascular/Lymphatic: Atherosclerotic abdominal aorta with ectatic 2.8 cm infrarenal abdominal aorta, stable. Patent portal, splenic, hepatic and renal veins. No  pathologically enlarged lymph nodes in the abdomen or pelvis. Reproductive: Normal size prostate. Other: No pneumoperitoneum, ascites or focal fluid collection. Musculoskeletal: Numerous lytic lesions throughout the pelvis including right upper and lower left sacrum, medial left iliac bone, right iliac wing and posterior right acetabulum, not appreciably changed since 10/10/2020 CT. No new focal osseous lesions. Mild lumbar spondylosis. Review of the MIP images confirms the above findings. IMPRESSION: 1. Limited motion degraded scan. No evidence of acute pulmonary embolism. Chronic eccentric small nonocclusive bilateral pulmonary emboli, unchanged. 2. Cavitary posterior right upper lobe lung mass, stable. 3. Moderate dependent right pleural effusion, largely new. 4. 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 5. Right liver metastases are stable to mildly increased. 6. Moderate colonic stool volume, suggesting constipation. No evidence of bowel obstruction or acute bowel inflammation. 7. Stable ectatic 2.8 cm infrarenal abdominal aorta. Recommend follow-up ultrasound  every 5 years. This recommendation follows ACR consensus guidelines: White Paper of the ACR Incidental Findings Committee II on Vascular Findings. J Am Coll Radiol 2013; 10:789-794. 8. Aortic Atherosclerosis (ICD10-I70.0). Electronically Signed   By: Ilona Sorrel M.D.   On: 11/06/2020 11:51   CT Angio Chest PE W and/or Wo Contrast  Result Date: 10/10/2020 CLINICAL DATA:  Bilateral flank pain.  Stage IV metastatic disease. EXAM: CT ANGIOGRAPHY CHEST CT ABDOMEN AND PELVIS WITH CONTRAST CT THORACIC SPINE CT LUMBAR SPINE TECHNIQUE: Multidetector CT imaging of the chest was performed using the standard protocol during bolus administration of intravenous contrast. Multiplanar CT image reconstructions and MIPs were obtained to evaluate the vascular anatomy. Multidetector CT imaging of the abdomen and pelvis was performed using the standard protocol during bolus administration of intravenous contrast. Multidetector CT images of the thoracic were obtained using the standard protocol without intravenous contrast. Multidetector CT images of the lumbar were obtained using the standard protocol without intravenous contrast. CONTRAST:  123mL OMNIPAQUE IOHEXOL 350 MG/ML SOLN COMPARISON:  CT chest, abdomen, pelvis 09/08/2020. FINDINGS: CTA CHEST FINDINGS Cardiovascular: Left chest wall Port-A-Cath with tip terminating in the region of the superior cavoatrial junction. Satisfactory opacification of the pulmonary arteries to the segmental level. Stable tiny nonocclusive central and left lower lobe segmental pulmonary embolus (124, 3:127). No new pulmonary embolus. Normal heart size. No pericardial effusion. At least mild left anterior descending coronary artery calcifications. Mediastinum/Nodes: Interval increase in size 1.6 cm (from 1.2 cm) right hilar lymph node. No left hilar lymph node. Stable borderline enlarged 1.1 cm prevascular lymph node. (3:103). No axillary lymphadenopathy. Lungs/Pleura: Interval increase in size of a  lobulated right upper lobe 2.7 x 3.7 X 3.3cm (from 2.4 x 3.6 x 2.8 cm) pulmonary mass. Interval increase in conspicuity and increase in size of a nodule like right lower lobe density measuring up to 2.6 cm (from 1.5 cm) (4:85). Interval decrease in size of a trace right pleural effusion. No left pleural effusion. No pneumothorax. Musculoskeletal: Redemonstration of a densely sclerotic left T12 posterior rib lesion (11:77). Redemonstration of a punctate densely sclerotic lesion within the left anterolateral third and posterolateral 7th ribs. No acute displaced rib fracture. No acute displaced sternal fracture. Please see below regarding spine. Review of the MIP images confirms the above findings. CT ABDOMEN and PELVIS FINDINGS Hepatobiliary: Interval increase in size of a right hepatic lobe 4.1 x 2.7 cm (from 3 x 2.1 cm) hypodense lesion (1:28). Interval increase in size and conspicuity of a right inferior hepatic lobe 4.3 x  2.1 (from 2.3 x 1 cm) lesion (1:39). Some areas of the liver appears slightly heterogeneous (1:19) with no definite lesion. No definite evidence of a new hepatic lesion on this single phase contrast study. No gallstones, gallbladder wall thickening, or pericholecystic fluid. No biliary dilatation. Pancreas: No focal lesion. Normal pancreatic contour. No surrounding inflammatory changes. No main pancreatic ductal dilatation. Spleen: Normal in size without focal abnormality. Adrenals/Urinary Tract: No adrenal nodule bilaterally. Bilateral kidneys enhance symmetrically. No nephroureterolithiasis. No hydronephrosis. No hydroureter. On delayed imaging, there is no urothelial wall thickening and there are no filling defects in the opacified portions of the bilateral collecting systems or ureters. The urinary bladder is unremarkable. Stomach/Bowel: Stomach is within normal limits. No evidence of bowel wall thickening or dilatation. Stool throughout the colon. Appendix appears normal. Vascular/Lymphatic:  No abdominal aorta or iliac aneurysm. Mild to moderate calcified and noncalcified atherosclerotic plaque of the aorta and its branches. Stable prominent retrocrural lymph nodes measuring up to 8 mm (1:23). Stable prominent retroperitoneal lymph nodes. Stable to slightly decreased in size enlarged right external iliac lymph node open (1:69). Now measuring 1.2 cm (from 1.5 cm). No abdominal or inguinal lymphadenopathy. Reproductive: Prostate is unremarkable. Other: No intraperitoneal free fluid. No intraperitoneal free gas. No organized fluid collection. Musculoskeletal: No acute displaced pelvic fracture. Partially visualized left proximal femur intramedullary nail fixation. No acute displaced fracture of the sacrum. Interval increase in size of a right iliac wing lytic lesion with cortical break (1:68). Redemonstration of a left iliac wing lytic lesion that extends to the superior/fall sacroiliac joint (5:92). Redemonstration of a lytic left sacral lesion (1:68, 5:103). CT THORACIC SPINE: Alignment: Twelve rib-bearing thoracic vertebral bodies are noted. Normal. Vertebrae: Multilevel mild-to-moderate degenerative changes of the spine. Slightly more conspicuous lytic lesion and cortical destruction of the right T11 vertebral body, lamina, and superior facet (11:58, 8:118). Interval development of superior endplate T11 concavity and vague vertebral body height loss. No acute displaced fracture. Paraspinal and other soft tissues: Unremarkable. Disc levels: Maintained. CT LUMBAR SPINE: Alignment: 5 non-rib-bearing lumbar vertebral bodies are noted. Normal. Vertebrae: No acute displaced fracture. No suspicious lytic or blastic osseous lesions. Paraspinal and other soft tissues: Unremarkable Disc levels: Maintained. Review of the MIP images confirms the above findings. IMPRESSION: 1. Interval development of a pathologic compression fracture with less than 5% height loss of the T11 vertebral body with persistent right  vertebral body lytic lesion that extends to the posterior elements. 2. Stable tiny nonocclusive central and left lower lobe segmental pulmonary embolus. 3. Interval increase in size of a right upper lobe pulmonary mass and right lower lobe nodular-like lesion. Associated stable to slightly increased right hilar and mediastinal lymph nodes. 4. Interval increase in size of hepatic metastatic lesions. 5. Some stable and some increased in size axial and appendicular skeleton lytic metastatic lesion. 6. Slight interval decrease in trace right pleural effusion. 7.  Aortic Atherosclerosis (ICD10-I70.0). Electronically Signed   By: Iven Finn M.D.   On: 10/10/2020 19:00   CT Abdomen Pelvis W Contrast  Result Date: 11/06/2020 CLINICAL DATA:  Stage IV right lung cancer. History of pulmonary embolism. Worsening left abdominal pain and diarrhea for few days. Hypoxia. EXAM: CT ANGIOGRAPHY CHEST CT ABDOMEN AND PELVIS WITH CONTRAST TECHNIQUE: Multidetector CT imaging of the chest was performed using the standard protocol during bolus administration of intravenous contrast. Multiplanar CT image reconstructions and MIPs were obtained to evaluate the vascular anatomy. Multidetector CT imaging of the abdomen and pelvis was performed using  the standard protocol during bolus administration of intravenous contrast. CONTRAST:  169mL OMNIPAQUE IOHEXOL 350 MG/ML SOLN COMPARISON:  10/10/2020 chest CT angiogram and CT abdomen/pelvis. FINDINGS: CTA CHEST FINDINGS Cardiovascular: The study is low to moderate quality for the evaluation of pulmonary embolism, with significant motion degradation. Chronic eccentric small nonocclusive bilateral pulmonary emboli in the peripheral right pulmonary artery (series 5/image 132) and segmental left lower lobe pulmonary artery branches (series 5/image 133), unchanged. Atherosclerotic nonaneurysmal thoracic aorta. Top-normal caliber main pulmonary artery (3.3 cm diameter). Normal heart size. Stable  trace pericardial effusion/thickening. Left internal jugular Port-A-Cath terminates at the cavoatrial junction. Mediastinum/Nodes: No discrete thyroid nodules. Unremarkable esophagus. Mildly enlarged 1.0 cm left supraclavicular node (series 4/image 11), stable. No axillary adenopathy. Mildly enlarged 1.0 cm right hilar node (series 4/image 43), stable. No left hilar adenopathy. Mildly enlarged 1.0 cm AP window node (series 4/image 32), stable. Lungs/Pleura: No pneumothorax. Moderate dependent right pleural effusion largely new. No left pleural effusion. Cavitary irregular 4.5 x 2.8 cm posterior right upper lobe lung mass (series 6/image 49), previously 4.5 x 2.7 cm using similar measurement technique, stable. Passive atelectasis in the dependent right lower lobe. No new significant pulmonary nodules. Musculoskeletal: Expansile lytic lesion in the medial left clavicle measures 5.4 cm (series 6/image 19), increased from 4.7 cm on 10/10/2020 CT. Lytic 2.2 cm left humeral head lesion (series 6/image 6), increased from 1.8 cm. Stable lytic sternal lesion. Stable lytic expansile 5.6 cm right T11 posterior elements lesion (series 6/image 131) with stable mild superior T11 vertebral compression fracture. No new focal osseous lesions in the chest. Moderate thoracic spondylosis. Mild gynecomastia asymmetric to the right, stable. Review of the MIP images confirms the above findings. CT ABDOMEN and PELVIS FINDINGS Hepatobiliary: Three scattered hypodense right liver masses, stable to mildly increased. Segment 7 right liver 1.3 cm mass (series 1/image 15), increased from 0.8 cm. Inferior right liver 4.1 cm mass adjacent to the gallbladder (series 1/image 28), slightly increased from 3.8 cm. Far inferior right liver 3.3 cm mass (series 1/image 37), stable. Normal gallbladder with no radiopaque cholelithiasis. No biliary ductal dilatation. Pancreas: Normal, with no mass or duct dilation. Spleen: Normal size. No mass.  Adrenals/Urinary Tract: Normal adrenals. Normal kidneys with no hydronephrosis and no renal mass. Normal bladder. Stomach/Bowel: Normal non-distended stomach. Normal caliber small bowel with no small bowel wall thickening. Normal appendix. Moderate colonic stool. No large bowel wall thickening, diverticulosis or significant pericolonic fat stranding. Mild presacral space fat stranding and ill-defined fluid is not significantly changed and is nonspecific. Vascular/Lymphatic: Atherosclerotic abdominal aorta with ectatic 2.8 cm infrarenal abdominal aorta, stable. Patent portal, splenic, hepatic and renal veins. No pathologically enlarged lymph nodes in the abdomen or pelvis. Reproductive: Normal size prostate. Other: No pneumoperitoneum, ascites or focal fluid collection. Musculoskeletal: Numerous lytic lesions throughout the pelvis including right upper and lower left sacrum, medial left iliac bone, right iliac wing and posterior right acetabulum, not appreciably changed since 10/10/2020 CT. No new focal osseous lesions. Mild lumbar spondylosis. Review of the MIP images confirms the above findings. IMPRESSION: 1. Limited motion degraded scan. No evidence of acute pulmonary embolism. Chronic eccentric small nonocclusive bilateral pulmonary emboli, unchanged. 2. Cavitary posterior right upper lobe lung mass, stable. 3. Moderate dependent right pleural effusion, largely new. 4. 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 5. Right liver metastases are stable to mildly increased. 6. Moderate colonic stool volume, suggesting constipation. No evidence  of bowel obstruction or acute bowel inflammation. 7. Stable ectatic 2.8 cm infrarenal abdominal aorta. Recommend follow-up ultrasound every 5 years. This recommendation follows ACR consensus guidelines: White Paper of the ACR Incidental Findings Committee II on Vascular  Findings. J Am Coll Radiol 2013; 10:789-794. 8. Aortic Atherosclerosis (ICD10-I70.0). Electronically Signed   By: Ilona Sorrel M.D.   On: 11/06/2020 11:51   CT ABDOMEN PELVIS W CONTRAST  Result Date: 10/10/2020 CLINICAL DATA:  Bilateral flank pain.  Stage IV metastatic disease. EXAM: CT ANGIOGRAPHY CHEST CT ABDOMEN AND PELVIS WITH CONTRAST CT THORACIC SPINE CT LUMBAR SPINE TECHNIQUE: Multidetector CT imaging of the chest was performed using the standard protocol during bolus administration of intravenous contrast. Multiplanar CT image reconstructions and MIPs were obtained to evaluate the vascular anatomy. Multidetector CT imaging of the abdomen and pelvis was performed using the standard protocol during bolus administration of intravenous contrast. Multidetector CT images of the thoracic were obtained using the standard protocol without intravenous contrast. Multidetector CT images of the lumbar were obtained using the standard protocol without intravenous contrast. CONTRAST:  150mL OMNIPAQUE IOHEXOL 350 MG/ML SOLN COMPARISON:  CT chest, abdomen, pelvis 09/08/2020. FINDINGS: CTA CHEST FINDINGS Cardiovascular: Left chest wall Port-A-Cath with tip terminating in the region of the superior cavoatrial junction. Satisfactory opacification of the pulmonary arteries to the segmental level. Stable tiny nonocclusive central and left lower lobe segmental pulmonary embolus (124, 3:127). No new pulmonary embolus. Normal heart size. No pericardial effusion. At least mild left anterior descending coronary artery calcifications. Mediastinum/Nodes: Interval increase in size 1.6 cm (from 1.2 cm) right hilar lymph node. No left hilar lymph node. Stable borderline enlarged 1.1 cm prevascular lymph node. (3:103). No axillary lymphadenopathy. Lungs/Pleura: Interval increase in size of a lobulated right upper lobe 2.7 x 3.7 X 3.3cm (from 2.4 x 3.6 x 2.8 cm) pulmonary mass. Interval increase in conspicuity and increase in size of a  nodule like right lower lobe density measuring up to 2.6 cm (from 1.5 cm) (4:85). Interval decrease in size of a trace right pleural effusion. No left pleural effusion. No pneumothorax. Musculoskeletal: Redemonstration of a densely sclerotic left T12 posterior rib lesion (11:77). Redemonstration of a punctate densely sclerotic lesion within the left anterolateral third and posterolateral 7th ribs. No acute displaced rib fracture. No acute displaced sternal fracture. Please see below regarding spine. Review of the MIP images confirms the above findings. CT ABDOMEN and PELVIS FINDINGS Hepatobiliary: Interval increase in size of a right hepatic lobe 4.1 x 2.7 cm (from 3 x 2.1 cm) hypodense lesion (1:28). Interval increase in size and conspicuity of a right inferior hepatic lobe 4.3 x 2.1 (from 2.3 x 1 cm) lesion (1:39). Some areas of the liver appears slightly heterogeneous (1:19) with no definite lesion. No definite evidence of a new hepatic lesion on this single phase contrast study. No gallstones, gallbladder wall thickening, or pericholecystic fluid. No biliary dilatation. Pancreas: No focal lesion. Normal pancreatic contour. No surrounding inflammatory changes. No main pancreatic ductal dilatation. Spleen: Normal in size without focal abnormality. Adrenals/Urinary Tract: No adrenal nodule bilaterally. Bilateral kidneys enhance symmetrically. No nephroureterolithiasis. No hydronephrosis. No hydroureter. On delayed imaging, there is no urothelial wall thickening and there are no filling defects in the opacified portions of the bilateral collecting systems or ureters. The urinary bladder is unremarkable. Stomach/Bowel: Stomach is within normal limits. No evidence of bowel wall thickening or dilatation. Stool throughout the colon. Appendix appears normal. Vascular/Lymphatic: No abdominal aorta or iliac aneurysm. Mild to moderate calcified  and noncalcified atherosclerotic plaque of the aorta and its branches. Stable  prominent retrocrural lymph nodes measuring up to 8 mm (1:23). Stable prominent retroperitoneal lymph nodes. Stable to slightly decreased in size enlarged right external iliac lymph node open (1:69). Now measuring 1.2 cm (from 1.5 cm). No abdominal or inguinal lymphadenopathy. Reproductive: Prostate is unremarkable. Other: No intraperitoneal free fluid. No intraperitoneal free gas. No organized fluid collection. Musculoskeletal: No acute displaced pelvic fracture. Partially visualized left proximal femur intramedullary nail fixation. No acute displaced fracture of the sacrum. Interval increase in size of a right iliac wing lytic lesion with cortical break (1:68). Redemonstration of a left iliac wing lytic lesion that extends to the superior/fall sacroiliac joint (5:92). Redemonstration of a lytic left sacral lesion (1:68, 5:103). CT THORACIC SPINE: Alignment: Twelve rib-bearing thoracic vertebral bodies are noted. Normal. Vertebrae: Multilevel mild-to-moderate degenerative changes of the spine. Slightly more conspicuous lytic lesion and cortical destruction of the right T11 vertebral body, lamina, and superior facet (11:58, 8:118). Interval development of superior endplate T11 concavity and vague vertebral body height loss. No acute displaced fracture. Paraspinal and other soft tissues: Unremarkable. Disc levels: Maintained. CT LUMBAR SPINE: Alignment: 5 non-rib-bearing lumbar vertebral bodies are noted. Normal. Vertebrae: No acute displaced fracture. No suspicious lytic or blastic osseous lesions. Paraspinal and other soft tissues: Unremarkable Disc levels: Maintained. Review of the MIP images confirms the above findings. IMPRESSION: 1. Interval development of a pathologic compression fracture with less than 5% height loss of the T11 vertebral body with persistent right vertebral body lytic lesion that extends to the posterior elements. 2. Stable tiny nonocclusive central and left lower lobe segmental pulmonary  embolus. 3. Interval increase in size of a right upper lobe pulmonary mass and right lower lobe nodular-like lesion. Associated stable to slightly increased right hilar and mediastinal lymph nodes. 4. Interval increase in size of hepatic metastatic lesions. 5. Some stable and some increased in size axial and appendicular skeleton lytic metastatic lesion. 6. Slight interval decrease in trace right pleural effusion. 7.  Aortic Atherosclerosis (ICD10-I70.0). Electronically Signed   By: Iven Finn M.D.   On: 10/10/2020 19:00   CT T-SPINE NO CHARGE  Result Date: 10/10/2020 CLINICAL DATA:  Bilateral flank pain.  Stage IV metastatic disease. EXAM: CT ANGIOGRAPHY CHEST CT ABDOMEN AND PELVIS WITH CONTRAST CT THORACIC SPINE CT LUMBAR SPINE TECHNIQUE: Multidetector CT imaging of the chest was performed using the standard protocol during bolus administration of intravenous contrast. Multiplanar CT image reconstructions and MIPs were obtained to evaluate the vascular anatomy. Multidetector CT imaging of the abdomen and pelvis was performed using the standard protocol during bolus administration of intravenous contrast. Multidetector CT images of the thoracic were obtained using the standard protocol without intravenous contrast. Multidetector CT images of the lumbar were obtained using the standard protocol without intravenous contrast. CONTRAST:  14mL OMNIPAQUE IOHEXOL 350 MG/ML SOLN COMPARISON:  CT chest, abdomen, pelvis 09/08/2020. FINDINGS: CTA CHEST FINDINGS Cardiovascular: Left chest wall Port-A-Cath with tip terminating in the region of the superior cavoatrial junction. Satisfactory opacification of the pulmonary arteries to the segmental level. Stable tiny nonocclusive central and left lower lobe segmental pulmonary embolus (124, 3:127). No new pulmonary embolus. Normal heart size. No pericardial effusion. At least mild left anterior descending coronary artery calcifications. Mediastinum/Nodes: Interval  increase in size 1.6 cm (from 1.2 cm) right hilar lymph node. No left hilar lymph node. Stable borderline enlarged 1.1 cm prevascular lymph node. (3:103). No axillary lymphadenopathy. Lungs/Pleura: Interval increase in size of  a lobulated right upper lobe 2.7 x 3.7 X 3.3cm (from 2.4 x 3.6 x 2.8 cm) pulmonary mass. Interval increase in conspicuity and increase in size of a nodule like right lower lobe density measuring up to 2.6 cm (from 1.5 cm) (4:85). Interval decrease in size of a trace right pleural effusion. No left pleural effusion. No pneumothorax. Musculoskeletal: Redemonstration of a densely sclerotic left T12 posterior rib lesion (11:77). Redemonstration of a punctate densely sclerotic lesion within the left anterolateral third and posterolateral 7th ribs. No acute displaced rib fracture. No acute displaced sternal fracture. Please see below regarding spine. Review of the MIP images confirms the above findings. CT ABDOMEN and PELVIS FINDINGS Hepatobiliary: Interval increase in size of a right hepatic lobe 4.1 x 2.7 cm (from 3 x 2.1 cm) hypodense lesion (1:28). Interval increase in size and conspicuity of a right inferior hepatic lobe 4.3 x 2.1 (from 2.3 x 1 cm) lesion (1:39). Some areas of the liver appears slightly heterogeneous (1:19) with no definite lesion. No definite evidence of a new hepatic lesion on this single phase contrast study. No gallstones, gallbladder wall thickening, or pericholecystic fluid. No biliary dilatation. Pancreas: No focal lesion. Normal pancreatic contour. No surrounding inflammatory changes. No main pancreatic ductal dilatation. Spleen: Normal in size without focal abnormality. Adrenals/Urinary Tract: No adrenal nodule bilaterally. Bilateral kidneys enhance symmetrically. No nephroureterolithiasis. No hydronephrosis. No hydroureter. On delayed imaging, there is no urothelial wall thickening and there are no filling defects in the opacified portions of the bilateral collecting  systems or ureters. The urinary bladder is unremarkable. Stomach/Bowel: Stomach is within normal limits. No evidence of bowel wall thickening or dilatation. Stool throughout the colon. Appendix appears normal. Vascular/Lymphatic: No abdominal aorta or iliac aneurysm. Mild to moderate calcified and noncalcified atherosclerotic plaque of the aorta and its branches. Stable prominent retrocrural lymph nodes measuring up to 8 mm (1:23). Stable prominent retroperitoneal lymph nodes. Stable to slightly decreased in size enlarged right external iliac lymph node open (1:69). Now measuring 1.2 cm (from 1.5 cm). No abdominal or inguinal lymphadenopathy. Reproductive: Prostate is unremarkable. Other: No intraperitoneal free fluid. No intraperitoneal free gas. No organized fluid collection. Musculoskeletal: No acute displaced pelvic fracture. Partially visualized left proximal femur intramedullary nail fixation. No acute displaced fracture of the sacrum. Interval increase in size of a right iliac wing lytic lesion with cortical break (1:68). Redemonstration of a left iliac wing lytic lesion that extends to the superior/fall sacroiliac joint (5:92). Redemonstration of a lytic left sacral lesion (1:68, 5:103). CT THORACIC SPINE: Alignment: Twelve rib-bearing thoracic vertebral bodies are noted. Normal. Vertebrae: Multilevel mild-to-moderate degenerative changes of the spine. Slightly more conspicuous lytic lesion and cortical destruction of the right T11 vertebral body, lamina, and superior facet (11:58, 8:118). Interval development of superior endplate T11 concavity and vague vertebral body height loss. No acute displaced fracture. Paraspinal and other soft tissues: Unremarkable. Disc levels: Maintained. CT LUMBAR SPINE: Alignment: 5 non-rib-bearing lumbar vertebral bodies are noted. Normal. Vertebrae: No acute displaced fracture. No suspicious lytic or blastic osseous lesions. Paraspinal and other soft tissues: Unremarkable Disc  levels: Maintained. Review of the MIP images confirms the above findings. IMPRESSION: 1. Interval development of a pathologic compression fracture with less than 5% height loss of the T11 vertebral body with persistent right vertebral body lytic lesion that extends to the posterior elements. 2. Stable tiny nonocclusive central and left lower lobe segmental pulmonary embolus. 3. Interval increase in size of a right upper lobe pulmonary mass and right  lower lobe nodular-like lesion. Associated stable to slightly increased right hilar and mediastinal lymph nodes. 4. Interval increase in size of hepatic metastatic lesions. 5. Some stable and some increased in size axial and appendicular skeleton lytic metastatic lesion. 6. Slight interval decrease in trace right pleural effusion. 7.  Aortic Atherosclerosis (ICD10-I70.0). Electronically Signed   By: Iven Finn M.D.   On: 10/10/2020 19:00   CT L-SPINE NO CHARGE  Result Date: 10/10/2020 CLINICAL DATA:  Bilateral flank pain.  Stage IV metastatic disease. EXAM: CT ANGIOGRAPHY CHEST CT ABDOMEN AND PELVIS WITH CONTRAST CT THORACIC SPINE CT LUMBAR SPINE TECHNIQUE: Multidetector CT imaging of the chest was performed using the standard protocol during bolus administration of intravenous contrast. Multiplanar CT image reconstructions and MIPs were obtained to evaluate the vascular anatomy. Multidetector CT imaging of the abdomen and pelvis was performed using the standard protocol during bolus administration of intravenous contrast. Multidetector CT images of the thoracic were obtained using the standard protocol without intravenous contrast. Multidetector CT images of the lumbar were obtained using the standard protocol without intravenous contrast. CONTRAST:  168mL OMNIPAQUE IOHEXOL 350 MG/ML SOLN COMPARISON:  CT chest, abdomen, pelvis 09/08/2020. FINDINGS: CTA CHEST FINDINGS Cardiovascular: Left chest wall Port-A-Cath with tip terminating in the region of the superior  cavoatrial junction. Satisfactory opacification of the pulmonary arteries to the segmental level. Stable tiny nonocclusive central and left lower lobe segmental pulmonary embolus (124, 3:127). No new pulmonary embolus. Normal heart size. No pericardial effusion. At least mild left anterior descending coronary artery calcifications. Mediastinum/Nodes: Interval increase in size 1.6 cm (from 1.2 cm) right hilar lymph node. No left hilar lymph node. Stable borderline enlarged 1.1 cm prevascular lymph node. (3:103). No axillary lymphadenopathy. Lungs/Pleura: Interval increase in size of a lobulated right upper lobe 2.7 x 3.7 X 3.3cm (from 2.4 x 3.6 x 2.8 cm) pulmonary mass. Interval increase in conspicuity and increase in size of a nodule like right lower lobe density measuring up to 2.6 cm (from 1.5 cm) (4:85). Interval decrease in size of a trace right pleural effusion. No left pleural effusion. No pneumothorax. Musculoskeletal: Redemonstration of a densely sclerotic left T12 posterior rib lesion (11:77). Redemonstration of a punctate densely sclerotic lesion within the left anterolateral third and posterolateral 7th ribs. No acute displaced rib fracture. No acute displaced sternal fracture. Please see below regarding spine. Review of the MIP images confirms the above findings. CT ABDOMEN and PELVIS FINDINGS Hepatobiliary: Interval increase in size of a right hepatic lobe 4.1 x 2.7 cm (from 3 x 2.1 cm) hypodense lesion (1:28). Interval increase in size and conspicuity of a right inferior hepatic lobe 4.3 x 2.1 (from 2.3 x 1 cm) lesion (1:39). Some areas of the liver appears slightly heterogeneous (1:19) with no definite lesion. No definite evidence of a new hepatic lesion on this single phase contrast study. No gallstones, gallbladder wall thickening, or pericholecystic fluid. No biliary dilatation. Pancreas: No focal lesion. Normal pancreatic contour. No surrounding inflammatory changes. No main pancreatic ductal  dilatation. Spleen: Normal in size without focal abnormality. Adrenals/Urinary Tract: No adrenal nodule bilaterally. Bilateral kidneys enhance symmetrically. No nephroureterolithiasis. No hydronephrosis. No hydroureter. On delayed imaging, there is no urothelial wall thickening and there are no filling defects in the opacified portions of the bilateral collecting systems or ureters. The urinary bladder is unremarkable. Stomach/Bowel: Stomach is within normal limits. No evidence of bowel wall thickening or dilatation. Stool throughout the colon. Appendix appears normal. Vascular/Lymphatic: No abdominal aorta or iliac aneurysm. Mild  to moderate calcified and noncalcified atherosclerotic plaque of the aorta and its branches. Stable prominent retrocrural lymph nodes measuring up to 8 mm (1:23). Stable prominent retroperitoneal lymph nodes. Stable to slightly decreased in size enlarged right external iliac lymph node open (1:69). Now measuring 1.2 cm (from 1.5 cm). No abdominal or inguinal lymphadenopathy. Reproductive: Prostate is unremarkable. Other: No intraperitoneal free fluid. No intraperitoneal free gas. No organized fluid collection. Musculoskeletal: No acute displaced pelvic fracture. Partially visualized left proximal femur intramedullary nail fixation. No acute displaced fracture of the sacrum. Interval increase in size of a right iliac wing lytic lesion with cortical break (1:68). Redemonstration of a left iliac wing lytic lesion that extends to the superior/fall sacroiliac joint (5:92). Redemonstration of a lytic left sacral lesion (1:68, 5:103). CT THORACIC SPINE: Alignment: Twelve rib-bearing thoracic vertebral bodies are noted. Normal. Vertebrae: Multilevel mild-to-moderate degenerative changes of the spine. Slightly more conspicuous lytic lesion and cortical destruction of the right T11 vertebral body, lamina, and superior facet (11:58, 8:118). Interval development of superior endplate T11 concavity and  vague vertebral body height loss. No acute displaced fracture. Paraspinal and other soft tissues: Unremarkable. Disc levels: Maintained. CT LUMBAR SPINE: Alignment: 5 non-rib-bearing lumbar vertebral bodies are noted. Normal. Vertebrae: No acute displaced fracture. No suspicious lytic or blastic osseous lesions. Paraspinal and other soft tissues: Unremarkable Disc levels: Maintained. Review of the MIP images confirms the above findings. IMPRESSION: 1. Interval development of a pathologic compression fracture with less than 5% height loss of the T11 vertebral body with persistent right vertebral body lytic lesion that extends to the posterior elements. 2. Stable tiny nonocclusive central and left lower lobe segmental pulmonary embolus. 3. Interval increase in size of a right upper lobe pulmonary mass and right lower lobe nodular-like lesion. Associated stable to slightly increased right hilar and mediastinal lymph nodes. 4. Interval increase in size of hepatic metastatic lesions. 5. Some stable and some increased in size axial and appendicular skeleton lytic metastatic lesion. 6. Slight interval decrease in trace right pleural effusion. 7.  Aortic Atherosclerosis (ICD10-I70.0). Electronically Signed   By: Iven Finn M.D.   On: 10/10/2020 19:00   DG Chest Port 1 View  Result Date: 11/06/2020 CLINICAL DATA:  Shortness of breath. EXAM: PORTABLE CHEST 1 VIEW COMPARISON:  January 20, 2020. FINDINGS: The heart size and mediastinal contours are within normal limits. No pneumothorax is noted. Left internal jugular Port-A-Cath is unchanged in position. Left lung is clear. Right upper lobe opacity is enlarged concerning for enlarging mass. Increased right lower lobe opacity is noted concerning for atelectasis with associated effusion. The visualized skeletal structures are unremarkable. IMPRESSION: Right upper lobe opacity is enlarged concerning for enlarging mass. Increased right lower lobe opacity is noted concerning  for atelectasis or pneumonia with associated effusion. Electronically Signed   By: Marijo Conception M.D.   On: 11/06/2020 13:07   ECHOCARDIOGRAM COMPLETE  Result Date: 11/07/2020    ECHOCARDIOGRAM REPORT   Patient Name:   Mike [name] [facility] Date of Exam: 11/07/2020 Medical Rec #:  [id number]       Height:       71.0 in Accession #:    [id number]      Weight:       246.6 lb Date of Birth:  03-Aug-1966       BSA:          2.305 m Patient Age:    22 years        BP:  138/91 mmHg Patient Gender: M               HR:           96 bpm. Exam Location:  Inpatient Procedure: 2D Echo, Cardiac Doppler and Color Doppler Indications:    CHF  History:        Patient has prior history of Echocardiogram examinations, most                 recent 01/20/2020. Signs/Symptoms:Shortness of Breath and Pleural                 effusion; Risk Factors:Dyslipidemia, Diabetes and Obesity.                 Metastaic lung cancer, chemo.  Sonographer:    Dustin Flock Referring Phys: [place]  Sonographer Comments: Technically difficult study due to poor echo windows and patient is morbidly obese. Image acquisition challenging due to respiratory motion. Difficult images due to lung cancer and patient unable to roll onto left side. IMPRESSIONS  1. Endocardial border definition is poor. However, systolic function appears to be normal. Left ventricular ejection fraction, by estimation, is 55 to 60%. The left ventricle has normal function. The left ventricle has no regional wall motion abnormalities. Left ventricular diastolic parameters are consistent with Grade I diastolic dysfunction (impaired relaxation).  2. Right ventricular systolic function is normal. The right ventricular size is normal.  3. The mitral valve is normal in structure. No evidence of mitral valve regurgitation. No evidence of mitral stenosis.  4. The aortic valve is normal in structure. Aortic valve regurgitation is not visualized. No aortic stenosis is  present.  5. The inferior vena cava is dilated in size with >50% respiratory variability, suggesting right atrial pressure of 8 mmHg. FINDINGS  Left Ventricle: Endocardial border definition is poor. However, systolic function appears to be normal. Left ventricular ejection fraction, by estimation, is 55 to 60%. The left ventricle has normal function. The left ventricle has no regional wall motion abnormalities. The left ventricular internal cavity size was normal in size. There is no left ventricular hypertrophy. Left ventricular diastolic parameters are consistent with Grade I diastolic dysfunction (impaired relaxation). Normal left ventricular filling pressure. Right Ventricle: The right ventricular size is normal. No increase in right ventricular wall thickness. Right ventricular systolic function is normal. Left Atrium: Left atrial size was normal in size. Right Atrium: Right atrial size was normal in size. Pericardium: There is no evidence of pericardial effusion. Mitral Valve: The mitral valve is normal in structure. No evidence of mitral valve regurgitation. No evidence of mitral valve stenosis. Tricuspid Valve: The tricuspid valve is normal in structure. Tricuspid valve regurgitation is not demonstrated. No evidence of tricuspid stenosis. Aortic Valve: The aortic valve is normal in structure. Aortic valve regurgitation is not visualized. No aortic stenosis is present. Pulmonic Valve: The pulmonic valve was normal in structure. Pulmonic valve regurgitation is not visualized. No evidence of pulmonic stenosis. Aorta: The aortic root is normal in size and structure. Venous: The inferior vena cava is dilated in size with greater than 50% respiratory variability, suggesting right atrial pressure of 8 mmHg. IAS/Shunts: No atrial level shunt detected by color flow Doppler.  LEFT VENTRICLE PLAX 2D LVIDd:         3.40 cm  Diastology LVIDs:         2.90 cm  LV e' medial:    9.20 cm/s LV PW:  1.30 cm  LV E/e'  medial:  6.2 LV IVS:        1.40 cm  LV e' lateral:   6.53 cm/s LVOT diam:     2.30 cm  LV E/e' lateral: 8.7 LV SV:         59 LV SV Index:   26 LVOT Area:     4.15 cm  RIGHT VENTRICLE RV Basal diam:  3.30 cm RV S prime:     15.10 cm/s TAPSE (M-mode): 2.5 cm LEFT ATRIUM             Index       RIGHT ATRIUM           Index LA diam:        3.60 cm 1.56 cm/m  RA Area:     19.90 cm LA Vol (A2C):   41.6 ml 18.05 ml/m RA Volume:   62.30 ml  27.03 ml/m LA Vol (A4C):   66.0 ml 28.64 ml/m LA Biplane Vol: 54.2 ml 23.52 ml/m  AORTIC VALVE LVOT Vmax:   93.10 cm/s LVOT Vmean:  62.200 cm/s LVOT VTI:    0.142 m  AORTA Ao Root diam: 3.50 cm MITRAL VALVE MV Area (PHT): 7.66 cm     SHUNTS MV Decel Time: 99 msec      Systemic VTI:  0.14 m MV E velocity: 56.80 cm/s   Systemic Diam: 2.30 cm MV A velocity: 109.00 cm/s MV E/A ratio:  0.52 Skeet Latch MD Electronically signed by Skeet Latch MD Signature Date/Time: 11/07/2020/12:02:36 PM    Final    US THORACENTESIS ASP PLEURAL SPACE W/IMG GUIDE  Result Date: 11/07/2020 INDICATION: Patient with history of stage IV lung cancer, back/shoulder pain, pulmonary emboli, right pleural effusion. Request received for diagnostic and therapeutic right thoracentesis. EXAM: ULTRASOUND GUIDED DIAGNOSTIC AND THERAPEUTIC RIGHT THORACENTESIS MEDICATIONS: 1% lidocaine to skin and subcutaneous tissue COMPLICATIONS: None immediate. PROCEDURE: An ultrasound guided thoracentesis was thoroughly discussed with the patient and questions answered. The benefits, risks, alternatives and complications were also discussed. The patient understands and wishes to proceed with the procedure. Written consent was obtained. Ultrasound was performed to localize and mark an adequate pocket of fluid in the right chest. The area was then prepped and draped in the normal sterile fashion. 1% Lidocaine was used for local anesthesia. Under ultrasound guidance a 6 Fr Safe-T-Centesis catheter was introduced.  Thoracentesis was performed. The catheter was removed and a dressing applied. FINDINGS: A total of approximately 1.1 liters of yellow fluid was removed. Samples were sent to the laboratory as requested by the clinical team. IMPRESSION: Successful ultrasound guided diagnostic and therapeutic right thoracentesis yielding 1.1 liters of pleural fluid. Read by: Rowe Robert, PA-C Electronically Signed   By: Sandi Mariscal M.D.   On: 11/07/2020 11:37        The results of significant diagnostics from this hospitalization (including imaging, microbiology, ancillary and laboratory) are listed below for reference.     Microbiology: Recent Results (from the past 240 hour(s))  Body fluid culture     Status: None (Preliminary result)   Collection Time: 11/07/20 11:35 AM   Specimen: Pleural, Right; Body Fluid  Result Value Ref Range Status   Specimen Description   Final    Pleural R Performed at [facility] [address] [place], [place] [address]    Special Requests   Final    NONE Performed at [facility], [place] [address], [place], [place] [address]  Gram Stain   Final    FEW WBC PRESENT,BOTH PMN AND MONONUCLEAR NO ORGANISMS SEEN    Culture   Final    NO GROWTH < 24 HOURS Performed at [facility] [address], [place], [place] [address]    Report Status PENDING  Incomplete     Labs: BNP (last 3 results) Recent Labs    01/22/20 1027 11/06/20 1845  BNP 108.1* 54.6   Basic Metabolic Panel: Recent Labs  Lab 11/06/20 1009 11/07/20 0354  NA 137 137  K 4.3 4.2  CL 101 101  CO2 25 24  GLUCOSE 93 97  BUN 7 5*  CREATININE 0.57* 0.49*  CALCIUM 8.3* 8.1*   Liver Function Tests: Recent Labs  Lab 11/06/20 1009 11/07/20 0354  AST 17 17  ALT 11 12  ALKPHOS 105 98  BILITOT 0.2* 0.5  PROT 6.2* 5.9*  ALBUMIN 2.6* 2.5*   Recent Labs  Lab 11/06/20 1009  LIPASE 19   No results for input(s): AMMONIA in the last 168  hours. CBC: Recent Labs  Lab 11/06/20 1009 11/07/20 0354 11/08/20 1029  WBC 11.8* 12.5* 11.0*  NEUTROABS 9.6*  --   --   HGB 9.6* 9.7* 10.2*  HCT 31.6* 31.5* 32.7*  MCV 95.2 94.9 93.7  PLT 212 207 241   Cardiac Enzymes: No results for input(s): CKTOTAL, CKMB, CKMBINDEX, TROPONINI in the last 168 hours. BNP: Invalid input(s): POCBNP CBG: Recent Labs  Lab 11/07/20 0838 11/07/20 1252 11/07/20 1827 11/07/20 2114 11/08/20 0742  GLUCAP 80 107* 90 109* 96   D-Dimer No results for input(s): DDIMER in the last 72 hours. Hgb A1c Recent Labs    11/07/20 0353  HGBA1C 5.7*   Lipid Profile No results for input(s): CHOL, HDL, LDLCALC, TRIG, CHOLHDL, LDLDIRECT in the last 72 hours. Thyroid function studies No results for input(s): TSH, T4TOTAL, T3FREE, THYROIDAB in the last 72 hours.  Invalid input(s): FREET3 Anemia work up No results for input(s): VITAMINB12, FOLATE, FERRITIN, TIBC, IRON, RETICCTPCT in the last 72 hours. Urinalysis    Component Value Date/Time   COLORURINE YELLOW 11/06/2020 1640   APPEARANCEUR CLEAR 11/06/2020 1640   LABSPEC 1.028 11/06/2020 1640   PHURINE 6.0 11/06/2020 1640   GLUCOSEU NEGATIVE 11/06/2020 1640   HGBUR NEGATIVE 11/06/2020 1640   BILIRUBINUR NEGATIVE 11/06/2020 1640   BILIRUBINUR negative 09/04/2019 1014   KETONESUR NEGATIVE 11/06/2020 1640   PROTEINUR NEGATIVE 11/06/2020 1640   UROBILINOGEN 0.2 09/04/2019 1014   NITRITE NEGATIVE 11/06/2020 1640   LEUKOCYTESUR NEGATIVE 11/06/2020 1640   Sepsis Labs Invalid input(s): PROCALCITONIN,  WBC,  LACTICIDVEN   Time coordinating discharge: 45 minutes  SIGNED:  Mercy Riding, MD  [facility] 11/08/2020, 11:00 AM  If 7PM-7AM, please contact night-coverage [email or web address]

## 2020-11-08 NOTE — Evaluation (Signed)
Occupational Therapy Evaluation Patient Details Name: Mike [name] MRN: [id number] DOB: Jun 29, 1966 Today's Date: 11/08/2020    History of Present Illness 55 y.o. male with medical history significant of stage IV non-small cell lung cancer, squamous cell carcinoma, followed by Dr. Earlie Server currently on palliative systemic chemotherapy with docetaxel and Cyramza, obesity, history of PEs,hyperlipidemia, diabetes mellitus, presents to the hospital with complaints of increasing back pain over the last several days, as well as shortness of breath.  Patient was admitted with acute hypoxemic respiratory failure in the setting of new onset pleural effusion and volume overload. stable T11 comp fx   Clinical Impression   Patient evaluated by Occupational Therapy with no further acute OT needs identified. All education has been completed and the patient has no further questions for therapist.  Will defer to Home health OT to address pt goals in pt's own environment with his DME/AE.  See below for any follow-up Occupational Therapy or equipment needs. OT is signing off. Thank you for this referral.     Follow Up Recommendations  Home health OT;Supervision/Assistance - 24 hour    Equipment Recommendations  Tub/shower seat    Recommendations for Other Services       Precautions / Restrictions Precautions Precautions: Fall;Back Restrictions Weight Bearing Restrictions: No      Mobility Bed Mobility Overal bed mobility: Needs Assistance Bed Mobility: Sit to Supine;Supine to Sit     Supine to sit: Min guard Sit to supine: Min assist   General bed mobility comments: assist to lift LLE into bed, difficulty with log roll to L side d/t L shoulder pain. Increased time and effort with supine to sit with Min guard for trunk.    Transfers Overall transfer level: Needs assistance Equipment used: Rolling walker (2 wheeled) Transfers: Sit to/from [facility] Sit to Stand: Min  assist;Min guard Stand pivot transfers: Min guard;Min assist       General transfer comment: light assist to rise and transition to RW, light assist to steady with turn to [facility]    Balance Overall balance assessment: Needs assistance Sitting-balance support: No upper extremity supported;Feet supported Sitting balance-Leahy Scale: Fair     Standing balance support: Bilateral upper extremity supported Standing balance-Leahy Scale: Poor Standing balance comment: reliant on UEs                           ADL either performed or assessed with clinical judgement   ADL Overall ADL's : Needs assistance/impaired Eating/Feeding: Independent   Grooming: Sitting;Set up;Oral care Grooming Details (indicate cue type and reason): Pt declined standing at sink due to increased pain with static standing. Upper Body Bathing: Set up;Sitting   Lower Body Bathing: Minimal assistance;Sitting/lateral leans;Sit to/from stand   Upper Body Dressing : Set up;Min guard;Sitting   Lower Body Dressing: Sitting/lateral leans;Sit to/from Health and safety inspector Details (indicate cue type and reason): Pt just back to bed after physical therapy and pleasantly requested to keep OT evaluations to EOB level due to increased pain after mobilizing with PT. Toileting- Water quality scientist and Hygiene: Min guard;Sitting/lateral lean;Sit to/from stand Toileting - Clothing Manipulation Details (indicate cue type and reason): Based on general assessment.     Functional mobility during ADLs: Min guard       Vision Baseline Vision/History: No visual deficits Vision Assessment?: No apparent visual deficits     Perception     Praxis      Pertinent Vitals/Pain Pain  Assessment: 0-10 Pain Score: 7  Faces Pain Scale: Hurts even more Pain Location: L shoulder, L groin, and back Pain Descriptors / Indicators: Grimacing Pain Intervention(s): Limited activity within patient's tolerance;Monitored during  session;Repositioned     Hand Dominance Right   Extremity/Trunk Assessment Upper Extremity Assessment Upper Extremity Assessment: LUE deficits/detail;Generalized weakness LUE: Unable to fully assess due to pain   Lower Extremity Assessment Lower Extremity Assessment: Defer to PT evaluation   Cervical / Trunk Assessment Cervical / Trunk Assessment: Normal   Communication Communication Communication: No difficulties   Cognition Arousal/Alertness: Awake/alert Behavior During Therapy: WFL for tasks assessed/performed Overall Cognitive Status: Within Functional Limits for tasks assessed                                     General Comments       Exercises     Shoulder Instructions      Home Living Family/patient expects to be discharged to:: Private residence Living Arrangements: Spouse/significant other;Other relatives Available Help at Discharge: Family;Available 24 hours/day Type of Home: Mobile home Home Access: Ramped entrance Entrance Stairs-Number of Steps: 4 Entrance Stairs-Rails: Right;Left Home Layout: One level     Bathroom Shower/Tub: Walk-in shower         Home Equipment: Cane - quad;Bedside commode;Adaptive equipment;Walker - 2 wheels;Hospital bed;Wheelchair - Higher education careers adviser: Reacher;Sock aid        Prior Functioning/Environment Level of Independence: Needs assistance  Gait / Transfers Assistance Needed: amb with RW until back injury, pt reports decr amb since then, but reports he can ambulate more easily than he can tolerate static standing. ADL's / Homemaking Assistance Needed: Pt reports typically Modified Independent, but since injury he occasionally needs assist with donning pants over feet            OT Problem List: Decreased strength;Pain;Impaired balance (sitting and/or standing);Impaired UE functional use;Decreased activity tolerance      OT Treatment/Interventions:      OT Goals(Current goals can be found  in the care plan section) Acute Rehab OT Goals Patient Stated Goal: Determine source of LT groin pain before leaving the hospital. OT Goal Formulation: With patient  OT Frequency:     Barriers to D/C:            Co-evaluation              AM-PAC OT "6 Clicks" Daily Activity     Outcome Measure Help from another person eating meals?: None Help from another person taking care of personal grooming?: A Little Help from another person toileting, which includes using toliet, bedpan, or urinal?: A Little Help from another person bathing (including washing, rinsing, drying)?: A Little Help from another person to put on and taking off regular upper body clothing?: A Little Help from another person to put on and taking off regular lower body clothing?: A Little 6 Click Score: 19   End of Session Nurse Communication: Mobility status  Activity Tolerance: Patient limited by pain Patient left: in bed;with call bell/phone within reach;with bed alarm set  OT Visit Diagnosis: Pain;Muscle weakness (generalized) (M62.81) Pain - Right/Left: Left Pain - part of body: Shoulder (L groin and back)                Time: 1937-9024 OT Time Calculation (min): 21 min Charges:  OT General Charges $OT Visit: 1 Visit OT Evaluation $OT Eval Low Complexity: 1 Low  [place], [place] Office: [phone] 11/08/2020  Julien Girt 11/08/2020, 11:33 AM

## 2020-11-08 NOTE — Evaluation (Signed)
Physical Therapy Evaluation Patient Details Name: Mike [name] MRN: [id number] DOB: [birth year] Today's Date: 11/08/2020   History of Present Illness  55 y.o. male with medical history significant of stage IV non-small cell lung cancer, squamous cell carcinoma, followed by Dr. Earlie Server currently on palliative systemic chemotherapy with docetaxel and Cyramza, obesity, history of PEs,hyperlipidemia, diabetes mellitus, presents to the hospital with complaints of increasing back pain over the last several days, as well as shortness of breath.  Patient was admitted with acute hypoxemic respiratory failure in the setting of new onset pleural effusion and volume overload. stable T11 comp fx  Clinical Impression  Pt admitted with above diagnosis.  Pt SpO2 with brief initial drop to 86%, questionable wave pattern. SpO2=89-98% on RA otherwise. HR max 138 Recommend HHPT and HHOT, has DME   Pt currently with functional limitations due to the deficits listed below (see PT Problem List). Pt will benefit from skilled PT to increase their independence and safety with mobility to allow discharge to the venue listed below.       Follow Up Recommendations Home health PT;Other (comment) (HHOT)    Equipment Recommendations  None recommended by PT    Recommendations for Other Services       Precautions / Restrictions Precautions Precautions: Fall;Back Restrictions Weight Bearing Restrictions: No      Mobility  Bed Mobility Overal bed mobility: Needs Assistance Bed Mobility: Supine to Sit;Sit to Supine     Supine to sit: Min guard Sit to supine: Min assist   General bed mobility comments: incr time, reliance on bed rails to come to sit. assist to lift LLE into bed, difficulty with log roll to L side d/t L shoulder pain    Transfers Overall transfer level: Needs assistance Equipment used: Rolling walker (2 wheeled) Transfers: Sit to/from [facility] Sit to Stand: Min  assist;Min guard;From elevated surface Stand pivot transfers: Min guard;Min assist       General transfer comment: light assist to rise and transition to RW, light assist to steady with turn to [facility]  Ambulation/Gait                Stairs            Wheelchair Mobility    Modified Rankin (Stroke Patients Only)       Balance Overall balance assessment: Needs assistance Sitting-balance support: No upper extremity supported;Feet supported Sitting balance-Leahy Scale: Fair     Standing balance support: Bilateral upper extremity supported Standing balance-Leahy Scale: Poor Standing balance comment: reliant on UEs                             Pertinent Vitals/Pain Pain Assessment: Faces Faces Pain Scale: Hurts little more Pain Location: L shoulder and back Pain Descriptors / Indicators: Grimacing Pain Intervention(s): Premedicated before session;Monitored during session;Limited activity within patient's tolerance;Repositioned    Home Living Family/patient expects to be discharged to:: Private residence Living Arrangements: Spouse/significant other;Other relatives Available Help at Discharge: Family;Available 24 hours/day Type of Home: Mobile home Home Access: Ramped entrance Entrance Stairs-Rails: Right;Left Entrance Stairs-Number of Steps: 4 Home Layout: One level Home Equipment: Cane - quad;Bedside commode;Adaptive equipment;Walker - 2 wheels;Hospital bed;Wheelchair - manual      Prior Function Level of Independence: Needs assistance   Gait / Transfers Assistance Needed: amb with RW until back injury, pt reports decr amb since then  ADL's / Homemaking Assistance Needed: Pt reports typically Modified Independent but  since injury, occasionally needs assist with donning pants over feet        Hand Dominance        Extremity/Trunk Assessment   Upper Extremity Assessment Upper Extremity Assessment: LUE deficits/detail;Defer to OT  evaluation    Lower Extremity Assessment Lower Extremity Assessment: Generalized weakness       Communication   Communication: No difficulties  Cognition Arousal/Alertness: Awake/alert Behavior During Therapy: WFL for tasks assessed/performed Overall Cognitive Status: Within Functional Limits for tasks assessed                                        General Comments      Exercises     Assessment/Plan    PT Assessment  (possible d/c today)  PT Problem List Decreased strength;Decreased mobility;Decreased activity tolerance;Decreased balance;Decreased knowledge of use of DME;Pain;Decreased range of motion       PT Treatment Interventions DME instruction;Therapeutic activities;Functional mobility training;Gait training;Therapeutic exercise;Patient/family education    PT Goals (Current goals can be found in the Care Plan section)  Acute Rehab PT Goals Patient Stated Goal: home soon PT Goal Formulation: With patient Time For Goal Achievement: 11/15/20 Potential to Achieve Goals: Good    Frequency Min 3X/week   Barriers to discharge        Co-evaluation               AM-PAC PT "6 Clicks" Mobility  Outcome Measure Help needed turning from your back to your side while in a flat bed without using bedrails?: A Little Help needed moving from lying on your back to sitting on the side of a flat bed without using bedrails?: A Little Help needed moving to and from a bed to a chair (including a wheelchair)?: A Little Help needed standing up from a chair using your arms (e.g., wheelchair or bedside chair)?: A Little Help needed to walk in hospital room?: A Little Help needed climbing 3-5 steps with a railing? : A Lot 6 Click Score: 17    End of Session Equipment Utilized During Treatment: Gait belt Activity Tolerance: Patient tolerated treatment well Patient left: Other (comment);with call bell/phone within reach Nurse Communication: Mobility status PT  Visit Diagnosis: Other abnormalities of gait and mobility (R26.89);Muscle weakness (generalized) (M62.81)    Time: 1017-1040 PT Time Calculation (min) (ACUTE ONLY): 23 min   Charges:   PT Evaluation $PT Eval Low Complexity: 1 Low PT Treatments $Therapeutic Activity: 8-22 mins        Baxter Flattery, PT  Acute Rehab Dept ([place]) [phone] Pager [phone]  11/08/2020   [name] 11/08/2020, 11:09 AM

## 2020-11-08 NOTE — Progress Notes (Signed)
SATURATION QUALIFICATIONS: (This note is used to comply with regulatory documentation for home oxygen)  Patient Saturations on Room Air at Rest = 86%  Patient Saturations on Room Air while Ambulating = 96%  Patient Saturations on 2 Liters of oxygen while rest=94%  Please briefly explain why patient needs home oxygen to maintain adequate O2 saturations.

## 2020-11-08 NOTE — TOC Initial Note (Signed)
Transition of Care [facility]) - Initial/Assessment Note    Patient Details  Name: Mike [name] MRN: [id number] Date of Birth: 1966/03/27  Transition of Care [facility]) CM/SW Contact:    Joaquin Courts, RN Phone Number: 11/08/2020, 4:20 PM  Clinical Narrative:                 Patient set up with [facility] for HHPT/OT.  [employer] to provide home oxygen.  DME shower stool is not covered by patient's insurance per dme rep and would have a 65$ out of pocket cost.  PT was made aware of this and at this time wants to hold off on ordering shower stool.  Patient made aware that should he change his mind, he can notify his bedside RN prior to dc.  Expected Discharge Plan: [place] Barriers to Discharge: No Barriers Identified   Patient Goals and CMS Choice Patient states their goals for this hospitalization and ongoing recovery are:: to go home with therapy CMS Medicare.gov Compare Post Acute Care list provided to:: Patient Choice offered to / list presented to : Patient  Expected Discharge Plan and Services Expected Discharge Plan: [place]   Discharge Planning Services: CM Consult Post Acute Care Choice: [place] arrangements for the past 2 months: Single Family Home Expected Discharge Date: 11/08/20               DME Arranged: Oxygen DME Agency: Other - Comment [facility]) Date DME Agency Contacted: 11/08/20 Time DME Agency Contacted: 1620 Representative spoke with at DME Agency: Brenton Grills HH Arranged: PT,OT [place]: [place] Date [place]: 11/08/20 Time [place]: 69 Representative spoke with at [place]: Malachy Mood  Prior Living Arrangements/Services Living arrangements for the past 2 months: [place] with:: Siblings Patient language and need for interpreter reviewed:: Yes Do you feel safe going back to the place where you live?: Yes      Need for Family Participation in Patient  Care: Yes (Comment) Care giver support system in place?: Yes (comment)   Criminal Activity/Legal Involvement Pertinent to Current Situation/Hospitalization: No - Comment as needed  Activities of Daily Living Home Assistive Devices/Equipment: Cane (specify quad or straight),Eyeglasses,Walker (specify type),Sock aid,Reacher,Nebulizer,Other (Comment) (walk-in shower, quad cane, front wheeled walker) ADL Screening (condition at time of admission) Patient's cognitive ability adequate to safely complete daily activities?: Yes Is the patient deaf or have difficulty hearing?: No Does the patient have difficulty seeing, even when wearing glasses/contacts?: No Does the patient have difficulty concentrating, remembering, or making decisions?: No Patient able to express need for assistance with ADLs?: Yes Does the patient have difficulty dressing or bathing?: Yes Independently performs ADLs?: No Communication: Independent Dressing (OT): Needs assistance Is this a change from baseline?: Change from baseline, expected to last <3days Grooming: Independent Feeding: Independent Bathing: Needs assistance Is this a change from baseline?: Change from baseline, expected to last <3 days Toileting: Needs assistance Is this a change from baseline?: Change from baseline, expected to last <3 days In/Out Bed: Needs assistance Is this a change from baseline?: Change from baseline, expected to last <3 days Walks in Home: Needs assistance Is this a change from baseline?: Change from baseline, expected to last <3 days Does the patient have difficulty walking or climbing stairs?: Yes (secondary to shortness of breath and weakness) Weakness of Legs: Both Weakness of Arms/Hands: None  Permission Sought/Granted  Emotional Assessment Appearance:: Appears stated age Attitude/Demeanor/Rapport: Engaged Affect (typically observed): Accepting Orientation: : Oriented to Self,Oriented to  Place,Oriented to  Time,Oriented to Situation   Psych Involvement: No (comment)  Admission diagnosis:  Pleural effusion [J90] Hypoxia [R09.02] 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 Pressure injury of skin 11/08/2020  . Dyspnea and respiratory abnormalities 11/07/2020  . Pleural effusion 11/06/2020  . Acute pain of left shoulder 10/09/2020  . Leg swelling 10/08/2020  . Cancer related pain 10/08/2020  . Femoral fracture ([place]) 07/24/2020  . Impending pathologic fracture 07/22/2020  . Situational depression 02/22/2020  . UTI due to extended-spectrum beta lactamase (ESBL) producing Escherichia coli 02/10/2020  . Abnormal thyroid function test 02/10/2020  . Port-A-Cath in place 01/29/2020  . Acute pulmonary embolism ([place]) 01/20/2020  . Stage IV squamous cell carcinoma of right lung ([place]) 12/24/2019  . Goals of care, counseling/discussion 12/24/2019  . Encounter for antineoplastic chemotherapy 12/24/2019  . Encounter for antineoplastic immunotherapy 12/24/2019  . Rib pain on right side 12/12/2019  . Bony metastasis ([place]) 12/12/2019  . Fatty liver 12/05/2019  . Encounter for well adult exam with abnormal findings 11/25/2019  . Chest pain 11/25/2019  . GERD (gastroesophageal reflux disease) 11/25/2019  . Dyslipidemia associated with type 2 diabetes mellitus ([place]) 11/25/2019  . Ex-smoker 09/15/2019  . OSA (obstructive sleep apnea) 09/15/2019  . Severe obesity (BMI 35.0-39.9) with comorbidity ([place]) 09/04/2019  . Erectile dysfunction 10/18/2014  . Type 2 diabetes mellitus with other specified complication ([place]) 21/97/5883   PCP:  [name], MD Pharmacy:   [id number] [place], [place] [address] [place] [address] Phone: [phone] Fax: [phone]  [place], [address]  [place] [address] Phone: [phone] Fax: [phone]     Social Determinants of Health (SDOH) Interventions    Readmission Risk Interventions No flowsheet data found.

## 2020-11-08 NOTE — Progress Notes (Signed)
   11/08/20 1115  PT Visit Information  Assistance Needed +1 NT assisted with peri-care. PT assisted pt with return to bed, declined chair d/t back pain  History of Present Illness 55 y.o. male with medical history significant of stage IV non-small cell lung cancer, squamous cell carcinoma, followed by Dr. Earlie Server currently on palliative systemic chemotherapy with docetaxel and Cyramza, obesity, history of PEs,hyperlipidemia, diabetes mellitus, presents to the hospital with complaints of increasing back pain over the last several days, as well as shortness of breath.  Patient was admitted with acute hypoxemic respiratory failure in the setting of new onset pleural effusion and volume overload. stable T11 comp fx  Subjective Data  Patient Stated Goal home soon  Precautions  Precautions Fall;Back  Pain Assessment  Faces Pain Scale 6  Pain Location L shoulder and back  Pain Descriptors / Indicators Grimacing  Cognition  Arousal/Alertness Awake/alert  Behavior During Therapy WFL for tasks assessed/performed  Overall Cognitive Status Within Functional Limits for tasks assessed  Bed Mobility  Bed Mobility Sit to Supine  Sit to supine Min assist  General bed mobility comments assist to lift LLE into bed, difficulty with log roll to L side d/t L shoulder pain  Transfers  Overall transfer level Needs assistance  Equipment used Rolling walker (2 wheeled)  Transfers Sit to/from [employer] Transfers  Sit to Stand Min assist;Min guard  Stand pivot transfers Min guard;Min assist  General transfer comment light assist to rise and transition to RW, light assist to steady with turn to [facility]  Ambulation/Gait  General Gait Details deferred d/t pain  Balance  Standing balance-Leahy Scale Poor  Standing balance comment reliant on UEs  PT - End of Session  Equipment Utilized During Treatment Gait belt  Activity Tolerance Patient tolerated treatment well  Patient left in bed;with call bell/phone  within reach;with bed alarm set  Nurse Communication Mobility status   PT - Assessment/Plan  PT Plan Current plan remains appropriate  PT Visit Diagnosis Other abnormalities of gait and mobility (R26.89);Muscle weakness (generalized) (M62.81)  PT Frequency (ACUTE ONLY) Min 3X/week  Follow Up Recommendations Home health PT;Other (comment)  PT equipment None recommended by PT  AM-PAC PT "6 Clicks" Mobility Outcome Measure (Version 2)  Help needed turning from your back to your side while in a flat bed without using bedrails? 3  Help needed moving from lying on your back to sitting on the side of a flat bed without using bedrails? 3  Help needed moving to and from a bed to a chair (including a wheelchair)? 3  Help needed standing up from a chair using your arms (e.g., wheelchair or bedside chair)? 3  Help needed to walk in hospital room? 3  Help needed climbing 3-5 steps with a railing?  2  6 Click Score 17  Consider Recommendation of Discharge To: Home with [facility]  PT Goal Progression  Progress towards PT goals Progressing toward goals  Acute Rehab PT Goals  PT Goal Formulation With patient  Time For Goal Achievement 11/15/20  Potential to Achieve Goals Good  PT Time Calculation  PT Start Time (ACUTE ONLY) 1050  PT Stop Time (ACUTE ONLY) 1100  PT Time Calculation (min) (ACUTE ONLY) 10 min  PT Treatments  $Therapeutic Activity 8-22 mins

## 2020-11-10 ENCOUNTER — Telehealth: Payer: Self-pay | Admitting: Physician Assistant

## 2020-11-10 ENCOUNTER — Other Ambulatory Visit: Payer: Self-pay | Admitting: *Deleted

## 2020-11-10 ENCOUNTER — Telehealth: Payer: Self-pay | Admitting: Family Medicine

## 2020-11-10 ENCOUNTER — Other Ambulatory Visit: Payer: Self-pay | Admitting: Physician Assistant

## 2020-11-10 DIAGNOSIS — C3491 Malignant neoplasm of unspecified part of right bronchus or lung: Secondary | ICD-10-CM

## 2020-11-10 LAB — BODY FLUID CULTURE: Culture: NO GROWTH

## 2020-11-10 LAB — CYTOLOGY - NON PAP

## 2020-11-10 NOTE — Telephone Encounter (Signed)
I received a message from [employer] regarding this patient. I called the patient to clarify what his DME needs are as well as wishes regarding palliative care/Hospice. I will place an order for [facility] Palliative care so they can discuss their services as well as hospice, should the patient decide to go that route in the future. I also placed a DME order for a bariatric beside commode as well as a shower chair. Per the most recent hospital admission note, they mentioned the patient had a hospital bed already at home. I was unable to reach the patient when I called to clarify about the hospital bed. The patient has an appointment to see Dr. Julien Nordmann later this week and for cycle #3 of his treatment. He is going to keep that appointment as planned.

## 2020-11-10 NOTE — Patient Outreach (Signed)
[facility]) Care Management  [place]  11/10/2020   Mike [name] 1966-06-03 [id number]   Noted that member admitted to hospital 1/20-1/22 for pleural effusion.  Call placed to follow up on discharge, state he is doing "much better."  State he never had any shortness of breath, was having abdominal pain.  Report he was glad he went to be evaluated because he had not clue of the severity of the effusion.  State he had over a liter of fluid removed while hospitalized.  He is now on home O2, denies any issues, breathing well.  Taking 40 mg Lasix daily for a week, will decrease to 20 mg daily.  Confirms he has been in contact with Care Guide for community resources, noted that she is getting referrals for resources.  Pain is still controlled, now taking Percocet 10-325 along with Fentanyl patch.  Denies any urgent concerns, encouraged to contact this care manager with questions.    Encounter Medications:  Outpatient Encounter Medications as of 11/10/2020  Medication Sig  . albuterol (PROVENTIL) (2.5 MG/3ML) 0.083% nebulizer solution Take 3 mLs (2.5 mg total) by nebulization every 6 (six) hours as needed for wheezing or shortness of breath.  [name] atorvastatin (LIPITOR) 20 MG tablet TAKE 1/2 TABLET BY MOUTH DAILY (Patient taking differently: Take 10 mg by mouth daily.)  . dexamethasone (DECADRON) 4 MG tablet Take 2 tablets TWICE a day the day before, the day of, and the day after chemotherapy.  . diazepam (VALIUM) 5 MG tablet Take 1 tablet (5 mg total) by mouth every 12 (twelve) hours as needed for anxiety or muscle spasms.  [name] docusate sodium (COLACE) 100 MG capsule Take 1 capsule (100 mg total) by mouth 2 (two) times daily.  [name] ELIQUIS 5 MG TABS tablet TAKE 1 TABLET BY MOUTH TWICE A DAY (Patient taking differently: Take 5 mg by mouth 2 (two) times daily.)  . fentaNYL (DURAGESIC) 100 MCG/HR Place 1 patch onto the skin every 3 (three) days for 3 days.  . furosemide (LASIX) 20  MG tablet Take 2 tablets (40 mg total) by mouth daily for 7 days, THEN 1 tablet (20 mg total) daily.  [name] loratadine (CLARITIN) 10 MG tablet Take 10 mg by mouth daily.  . magic mouthwash SOLN Take 5 mLs by mouth 4 (four) times daily.  . metFORMIN (GLUCOPHAGE) 1000 MG tablet Take 1 tablet (1,000 mg total) by mouth 2 (two) times daily with a meal.  . methocarbamol (ROBAXIN) 500 MG tablet Take 1 tablet (500 mg total) by mouth every 6 (six) hours as needed for muscle spasms.  [name] oxyCODONE-acetaminophen (PERCOCET) 10-325 MG tablet Take 1 tablet by mouth every 6 (six) hours as needed for up to 5 days for pain.  [name] senna-docusate (SENOKOT-S) 8.6-50 MG tablet Take 2 tablets by mouth 2 (two) times daily as needed for mild constipation.  . sildenafil (REVATIO) 20 MG tablet Take 1-5 tablets (20-100 mg total) by mouth daily as needed (ED). (Patient taking differently: Take 40 mg by mouth daily as needed (ED).)  . ZIEXTENZO 6 MG/0.6ML injection Inject 6 mg into the skin every 21 ( twenty-one) days.   No facility-administered encounter medications on file as of 11/10/2020.    Functional Status:  In your present state of health, do you have any difficulty performing the following activities: 11/06/2020  Hearing? N  Vision? N  Difficulty concentrating or making decisions? N  Walking or climbing stairs? Y  Comment secondary to shortness of breath  and weakness  Dressing or bathing? Y  Doing errands, shopping? Y  Some recent data might be hidden    Fall/Depression Screening: Fall Risk  08/08/2020  Falls in the past year? 0  Number falls in past yr: 0  Injury with Fall? 0  Risk for fall due to : Impaired balance/gait;Impaired mobility  Follow up Education provided;Falls prevention discussed   PHQ 2/9 Scores 08/08/2020 07/29/2020 11/23/2019 09/04/2019  PHQ - 2 Score 0 0 0 0  PHQ- 9 Score - - - 3    Assessment:  Goals Addressed            This Visit's Progress   . [facility] - Keep Pain Under Control   On  track    Follow Up Date 10/29/2020  Timeframe:  Long-Range Goal Priority:  High Start Date:      10/15/2020                       Expected End Date:        12/15/2020                 - develop a personal pain management plan - keep track of prescription refills - learn relaxation techniques - plan exercise or activity when pain is best controlled    Why is this important?   Day-to-day life can be hard when you have pain.  Even a small change in emotion or a physical problem can make pain better or worse.  Coping with pain depends on how the mind and body reacts to pain.  Pain medicine is just one piece of the treatment puzzle.  There are many tools to help manage pain. A combination of them can be used to best meet your needs.     Notes:   11/22 - completed - report pain in manageable  12/29 - Discussed increase in pain meds (Fentanyl patch), will request increase in breakthrough pain meds    . [facility] - Make and Keep All Appointments   On track    Follow Up Date 10/29/2020  Timeframe:  Short-Term Goal Priority:  Medium Start Date:        10/15/2020                     Expected End Date:          11/15/2020               - ask family or friend for a ride - call to cancel if needed - keep a calendar with appointment dates    Why is this important?   Part of staying healthy is seeing the doctor for follow-up care.  If you forget your appointments, there are some things you can do to stay on track.    Notes:  Referral to CSW for transportation  11/22 - upcoming appointments reviewed    . [facility] - Manage Fatigue (Tiredness)   On track    Follow Up Date 10/29/2020  Timeframe:  Short-Term Goal Priority:  Medium Start Date:       10/15/2020                      Expected End Date:     11/15/2020                    - eat healthy - maintain healthy weight - use meditation or relaxation techniques    Why  is this important?   Cancer treatment and its side effects can drain your  energy. It can keep you from doing things you would like to do.  There are many things that you can do to manage fatigue.    Notes:        Plan:  Follow-up:  Patient agrees to Care Plan and Follow-up.  Will follow up within the next 2 weeks as planned.  Valente David, [place], MSN [place] [phone]

## 2020-11-10 NOTE — Telephone Encounter (Signed)
   Telephone encounter was:  Successful.  11/10/2020 Name: Mike [name] MRN: [id number] DOB: 04-14-1966  Mike [name] is a 55 y.o. year old male who is a primary care patient of [name], MD . The community resource team was consulted for assistance with Financial Difficulties related to needing more help in his home since his cancer has spread.  Care guide performed the following interventions: Investigation of community resources performed Discussed resources to assist with in home care, dme, in home services.  Follow up call placed to community resources to determine status of patients referral Follow up call placed to the patient to discuss status of referral Spoke to pt and explained that after reviewing his discharge notes with Vernie Shanks one of the social workers in oncology with Dr. Worthy Flank office, that he was ordered [facility] through the hospital for  [facility] and should be getting a call sometime soon to set that up. I explained that [facility] is not in network with his insurance and it would be an out of pocket expense to receive HH. I also explained that he should talk more in depth to his oncologist about his needs and how his ADL's have really changed in his upcoming appointment on the 27th. He said that the PA has already called him today and ordered some of the equipment that he needs for his home. I let pt know that if something wasn't covered then he can call us back and we will find him a free dme substitute. .  Follow Up Plan:  Client will discuss his needs with his oncologist and find the right program to help him with is ADL's in his home moving forward.  and No further follow up planned at this time. The patient has been provided with needed resources.  [place], Care Management Phone: [phone] Email: [email or web address]

## 2020-11-10 NOTE — Telephone Encounter (Signed)
   Telephone encounter was:  Successful.  11/10/2020 Name: Mike [name] MRN: [id number] DOB: [birth year]  Mike [name] is a 55 y.o. year old male who is a primary care patient of [name], MD . The community resource team was consulted for assistance with Caregiver Stress and Financial Difficulties related to caring for himself because his cancer has spread.    Care guide performed the following interventions: Patient provided with information about care guide support team and interviewed to confirm resource needs Discussed resources to assist with in home care. [name]'s cancer has spread to his bones he said and he said that he has 3/4 more treatments to go through before this round of chemo is over. Pt is requesting information on Home Health, Palliatative care and even information on Hospice care. [facility] is who he is interested in for these items. He is also requesting a referral for DME equipment for a larger bedside commode, a shower chair and a possible hospital bed.  Obtained verbal consent to place patient referral to [facility] if they will take his insurance. He cannot afford out of pocket in home care at this time. He also authorized me to request the previous mentioned services from his primary doctor.  Placed referral to [place] via email to [facility] the owner. .  Follow Up Plan:  Care guide will follow up with patient by phone over the next week. As well as reach out to his current Education officer, museum and his PCP.   [place], Care Management Phone: [phone] Email: [email or web address]

## 2020-11-11 ENCOUNTER — Other Ambulatory Visit: Payer: 59

## 2020-11-11 ENCOUNTER — Ambulatory Visit: Payer: 59 | Admitting: Internal Medicine

## 2020-11-11 ENCOUNTER — Ambulatory Visit: Payer: 59

## 2020-11-11 ENCOUNTER — Telehealth: Payer: Self-pay | Admitting: Adult Health Nurse Practitioner

## 2020-11-11 NOTE — Telephone Encounter (Signed)
Spoke with patient regarding Palliative referral & services and all questions were answered and he was in agreement with scheduling visit with NP.  I have scheduled an In-person Consult for 11/24/20 @ 11 AM.

## 2020-11-12 ENCOUNTER — Telehealth: Payer: Self-pay

## 2020-11-12 NOTE — Telephone Encounter (Signed)
Agree with verbal orders.  I have not seen him recently, unsure how bad debility has become.  Ok to try ambulating but always with assistive device and would urge extreme caution

## 2020-11-12 NOTE — Telephone Encounter (Signed)
Dwyane Luo with [facility] left v/m requesting verbal orders for [facility] PT 1 x a wk for 8 wks and a VO for skilled nurse eval for evaluation of pressure ulcer on sacrum. Also Mickel Baas request Dr Synthia Innocent insight if any precautions to follow for pt and is it safe for pt to begin ambulation; pt wants to try to ambulate if Dr Darnell Level thinks safe. Per Mickel Baas pt has hx of several pathological fxs by pt just reaching and twisting. Mickel Baas request cb.

## 2020-11-13 ENCOUNTER — Other Ambulatory Visit: Payer: Self-pay

## 2020-11-13 ENCOUNTER — Inpatient Hospital Stay: Payer: 59

## 2020-11-13 ENCOUNTER — Encounter: Payer: Self-pay | Admitting: Internal Medicine

## 2020-11-13 ENCOUNTER — Inpatient Hospital Stay (HOSPITAL_BASED_OUTPATIENT_CLINIC_OR_DEPARTMENT_OTHER): Payer: 59 | Admitting: Internal Medicine

## 2020-11-13 VITALS — BP 110/68 | HR 96 | Temp 97.0°F | Resp 18 | Ht 72.0 in | Wt 241.3 lb

## 2020-11-13 DIAGNOSIS — K219 Gastro-esophageal reflux disease without esophagitis: Secondary | ICD-10-CM | POA: Diagnosis not present

## 2020-11-13 DIAGNOSIS — G893 Neoplasm related pain (acute) (chronic): Secondary | ICD-10-CM | POA: Diagnosis not present

## 2020-11-13 DIAGNOSIS — Z5112 Encounter for antineoplastic immunotherapy: Secondary | ICD-10-CM | POA: Diagnosis not present

## 2020-11-13 DIAGNOSIS — E7800 Pure hypercholesterolemia, unspecified: Secondary | ICD-10-CM | POA: Diagnosis not present

## 2020-11-13 DIAGNOSIS — K760 Fatty (change of) liver, not elsewhere classified: Secondary | ICD-10-CM | POA: Diagnosis not present

## 2020-11-13 DIAGNOSIS — C3491 Malignant neoplasm of unspecified part of right bronchus or lung: Secondary | ICD-10-CM

## 2020-11-13 DIAGNOSIS — E119 Type 2 diabetes mellitus without complications: Secondary | ICD-10-CM | POA: Diagnosis not present

## 2020-11-13 DIAGNOSIS — Z5111 Encounter for antineoplastic chemotherapy: Secondary | ICD-10-CM | POA: Diagnosis not present

## 2020-11-13 DIAGNOSIS — M25512 Pain in left shoulder: Secondary | ICD-10-CM | POA: Diagnosis not present

## 2020-11-13 DIAGNOSIS — Z923 Personal history of irradiation: Secondary | ICD-10-CM | POA: Diagnosis not present

## 2020-11-13 DIAGNOSIS — K589 Irritable bowel syndrome without diarrhea: Secondary | ICD-10-CM | POA: Diagnosis not present

## 2020-11-13 DIAGNOSIS — Z79899 Other long term (current) drug therapy: Secondary | ICD-10-CM | POA: Diagnosis not present

## 2020-11-13 DIAGNOSIS — F17210 Nicotine dependence, cigarettes, uncomplicated: Secondary | ICD-10-CM | POA: Diagnosis not present

## 2020-11-13 DIAGNOSIS — Z7984 Long term (current) use of oral hypoglycemic drugs: Secondary | ICD-10-CM | POA: Diagnosis not present

## 2020-11-13 DIAGNOSIS — Z7901 Long term (current) use of anticoagulants: Secondary | ICD-10-CM | POA: Diagnosis not present

## 2020-11-13 DIAGNOSIS — C7951 Secondary malignant neoplasm of bone: Secondary | ICD-10-CM | POA: Diagnosis not present

## 2020-11-13 DIAGNOSIS — G4733 Obstructive sleep apnea (adult) (pediatric): Secondary | ICD-10-CM | POA: Diagnosis not present

## 2020-11-13 DIAGNOSIS — C3411 Malignant neoplasm of upper lobe, right bronchus or lung: Secondary | ICD-10-CM | POA: Diagnosis not present

## 2020-11-13 LAB — CMP (CANCER CENTER ONLY)
ALT: <6 U/L (ref 0–44)
AST: 11 U/L — ABNORMAL LOW (ref 15–41)
Albumin: 2.4 g/dL — ABNORMAL LOW (ref 3.5–5.0)
Alkaline Phosphatase: 84 U/L (ref 38–126)
Anion gap: 8 (ref 5–15)
BUN: 12 mg/dL (ref 6–20)
CO2: 29 mmol/L (ref 22–32)
Calcium: 8.7 mg/dL — ABNORMAL LOW (ref 8.9–10.3)
Chloride: 100 mmol/L (ref 98–111)
Creatinine: 0.64 mg/dL (ref 0.61–1.24)
GFR, Estimated: >60 mL/min (ref >60)
Glucose, Bld: 179 mg/dL — ABNORMAL HIGH (ref 70–99)
Potassium: 4.4 mmol/L (ref 3.5–5.1)
Sodium: 137 mmol/L (ref 135–145)
Total Bilirubin: <0.2 mg/dL — ABNORMAL LOW (ref 0.3–1.2)
Total Protein: 6.2 g/dL — ABNORMAL LOW (ref 6.5–8.1)

## 2020-11-13 LAB — CBC WITH DIFFERENTIAL (CANCER CENTER ONLY)
Abs Immature Granulocytes: 0.1 10*3/uL — ABNORMAL HIGH (ref 0.00–0.07)
Basophils Absolute: 0 10*3/uL (ref 0.0–0.1)
Basophils Relative: 0 %
Eosinophils Absolute: 0 10*3/uL (ref 0.0–0.5)
Eosinophils Relative: 0 %
HCT: 31.4 % — ABNORMAL LOW (ref 39.0–52.0)
Hemoglobin: 9.8 g/dL — ABNORMAL LOW (ref 13.0–17.0)
Immature Granulocytes: 1 %
Lymphocytes Relative: 4 %
Lymphs Abs: 0.8 10*3/uL (ref 0.7–4.0)
MCH: 29.2 pg (ref 26.0–34.0)
MCHC: 31.2 g/dL (ref 30.0–36.0)
MCV: 93.5 fL (ref 80.0–100.0)
Monocytes Absolute: 1.1 10*3/uL — ABNORMAL HIGH (ref 0.1–1.0)
Monocytes Relative: 6 %
Neutro Abs: 15.9 10*3/uL — ABNORMAL HIGH (ref 1.7–7.7)
Neutrophils Relative %: 89 %
Platelet Count: 295 10*3/uL (ref 150–400)
RBC: 3.36 MIL/uL — ABNORMAL LOW (ref 4.22–5.81)
RDW: 22.2 % — ABNORMAL HIGH (ref 11.5–15.5)
WBC Count: 17.9 10*3/uL — ABNORMAL HIGH (ref 4.0–10.5)
nRBC: 0 % (ref 0.0–0.2)

## 2020-11-13 LAB — TOTAL PROTEIN, URINE DIPSTICK: Protein, ur: <30 mg/dL

## 2020-11-13 MED ORDER — SODIUM CHLORIDE 0.9 % IV SOLN
10.0000 mg | Freq: Once | INTRAVENOUS | Status: AC
  Administered 2020-11-13: 10 mg via INTRAVENOUS
  Filled 2020-11-13: qty 10

## 2020-11-13 MED ORDER — ACETAMINOPHEN 325 MG PO TABS
650.0000 mg | ORAL_TABLET | Freq: Once | ORAL | Status: AC
  Administered 2020-11-13: 650 mg via ORAL

## 2020-11-13 MED ORDER — OXYCODONE-ACETAMINOPHEN 10-325 MG PO TABS: 1.0000 | ORAL_TABLET | Freq: Three times a day (TID) | ORAL | 0 refills | Status: DC | PRN

## 2020-11-13 MED ORDER — SODIUM CHLORIDE 0.9 % IV SOLN
10.0000 mg/kg | Freq: Once | INTRAVENOUS | Status: AC
  Administered 2020-11-13: 1100 mg via INTRAVENOUS
  Filled 2020-11-13: qty 10

## 2020-11-13 MED ORDER — SODIUM CHLORIDE 0.9 % IV SOLN
75.0000 mg/m2 | Freq: Once | INTRAVENOUS | Status: AC
  Administered 2020-11-13: 180 mg via INTRAVENOUS
  Filled 2020-11-13: qty 18

## 2020-11-13 MED ORDER — DIPHENHYDRAMINE HCL 50 MG/ML IJ SOLN
50.0000 mg | Freq: Once | INTRAMUSCULAR | Status: AC
  Administered 2020-11-13: 50 mg via INTRAVENOUS

## 2020-11-13 MED ORDER — ACETAMINOPHEN 325 MG PO TABS
ORAL_TABLET | ORAL | Status: AC
  Filled 2020-11-13: qty 2

## 2020-11-13 MED ORDER — SODIUM CHLORIDE 0.9 % IV SOLN
Freq: Once | INTRAVENOUS | Status: AC
  Filled 2020-11-13: qty 250

## 2020-11-13 NOTE — Progress Notes (Signed)
Pt discharged in no apparent distress. Pt left ambulatory without assistance. Pt aware of discharge instructions and verbalized understanding and had no further questions.  

## 2020-11-13 NOTE — Telephone Encounter (Signed)
Spoke with Mickel Baas informing her Dr. Darnell Level is giving verbal orders for services requested.  I also relayed Dr. Synthia Innocent message.  Mickel Baas verbalizes understanding.

## 2020-11-13 NOTE — Progress Notes (Signed)
Ok to proceed without urine today Per Dr. Julien Nordmann

## 2020-11-13 NOTE — Progress Notes (Signed)
[place] [phone]   [phone]  OFFICE PROGRESS NOTE  [name], MD [place] [address]  DIAGNOSIS: Stage IV (T2b, N3, M1C) non-small cell lung cancer, squamous cell carcinoma presented with right upper lobe lung mass in addition to mediastinal and right supraclavicular lymphadenopathy as well as multiple metastatic bone lesions diagnosed in March 2021.  PRIOR THERAPY: 1)Palliative radiotherapy to the painful metastatic bone lesions under the care of Dr. Sondra Come. Last dose 01/14/20 2)Palliative systemic chemotherapy withcarboplatin for AUC of 5, paclitaxel 175 NG/M2 and Keytruda 200 mg IV every 3 weeks with Neulasta support.Last dose on11/23/21. Status post9cycles.Starting from cycle #5 he will be on maintenance single agent Keytruda  CURRENT THERAPY: Palliative systemic chemotherapy with docetaxel 75 mg/m2 and Cyramza 10 mg/kg IV every 3 weeks with Neulasta support. First dose expected on 10/03/20.Status post 2 cycles  INTERVAL HISTORY: Mike [name] 55 y.o. male returns to the clinic today for follow-up visit.  The patient is feeling fine today with no concerning complaints except for the fatigue and shortness of breath with exertion.  He also continues to have pain on the left shoulder as well as the left hip area.  He was admitted to the hospital more than a week ago with the pain on the left hip area and imaging studies including CT scan of the chest, abdomen pelvis showed increase in the right pleural effusion.  The patient underwent ultrasound-guided thoracentesis with drainage of 1.1 L of fluid.  He is feeling a little bit better today.  He lost few pounds since his last visit.  He denied having any current chest pain, cough or hemoptysis.  He has no nausea, vomiting, diarrhea but has constipation and he is using Colace.  The patient is here today for evaluation before starting cycle #3.  MEDICAL  HISTORY: Past Medical History:  Diagnosis Date  . Arthritis    neck  . Cancer, metastatic to bone ([facility]) dx'd 10/2019  . Diabetes ([facility])   . Fatty liver 12/05/2019   By [place] 11/2019  . GERD (gastroesophageal reflux disease)   . 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 High cholesterol   . IBS (irritable bowel syndrome)   . lung ca dx'd 10/2019   lung stage 4   . OSA (obstructive sleep apnea) 09/15/2019   Sleep study 12/2017 - severe OSA with AHI 65.6, desat to 76% rec CPAP  . Severe obesity (BMI 35.0-39.9) with comorbidity ([facility]) 09/04/2019    ALLERGIES:  has No Known Allergies.  MEDICATIONS:  Current Outpatient Medications  Medication Sig Dispense Refill  . albuterol (PROVENTIL) (2.5 MG/3ML) 0.083% nebulizer solution Take 3 mLs (2.5 mg total) by nebulization every 6 (six) hours as needed for wheezing or shortness of breath. 75 mL 12  . atorvastatin (LIPITOR) 20 MG tablet TAKE 1/2 TABLET BY MOUTH DAILY (Patient taking differently: Take 10 mg by mouth daily.) 45 tablet 0  . dexamethasone (DECADRON) 4 MG tablet Take 2 tablets TWICE a day the day before, the day of, and the day after chemotherapy. 60 tablet 2  . diazepam (VALIUM) 5 MG tablet Take 1 tablet (5 mg total) by mouth every 12 (twelve) hours as needed for anxiety or muscle spasms. 30 tablet 0  . docusate sodium (COLACE) 100 MG capsule Take 1 capsule (100 mg total) by mouth 2 (two) times daily. 10 capsule 0  . ELIQUIS 5 MG TABS tablet TAKE  1 TABLET BY MOUTH TWICE A DAY (Patient taking differently: Take 5 mg by mouth 2 (two) times daily.) 60 tablet 3  . furosemide (LASIX) 20 MG tablet Take 2 tablets (40 mg total) by mouth daily for 7 days, THEN 1 tablet (20 mg total) daily. 97 tablet 0  . loratadine (CLARITIN) 10 MG tablet Take 10 mg by mouth daily.    . magic mouthwash SOLN Take 5 mLs by mouth 4 (four) times daily. 240 mL 0  . metFORMIN (GLUCOPHAGE) 1000 MG tablet Take 1 tablet (1,000 mg total) by mouth 2 (two) times daily with a  meal. 180 tablet 0  . methocarbamol (ROBAXIN) 500 MG tablet Take 1 tablet (500 mg total) by mouth every 6 (six) hours as needed for muscle spasms. 60 tablet 6  . oxyCODONE-acetaminophen (PERCOCET) 10-325 MG tablet Take 1 tablet by mouth every 6 (six) hours as needed for up to 5 days for pain. 20 tablet 0  . senna-docusate (SENOKOT-S) 8.6-50 MG tablet Take 2 tablets by mouth 2 (two) times daily as needed for mild constipation.    . sildenafil (REVATIO) 20 MG tablet Take 1-5 tablets (20-100 mg total) by mouth daily as needed (ED). (Patient taking differently: Take 40 mg by mouth daily as needed (ED).) 30 tablet 3  . ZIEXTENZO 6 MG/0.6ML injection Inject 6 mg into the skin every 21 ( twenty-one) days.     No current facility-administered medications for this visit.    SURGICAL HISTORY:  Past Surgical History:  Procedure Laterality Date  . BIOPSY  12/18/2019   Procedure: BIOPSY;  Surgeon: Garner Nash, DO;  Location: [facility];  Service: Pulmonary;;  . BRONCHIAL BRUSHINGS  12/18/2019   Procedure: BRONCHIAL BRUSHINGS;  Surgeon: Garner Nash, DO;  Location: [facility];  Service: Pulmonary;;  . BRONCHIAL WASHINGS  12/18/2019   Procedure: BRONCHIAL WASHINGS;  Surgeon: Garner Nash, DO;  Location: [facility];  Service: Pulmonary;;  . COLONOSCOPY  10/2019   mult TA, int hem, rpt 1 yr ([facility])  . CYSTECTOMY     knee- left knee cyst  . ENDOBRONCHIAL ULTRASOUND  12/18/2019   Procedure: ENDOBRONCHIAL ULTRASOUND;  Surgeon: Garner Nash, DO;  Location: [facility];  Service: Pulmonary;;  . FINE NEEDLE ASPIRATION  12/18/2019   Procedure: FINE NEEDLE ASPIRATION;  Surgeon: Garner Nash, DO;  Location: [facility];  Service: Pulmonary;;  . INTRAMEDULLARY (IM) NAIL INTERTROCHANTERIC Left 07/25/2020   Procedure: LEFT INTERTROCHANTERIC INTRAMEDULLARY (IM) NAIL;  Surgeon: Leandrew Koyanagi, MD;  Location: [facility];  Service: Orthopedics;  Laterality: Left;  . IR IMAGING GUIDED PORT INSERTION   12/27/2019  . PILONIDAL CYST EXCISION    . VIDEO BRONCHOSCOPY WITH ENDOBRONCHIAL ULTRASOUND Right 12/18/2019   Procedure: VIDEO BRONCHOSCOPY;  Surgeon: Garner Nash, DO;  Location: [facility];  Service: Pulmonary;  Laterality: Right;    REVIEW OF SYSTEMS:  Constitutional: positive for fatigue and weight loss Eyes: negative Ears, nose, mouth, throat, and face: negative Respiratory: positive for dyspnea on exertion Cardiovascular: negative Gastrointestinal: positive for constipation Genitourinary:negative Integument/breast: negative Hematologic/lymphatic: negative Musculoskeletal:positive for bone pain Neurological: negative Behavioral/Psych: negative Endocrine: negative Allergic/Immunologic: negative   PHYSICAL EXAMINATION: General appearance: alert, cooperative, fatigued and no distress Head: Normocephalic, without obvious abnormality, atraumatic Neck: no adenopathy, no JVD, supple, symmetrical, trachea midline and thyroid not enlarged, symmetric, no tenderness/mass/nodules Lymph nodes: Cervical, supraclavicular, and axillary nodes normal. Resp: clear to auscultation bilaterally Back: symmetric, no curvature. ROM normal. No CVA tenderness. Cardio: regular rate and rhythm,  S1, S2 normal, no murmur, click, rub or gallop GI: soft, non-tender; bowel sounds normal; no masses,  no organomegaly Extremities: extremities normal, atraumatic, no cyanosis or edema Neurologic: Alert and oriented X 3, normal strength and tone. Normal symmetric reflexes. Normal coordination and gait  ECOG PERFORMANCE STATUS: 1 - Symptomatic but completely ambulatory  Blood pressure 110/68, pulse 96, temperature (!) 97 F (36.1 C), temperature source Tympanic, resp. rate 18, height 6' (1.829 m), weight 241 lb 4.8 oz (109.5 kg), SpO2 97 %.  LABORATORY DATA: Lab Results  Component Value Date   WBC 11.0 (H) 11/08/2020   HGB 10.2 (L) 11/08/2020   HCT 32.7 (L) 11/08/2020   MCV 93.7 11/08/2020   PLT 241  11/08/2020      Chemistry      Component Value Date/Time   NA 137 11/08/2020 1029   K 3.5 11/08/2020 1029   CL 95 (L) 11/08/2020 1029   CO2 31 11/08/2020 1029   BUN 8 11/08/2020 1029   CREATININE 0.57 (L) 11/08/2020 1029   CREATININE 0.59 (L) 10/23/2020 1116      Component Value Date/Time   CALCIUM 8.5 (L) 11/08/2020 1029   ALKPHOS 98 11/07/2020 0354   AST 17 11/07/2020 0354   AST 16 10/23/2020 1116   ALT 12 11/07/2020 0354   ALT 16 10/23/2020 1116   BILITOT 0.5 11/07/2020 0354   BILITOT 0.4 10/23/2020 1116       RADIOGRAPHIC STUDIES: DG Chest 1 View  Result Date: 11/07/2020 CLINICAL DATA:  Status post thoracentesis. EXAM: CHEST  1 VIEW COMPARISON:  11/06/2020 FINDINGS: There is a left chest wall port a catheter with tip in the projection of the cavoatrial junction. Normal heart size. Decreased volume of right pleural effusion status post thoracentesis. No significant pneumothorax identified. Similar appearance of right upper lobe cavitary lung lesion measuring approximately 4 cm. IMPRESSION: 1. Decreased volume of right pleural effusion status post thoracentesis. 2. No pneumothorax. Electronically Signed   By: Kerby Moors M.D.   On: 11/07/2020 12:13   CT Angio Chest PE W/Cm &/Or Wo Cm  Result Date: 11/06/2020 CLINICAL DATA:  Stage IV right lung cancer. History of pulmonary embolism. Worsening left abdominal pain and diarrhea for few days. Hypoxia. EXAM: CT ANGIOGRAPHY CHEST CT ABDOMEN AND PELVIS WITH CONTRAST TECHNIQUE: Multidetector CT imaging of the chest was performed using the standard protocol during bolus administration of intravenous contrast. Multiplanar CT image reconstructions and MIPs were obtained to evaluate the vascular anatomy. Multidetector CT imaging of the abdomen and pelvis was performed using the standard protocol during bolus administration of intravenous contrast. CONTRAST:  130mL OMNIPAQUE IOHEXOL 350 MG/ML SOLN COMPARISON:  10/10/2020 chest CT angiogram  and CT abdomen/pelvis. FINDINGS: CTA CHEST FINDINGS Cardiovascular: The study is low to moderate quality for the evaluation of pulmonary embolism, with significant motion degradation. Chronic eccentric small nonocclusive bilateral pulmonary emboli in the peripheral right pulmonary artery (series 5/image 132) and segmental left lower lobe pulmonary artery branches (series 5/image 133), unchanged. Atherosclerotic nonaneurysmal thoracic aorta. Top-normal caliber main pulmonary artery (3.3 cm diameter). Normal heart size. Stable trace pericardial effusion/thickening. Left internal jugular Port-A-Cath terminates at the cavoatrial junction. Mediastinum/Nodes: No discrete thyroid nodules. Unremarkable esophagus. Mildly enlarged 1.0 cm left supraclavicular node (series 4/image 11), stable. No axillary adenopathy. Mildly enlarged 1.0 cm right hilar node (series 4/image 43), stable. No left hilar adenopathy. Mildly enlarged 1.0 cm AP window node (series 4/image 32), stable. Lungs/Pleura: No pneumothorax. Moderate dependent right pleural effusion largely new. No left  pleural effusion. Cavitary irregular 4.5 x 2.8 cm posterior right upper lobe lung mass (series 6/image 49), previously 4.5 x 2.7 cm using similar measurement technique, stable. Passive atelectasis in the dependent right lower lobe. No new significant pulmonary nodules. Musculoskeletal: Expansile lytic lesion in the medial left clavicle measures 5.4 cm (series 6/image 19), increased from 4.7 cm on 10/10/2020 CT. Lytic 2.2 cm left humeral head lesion (series 6/image 6), increased from 1.8 cm. Stable lytic sternal lesion. Stable lytic expansile 5.6 cm right T11 posterior elements lesion (series 6/image 131) with stable mild superior T11 vertebral compression fracture. No new focal osseous lesions in the chest. Moderate thoracic spondylosis. Mild gynecomastia asymmetric to the right, stable. Review of the MIP images confirms the above findings. CT ABDOMEN and PELVIS  FINDINGS Hepatobiliary: Three scattered hypodense right liver masses, stable to mildly increased. Segment 7 right liver 1.3 cm mass (series 1/image 15), increased from 0.8 cm. Inferior right liver 4.1 cm mass adjacent to the gallbladder (series 1/image 28), slightly increased from 3.8 cm. Far inferior right liver 3.3 cm mass (series 1/image 37), stable. Normal gallbladder with no radiopaque cholelithiasis. No biliary ductal dilatation. Pancreas: Normal, with no mass or duct dilation. Spleen: Normal size. No mass. Adrenals/Urinary Tract: Normal adrenals. Normal kidneys with no hydronephrosis and no renal mass. Normal bladder. Stomach/Bowel: Normal non-distended stomach. Normal caliber small bowel with no small bowel wall thickening. Normal appendix. Moderate colonic stool. No large bowel wall thickening, diverticulosis or significant pericolonic fat stranding. Mild presacral space fat stranding and ill-defined fluid is not significantly changed and is nonspecific. Vascular/Lymphatic: Atherosclerotic abdominal aorta with ectatic 2.8 cm infrarenal abdominal aorta, stable. Patent portal, splenic, hepatic and renal veins. No pathologically enlarged lymph nodes in the abdomen or pelvis. Reproductive: Normal size prostate. Other: No pneumoperitoneum, ascites or focal fluid collection. Musculoskeletal: Numerous lytic lesions throughout the pelvis including right upper and lower left sacrum, medial left iliac bone, right iliac wing and posterior right acetabulum, not appreciably changed since 10/10/2020 CT. No new focal osseous lesions. Mild lumbar spondylosis. Review of the MIP images confirms the above findings. IMPRESSION: 1. Limited motion degraded scan. No evidence of acute pulmonary embolism. Chronic eccentric small nonocclusive bilateral pulmonary emboli, unchanged. 2. Cavitary posterior right upper lobe lung mass, stable. 3. Moderate dependent right pleural effusion, largely new. 4. Widespread lytic osseous  metastases throughout the axial and proximal appendicular skeleton, stable to mildly increased since 10/10/2020 CT as detailed. 5. Right liver metastases are stable to mildly increased. 6. Moderate colonic stool volume, suggesting constipation. No evidence of bowel obstruction or acute bowel inflammation. 7. Stable ectatic 2.8 cm infrarenal abdominal aorta. Recommend follow-up ultrasound every 5 years. This recommendation follows ACR consensus guidelines: White Paper of the ACR Incidental Findings Committee II on Vascular Findings. J Am Coll Radiol 2013; 10:789-794. 8. Aortic Atherosclerosis (ICD10-I70.0). Electronically Signed   By: Ilona Sorrel M.D.   On: 11/06/2020 11:51   CT Abdomen Pelvis W Contrast  Result Date: 11/06/2020 CLINICAL DATA:  Stage IV right lung cancer. History of pulmonary embolism. Worsening left abdominal pain and diarrhea for few days. Hypoxia. EXAM: CT ANGIOGRAPHY CHEST CT ABDOMEN AND PELVIS WITH CONTRAST TECHNIQUE: Multidetector CT imaging of the chest was performed using the standard protocol during bolus administration of intravenous contrast. Multiplanar CT image reconstructions and MIPs were obtained to evaluate the vascular anatomy. Multidetector CT imaging of the abdomen and pelvis was performed using the standard protocol during bolus administration of intravenous contrast. CONTRAST:  123mL OMNIPAQUE IOHEXOL  350 MG/ML SOLN COMPARISON:  10/10/2020 chest CT angiogram and CT abdomen/pelvis. FINDINGS: CTA CHEST FINDINGS Cardiovascular: The study is low to moderate quality for the evaluation of pulmonary embolism, with significant motion degradation. Chronic eccentric small nonocclusive bilateral pulmonary emboli in the peripheral right pulmonary artery (series 5/image 132) and segmental left lower lobe pulmonary artery branches (series 5/image 133), unchanged. Atherosclerotic nonaneurysmal thoracic aorta. Top-normal caliber main pulmonary artery (3.3 cm diameter). Normal heart size.  Stable trace pericardial effusion/thickening. Left internal jugular Port-A-Cath terminates at the cavoatrial junction. Mediastinum/Nodes: No discrete thyroid nodules. Unremarkable esophagus. Mildly enlarged 1.0 cm left supraclavicular node (series 4/image 11), stable. No axillary adenopathy. Mildly enlarged 1.0 cm right hilar node (series 4/image 43), stable. No left hilar adenopathy. Mildly enlarged 1.0 cm AP window node (series 4/image 32), stable. Lungs/Pleura: No pneumothorax. Moderate dependent right pleural effusion largely new. No left pleural effusion. Cavitary irregular 4.5 x 2.8 cm posterior right upper lobe lung mass (series 6/image 49), previously 4.5 x 2.7 cm using similar measurement technique, stable. Passive atelectasis in the dependent right lower lobe. No new significant pulmonary nodules. Musculoskeletal: Expansile lytic lesion in the medial left clavicle measures 5.4 cm (series 6/image 19), increased from 4.7 cm on 10/10/2020 CT. Lytic 2.2 cm left humeral head lesion (series 6/image 6), increased from 1.8 cm. Stable lytic sternal lesion. Stable lytic expansile 5.6 cm right T11 posterior elements lesion (series 6/image 131) with stable mild superior T11 vertebral compression fracture. No new focal osseous lesions in the chest. Moderate thoracic spondylosis. Mild gynecomastia asymmetric to the right, stable. Review of the MIP images confirms the above findings. CT ABDOMEN and PELVIS FINDINGS Hepatobiliary: Three scattered hypodense right liver masses, stable to mildly increased. Segment 7 right liver 1.3 cm mass (series 1/image 15), increased from 0.8 cm. Inferior right liver 4.1 cm mass adjacent to the gallbladder (series 1/image 28), slightly increased from 3.8 cm. Far inferior right liver 3.3 cm mass (series 1/image 37), stable. Normal gallbladder with no radiopaque cholelithiasis. No biliary ductal dilatation. Pancreas: Normal, with no mass or duct dilation. Spleen: Normal size. No mass.  Adrenals/Urinary Tract: Normal adrenals. Normal kidneys with no hydronephrosis and no renal mass. Normal bladder. Stomach/Bowel: Normal non-distended stomach. Normal caliber small bowel with no small bowel wall thickening. Normal appendix. Moderate colonic stool. No large bowel wall thickening, diverticulosis or significant pericolonic fat stranding. Mild presacral space fat stranding and ill-defined fluid is not significantly changed and is nonspecific. Vascular/Lymphatic: Atherosclerotic abdominal aorta with ectatic 2.8 cm infrarenal abdominal aorta, stable. Patent portal, splenic, hepatic and renal veins. No pathologically enlarged lymph nodes in the abdomen or pelvis. Reproductive: Normal size prostate. Other: No pneumoperitoneum, ascites or focal fluid collection. Musculoskeletal: Numerous lytic lesions throughout the pelvis including right upper and lower left sacrum, medial left iliac bone, right iliac wing and posterior right acetabulum, not appreciably changed since 10/10/2020 CT. No new focal osseous lesions. Mild lumbar spondylosis. Review of the MIP images confirms the above findings. IMPRESSION: 1. Limited motion degraded scan. No evidence of acute pulmonary embolism. Chronic eccentric small nonocclusive bilateral pulmonary emboli, unchanged. 2. Cavitary posterior right upper lobe lung mass, stable. 3. Moderate dependent right pleural effusion, largely new. 4. 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 5. Right liver metastases are stable to mildly increased. 6. Moderate colonic stool volume, suggesting constipation. No evidence of bowel obstruction or acute bowel inflammation. 7. Stable ectatic 2.8 cm infrarenal abdominal  aorta. Recommend follow-up ultrasound every 5 years. This recommendation follows ACR consensus guidelines: White Paper of the ACR Incidental Findings Committee II on Vascular  Findings. J Am Coll Radiol 2013; 10:789-794. 8. Aortic Atherosclerosis (ICD10-I70.0). Electronically Signed   By: Ilona Sorrel M.D.   On: 11/06/2020 11:51   DG Chest Port 1 View  Result Date: 11/06/2020 CLINICAL DATA:  Shortness of breath. EXAM: PORTABLE CHEST 1 VIEW COMPARISON:  January 20, 2020. FINDINGS: The heart size and mediastinal contours are within normal limits. No pneumothorax is noted. Left internal jugular Port-A-Cath is unchanged in position. Left lung is clear. Right upper lobe opacity is enlarged concerning for enlarging mass. Increased right lower lobe opacity is noted concerning for atelectasis with associated effusion. The visualized skeletal structures are unremarkable. IMPRESSION: Right upper lobe opacity is enlarged concerning for enlarging mass. Increased right lower lobe opacity is noted concerning for atelectasis or pneumonia with associated effusion. Electronically Signed   By: Marijo Conception M.D.   On: 11/06/2020 13:07   ECHOCARDIOGRAM COMPLETE  Result Date: 11/07/2020    ECHOCARDIOGRAM REPORT   Patient Name:   Mike [name] [facility] Date of Exam: 11/07/2020 Medical Rec #:  [id number]       Height:       71.0 in Accession #:    [id number]      Weight:       246.6 lb Date of Birth:  December 06, 1965       BSA:          2.305 m Patient Age:    23 years        BP:           138/91 mmHg Patient Gender: M               HR:           96 bpm. Exam Location:  Inpatient Procedure: 2D Echo, Cardiac Doppler and Color Doppler Indications:    CHF  History:        Patient has prior history of Echocardiogram examinations, most                 recent 01/20/2020. Signs/Symptoms:Shortness of Breath and Pleural                 effusion; Risk Factors:Dyslipidemia, Diabetes and Obesity.                 Metastaic lung cancer, chemo.  Sonographer:    Dustin Flock Referring Phys: [place]  Sonographer Comments: Technically difficult study due to poor echo windows and patient is morbidly obese. Image  acquisition challenging due to respiratory motion. Difficult images due to lung cancer and patient unable to roll onto left side. IMPRESSIONS  1. Endocardial border definition is poor. However, systolic function appears to be normal. Left ventricular ejection fraction, by estimation, is 55 to 60%. The left ventricle has normal function. The left ventricle has no regional wall motion abnormalities. Left ventricular diastolic parameters are consistent with Grade I diastolic dysfunction (impaired relaxation).  2. Right ventricular systolic function is normal. The right ventricular size is normal.  3. The mitral valve is normal in structure. No evidence of mitral valve regurgitation. No evidence of mitral stenosis.  4. The aortic valve is normal in structure. Aortic valve regurgitation is not visualized. No aortic stenosis is present.  5. The inferior vena cava is dilated in size with >50% respiratory variability, suggesting right atrial pressure of 8 mmHg. FINDINGS  Left Ventricle: Endocardial border definition is poor. However, systolic function appears to be normal. Left ventricular ejection fraction, by estimation, is 55 to 60%. The left ventricle has normal function. The left ventricle has no regional wall motion abnormalities. The left ventricular internal cavity size was normal in size. There is no left ventricular hypertrophy. Left ventricular diastolic parameters are consistent with Grade I diastolic dysfunction (impaired relaxation). Normal left ventricular filling pressure. Right Ventricle: The right ventricular size is normal. No increase in right ventricular wall thickness. Right ventricular systolic function is normal. Left Atrium: Left atrial size was normal in size. Right Atrium: Right atrial size was normal in size. Pericardium: There is no evidence of pericardial effusion. Mitral Valve: The mitral valve is normal in structure. No evidence of mitral valve regurgitation. No evidence of mitral valve  stenosis. Tricuspid Valve: The tricuspid valve is normal in structure. Tricuspid valve regurgitation is not demonstrated. No evidence of tricuspid stenosis. Aortic Valve: The aortic valve is normal in structure. Aortic valve regurgitation is not visualized. No aortic stenosis is present. Pulmonic Valve: The pulmonic valve was normal in structure. Pulmonic valve regurgitation is not visualized. No evidence of pulmonic stenosis. Aorta: The aortic root is normal in size and structure. Venous: The inferior vena cava is dilated in size with greater than 50% respiratory variability, suggesting right atrial pressure of 8 mmHg. IAS/Shunts: No atrial level shunt detected by color flow Doppler.  LEFT VENTRICLE PLAX 2D LVIDd:         3.40 cm  Diastology LVIDs:         2.90 cm  LV e' medial:    9.20 cm/s LV PW:         1.30 cm  LV E/e' medial:  6.2 LV IVS:        1.40 cm  LV e' lateral:   6.53 cm/s LVOT diam:     2.30 cm  LV E/e' lateral: 8.7 LV SV:         59 LV SV Index:   26 LVOT Area:     4.15 cm  RIGHT VENTRICLE RV Basal diam:  3.30 cm RV S prime:     15.10 cm/s TAPSE (M-mode): 2.5 cm LEFT ATRIUM             Index       RIGHT ATRIUM           Index LA diam:        3.60 cm 1.56 cm/m  RA Area:     19.90 cm LA Vol (A2C):   41.6 ml 18.05 ml/m RA Volume:   62.30 ml  27.03 ml/m LA Vol (A4C):   66.0 ml 28.64 ml/m LA Biplane Vol: 54.2 ml 23.52 ml/m  AORTIC VALVE LVOT Vmax:   93.10 cm/s LVOT Vmean:  62.200 cm/s LVOT VTI:    0.142 m  AORTA Ao Root diam: 3.50 cm MITRAL VALVE MV Area (PHT): 7.66 cm     SHUNTS MV Decel Time: 99 msec      Systemic VTI:  0.14 m MV E velocity: 56.80 cm/s   Systemic Diam: 2.30 cm MV A velocity: 109.00 cm/s MV E/A ratio:  0.52 Skeet Latch MD Electronically signed by Skeet Latch MD Signature Date/Time: 11/07/2020/12:02:36 PM    Final    US THORACENTESIS ASP PLEURAL SPACE W/IMG GUIDE  Result Date: 11/07/2020 INDICATION: Patient with history of stage IV lung cancer, back/shoulder pain,  pulmonary emboli, right pleural effusion. Request received for diagnostic and therapeutic right  thoracentesis. EXAM: ULTRASOUND GUIDED DIAGNOSTIC AND THERAPEUTIC RIGHT THORACENTESIS MEDICATIONS: 1% lidocaine to skin and subcutaneous tissue COMPLICATIONS: None immediate. PROCEDURE: An ultrasound guided thoracentesis was thoroughly discussed with the patient and questions answered. The benefits, risks, alternatives and complications were also discussed. The patient understands and wishes to proceed with the procedure. Written consent was obtained. Ultrasound was performed to localize and mark an adequate pocket of fluid in the right chest. The area was then prepped and draped in the normal sterile fashion. 1% Lidocaine was used for local anesthesia. Under ultrasound guidance a 6 Fr Safe-T-Centesis catheter was introduced. Thoracentesis was performed. The catheter was removed and a dressing applied. FINDINGS: A total of approximately 1.1 liters of yellow fluid was removed. Samples were sent to the laboratory as requested by the clinical team. IMPRESSION: Successful ultrasound guided diagnostic and therapeutic right thoracentesis yielding 1.1 liters of pleural fluid. Read by: Rowe Robert, PA-C Electronically Signed   By: Sandi Mariscal M.D.   On: 11/07/2020 11:37    ASSESSMENT AND PLAN: This is a very pleasant 55 years old white male with a stage IV non-small cell lung cancer, squamous cell carcinoma presented with right upper lobe lung mass in addition to mediastinal and right supraclavicular lymphadenopathy and multiple metastatic bone lesions diagnosed in March 2021 status post palliative radiotherapy to the painful metastatic lesions under the care of Dr. Sondra Come. The patient is currently undergoing palliative systemic chemotherapy initially with carboplatin, paclitaxel and Keytruda for 4 cycles and he is currently on the maintenance phase of his treatment with single agent Keytruda status post 5 more cycles.  This  treatment was discontinued secondary to disease progression. The patient is currently undergoing second line chemotherapy with docetaxel 75 mg/M2 and Cyramza 10 mg/KG every 3 weeks with Neulasta support status post 2 cycles. He is tolerating his chemotherapy fairly well with no significant adverse effects. He had repeat CT scan of the chest, abdomen pelvis performed recently.  I personally and independently reviewed the scan images and discussed the results with the patient today. His scan showed a stable to mild increase and one of the liver lesion as well as bone metastasis. I recommended for the patient to continue his current treatment with docetaxel and Cyramza as planned. He will proceed with cycle #3 today. He will come back for follow-up visit in 3 weeks for evaluation before the next cycle of his treatment. For pain management I will give him refill of his pain medication today. For the constipation, he will continue with Colace as well as MiraLAX. The patient was advised to call immediately if he has any other concerning symptoms in the interval. The patient voices understanding of current disease status and treatment options and is in agreement with the current care plan.  All questions were answered. The patient knows to call the clinic with any problems, questions or concerns. We can certainly see the patient much sooner if necessary.  Disclaimer: This note was dictated with voice recognition software. Similar sounding words can inadvertently be transcribed and may not be corrected upon review.

## 2020-11-13 NOTE — Patient Instructions (Signed)
Steps to Quit Smoking Smoking tobacco is the leading cause of preventable death. It can affect almost every organ in the body. Smoking puts you and people around you at risk for many serious, long-lasting (chronic) diseases. Quitting smoking can be hard, but it is one of the best things that you can do for your health. It is never too late to quit. How do I get ready to quit? When you decide to quit smoking, make a plan to help you succeed. Before you quit:  Pick a date to quit. Set a date within the next 2 weeks to give you time to prepare.  Write down the reasons why you are quitting. Keep this list in places where you will see it often.  Tell your family, friends, and co-workers that you are quitting. Their support is important.  Talk with your doctor about the choices that may help you quit.  Find out if your health insurance will pay for these treatments.  Know the people, places, things, and activities that make you want to smoke (triggers). Avoid them. What first steps can I take to quit smoking?  Throw away all cigarettes at home, at work, and in your car.  Throw away the things that you use when you smoke, such as ashtrays and lighters.  Clean your car. Make sure to empty the ashtray.  Clean your home, including curtains and carpets. What can I do to help me quit smoking? Talk with your doctor about taking medicines and seeing a counselor at the same time. You are more likely to succeed when you do both.  If you are pregnant or breastfeeding, talk with your doctor about counseling or other ways to quit smoking. Do not take medicine to help you quit smoking unless your doctor tells you to do so. To quit smoking: Quit right away  Quit smoking totally, instead of slowly cutting back on how much you smoke over a period of time.  Go to counseling. You are more likely to quit if you go to counseling sessions regularly. Take medicine You may take medicines to help you quit. Some  medicines need a prescription, and some you can buy over-the-counter. Some medicines may contain a drug called nicotine to replace the nicotine in cigarettes. Medicines may:  Help you to stop having the desire to smoke (cravings).  Help to stop the problems that come when you stop smoking (withdrawal symptoms). Your doctor may ask you to use:  Nicotine patches, gum, or lozenges.  Nicotine inhalers or sprays.  Non-nicotine medicine that is taken by mouth. Find resources Find resources and other ways to help you quit smoking and remain smoke-free after you quit. These resources are most helpful when you use them often. They include:  Online chats with a counselor.  Phone quitlines.  Printed self-help materials.  Support groups or group counseling.  Text messaging programs.  Mobile phone apps. Use apps on your mobile phone or tablet that can help you stick to your quit plan. There are many free apps for mobile phones and tablets as well as websites. Examples include Quit Guide from the CDC and smokefree.gov   What things can I do to make it easier to quit?  Talk to your family and friends. Ask them to support and encourage you.  Call a phone quitline (1-800-QUIT-NOW), reach out to support groups, or work with a counselor.  Ask people who smoke to not smoke around you.  Avoid places that make you want to smoke,   such as: ? Bars. ? Parties. ? Smoke-break areas at work.  Spend time with people who do not smoke.  Lower the stress in your life. Stress can make you want to smoke. Try these things to help your stress: ? Getting regular exercise. ? Doing deep-breathing exercises. ? Doing yoga. ? Meditating. ? Doing a body scan. To do this, close your eyes, focus on one area of your body at a time from head to toe. Notice which parts of your body are tense. Try to relax the muscles in those areas.   How will I feel when I quit smoking? Day 1 to 3 weeks Within the first 24 hours,  you may start to have some problems that come from quitting tobacco. These problems are very bad 2-3 days after you quit, but they do not often last for more than 2-3 weeks. You may get these symptoms:  Mood swings.  Feeling restless, nervous, angry, or annoyed.  Trouble concentrating.  Dizziness.  Strong desire for high-sugar foods and nicotine.  Weight gain.  Trouble pooping (constipation).  Feeling like you may vomit (nausea).  Coughing or a sore throat.  Changes in how the medicines that you take for other issues work in your body.  Depression.  Trouble sleeping (insomnia). Week 3 and afterward After the first 2-3 weeks of quitting, you may start to notice more positive results, such as:  Better sense of smell and taste.  Less coughing and sore throat.  Slower heart rate.  Lower blood pressure.  Clearer skin.  Better breathing.  Fewer sick days. Quitting smoking can be hard. Do not give up if you fail the first time. Some people need to try a few times before they succeed. Do your best to stick to your quit plan, and talk with your doctor if you have any questions or concerns. Summary  Smoking tobacco is the leading cause of preventable death. Quitting smoking can be hard, but it is one of the best things that you can do for your health.  When you decide to quit smoking, make a plan to help you succeed.  Quit smoking right away, not slowly over a period of time.  When you start quitting, seek help from your doctor, family, or friends. This information is not intended to replace advice given to you by your health care provider. Make sure you discuss any questions you have with your health care provider. Document Revised: 06/29/2019 Document Reviewed: 12/23/2018 Elsevier Patient Education  2021 Elsevier Inc.  

## 2020-11-13 NOTE — Patient Instructions (Signed)
Central Line, Adult A central line is a long, thin tube (catheter) that is put into a vein so that it goes to a large vein above your heart. It can be used to:  Give you medicine or fluids.  Give you food and nutrients.  Take blood or give you blood for testing or treatments. Types of central lines There are four main types of central lines:  Peripherally inserted central catheter (PICC) line. This type is usually put in the upper arm and goes up the arm to the heart.  Tunneled central line. This type is placed in a large vein in the neck, chest, or groin. It is tunneled under the skin and brought out through a second incision.  Non-tunneled central line. This type is used for a shorter time than other types, usually for 7 days at the most. It is inserted in the neck, chest, or groin.  Implanted port. This type can stay in place longer than other types of central lines. It is normally put in the upper chest but can also be placed in the upper arm or the belly. Surgery is needed to put it in and take it out. The type of central line you get will depend on how long you need it and your medical condition.   Tell a doctor about:  Any allergies you have.  All medicines you are taking. These include vitamins, herbs, eye drops, creams, and over-the-counter medicines.  Any problems you or family members have had with anesthetic medicines.  Any blood disorders you have.  Any surgeries you have had.  Any medical conditions you have.  Whether you are pregnant or may be pregnant. What are the risks? Generally, central lines are safe. However, problems may occur, including:  Infection.  A blood clot.  Bleeding from the place where the central line was inserted.  Getting a hole or crack in the central line. If this happens, the central line will need to be replaced.  Central line failure.  The catheter moving or coming out of place. What happens before the  procedure? Medicines  Ask your doctor about changing or stopping: ? Your normal medicines. ? Vitamins, herbs, and supplements. ? Over-the-counter medicines.  Do not take aspirin or ibuprofen unless you are told to. General instructions  Follow instructions from your doctor about eating or drinking.  For your safety, your doctor may: ? Mark the area of the procedure. ? Remove hair at the procedure site. ? Ask you to wash with a soap that kills germs.  Plan to have a responsible adult take you home from the hospital or clinic.  If you will be going home right after the procedure, plan to have a responsible adult care for you for the time you are told. This is important. What happens during the procedure?  An IV tube will be put into one of your veins.  You may be given: ? A sedative. This medicine helps you relax. ? Anesthetics. These medicines numb certain areas of your body.  Your skin will be cleaned with a germ-killing (antiseptic) solution. You may be covered with clean drapes.  Your blood pressure, heart rate, breathing rate, and blood oxygen level will be monitored during the procedure.  The central line will be put into the vein and moved through it to the correct spot. The doctor may use X-ray equipment to help guide the central line to the right place.  A bandage (dressing) will be placed over the insertion area.   The procedure may vary among doctors and hospitals. What can I expect after the procedure?  You will be monitored until you leave the hospital or clinic. This includes checking your blood pressure, heart rate, breathing rate, and blood oxygen level.  Caps may be placed on the ends of the central line tubing.  If you were given a sedative during your procedure, do not drive or use machines until your doctor says that it is safe. Follow these instructions at home: Caring for the tube  Follow instructions from your doctor about: ? Flushing the  tube. ? Cleaning the tube and the area around it.  Only use germ-free (sterile) supplies to flush. The supplies should be from your doctor, a pharmacy, or another place that your doctor recommends.  Before you flush the tube or clean the area around the tube: ? Wash your hands with soap and water for at least 20 seconds. If you cannot use soap and water, use hand sanitizer. ? Clean the central line hub with rubbing alcohol. To do this:  Scrub it using a twisting motion and rub for 10 to 15 seconds or for 30 twists. Follow the manufacturer's instructions.  Be sure you scrub the top of the hub, not just the sides. Never reuse alcohol pads.  Let the hub dry before use. Keep it from touching anything while drying.   Caring for your skin  Check the skin around the central line every day for signs of infection. Check for: ? Redness, swelling, or pain. ? Fluid or blood. ? Warmth. ? Pus or a bad smell.  Keep the area where the tube was put in clean and dry.  Change bandages only as told by your doctor.  Keep your bandage dry. If a bandage gets wet, have it changed right away. General instructions  Keep the tube clamped, unless it is being used.  If you or someone else accidentally pulls on the tube, make sure: ? The bandage is okay. ? There is no bleeding. ? The tube has not been pulled out.  Do not use scissors or sharp objects near the tube.  Do not take baths, swim, or use a hot tub until your doctor says it is okay. Ask your doctor if you may take showers. You may only be allowed to take sponge baths.  Ask your doctor what activities are safe for you. Your doctor may tell you not to lift anything or move your arm too much.  Take over-the-counter and prescription medicines only as told by your doctor.  Keep all follow-up visits. Storing and throwing away supplies  Keep your supplies in a clean, dry location.  Throw away any used syringes in a container that is only for  sharp items (sharps container). You can buy a sharps container from a pharmacy, or you can make one by using an empty hard plastic bottle with a cover.  Place any used bandages or infusion bags into a plastic bag. Throw that bag in the trash. Contact a doctor if:  You have any of these signs of infection where the tube was put in: ? Redness, swelling, or pain. ? Fluid or blood. ? Warmth. ? Pus or a bad smell. Get help right away if:  You have: ? A fever or chills. ? Shortness of breath. ? Pain in your chest. ? A fast heartbeat. ? Swelling in your neck, face, chest, or arm.  You feel dizzy or you faint.  There are red lines coming from   where the tube was put in.  The area where the tube was put in is bleeding and the bleeding will not stop.  Your tube is hard to flush.  You do not get a blood return from the tube.  The tube gets loose or comes out.  The tube has a hole or a tear.  The tube leaks. Summary  A central line is a long, thin tube (catheter) that is put in your vein. It can be used to give you medicine, food, or fluids.  Follow instructions from your doctor about flushing and cleaning the tube.  Keep the area where the tube was put in clean and dry.  Ask your doctor what activities are safe for you. This information is not intended to replace advice given to you by your health care provider. Make sure you discuss any questions you have with your health care provider. Document Revised: 06/05/2020 Document Reviewed: 06/05/2020 Elsevier Patient Education  2021 Elsevier Inc.  

## 2020-11-14 ENCOUNTER — Telehealth: Payer: Self-pay | Admitting: Family Medicine

## 2020-11-14 NOTE — Telephone Encounter (Signed)
Agree with this. Thank you.  

## 2020-11-14 NOTE — Telephone Encounter (Signed)
They went out to the house for skilled nursing evaluation. Patient refused to be seen due to diarrhea and not feeling well. She is needing a verbal order for a 1 week one to do a skilled nursing assessment. EM

## 2020-11-14 NOTE — Telephone Encounter (Signed)
Spoke with Mike [name] informing her Dr. Darnell Level is giving verbal order for services requested.

## 2020-11-16 ENCOUNTER — Other Ambulatory Visit: Payer: Self-pay | Admitting: Family Medicine

## 2020-11-17 ENCOUNTER — Other Ambulatory Visit: Payer: Self-pay | Admitting: Internal Medicine

## 2020-11-17 ENCOUNTER — Other Ambulatory Visit: Payer: Self-pay | Admitting: Medical Oncology

## 2020-11-17 ENCOUNTER — Other Ambulatory Visit: Payer: Self-pay | Admitting: Family Medicine

## 2020-11-17 ENCOUNTER — Telehealth: Payer: Self-pay | Admitting: Medical Oncology

## 2020-11-17 DIAGNOSIS — Z95828 Presence of other vascular implants and grafts: Secondary | ICD-10-CM

## 2020-11-17 MED ORDER — DIAZEPAM 5 MG PO TABS: 5.0000 mg | ORAL_TABLET | Freq: Two times a day (BID) | ORAL | 0 refills | Status: DC | PRN

## 2020-11-17 MED ORDER — FENTANYL 100 MCG/HR TD PT72: 1.0000 | MEDICATED_PATCH | TRANSDERMAL | 0 refills | Status: DC

## 2020-11-17 MED ORDER — LIDOCAINE-PRILOCAINE 2.5-2.5 % EX CREA: 1.0000 "application " | TOPICAL_CREAM | CUTANEOUS | 0 refills | Status: AC | PRN

## 2020-11-17 NOTE — Telephone Encounter (Signed)
ERx 

## 2020-11-17 NOTE — Telephone Encounter (Signed)
Pharmacy requests refill on: Diazepam 5 mg   LAST REFILL: 09/25/2020 (Q-30, R-0) LAST OV: 02/22/2020 NEXT OV: 12/12/2020 PHARMACY: [facility] [place], [place]

## 2020-11-17 NOTE — Telephone Encounter (Signed)
Refill fentanyl 100 mcg q 3 day and Emla cream ( fentanyl prescribed inpt -1 patch). [facility] [place]

## 2020-11-18 ENCOUNTER — Telehealth: Payer: Self-pay | Admitting: Internal Medicine

## 2020-11-18 ENCOUNTER — Telehealth: Payer: Self-pay | Admitting: Medical Oncology

## 2020-11-18 NOTE — Telephone Encounter (Signed)
Per 1/27 los, next appts scheduled

## 2020-11-18 NOTE — Telephone Encounter (Signed)
Neulasta not delivered -Neulasta did not come in the mail for home injection yesterday . He filed a complaint . The company told him it will arrive on Thursday .  Per Dr Julien Nordmann . I told Mike [name] to hold that injection and use it for next cycle Keep lab appt this Thursday and if he needs immune support , Julien Nordmann will order Granix or what insurance will cover.

## 2020-11-18 NOTE — Telephone Encounter (Signed)
Pharmacy requests refill on: Diazepam 5 mg   LAST REFILL: 11/17/2020 (Q-30, R-0) LAST OV: 02/22/2020 NEXT OV: 12/13/2019 PHARMACY: [facility] [place], [place]

## 2020-11-19 ENCOUNTER — Telehealth: Payer: Self-pay | Admitting: Family Medicine

## 2020-11-19 NOTE — Telephone Encounter (Signed)
Mike [name] called to get verbal orders for skilled nursing and 2x4w 1x4w

## 2020-11-19 NOTE — Telephone Encounter (Signed)
Agree with this.  

## 2020-11-20 ENCOUNTER — Other Ambulatory Visit: Payer: Self-pay

## 2020-11-20 ENCOUNTER — Telehealth: Payer: Self-pay | Admitting: Adult Health Nurse Practitioner

## 2020-11-20 ENCOUNTER — Inpatient Hospital Stay: Payer: 59 | Attending: Physician Assistant

## 2020-11-20 ENCOUNTER — Telehealth: Payer: Self-pay | Admitting: Medical Oncology

## 2020-11-20 ENCOUNTER — Other Ambulatory Visit: Payer: Self-pay | Admitting: Physician Assistant

## 2020-11-20 ENCOUNTER — Inpatient Hospital Stay: Payer: 59

## 2020-11-20 DIAGNOSIS — C3411 Malignant neoplasm of upper lobe, right bronchus or lung: Secondary | ICD-10-CM | POA: Insufficient documentation

## 2020-11-20 DIAGNOSIS — Z5112 Encounter for antineoplastic immunotherapy: Secondary | ICD-10-CM | POA: Insufficient documentation

## 2020-11-20 DIAGNOSIS — C7951 Secondary malignant neoplasm of bone: Secondary | ICD-10-CM | POA: Insufficient documentation

## 2020-11-20 DIAGNOSIS — C3491 Malignant neoplasm of unspecified part of right bronchus or lung: Secondary | ICD-10-CM

## 2020-11-20 DIAGNOSIS — Z923 Personal history of irradiation: Secondary | ICD-10-CM | POA: Insufficient documentation

## 2020-11-20 DIAGNOSIS — T451X5A Adverse effect of antineoplastic and immunosuppressive drugs, initial encounter: Secondary | ICD-10-CM

## 2020-11-20 DIAGNOSIS — Z95828 Presence of other vascular implants and grafts: Secondary | ICD-10-CM

## 2020-11-20 DIAGNOSIS — D701 Agranulocytosis secondary to cancer chemotherapy: Secondary | ICD-10-CM

## 2020-11-20 LAB — CMP (CANCER CENTER ONLY)
ALT: 14 U/L (ref 0–44)
AST: 21 U/L (ref 15–41)
Albumin: 2.4 g/dL — ABNORMAL LOW (ref 3.5–5.0)
Alkaline Phosphatase: 86 U/L (ref 38–126)
Anion gap: 9 (ref 5–15)
BUN: 5 mg/dL — ABNORMAL LOW (ref 6–20)
CO2: 30 mmol/L (ref 22–32)
Calcium: 9 mg/dL (ref 8.9–10.3)
Chloride: 97 mmol/L — ABNORMAL LOW (ref 98–111)
Creatinine: 0.55 mg/dL — ABNORMAL LOW (ref 0.61–1.24)
GFR, Estimated: >60 mL/min (ref >60)
Glucose, Bld: 93 mg/dL (ref 70–99)
Potassium: 4.1 mmol/L (ref 3.5–5.1)
Sodium: 136 mmol/L (ref 135–145)
Total Bilirubin: 0.6 mg/dL (ref 0.3–1.2)
Total Protein: 6.3 g/dL — ABNORMAL LOW (ref 6.5–8.1)

## 2020-11-20 LAB — CBC WITH DIFFERENTIAL (CANCER CENTER ONLY)
Abs Immature Granulocytes: 0.03 10*3/uL (ref 0.00–0.07)
Basophils Absolute: 0 10*3/uL (ref 0.0–0.1)
Basophils Relative: 2 %
Eosinophils Absolute: 0 10*3/uL (ref 0.0–0.5)
Eosinophils Relative: 1 %
HCT: 27.8 % — ABNORMAL LOW (ref 39.0–52.0)
Hemoglobin: 8.7 g/dL — ABNORMAL LOW (ref 13.0–17.0)
Immature Granulocytes: 2 %
Lymphocytes Relative: 34 %
Lymphs Abs: 0.4 10*3/uL — ABNORMAL LOW (ref 0.7–4.0)
MCH: 29.2 pg (ref 26.0–34.0)
MCHC: 31.3 g/dL (ref 30.0–36.0)
MCV: 93.3 fL (ref 80.0–100.0)
Monocytes Absolute: 0.3 10*3/uL (ref 0.1–1.0)
Monocytes Relative: 24 %
Neutro Abs: 0.5 10*3/uL — ABNORMAL LOW (ref 1.7–7.7)
Neutrophils Relative %: 37 %
Platelet Count: 197 10*3/uL (ref 150–400)
RBC: 2.98 MIL/uL — ABNORMAL LOW (ref 4.22–5.81)
RDW: 20.7 % — ABNORMAL HIGH (ref 11.5–15.5)
WBC Count: 1.3 10*3/uL — ABNORMAL LOW (ref 4.0–10.5)
nRBC: 0 % (ref 0.0–0.2)

## 2020-11-20 MED ORDER — ZARXIO 480 MCG/0.8ML IJ SOSY: PREFILLED_SYRINGE | INTRAMUSCULAR | 0 refills | Status: DC

## 2020-11-20 MED ORDER — HEPARIN SOD (PORK) LOCK FLUSH 100 UNIT/ML IV SOLN
500.0000 [IU] | Freq: Once | INTRAVENOUS | Status: AC
  Administered 2020-11-20: 500 [IU]
  Filled 2020-11-20: qty 5

## 2020-11-20 MED ORDER — SODIUM CHLORIDE 0.9% FLUSH
10.0000 mL | Freq: Once | INTRAVENOUS | Status: AC
  Administered 2020-11-20: 10 mL
  Filled 2020-11-20: qty 10

## 2020-11-20 NOTE — Patient Instructions (Signed)

## 2020-11-20 NOTE — Telephone Encounter (Signed)
Pt prescribed zarixo 480 for home injection. Called in verbal order to [facility], They will rush delivery.

## 2020-11-20 NOTE — Telephone Encounter (Signed)
Called patient to see if we could change the time for the Consult on 2/7 to 12:30 PM instead of 11 AM, left message with patient requesting a return call to let me know if this would be okay or not.

## 2020-11-20 NOTE — Telephone Encounter (Signed)
Spoke with Mike [name] informing her Dr. Darnell Level is giving verbal orders for OT.

## 2020-11-21 ENCOUNTER — Telehealth: Payer: Self-pay | Admitting: *Deleted

## 2020-11-21 DIAGNOSIS — Z86711 Personal history of pulmonary embolism: Secondary | ICD-10-CM

## 2020-11-21 DIAGNOSIS — M549 Dorsalgia, unspecified: Secondary | ICD-10-CM

## 2020-11-21 DIAGNOSIS — Z9981 Dependence on supplemental oxygen: Secondary | ICD-10-CM

## 2020-11-21 DIAGNOSIS — C7951 Secondary malignant neoplasm of bone: Secondary | ICD-10-CM

## 2020-11-21 DIAGNOSIS — E669 Obesity, unspecified: Secondary | ICD-10-CM

## 2020-11-21 DIAGNOSIS — Z6832 Body mass index (BMI) 32.0-32.9, adult: Secondary | ICD-10-CM

## 2020-11-21 DIAGNOSIS — C3402 Malignant neoplasm of left main bronchus: Secondary | ICD-10-CM

## 2020-11-21 DIAGNOSIS — I110 Hypertensive heart disease with heart failure: Secondary | ICD-10-CM

## 2020-11-21 DIAGNOSIS — L89150 Pressure ulcer of sacral region, unstageable: Secondary | ICD-10-CM

## 2020-11-21 DIAGNOSIS — I5033 Acute on chronic diastolic (congestive) heart failure: Secondary | ICD-10-CM

## 2020-11-21 DIAGNOSIS — Z7901 Long term (current) use of anticoagulants: Secondary | ICD-10-CM

## 2020-11-21 DIAGNOSIS — J9601 Acute respiratory failure with hypoxia: Secondary | ICD-10-CM

## 2020-11-21 DIAGNOSIS — E119 Type 2 diabetes mellitus without complications: Secondary | ICD-10-CM

## 2020-11-21 DIAGNOSIS — C787 Secondary malignant neoplasm of liver and intrahepatic bile duct: Secondary | ICD-10-CM

## 2020-11-21 DIAGNOSIS — G8929 Other chronic pain: Secondary | ICD-10-CM

## 2020-11-21 DIAGNOSIS — R532 Functional quadriplegia: Secondary | ICD-10-CM

## 2020-11-21 DIAGNOSIS — D630 Anemia in neoplastic disease: Secondary | ICD-10-CM

## 2020-11-21 DIAGNOSIS — Z7984 Long term (current) use of oral hypoglycemic drugs: Secondary | ICD-10-CM

## 2020-11-21 NOTE — Telephone Encounter (Signed)
Patient states he has not had a BM in 2 days. Has been using milk of magnesia, colace and just started senokot today. Has been drinking liquids today. Instructed to get Miralax and use according to directions on bottle. Also to repeat senokot 2 tablets today. Verbalized understanding

## 2020-11-24 ENCOUNTER — Other Ambulatory Visit: Payer: 59 | Admitting: Adult Health Nurse Practitioner

## 2020-11-24 ENCOUNTER — Other Ambulatory Visit: Payer: Self-pay

## 2020-11-24 ENCOUNTER — Telehealth: Payer: Self-pay | Admitting: Medical Oncology

## 2020-11-24 DIAGNOSIS — Z515 Encounter for palliative care: Secondary | ICD-10-CM

## 2020-11-24 DIAGNOSIS — C7951 Secondary malignant neoplasm of bone: Secondary | ICD-10-CM

## 2020-11-24 DIAGNOSIS — K5900 Constipation, unspecified: Secondary | ICD-10-CM

## 2020-11-24 DIAGNOSIS — C3491 Malignant neoplasm of unspecified part of right bronchus or lung: Secondary | ICD-10-CM

## 2020-11-24 NOTE — Progress Notes (Addendum)
[facility] Consult Note Telephone: [phone]  Fax: [phone]  PATIENT NAME: Mike [name] DOB: Mar 01, 1966 MRN: [id number]  PRIMARY CARE PROVIDER:   [name], MD  REFERRING PROVIDER:  [name], MD [place],  [place] [address]  RESPONSIBLE PARTY:   Self [phone] Mike [name], brother [place], Mike [name] [phone]  Chief complaint: initial palliative visit/pain       RECOMMENDATIONS and PLAN:  1.  Advanced care planning. Patient is DNR.  Patient states that wants to be DNR and had a copy that did not get sent back home with him.  Filled out new DNR, uploaded to [facility] and left original with patient.  Left blank MOST form for him and family to go over.  Will go over and fill out at future visit.  2.  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  First diagnosed in March 2021.  Has undergone chemo and radiation in the past.  Patient is currently undergoing chemo every 3 weeks.  Patient is being followed by Dr. Earlie Server with oncology.  Continue follow-up and recommendations with oncology  3.  Pain.  Patient has chronic pain related to cancer mets to the bone of multiple sites.  States getting adequate relief with fentanyl patch 100 mcg/h.  Does have Percocet as needed which he does not have to take very often.  Continue current pain regimen  4.  Constipation.  Patient is using Dulcolax, MiraLAX and is still working on balancing how often he needs to take this.  Did encourage that if it has been at least 3 days since his last bowel movement to take a dose of MiraLAX.  Continue to assess at each visit  5. Support.  Patient currently on long term disability through previous job and [employer] end in [place] of this year.  Has not started disability paperwork.  Did go over starting this.  He does have expensive medications.  Will put in SW referral to  help with any gaps in insurance benefits and transition to disability as this can take a long time.   Palliative will continue to monitor for symptom management/decline and make recommendations as needed.  Next appointment is in 8 weeks.  Encouraged to call with any questions or concerns   I spent 75 minutes providing this consultation, including time spent with patient/family, chart review, provider coordination, documentation. More than 50% of the time in this consultation was spent coordinating communication.   HISTORY OF PRESENT ILLNESS:  Mike [name] is a 55 y.o. 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 Palliative Care was asked to help address goals of care.  Patient had hospitalization 10/7 through 07/28/2020 for pathological left femoral fracture requiring surgical correction.  Patient seen in ED on 10/10/2020 for compression fracture of T11.  Patient hospitalized 1/20 through 11/08/2020 for pleural effusion.  Patient states that with the pathological fractures he has not been trying to use walker to get around.  He can transfer himself from bed to wheelchair and uses wheelchair to get around.  He is able to perform ADLs with minimal help.  States that his appetite is fair.  States that he eats okay just less than what he used to.  Does endorse that things do not taste the same as they used to.  07/28/2020 patient  weighed 249.5 pounds with BMI of 34.87.  Patient was 235.1 pounds with BMI of 32.86 on 11/08/2020.  Last weight on 11/13/2020 was 241 pounds with BMI of 32.73.  States that about a month ago he was started on fentanyl 100 mcg/h patch and feels as if his pain is well controlled.  Has Percocet as needed for breakthrough pain and states that he has not been needing to use this very often.  Patient is having episodes of constipation does take Dulcolax 2-3 tabs per day.  Has also  been trying senna S and MiraLAX.  Currently is having a bowel movement about every other day.  Rest of 10 point ROS asked and negative   CODE STATUS: DNR  PPS: 40% HOSPICE ELIGIBILITY/DIAGNOSIS: TBD  PHYSICAL EXAM:   General: patient in bed in NAD Eyes: sclera anicteric and noninjected with no discharge noted ENMT: moist mucous membranes Pulmonary: normal respiratory effort Extremities:1+ edema to bilateral feet, no joint deformities Skin: no rashes on exposed skin Neurological: Weakness but otherwise nonfocal   PAST MEDICAL HISTORY:  Past Medical History:  Diagnosis Date   Arthritis    neck   Cancer, metastatic to bone ([place]) dx'd 10/2019   Diabetes ([place])    Fatty liver 12/05/2019   By [place] 11/2019   GERD (gastroesophageal reflux disease)    Heart murmur    "heart skip" since his 20's   High cholesterol    IBS (irritable bowel syndrome)    lung ca dx'd 10/2019   lung stage 4    OSA (obstructive sleep apnea) 09/15/2019   Sleep study 12/2017 - severe OSA with AHI 65.6, desat to 76% rec CPAP   Severe obesity (BMI 35.0-39.9) with comorbidity ([place]) 09/04/2019    SOCIAL HX:  Social History   Tobacco Use   Smoking status: Current Every Day Smoker    Packs/day: 3.00    Years: 40.00    Pack years: 120.00    Last attempt to quit: 01/18/2020    Years since quitting: 0.8   Smokeless tobacco: Never Used   Tobacco comment: Currently smoking about 5 cigs a day  Substance Use Topics   Alcohol use: Yes    Comment: Rarely    ALLERGIES: No Known Allergies   PERTINENT MEDICATIONS:  Outpatient Encounter Medications as of 11/24/2020  Medication Sig   lidocaine-prilocaine (EMLA) cream Apply 1 application topically as needed. Apply 1 TBSP over port site  1-2 hours prior to treatment. Do not rub it in. Cover with plastic wrap.   albuterol (PROVENTIL) (2.5 MG/3ML) 0.083% nebulizer solution Take 3 mLs (2.5 mg total) by nebulization every 6 (six) hours as needed for wheezing or  shortness of breath.   atorvastatin (LIPITOR) 20 MG tablet TAKE 1/2 TABLET BY MOUTH DAILY (Patient taking differently: Take 10 mg by mouth daily.)   dexamethasone (DECADRON) 4 MG tablet Take 2 tablets TWICE a day the day before, the day of, and the day after chemotherapy.   diazepam (VALIUM) 5 MG tablet Take 1 tablet (5 mg total) by mouth every 12 (twelve) hours as needed for anxiety or muscle spasms.   docusate sodium (COLACE) 100 MG capsule Take 1 capsule (100 mg total) by mouth 2 (two) times daily.   ELIQUIS 5 MG TABS tablet TAKE 1 TABLET BY MOUTH TWICE A DAY (Patient taking differently: Take 5 mg by mouth 2 (two) times daily.)   fentaNYL (DURAGESIC) 100 MCG/HR Place 1 patch onto the skin every 3 (three) days.   filgrastim-sndz Lesleigh Noe)  480 MCG/0.8ML SOSY injection Injection 0.8 mLs (480 mcg) total into the skin once daily for two days   furosemide (LASIX) 20 MG tablet Take 2 tablets (40 mg total) by mouth daily for 7 days, THEN 1 tablet (20 mg total) daily.   loratadine (CLARITIN) 10 MG tablet Take 10 mg by mouth daily.   magic mouthwash SOLN Take 5 mLs by mouth 4 (four) times daily.   metFORMIN (GLUCOPHAGE) 1000 MG tablet Take 1 tablet (1,000 mg total) by mouth 2 (two) times daily with a meal.   methocarbamol (ROBAXIN) 500 MG tablet Take 1 tablet (500 mg total) by mouth every 6 (six) hours as needed for muscle spasms.   oxyCODONE-acetaminophen (PERCOCET) 10-325 MG tablet Take 1 tablet by mouth every 8 (eight) hours as needed for up to 15 days for pain.   senna-docusate (SENOKOT-S) 8.6-50 MG tablet Take 2 tablets by mouth 2 (two) times daily as needed for mild constipation.   sildenafil (REVATIO) 20 MG tablet Take 1-5 tablets (20-100 mg total) by mouth daily as needed (ED). (Patient taking differently: Take 40 mg by mouth daily as needed (ED).)   ZIEXTENZO 6 MG/0.6ML injection Inject 6 mg into the skin every 21 ( twenty-one) days.   No facility-administered encounter medications  on file as of 11/24/2020.     Breckan Cafiero Jenetta Downer, NP

## 2020-11-24 NOTE — Telephone Encounter (Signed)
Pt denies fever ,chills other signs of infection and was told to call for any fever with/ without chills or other signs of infection.  Pt did not receive  Zarxio as scheduled.. Per [facility] pt insurance ,[place], would not authorize Zarxio. He needed this for neutropenia because the Ziexentenzo 6 mg from [facility] was not received on time. I called insurance and was told to contact [place] , [place].  Pharmacy is working on it.

## 2020-11-26 ENCOUNTER — Encounter: Payer: Self-pay | Admitting: Family Medicine

## 2020-11-26 ENCOUNTER — Ambulatory Visit (INDEPENDENT_AMBULATORY_CARE_PROVIDER_SITE_OTHER): Payer: 59 | Admitting: Family Medicine

## 2020-11-26 ENCOUNTER — Other Ambulatory Visit: Payer: Self-pay | Admitting: *Deleted

## 2020-11-26 ENCOUNTER — Other Ambulatory Visit: Payer: Self-pay

## 2020-11-26 VITALS — BP 124/80 | HR 107 | Temp 97.6°F | Ht 72.0 in | Wt 226.1 lb

## 2020-11-26 DIAGNOSIS — K5903 Drug induced constipation: Secondary | ICD-10-CM | POA: Diagnosis not present

## 2020-11-26 DIAGNOSIS — I2602 Saddle embolus of pulmonary artery with acute cor pulmonale: Secondary | ICD-10-CM

## 2020-11-26 DIAGNOSIS — F4321 Adjustment disorder with depressed mood: Secondary | ICD-10-CM

## 2020-11-26 DIAGNOSIS — C7951 Secondary malignant neoplasm of bone: Secondary | ICD-10-CM | POA: Diagnosis not present

## 2020-11-26 DIAGNOSIS — E440 Moderate protein-calorie malnutrition: Secondary | ICD-10-CM

## 2020-11-26 DIAGNOSIS — G893 Neoplasm related pain (acute) (chronic): Secondary | ICD-10-CM

## 2020-11-26 DIAGNOSIS — Z66 Do not resuscitate: Secondary | ICD-10-CM

## 2020-11-26 DIAGNOSIS — I5033 Acute on chronic diastolic (congestive) heart failure: Secondary | ICD-10-CM

## 2020-11-26 DIAGNOSIS — E785 Hyperlipidemia, unspecified: Secondary | ICD-10-CM

## 2020-11-26 DIAGNOSIS — E1169 Type 2 diabetes mellitus with other specified complication: Secondary | ICD-10-CM

## 2020-11-26 DIAGNOSIS — J90 Pleural effusion, not elsewhere classified: Secondary | ICD-10-CM

## 2020-11-26 DIAGNOSIS — C3491 Malignant neoplasm of unspecified part of right bronchus or lung: Secondary | ICD-10-CM

## 2020-11-26 DIAGNOSIS — Z7189 Other specified counseling: Secondary | ICD-10-CM

## 2020-11-26 DIAGNOSIS — I2782 Chronic pulmonary embolism: Secondary | ICD-10-CM

## 2020-11-26 MED ORDER — METFORMIN HCL 500 MG PO TABS: 500.0000 mg | ORAL_TABLET | Freq: Every day | ORAL | 1 refills | Status: AC

## 2020-11-26 NOTE — Patient Outreach (Signed)
[facility]) Care Management  [place]  11/26/2020   Mike [name] 11-May-1966 [id number]   Outgoing call placed to member, report he is doing much better, improved from recent hospitalization.  State he is using the home O2 at night, able to be on room air during the day.  Has appointment with PCP today and with the cancer center tomorrow.  Report pain is still manageable with current regime.  Denies any urgent concerns, advised of transition to new RNCM, he verbalizes understanding.  Encounter Medications:  Outpatient Encounter Medications as of 11/26/2020  Medication Sig  . albuterol (PROVENTIL) (2.5 MG/3ML) 0.083% nebulizer solution Take 3 mLs (2.5 mg total) by nebulization every 6 (six) hours as needed for wheezing or shortness of breath.  [name] atorvastatin (LIPITOR) 20 MG tablet TAKE 1/2 TABLET BY MOUTH DAILY (Patient taking differently: Take 10 mg by mouth daily.)  . dexamethasone (DECADRON) 4 MG tablet Take 2 tablets TWICE a day the day before, the day of, and the day after chemotherapy.  . diazepam (VALIUM) 5 MG tablet Take 1 tablet (5 mg total) by mouth every 12 (twelve) hours as needed for anxiety or muscle spasms.  [name] docusate sodium (COLACE) 100 MG capsule Take 1 capsule (100 mg total) by mouth 2 (two) times daily.  [name] ELIQUIS 5 MG TABS tablet TAKE 1 TABLET BY MOUTH TWICE A DAY (Patient taking differently: Take 5 mg by mouth 2 (two) times daily.)  . fentaNYL (DURAGESIC) 100 MCG/HR Place 1 patch onto the skin every 3 (three) days.  Karle Barr (ZARXIO) 480 MCG/0.8ML SOSY injection Injection 0.8 mLs (480 mcg) total into the skin once daily for two days  . furosemide (LASIX) 20 MG tablet Take 2 tablets (40 mg total) by mouth daily for 7 days, THEN 1 tablet (20 mg total) daily.  [name] lidocaine-prilocaine (EMLA) cream Apply 1 application topically as needed. Apply 1 TBSP over port site  1-2 hours prior to treatment. Do not rub it in. Cover with plastic wrap.  .  loratadine (CLARITIN) 10 MG tablet Take 10 mg by mouth daily.  . magic mouthwash SOLN Take 5 mLs by mouth 4 (four) times daily.  . methocarbamol (ROBAXIN) 500 MG tablet Take 1 tablet (500 mg total) by mouth every 6 (six) hours as needed for muscle spasms.  [name] oxyCODONE-acetaminophen (PERCOCET) 10-325 MG tablet Take 1 tablet by mouth every 8 (eight) hours as needed for up to 15 days for pain.  [name] senna-docusate (SENOKOT-S) 8.6-50 MG tablet Take 2 tablets by mouth 2 (two) times daily as needed for mild constipation.  . sildenafil (REVATIO) 20 MG tablet Take 1-5 tablets (20-100 mg total) by mouth daily as needed (ED). (Patient taking differently: Take 40 mg by mouth daily as needed (ED).)  . ZIEXTENZO 6 MG/0.6ML injection Inject 6 mg into the skin every 21 ( twenty-one) days.  . [DISCONTINUED] metFORMIN (GLUCOPHAGE) 1000 MG tablet Take 1 tablet (1,000 mg total) by mouth 2 (two) times daily with a meal.   No facility-administered encounter medications on file as of 11/26/2020.    Functional Status:  In your present state of health, do you have any difficulty performing the following activities: 11/06/2020  Hearing? N  Vision? N  Difficulty concentrating or making decisions? N  Walking or climbing stairs? Y  Comment secondary to shortness of breath and weakness  Dressing or bathing? Y  Doing errands, shopping? Y  Some recent data might be hidden    Fall/Depression Screening: Fall  Risk  08/08/2020  Falls in the past year? 0  Number falls in past yr: 0  Injury with Fall? 0  Risk for fall due to : Impaired balance/gait;Impaired mobility  Follow up Education provided;Falls prevention discussed   PHQ 2/9 Scores 08/08/2020 07/29/2020 11/23/2019 09/04/2019  PHQ - 2 Score 0 0 0 0  PHQ- 9 Score - - - 3    Assessment:  Goals Addressed            This Visit's Progress   . COMPLETED: [facility] - Keep Pain Under Control       Follow Up Date 10/29/2020  Timeframe:  Long-Range Goal Priority:   High Start Date:      10/15/2020                       Expected End Date:        12/15/2020                 - develop a personal pain management plan - keep track of prescription refills - learn relaxation techniques - plan exercise or activity when pain is best controlled    Why is this important?   Day-to-day life can be hard when you have pain.  Even a small change in emotion or a physical problem can make pain better or worse.  Coping with pain depends on how the mind and body reacts to pain.  Pain medicine is just one piece of the treatment puzzle.  There are many tools to help manage pain. A combination of them can be used to best meet your needs.     Notes:   11/22 - completed - report pain in manageable  12/29 - Discussed increase in pain meds (Fentanyl patch), will request increase in breakthrough pain meds   2/9 - completed & case closed, transitioned to new RNCM through chronic care management     . COMPLETED: [facility] - Make and Keep All Appointments       Follow Up Date 10/29/2020  Timeframe:  Short-Term Goal Priority:  Medium Start Date:        10/15/2020                     Expected End Date:          11/15/2020               - ask family or friend for a ride - call to cancel if needed - keep a calendar with appointment dates    Why is this important?   Part of staying healthy is seeing the doctor for follow-up care.  If you forget your appointments, there are some things you can do to stay on track.    Notes:  Referral to CSW for transportation  11/22 - upcoming appointments reviewed   2/9 - completed & case closed, transitioned to new RNCM through chronic care management     . COMPLETED: [facility] - Manage Fatigue (Tiredness)       Follow Up Date 10/29/2020  Timeframe:  Short-Term Goal Priority:  Medium Start Date:       10/15/2020                      Expected End Date:     11/15/2020                    - eat healthy - maintain healthy weight -  use  meditation or relaxation techniques    Why is this important?   Cancer treatment and its side effects can drain your energy. It can keep you from doing things you would like to do.  There are many things that you can do to manage fatigue.    Notes:    2/9 - completed & case closed, transitioned to new RNCM through chronic care management        Plan:  Follow-up:  Will close case, update provided to new RNCM through chronic care management.  Valente David, [place], MSN [place] [phone]

## 2020-11-26 NOTE — Patient Instructions (Addendum)
Good to see you today Drop metformin dose to 500mg  once daily.  New dose at pharmacy.  Continue eliquis.

## 2020-11-26 NOTE — Progress Notes (Signed)
Patient ID: GEE HABIG, male    DOB: Dec 05, 1965, 55 y.o.   MRN: [id number]  This visit was conducted in person.  BP 124/80   Pulse (!) 107   Temp 97.6 F (36.4 C) (Temporal)   Ht 6' (1.829 m)   Wt 226 lb 2 oz (102.6 kg)   SpO2 95%   BMI 30.67 kg/m    CC: hosp f/u visit  Subjective:   HPI: Mike [name] is a 56 y.o. male presenting on 11/26/2020 for Hospitalization Follow-up (Admitted on 11/06/20 at [facility], dx pleural effusion.  Pt accompanied by significant other, Lisa- temp 97.7.  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 Diagnosed 12/2019 s/p chemo, immunotherapy and XRT. He continues palliative chemo Q3 wks.  Pain managed with fentanyl patch 130mcg/h as well as sparing PRN percocets.  Palliative care involved. Latest home visit from 11/24/2020 reviewed.  Pt confirmed DNR status. Brother Claiborne Billings is HCPOA.   Recent course of severe constipation - 1/2 bottle MOM caused severe blowout with weakness for 18 hours in bed. Current bowel regimen is 1 capful miralax and 1 senna BID regularly. 1 soft stool a day with this regimen.   Considering applying for disability. Currently on LTD through work, [employer] benefits to end 01/2021.   Latest hospitalization 10/2020 for lower abd pain - found mod R pleural effusion s/p fluid removal. Known nonocclusive bilat PE on eliquis 5mg  bid. Had oxygen requirement - discharged on 2L O2, since then not using and maintains good O2 sats at rest in office.  Receiving [facility] through [facility] this month.   Diastolic CHF - continues lasix 20mg  daily at this time. Wonders if needs cards f/u.  Decreased appetite. 2 meals a day - limited protein intake. He drinks protein shake daily.   DM - only on metformin 1000mg  once daily. He takes dexamethasone for 3 days around each chemo session (Q3 wks).  Lab Results  Component Value Date   HGBA1C 5.7 (H) 11/07/2020     Admit date: 11/06/2020 Discharge date: 11/08/2020 TCM  hosp f/u phone call not completed.  Admitted From: Home Disposition: Home  Recommendations for Outpatient Follow-up:  1. Follow ups as below. 2. Please obtain CBC/BMP/Mag at follow up 3. Please follow up on the following pending results: None  Home Health: PT/OT Equipment/Devices: Shower chair with back and arm, 2 L oxygen  Discharge Condition: Stable CODE STATUS: DNR/DNI     Relevant past medical, surgical, family and social history reviewed and updated as indicated. Interim medical history since our last visit reviewed. Allergies and medications reviewed and updated. Outpatient Medications Prior to Visit  Medication Sig Dispense Refill  . albuterol (PROVENTIL) (2.5 MG/3ML) 0.083% nebulizer solution Take 3 mLs (2.5 mg total) by nebulization every 6 (six) hours as needed for wheezing or shortness of breath. 75 mL 12  . atorvastatin (LIPITOR) 20 MG tablet TAKE 1/2 TABLET BY MOUTH DAILY (Patient taking differently: Take 10 mg by mouth daily.) 45 tablet 0  . dexamethasone (DECADRON) 4 MG tablet Take 2 tablets TWICE a day the day before, the day of, and the day after chemotherapy. 60 tablet 2  . diazepam (VALIUM) 5 MG tablet Take 1 tablet (5 mg total) by mouth every 12 (twelve) hours as needed for anxiety or muscle spasms. 30 tablet 0  . docusate sodium (COLACE) 100 MG capsule Take 1 capsule (100 mg total) by mouth 2 (two) times daily. 10 capsule  0  . ELIQUIS 5 MG TABS tablet TAKE 1 TABLET BY MOUTH TWICE A DAY (Patient taking differently: Take 5 mg by mouth 2 (two) times daily.) 60 tablet 3  . fentaNYL (DURAGESIC) 100 MCG/HR Place 1 patch onto the skin every 3 (three) days. 5 patch 0  . filgrastim-sndz (ZARXIO) 480 MCG/0.8ML SOSY injection Injection 0.8 mLs (480 mcg) total into the skin once daily for two days 1.6 mL 0  . furosemide (LASIX) 20 MG tablet Take 2 tablets (40 mg total) by mouth daily for 7 days, THEN 1 tablet (20 mg total) daily. 97 tablet 0  . lidocaine-prilocaine (EMLA)  cream Apply 1 application topically as needed. Apply 1 TBSP over port site  1-2 hours prior to treatment. Do not rub it in. Cover with plastic wrap. 30 g 0  . loratadine (CLARITIN) 10 MG tablet Take 10 mg by mouth daily.    . magic mouthwash SOLN Take 5 mLs by mouth 4 (four) times daily. 240 mL 0  . methocarbamol (ROBAXIN) 500 MG tablet Take 1 tablet (500 mg total) by mouth every 6 (six) hours as needed for muscle spasms. 60 tablet 6  . oxyCODONE-acetaminophen (PERCOCET) 10-325 MG tablet Take 1 tablet by mouth every 8 (eight) hours as needed for up to 15 days for pain. 40 tablet 0  . senna-docusate (SENOKOT-S) 8.6-50 MG tablet Take 2 tablets by mouth 2 (two) times daily as needed for mild constipation.    . sildenafil (REVATIO) 20 MG tablet Take 1-5 tablets (20-100 mg total) by mouth daily as needed (ED). (Patient taking differently: Take 40 mg by mouth daily as needed (ED).) 30 tablet 3  . ZIEXTENZO 6 MG/0.6ML injection Inject 6 mg into the skin every 21 ( twenty-one) days.    . metFORMIN (GLUCOPHAGE) 1000 MG tablet Take 1 tablet (1,000 mg total) by mouth 2 (two) times daily with a meal. 180 tablet 0   No facility-administered medications prior to visit.     Per HPI unless specifically indicated in ROS section below Review of Systems Objective:  BP 124/80   Pulse (!) 107   Temp 97.6 F (36.4 C) (Temporal)   Ht 6' (1.829 m)   Wt 226 lb 2 oz (102.6 kg)   SpO2 95%   BMI 30.67 kg/m   Wt Readings from Last 3 Encounters:  11/26/20 226 lb 2 oz (102.6 kg)  11/13/20 241 lb 4.8 oz (109.5 kg)  11/07/20 242 lb 4.8 oz (109.9 kg)      Physical Exam Vitals and nursing note reviewed.  Constitutional:      Appearance: Normal appearance. He is ill-appearing.     Comments: Uncomfortable, repositioning in wheelchair  Cardiovascular:     Rate and Rhythm: Normal rate and regular rhythm.     Pulses: Normal pulses.     Heart sounds: Normal heart sounds. No murmur heard.   Pulmonary:     Effort:  Pulmonary effort is normal. No respiratory distress.     Breath sounds: Rhonchi (RUL) present. No wheezing.     Comments: Coarse RUL lung sounds Musculoskeletal:     Right lower leg: No edema.     Left lower leg: No edema.  Skin:    General: Skin is warm and dry.     Findings: No rash.  Neurological:     Mental Status: He is alert.  Psychiatric:        Mood and Affect: Mood normal.        Behavior: Behavior normal.  Results for orders placed or performed in visit on 11/20/20  CMP ([place] only)  Result Value Ref Range   Sodium 136 135 - 145 mmol/L   Potassium 4.1 3.5 - 5.1 mmol/L   Chloride 97 (L) 98 - 111 mmol/L   CO2 30 22 - 32 mmol/L   Glucose, Bld 93 70 - 99 mg/dL   BUN 5 (L) 6 - 20 mg/dL   Creatinine 0.55 (L) 0.61 - 1.24 mg/dL   Calcium 9.0 8.9 - 10.3 mg/dL   Total Protein 6.3 (L) 6.5 - 8.1 g/dL   Albumin 2.4 (L) 3.5 - 5.0 g/dL   AST 21 15 - 41 U/L   ALT 14 0 - 44 U/L   Alkaline Phosphatase 86 38 - 126 U/L   Total Bilirubin 0.6 0.3 - 1.2 mg/dL   GFR, Estimated >60 >60 mL/min   Anion gap 9 5 - 15  CBC with Differential (Cancer Center Only)  Result Value Ref Range   WBC Count 1.3 (L) 4.0 - 10.5 K/uL   RBC 2.98 (L) 4.22 - 5.81 MIL/uL   Hemoglobin 8.7 (L) 13.0 - 17.0 g/dL   HCT 27.8 (L) 39.0 - 52.0 %   MCV 93.3 80.0 - 100.0 fL   MCH 29.2 26.0 - 34.0 pg   MCHC 31.3 30.0 - 36.0 g/dL   RDW 20.7 (H) 11.5 - 15.5 %   Platelet Count 197 150 - 400 K/uL   nRBC 0.0 0.0 - 0.2 %   Neutrophils Relative % 37 %   Neutro Abs 0.5 (L) 1.7 - 7.7 K/uL   Lymphocytes Relative 34 %   Lymphs Abs 0.4 (L) 0.7 - 4.0 K/uL   Monocytes Relative 24 %   Monocytes Absolute 0.3 0.1 - 1.0 K/uL   Eosinophils Relative 1 %   Eosinophils Absolute 0.0 0.0 - 0.5 K/uL   Basophils Relative 2 %   Basophils Absolute 0.0 0.0 - 0.1 K/uL   WBC Morphology DYSPLASTIC NEUTROPHILS    Immature Granulocytes 2 %   Abs Immature Granulocytes 0.03 0.00 - 0.07 K/uL   Lab Results  Component Value Date    CHOL 155 11/20/2019   HDL 35.80 (L) 11/20/2019   LDLCALC 92 11/20/2019   TRIG 139.0 11/20/2019   CHOLHDL 4 11/20/2019    Assessment & Plan:  This visit occurred during the SARS-CoV-2 public health emergency.  Safety protocols were in place, including screening questions prior to the visit, additional usage of staff PPE, and extensive cleaning of exam room while observing appropriate contact time as indicated for disinfecting solutions.   Problem List Items Addressed This Visit    Type 2 diabetes mellitus with other specified complication ([place]) (Chronic)    Great control as evidenced by recent A1c - will drop metformin dose to 500mg  once daily.       Relevant Medications   metFORMIN (GLUCOPHAGE) 500 MG tablet   Stage IV squamous cell carcinoma of right lung ([place])    Appreciate onc and palliative care assistance.  Has established with home based palliative care through [facility]      Situational depression    Not on daily medication. Continues PRN diazepam.       Protein-calorie malnutrition ([place])    As evidenced by recent low protein levels.  Reviewed dietary protein intake - continue daily protein shake (boost or ensure).       Pleural effusion    S/p thoracocentesis with 1+ L removal during latest hospitalization last month - cancer related  effusion.       Dyslipidemia associated with type 2 diabetes mellitus ([place])    Only on 1/2 tab atorvastatin daily, tolerating well. Continue.  The 10-year ASCVD risk score Mikey Bussing DC Brooke Bonito., et al., 2013) is: 20.3%   Values used to calculate the score:     Age: 47 years     Sex: Male     Is Non-Hispanic African American: No     Diabetic: Yes     Tobacco smoker: Yes     Systolic Blood Pressure: 254 mmHg     Is BP treated: No     HDL Cholesterol: 35.8 mg/dL     Total Cholesterol: 155 mg/dL       Relevant Medications   metFORMIN (GLUCOPHAGE) 500 MG tablet   DNR (do not resuscitate)    Confirmed with patient.       Constipation  due to pain medication    Discussed bowel regimen.  Continue miralax 1 capful daily + senna 1 tab BID.  Discussed fiber in diet.       Chronic pulmonary embolism (HCC)    Continue eliquis.       Cancer related pain    Continue fentayl with PRN percocets      Bony metastasis (HCC)    Current pain regimen is fentanyl 171mcg patch Q3d with PRN percocet for breakthrough pain - but uses PO med sparingly due to constipation side effect.       Advanced directives, counseling/discussion    DNR confirmed.  Working on MOST form at home.  Brother Claiborne Billings would be HCPOA.       (HFpEF) heart failure with preserved ejection fraction ([place]) - Primary    Recent hospitalization for this, seems euvolemic and stable on lasix 20mg  daily. Kidneys seem to be tolerating this well. Will defer cards eval at this time as he seems stable on current regimen.           Meds ordered this encounter  Medications  . metFORMIN (GLUCOPHAGE) 500 MG tablet    Sig: Take 1 tablet (500 mg total) by mouth daily with breakfast.    Dispense:  90 tablet    Refill:  1    Note new dose   No orders of the defined types were placed in this encounter.   Patient Instructions  Good to see you today Drop metformin dose to 500mg  once daily.  New dose at pharmacy.  Continue eliquis.    Follow up plan: Return if symptoms worsen or fail to improve.  [name], MD

## 2020-11-27 ENCOUNTER — Inpatient Hospital Stay: Payer: 59

## 2020-11-27 ENCOUNTER — Other Ambulatory Visit: Payer: Self-pay

## 2020-11-27 DIAGNOSIS — K5903 Drug induced constipation: Secondary | ICD-10-CM | POA: Insufficient documentation

## 2020-11-27 DIAGNOSIS — Z5112 Encounter for antineoplastic immunotherapy: Secondary | ICD-10-CM | POA: Diagnosis not present

## 2020-11-27 DIAGNOSIS — Z95828 Presence of other vascular implants and grafts: Secondary | ICD-10-CM

## 2020-11-27 DIAGNOSIS — E46 Unspecified protein-calorie malnutrition: Secondary | ICD-10-CM | POA: Insufficient documentation

## 2020-11-27 DIAGNOSIS — I5030 Unspecified diastolic (congestive) heart failure: Secondary | ICD-10-CM | POA: Insufficient documentation

## 2020-11-27 DIAGNOSIS — C3491 Malignant neoplasm of unspecified part of right bronchus or lung: Secondary | ICD-10-CM

## 2020-11-27 DIAGNOSIS — Z66 Do not resuscitate: Secondary | ICD-10-CM | POA: Insufficient documentation

## 2020-11-27 LAB — CMP (CANCER CENTER ONLY)
ALT: 8 U/L (ref 0–44)
AST: 17 U/L (ref 15–41)
Albumin: 2.4 g/dL — ABNORMAL LOW (ref 3.5–5.0)
Alkaline Phosphatase: 86 U/L (ref 38–126)
Anion gap: 9 (ref 5–15)
BUN: 5 mg/dL — ABNORMAL LOW (ref 6–20)
CO2: 28 mmol/L (ref 22–32)
Calcium: 9 mg/dL (ref 8.9–10.3)
Chloride: 102 mmol/L (ref 98–111)
Creatinine: 0.6 mg/dL — ABNORMAL LOW (ref 0.61–1.24)
GFR, Estimated: >60 mL/min (ref >60)
Glucose, Bld: 90 mg/dL (ref 70–99)
Potassium: 4.1 mmol/L (ref 3.5–5.1)
Sodium: 139 mmol/L (ref 135–145)
Total Bilirubin: 0.3 mg/dL (ref 0.3–1.2)
Total Protein: 6.4 g/dL — ABNORMAL LOW (ref 6.5–8.1)

## 2020-11-27 LAB — CBC WITH DIFFERENTIAL (CANCER CENTER ONLY)
Abs Immature Granulocytes: 0.01 10*3/uL (ref 0.00–0.07)
Basophils Absolute: 0 10*3/uL (ref 0.0–0.1)
Basophils Relative: 1 %
Eosinophils Absolute: 0 10*3/uL (ref 0.0–0.5)
Eosinophils Relative: 1 %
HCT: 32.7 % — ABNORMAL LOW (ref 39.0–52.0)
Hemoglobin: 10.3 g/dL — ABNORMAL LOW (ref 13.0–17.0)
Immature Granulocytes: 0 %
Lymphocytes Relative: 24 %
Lymphs Abs: 0.8 10*3/uL (ref 0.7–4.0)
MCH: 29.3 pg (ref 26.0–34.0)
MCHC: 31.5 g/dL (ref 30.0–36.0)
MCV: 93.2 fL (ref 80.0–100.0)
Monocytes Absolute: 0.8 10*3/uL (ref 0.1–1.0)
Monocytes Relative: 24 %
Neutro Abs: 1.6 10*3/uL — ABNORMAL LOW (ref 1.7–7.7)
Neutrophils Relative %: 50 %
Platelet Count: 238 10*3/uL (ref 150–400)
RBC: 3.51 MIL/uL — ABNORMAL LOW (ref 4.22–5.81)
RDW: 20.8 % — ABNORMAL HIGH (ref 11.5–15.5)
WBC Count: 3.2 10*3/uL — ABNORMAL LOW (ref 4.0–10.5)
nRBC: 0 % (ref 0.0–0.2)

## 2020-11-27 MED ORDER — SODIUM CHLORIDE 0.9% FLUSH
10.0000 mL | Freq: Once | INTRAVENOUS | Status: AC
  Administered 2020-11-27: 10 mL
  Filled 2020-11-27: qty 10

## 2020-11-27 MED ORDER — HEPARIN SOD (PORK) LOCK FLUSH 100 UNIT/ML IV SOLN
500.0000 [IU] | Freq: Once | INTRAVENOUS | Status: AC
  Administered 2020-11-27: 500 [IU]
  Filled 2020-11-27: qty 5

## 2020-11-27 NOTE — Assessment & Plan Note (Signed)
Confirmed with patient.

## 2020-11-27 NOTE — Assessment & Plan Note (Signed)
Not on daily medication. Continues PRN diazepam.

## 2020-11-27 NOTE — Assessment & Plan Note (Signed)
Continue eliquis  ?

## 2020-11-27 NOTE — Assessment & Plan Note (Addendum)
Current pain regimen is fentanyl 12mcg patch Q3d with PRN percocet for breakthrough pain - but uses PO med sparingly due to constipation side effect.

## 2020-11-27 NOTE — Assessment & Plan Note (Signed)
DNR confirmed.  Working on MOST form at home.  Brother Claiborne Billings would be HCPOA.

## 2020-11-27 NOTE — Assessment & Plan Note (Addendum)
Recent hospitalization for this, seems euvolemic and stable on lasix 20mg  daily. Kidneys seem to be tolerating this well. Will defer cards eval at this time as he seems stable on current regimen.

## 2020-11-27 NOTE — Assessment & Plan Note (Signed)
Continue fentayl with PRN percocets

## 2020-11-27 NOTE — Assessment & Plan Note (Signed)
Only on 1/2 tab atorvastatin daily, tolerating well. Continue.  The 10-year ASCVD risk score Mikey Bussing DC Brooke Bonito., et al., 2013) is: 20.3%   Values used to calculate the score:     Age: 55 years     Sex: Male     Is Non-Hispanic African American: No     Diabetic: Yes     Tobacco smoker: Yes     Systolic Blood Pressure: 784 mmHg     Is BP treated: No     HDL Cholesterol: 35.8 mg/dL     Total Cholesterol: 155 mg/dL

## 2020-11-27 NOTE — Assessment & Plan Note (Addendum)
Appreciate onc and palliative care assistance.  Has established with home based palliative care through [facility].

## 2020-11-27 NOTE — Assessment & Plan Note (Signed)
S/p thoracocentesis with 1+ L removal during latest hospitalization last month - cancer related effusion.

## 2020-11-27 NOTE — Assessment & Plan Note (Signed)
As evidenced by recent low protein levels.  Reviewed dietary protein intake - continue daily protein shake (boost or ensure).

## 2020-11-27 NOTE — Assessment & Plan Note (Signed)
Great control as evidenced by recent A1c - will drop metformin dose to 500mg  once daily.

## 2020-11-27 NOTE — Assessment & Plan Note (Signed)
Discussed bowel regimen.  Continue miralax 1 capful daily + senna 1 tab BID.  Discussed fiber in diet.

## 2020-11-28 ENCOUNTER — Other Ambulatory Visit: Payer: Self-pay | Admitting: Physician Assistant

## 2020-11-28 ENCOUNTER — Telehealth: Payer: Self-pay

## 2020-11-28 DIAGNOSIS — C3491 Malignant neoplasm of unspecified part of right bronchus or lung: Secondary | ICD-10-CM

## 2020-11-28 MED ORDER — OXYCODONE-ACETAMINOPHEN 10-325 MG PO TABS: 1.0000 | ORAL_TABLET | Freq: Three times a day (TID) | ORAL | 0 refills | Status: AC | PRN

## 2020-11-28 NOTE — Telephone Encounter (Signed)
11:30AM: SW outreached patient to discuss disability coverage, per NP referral. Patient shared that he is currently receiving LTD benefits through his employer but is unsure at this time the longevity of the coverage timeframe.  States he will call the HR department today to get an update. Patient shared he just wanted information on the process for filing for SSDI. SW offered to email or mail information, patient stated email would be best. SW provided insight on application process and time frame for approval of SSDI and encouraged patient to apply as soon as possible. Patient stated understanding. Patient had no other concerns or questions for SW at this time. SW sent email to patient at [email or web address] with SSD information. Patient has SW contact information for any future needs/questions.   The following information was shared: Social Security Disability Phone: [phone]  Website: [email or web address]   Info: You can use the online application to apply for disability benefits if you: - Are age 67 or older; - Are not currently receiving benefits on your own Social Security record; - Are unable to work because of a medical condition that is expected to last at least 12 months or result in death; and - Have not been denied disability benefits in the last 60 days. If your application was recently denied, our Internet Appeal application is a starting point to request a review of the determination we made. - You can now also file for [place] (SSI) online but only if you meet certain requirements.  - The number of work credits you need to qualify for disability benefits depends on your age when you become disabled. Generally, you need 40 credits, 13 of which were earned in the last 10 years ending with the year you become disabled. However, younger workers may qualify with fewer credits.  Whether you apply online, by phone, or in person, the  disability benefits application process follows these general steps: . You gather the information and documents you need to apply. We recommend you print and review the Adult Disability Checklist. It will help you gather the information you need to complete the application. . You complete and submit your application. . We review your application to make sure you meet some basic requirements for disability benefits. . We check whether you worked enough years to qualify. . We evaluate any current work activities. . We process your application and forward your case to the Disability Determination Services office in your state. . This State agency makes the disability determination decision. To learn more about who decides if you are disabled, read our publication Disability Benefits. Once [employer] Once we receive your application, we'll review it and contact you if we have questions. We might request additional documents from you before we can proceed

## 2020-12-02 ENCOUNTER — Ambulatory Visit: Payer: Self-pay

## 2020-12-02 ENCOUNTER — Inpatient Hospital Stay: Payer: 59

## 2020-12-02 ENCOUNTER — Inpatient Hospital Stay: Payer: 59 | Admitting: Internal Medicine

## 2020-12-02 ENCOUNTER — Telehealth: Payer: 59

## 2020-12-02 ENCOUNTER — Ambulatory Visit: Payer: 59

## 2020-12-02 DIAGNOSIS — C3491 Malignant neoplasm of unspecified part of right bronchus or lung: Secondary | ICD-10-CM

## 2020-12-02 DIAGNOSIS — I5032 Chronic diastolic (congestive) heart failure: Secondary | ICD-10-CM

## 2020-12-02 NOTE — Patient Instructions (Addendum)
Visit Information  PATIENT GOALS:  Goals Addressed            This Visit's Progress   . RNCM - Keep Pain Under Control       Timeframe:  Long-Range Goal Priority:  High Start Date:      12/02/20                      Expected End Date:    02/13/21              Will attend all scheduled provider appointments . Will call pharmacy for medication refills 7 days prior to needed refill date . Will calls provider office for new concerns or questions . - develop a personal pain management plan . - do something like read, listen to music or watch television to keep mind off pain . - learn relaxation techniques . - plan exercise or activity when pain is best controlled . - prioritize tasks for the day . - track times pain is worst and when it is best . - track what makes the pain worse and what makes it better . - use medicine only as doctor says   Why is this important?   Day-to-day life can be hard when you have pain.  Even a small change in emotion or a physical problem can make pain better or worse.  Coping with pain depends on how the mind and body reacts to pain.  Pain medicine is just one piece of the treatment puzzle.  There are many tools to help manage pain. A combination of them can be used to best meet your needs.       [name] RNCM - patient will track and manage his symptoms related to heart failure       Timeframe:  Long-Range Goal Priority:  High Start Date:    12/02/20                         Expected End Date:  03/02/21                     Follow Up Date 12/28/2020    - eat more whole grains, fruits and vegetables, lean meats and healthy fats - follow rescue plan if symptoms flare-up - know when to call the doctor - track symptoms and what helps feel better or worse - dress right for the weather, hot or cold  - Review Heart Failure action plan/ zones sent to you in My Chart - Follow up with your providers as recommended - Take your medications as prescribed.  - Monitor  for signs/ symptoms of heart failure:  Weight gain of 3 pounds overnight or 5 lbs in a week, swelling in feet, ankles, stomach or hands, chest discomfort or heaviness, more tired/ less energy, dry hacky cough, new or worsening dizziness, uneasy feeling that something is not right.    Why is this important?    You will be able to handle your symptoms better if you keep track of them.   Making some simple changes to your lifestyle will help.   Eating healthy is one thing you can do to take good care of yourself.    Notes:     . RNCM - Patient will verbalize ability to manage fatigue symptoms       Timeframe:  Long-Range Goal Priority:   High Start Date:       12/02/20  Expected End Date:    4/ 29/22                  - do not eat or exercise right before bedtime - eat healthy - get outdoors every day (weather permitting) - limit daytime naps - maintain healthy weight - take a warm shower or bath before bed - use meditation or relaxation techniques    Why is this important?   Cancer treatment and its side effects can drain your energy. It can keep you from doing things you would like to do.  There are many things that you can do to manage fatigue.       Heart Failure Action Plan A heart failure action plan helps you understand what to do when you have symptoms of heart failure. Your action plan is a color-coded plan that lists the symptoms to watch for and indicates what actions to take.  If you have symptoms in the red zone, you need medical care right away.  If you have symptoms in the yellow zone, you are having problems.  If you have symptoms in the Jeyden Coffelt zone, you are doing well. Follow the plan that was created by you and your health care provider. Review your plan each time you visit your health care provider. Red zone These signs and symptoms mean you should get medical help right away:  You have trouble breathing when resting.  You have a dry  cough that is getting worse.  You have swelling or pain in your legs or abdomen that is getting worse.  You suddenly gain more than 2-3 lb (0.9-1.4 kg) in 24 hours, or more than 5 lb (2.3 kg) in a week. This amount may be more or less depending on your condition.  You have trouble staying awake or you feel confused.  You have chest pain.  You do not have an appetite.  You pass out.  You have worsening sadness or depression. If you have any of these symptoms, call your local emergency services (911 in the [place] right away. Do not drive yourself to the hospital.   Yellow zone These signs and symptoms mean your condition may be getting worse and you should make some changes:  You have trouble breathing when you are active, or you need to sleep with your head raised on extra pillows to help you breathe.  You have swelling in your legs or abdomen.  You gain 2-3 lb (0.9-1.4 kg) in 24 hours, or 5 lb (2.3 kg) in a week. This amount may be more or less depending on your condition.  You get tired easily.  You have trouble sleeping.  You have a dry cough. If you have any of these symptoms:  Contact your health care provider within the next day.  Your health care provider may adjust your medicines.   Zaley Talley zone These signs mean you are doing well and can continue what you are doing:  You do not have shortness of breath.  You have very little swelling or no new swelling.  Your weight is stable (no gain or loss).  You have a normal activity level.  You do not have chest pain or any other new symptoms.   Follow these instructions at home:  Take over-the-counter and prescription medicines only as told by your health care provider.  Weigh yourself daily. Your target weight is __________ lb (__________ kg). ? Call your health care provider if you gain more than __________ lb (__________ kg)  in 24 hours, or more than __________ lb (__________ kg) in a week. ? Health care provider  name: _____________________________________________________ ? Health care provider phone number: _____________________________________________________  Eat a heart-healthy diet. Work with a diet and nutrition specialist (dietitian) to create an eating plan that is best for you.  Keep all follow-up visits. This is important. Where to find more information  American Heart Association: www.heart.org Summary  A heart failure action plan helps you understand what to do when you have symptoms of heart failure.  Follow the action plan that was created by you and your health care provider.  Get help right away if you have any symptoms in the red zone. This information is not intended to replace advice given to you by your health care provider. Make sure you discuss any questions you have with your health care provider. Document Revised: 05/19/2020 Document Reviewed: 05/19/2020 Elsevier Patient Education  2021 [place].  Managing Cancer-Related Fatigue Fatigue, or feeling very tired, is a common problem when you have cancer. Fatigue comes from the cancer itself and the treatments that you are given, such as chemotherapy and radiation. It can make you feel weak and drained of energy even after a small amount of activity. Unlike the fatigue that healthy people feel, cancer-related fatigue may not go away with sleep or rest. Fatigue is usually worse during treatment, but survivors may continue to feel some fatigue after treatment ends. There are several things you can do to help manage your fatigue. How to prevent cancer-related fatigue Cancer treatment, low blood counts, appetite changes, and changes in sleep routines can all cause fatigue. Here are some ways to reduce or prevent fatigue:  Eat healthy and drink enough fluid to stay hydrated. Talk with a nutritionist or dietitian who can help you plan the best diet for you.  Stay active and get regular exercise. Check with your cancer care team  first to make sure this is safe.  Get plenty of rest and take it easy. Try taking a few short naps or rest breaks throughout the day.  Tell your health care provider about any cancer-related side effects, including fatigue. He or she may be able to adjust your medicines or treatment to reduce your fatigue. Follow these instructions at home: Lifestyle  Exercise regularly. ? Aim for 30 minutes of moderate exercise 5 times a week, or 60 minutes 3 times a week. ? Examples of moderate exercise include walking, biking, or swimming. Ask your health care provider what activities are safe for you. ? Remember to pace yourself and take short rest breaks.  Find an activity or hobby that you enjoy, and spend 20-30 minutes on it a few times a week. This can help improve your mood, your focus, and your overall mental health.  Get plenty of rest and keep a regular sleep routine. ? Go to bed at the same time every night. ? Only use your bed for sleep. ? Avoid noise during sleep.  Take short naps or rest breaks during the day. Do not nap for more than 20-30 minutes.  Try to lower your stress. If you need help with this, talk with your health care provider about ways to manage stress. Here are some things you can try: ? Do mind and body exercises such as meditation, yoga, tai chi, or qigong. ? Write in a journal. ? Make a to-do list and prioritize only the important tasks.   Eating and drinking  Eat a healthy diet that includes fresh  fruits and vegetables, lean proteins, healthy fats, low-fat dairy products, and whole grains.  Avoid simple, refined sugars, such as soft drinks and sweets.  Try eating small meals and snacks throughout the day. A piece of fruit and some nuts are good choices for snacking.  Try to drink at least 8 glasses of water every day.  Limit the amount of alcohol and caffeine that you drink.   Medicines  Talk with your health care provider about prescription medicines and  over-the-counter vitamins and supplements that you take. Where to find support Your health care provider or cancer care team will be able to give you advice about how to cope with fatigue. They can also guide you in making health and lifestyle changes that may help you feel less tired. Your health care provider may also refer you to a therapist for counseling. Talking with a therapist may help you reduce fatigue by working through any stresses or fears that you may have. Where to find more information There are many trusted resources that can help you learn more about cancer and its side effects. Ask your health care provider what he or she recommends. You can start with:  The American Cancer Society: www.cancer.org  The American Society of Clinical Oncology: www.cancer.net  [employer]: www.cancer.gov Contact a health care provider if:  You feel depressed.  Your fatigue gets worse or does not go away with sleep or rest.  You are unable to sleep at night.  You are thinking about changing your medicines or treatment.  You would like to make changes to your diet or are thinking about taking dietary supplements. Get help right away if:  You feel short of breath, feel dizzy, or have a racing heartbeat after little activity.  You become confused or you are not able to think clearly. Summary  Fatigue is the most common side effect of cancer treatments like chemotherapy and radiation.  Unlike the fatigue that healthy people feel, cancer-related fatigue may not go away with sleep and rest alone.  There are several things you can do to help manage your fatigue, including eating a healthy diet, getting exercise, and keeping a regular sleep routine.  Your health care provider or cancer care team will be able to give you advice about how to cope with fatigue. They can also guide you in making health and lifestyle changes that may help you feel less tired. This information  is not intended to replace advice given to you by your health care provider. Make sure you discuss any questions you have with your health care provider. Document Revised: 09/06/2019 Document Reviewed: 09/06/2019 Elsevier Patient Education  2021 [place].   Mr. Gage was given information about Care Management services today including:  1. Care Management services include personalized support from designated clinical staff supervised by his physician, including individualized plan of care and coordination with other care providers 2. 24/7 contact phone numbers for assistance for urgent and routine care needs. 3. The patient may stop CCM services at any time (effective at the end of the month) by phone call to the office staff.  Patient agreed to services and verbal consent obtained.   The patient verbalized understanding of instructions, educational materials, and care plan provided today and agreed to receive a mailed copy of patient instructions, educational materials, and care plan.   The patient has been provided with contact information for the care management team and has been advised to call with any health related  questions or concerns.  The care management team will reach out to the patient again over the next 30 days.   Quinn Plowman RN,BSN,CCM RN Case Manager [place]  [phone]

## 2020-12-03 NOTE — Chronic Care Management (AMB) (Signed)
Care Management    RN Visit Note  12/03/2020 Name: Mike [name] MRN: [id number] DOB: [birth year]  Subjective: Mike [name] is a 55 y.o. year old male who is a primary care patient of Mike Bush, MD. The care management team was consulted for assistance with disease management and care coordination needs.    Engaged with patient by telephone for initial visit in response to provider referral for case management and/or care coordination services.   Consent to Services:   Mike [name] was given information about Care Management services today including:  1. Care Management services includes personalized support from designated clinical staff supervised by his physician, including individualized plan of care and coordination with other care providers 2. 24/7 contact phone numbers for assistance for urgent and routine care needs. 3. The patient may stop case management services at any time by phone call to the office staff.  Patient agreed to services and consent obtained.   Assessment: Review of patient past medical history, allergies, medications, health status, including review of consultants reports, laboratory and other test data, was performed as part of comprehensive evaluation and provision of chronic care management services.   SDOH (Social Determinants of Health) assessments and interventions performed:  SDOH Interventions   Flowsheet Row Most Recent Value  SDOH Interventions   Depression Interventions/Treatment  Medication       Care Plan  No Known Allergies  Outpatient Encounter Medications as of 12/02/2020  Medication Sig Note  . albuterol (PROVENTIL) (2.5 MG/3ML) 0.083% nebulizer solution Take 3 mLs (2.5 mg total) by nebulization every 6 (six) hours as needed for wheezing or shortness of breath.   [name] atorvastatin (LIPITOR) 20 MG tablet TAKE 1/2 TABLET BY MOUTH DAILY (Patient taking differently: Take 10 mg by mouth daily.)   . dexamethasone (DECADRON) 4 MG  tablet Take 2 tablets TWICE a day the day before, the day of, and the day after chemotherapy.   . diazepam (VALIUM) 5 MG tablet Take 1 tablet (5 mg total) by mouth every 12 (twelve) hours as needed for anxiety or muscle spasms.   [name] ELIQUIS 5 MG TABS tablet TAKE 1 TABLET BY MOUTH TWICE A DAY (Patient taking differently: Take 5 mg by mouth 2 (two) times daily.)   . fentaNYL (DURAGESIC) 100 MCG/HR Place 1 patch onto the skin every 3 (three) days.   . furosemide (LASIX) 20 MG tablet Take 2 tablets (40 mg total) by mouth daily for 7 days, THEN 1 tablet (20 mg total) daily.   [name] lidocaine-prilocaine (EMLA) cream Apply 1 application topically as needed. Apply 1 TBSP over port site  1-2 hours prior to treatment. Do not rub it in. Cover with plastic wrap.   . loratadine (CLARITIN) 10 MG tablet Take 10 mg by mouth daily.   . magic mouthwash SOLN Take 5 mLs by mouth 4 (four) times daily. 12/02/2020: Patient states he uses as needed.   . metFORMIN (GLUCOPHAGE) 500 MG tablet Take 1 tablet (500 mg total) by mouth daily with breakfast.   . methocarbamol (ROBAXIN) 500 MG tablet Take 1 tablet (500 mg total) by mouth every 6 (six) hours as needed for muscle spasms.   . Multiple Vitamin (MULTIVITAMIN) tablet Take 1 tablet by mouth daily.   [name] oxyCODONE-acetaminophen (PERCOCET) 10-325 MG tablet Take 1 tablet by mouth every 8 (eight) hours as needed for up to 15 days for pain.   [name] senna-docusate (SENOKOT-S) 8.6-50 MG tablet Take 2 tablets by mouth 2 (two) times daily as  needed for mild constipation. 12/02/2020: Patient states he takes 1 tablet 2 times per day  . sildenafil (REVATIO) 20 MG tablet Take 1-5 tablets (20-100 mg total) by mouth daily as needed (ED). (Patient taking differently: Take 40 mg by mouth daily as needed (ED).) 12/02/2020: Patient states he takes as needed   . ZIEXTENZO 6 MG/0.6ML injection Inject 6 mg into the skin every 21 ( twenty-one) days.   [name] docusate sodium (COLACE) 100 MG capsule Take 1 capsule (100  mg total) by mouth 2 (two) times daily. (Patient not taking: Reported on 12/02/2020)   . filgrastim-sndz (ZARXIO) 480 MCG/0.8ML SOSY injection Injection 0.8 mLs (480 mcg) total into the skin once daily for two days (Patient not taking: Reported on 12/02/2020)    No facility-administered encounter medications on file as of 12/02/2020.    Patient Active Problem List   Diagnosis Date Noted  . Constipation due to pain medication 11/27/2020  . Protein-calorie malnutrition ([place]) 11/27/2020  . (HFpEF) heart failure with preserved ejection fraction ([place]) 11/27/2020  . DNR (do not resuscitate) 11/27/2020  . Pressure injury of skin 11/08/2020  . Dyspnea and respiratory abnormalities 11/07/2020  . Pleural effusion 11/06/2020  . Cancer related pain 10/08/2020  . Femoral fracture ([place]) 07/24/2020  . Impending pathologic fracture 07/22/2020  . Situational depression 02/22/2020  . UTI due to extended-spectrum beta lactamase (ESBL) producing Escherichia coli 02/10/2020  . Abnormal thyroid function test 02/10/2020  . Port-A-Cath in place 01/29/2020  . Chronic pulmonary embolism ([place]) 01/20/2020  . Stage IV squamous cell carcinoma of right lung ([place]) 12/24/2019  . Advanced directives, counseling/discussion 12/24/2019  . Encounter for antineoplastic chemotherapy 12/24/2019  . Encounter for antineoplastic immunotherapy 12/24/2019  . Rib pain on right side 12/12/2019  . Bony metastasis ([place]) 12/12/2019  . Fatty liver 12/05/2019  . Encounter for well adult exam with abnormal findings 11/25/2019  . GERD (gastroesophageal reflux disease) 11/25/2019  . Dyslipidemia associated with type 2 diabetes mellitus ([place]) 11/25/2019  . Ex-smoker 09/15/2019  . OSA (obstructive sleep apnea) 09/15/2019  . Obesity, Class I, BMI 30-34.9 09/04/2019  . Erectile dysfunction 10/18/2014  . Type 2 diabetes mellitus with other specified complication ([place]) 75/44/9201    Conditions to be addressed/monitored: CHF and Lung  Cancer Patient Care Plan: Cancer Treatment Phase (Adult)  Problem Identified: Fatigue   Priority: High  Onset Date: 12/02/2020  Long-Range Goal: Patient will verbalize decrease symptoms of fatigue   Start Date: 12/02/2020  Expected End Date: 03/02/2021  This Visit's Progress: On track  Priority: High  Current Barriers:  . Chronic Disease Management support, education, and care coordination needs related to  Lung Cancer Clinical Goal(s) related to  Lung Cancer :  Patient will:  . Work with the care management team to address educational, disease management, and care coordination needs  . Begin or continue self health monitoring activities as directed today:  Keep your follow up appointments with your oncologist,  monitor oxygen saturation, notify your doctor for new or increase in symptoms: Oxygen saturation lower than established parameter, Chest pain, and Shortness of breath . Call care management team with questions or concerns. Interventions related to  Lung cancer :  . Evaluation of current treatment plans and patient's adherence to plan as established by provider . Assessed patient understanding of disease state . Assessed patient's education and care coordination needs . Encourage physical activity, as appropriate (walking is a good way to ease fatigue) . Advised patient to take frequent rest breaks. Patient Self Care  Activities related to  Lung cancer :  . - do not eat or exercise right before bedtime . - eat healthy . - get outdoors every day (weather permitting) Walking is great at helping decrease fatigue. . - limit daytime naps . - maintain healthy weight . - take a warm shower or bath before bed . - use meditation or relaxation techniques   Problem Identified: Pain   Priority: High  Onset Date: 12/02/2020  Long-Range Goal: Patient will report satisfactory pain control at a level less than 3 to 4  on a scale of ) to 10   Start Date: 12/02/2020  Expected End Date: 01/31/2021   This Visit's Progress: On track  Priority: High  Current Barriers:  [name] Knowledge Deficits related to self-health management of acute or chronic pain . Chronic Disease Management support and education needs related to chronic pain Clinical Goal(s):  . patient will verbalize understanding of plan for pain management . patient will telephonically meet with RN Care Manager to address pain concerns  . patient will attend all scheduled medical appointments  . patient will use pharmacological and nonpharmacological pain relief strategies as prescribed.  . patient will verbalize acceptable level of pain relief and ability to engage in desired activities Interventions:  . Collaboration with Mike Bush, MD regarding development and update of comprehensive plan of care as evidenced by provider attestation and co-signature . Pain assessment performed . Medications reviewed . Discussed plans with patient for ongoing care management follow up and provided patient with direct contact information for care management team . Provided education to patient regarding pain management strategies. . Reviewed medications with patient and encouraged patient to take pain medications as prescribed. . Reviewed scheduled/upcoming provider appointments. . Discussed plans with patient for ongoing care management follow up and provided patient with direct contact information for care management team Patient Goals/Self Care Activities:  . Will attend all scheduled provider appointments . Will call pharmacy for medication refills 7 days prior to needed refill date . Will calls provider office for new concerns or questions . - develop a personal pain management plan . - do something like read, listen to music or watch television to keep mind off pain . - learn relaxation techniques . - plan exercise or activity when pain is best controlled . - prioritize tasks for the day . - track times pain is worst and when it  is best . - track what makes the pain worse and what makes it better . - use medicine only as doctor says Follow Up Plan: The patient has been provided with contact information for the care management team and has been advised to call with any health related questions or concerns.  The care management team will reach out to the patient again over the next 30 days.    Patient Care Plan: Heart Failure (Adult)  Problem Identified: Symptom Exacerbation (Heart Failure)   Priority: High  Onset Date: 12/02/2020  Long-Range Goal: Symptom Exacerbation Prevented or Minimized   Start Date: 12/02/2020  Expected End Date: 03/02/2021  This Visit's Progress: On track  Priority: High  Current Barriers:  [name] Knowledge deficit related to basic heart failure pathophysiology and self care management Case Manager Clinical Goal(s):  . patient will verbalize understanding of Heart Failure Action Plan and when to call doctor . patient will take all Heart Failure mediations as prescribed . patient will weigh daily and record (notifying MD of 3 lb weight gain over night or 5 lb in  a week) Interventions:  . Collaboration with Mike Bush, MD regarding development and update of comprehensive plan of care as evidenced by provider attestation and co-signature . Inter-disciplinary care team collaboration (see longitudinal plan of care) . Provided verbal and written education on low sodium diet . Reviewed Heart Failure Action Plan in depth and provided written copy . Discussed importance of daily weight and advised patient to weigh and record daily . Reviewed role of diuretics in prevention of fluid overload and management of heart failure Patient Goals/Self-Care Activities - eat more whole grains, fruits and vegetables, lean meats and healthy fats - follow rescue plan if symptoms flare-up - know when to call the doctor - track symptoms and what helps feel better or worse - dress right for the weather, hot or cold   - Review Heart Failure action plan/ zones sent to you in My Chart - Follow up with your providers as recommended - Take your medications as prescribed.  - Monitor for signs/ symptoms of heart failure:  Weight gain of 3 pounds overnight or 5 lbs in a week, swelling in feet, ankles, stomach or hands, chest discomfort or heaviness, more tired/ less energy, dry hacky cough, new or worsening dizziness, uneasy feeling that something is not right.  Follow Up Plan: The patient has been provided with contact information for the care management team and has been advised to call with any health related questions or concerns.  The care management team will reach out to the patient again over the next 30 days.       Plan: The patient has been provided with contact information for the care management team and has been advised to call with any health related questions or concerns.  and The care management team will reach out to the patient again over the next 30 days.  Quinn Plowman RN,BSN,CCM RN Case Manager [place]  [phone]

## 2020-12-04 ENCOUNTER — Other Ambulatory Visit: Payer: Self-pay | Admitting: Family Medicine

## 2020-12-04 ENCOUNTER — Other Ambulatory Visit: Payer: Self-pay

## 2020-12-04 ENCOUNTER — Inpatient Hospital Stay (HOSPITAL_BASED_OUTPATIENT_CLINIC_OR_DEPARTMENT_OTHER): Payer: 59 | Admitting: Internal Medicine

## 2020-12-04 ENCOUNTER — Telehealth: Payer: Self-pay | Admitting: Medical Oncology

## 2020-12-04 ENCOUNTER — Inpatient Hospital Stay: Payer: 59

## 2020-12-04 VITALS — BP 118/75 | HR 91 | Temp 97.8°F | Resp 16 | Ht 72.0 in | Wt 230.9 lb

## 2020-12-04 DIAGNOSIS — C7951 Secondary malignant neoplasm of bone: Secondary | ICD-10-CM

## 2020-12-04 DIAGNOSIS — Z95828 Presence of other vascular implants and grafts: Secondary | ICD-10-CM

## 2020-12-04 DIAGNOSIS — Z5111 Encounter for antineoplastic chemotherapy: Secondary | ICD-10-CM | POA: Diagnosis not present

## 2020-12-04 DIAGNOSIS — R946 Abnormal results of thyroid function studies: Secondary | ICD-10-CM

## 2020-12-04 DIAGNOSIS — E440 Moderate protein-calorie malnutrition: Secondary | ICD-10-CM

## 2020-12-04 DIAGNOSIS — Z5112 Encounter for antineoplastic immunotherapy: Secondary | ICD-10-CM | POA: Diagnosis not present

## 2020-12-04 DIAGNOSIS — C3491 Malignant neoplasm of unspecified part of right bronchus or lung: Secondary | ICD-10-CM

## 2020-12-04 DIAGNOSIS — E1169 Type 2 diabetes mellitus with other specified complication: Secondary | ICD-10-CM

## 2020-12-04 LAB — CBC WITH DIFFERENTIAL (CANCER CENTER ONLY)
Abs Immature Granulocytes: 0.06 10*3/uL (ref 0.00–0.07)
Basophils Absolute: 0 10*3/uL (ref 0.0–0.1)
Basophils Relative: 0 %
Eosinophils Absolute: 0 10*3/uL (ref 0.0–0.5)
Eosinophils Relative: 0 %
HCT: 33 % — ABNORMAL LOW (ref 39.0–52.0)
Hemoglobin: 10.5 g/dL — ABNORMAL LOW (ref 13.0–17.0)
Immature Granulocytes: 0 %
Lymphocytes Relative: 6 %
Lymphs Abs: 0.8 10*3/uL (ref 0.7–4.0)
MCH: 29.3 pg (ref 26.0–34.0)
MCHC: 31.8 g/dL (ref 30.0–36.0)
MCV: 92.2 fL (ref 80.0–100.0)
Monocytes Absolute: 0.8 10*3/uL (ref 0.1–1.0)
Monocytes Relative: 6 %
Neutro Abs: 13.1 10*3/uL — ABNORMAL HIGH (ref 1.7–7.7)
Neutrophils Relative %: 88 %
Platelet Count: 251 10*3/uL (ref 150–400)
RBC: 3.58 MIL/uL — ABNORMAL LOW (ref 4.22–5.81)
RDW: 20.3 % — ABNORMAL HIGH (ref 11.5–15.5)
WBC Count: 14.8 10*3/uL — ABNORMAL HIGH (ref 4.0–10.5)
nRBC: 0 % (ref 0.0–0.2)

## 2020-12-04 LAB — CMP (CANCER CENTER ONLY)
ALT: 9 U/L (ref 0–44)
AST: 14 U/L — ABNORMAL LOW (ref 15–41)
Albumin: 2.4 g/dL — ABNORMAL LOW (ref 3.5–5.0)
Alkaline Phosphatase: 77 U/L (ref 38–126)
Anion gap: 10 (ref 5–15)
BUN: 11 mg/dL (ref 6–20)
CO2: 26 mmol/L (ref 22–32)
Calcium: 9.1 mg/dL (ref 8.9–10.3)
Chloride: 101 mmol/L (ref 98–111)
Creatinine: 0.67 mg/dL (ref 0.61–1.24)
GFR, Estimated: >60 mL/min (ref >60)
Glucose, Bld: 209 mg/dL — ABNORMAL HIGH (ref 70–99)
Potassium: 4.3 mmol/L (ref 3.5–5.1)
Sodium: 137 mmol/L (ref 135–145)
Total Bilirubin: 0.2 mg/dL — ABNORMAL LOW (ref 0.3–1.2)
Total Protein: 6.6 g/dL (ref 6.5–8.1)

## 2020-12-04 LAB — TOTAL PROTEIN, URINE DIPSTICK: Protein, ur: NEGATIVE mg/dL

## 2020-12-04 MED ORDER — SODIUM CHLORIDE 0.9 % IV SOLN
75.0000 mg/m2 | Freq: Once | INTRAVENOUS | Status: AC
  Administered 2020-12-04: 180 mg via INTRAVENOUS
  Filled 2020-12-04: qty 18

## 2020-12-04 MED ORDER — DIPHENHYDRAMINE HCL 50 MG/ML IJ SOLN
50.0000 mg | Freq: Once | INTRAMUSCULAR | Status: AC
  Administered 2020-12-04: 50 mg via INTRAVENOUS

## 2020-12-04 MED ORDER — SODIUM CHLORIDE 0.9% FLUSH
10.0000 mL | INTRAVENOUS | Status: DC | PRN
Start: 2020-12-04 — End: 2020-12-04
  Administered 2020-12-04: 10 mL
  Filled 2020-12-04: qty 10

## 2020-12-04 MED ORDER — SODIUM CHLORIDE 0.9 % IV SOLN
10.0000 mg/kg | Freq: Once | INTRAVENOUS | Status: AC
  Administered 2020-12-04: 1100 mg via INTRAVENOUS
  Filled 2020-12-04: qty 100

## 2020-12-04 MED ORDER — DIPHENHYDRAMINE HCL 50 MG/ML IJ SOLN
INTRAMUSCULAR | Status: AC
  Filled 2020-12-04: qty 1

## 2020-12-04 MED ORDER — HEPARIN SOD (PORK) LOCK FLUSH 100 UNIT/ML IV SOLN
500.0000 [IU] | Freq: Once | INTRAVENOUS | Status: AC | PRN
  Administered 2020-12-04: 500 [IU]
  Filled 2020-12-04: qty 5

## 2020-12-04 MED ORDER — SODIUM CHLORIDE 0.9% FLUSH
10.0000 mL | Freq: Once | INTRAVENOUS | Status: AC
  Administered 2020-12-04: 10 mL
  Filled 2020-12-04: qty 10

## 2020-12-04 MED ORDER — SODIUM CHLORIDE 0.9 % IV SOLN
10.0000 mg | Freq: Once | INTRAVENOUS | Status: AC
  Administered 2020-12-04: 10 mg via INTRAVENOUS
  Filled 2020-12-04: qty 10

## 2020-12-04 MED ORDER — ACETAMINOPHEN 325 MG PO TABS
650.0000 mg | ORAL_TABLET | Freq: Once | ORAL | Status: AC
  Administered 2020-12-04: 650 mg via ORAL

## 2020-12-04 MED ORDER — SODIUM CHLORIDE 0.9 % IV SOLN
Freq: Once | INTRAVENOUS | Status: AC
  Filled 2020-12-04: qty 250

## 2020-12-04 MED ORDER — ACETAMINOPHEN 325 MG PO TABS
ORAL_TABLET | ORAL | Status: AC
  Filled 2020-12-04: qty 2

## 2020-12-04 NOTE — Progress Notes (Signed)
[facility] [phone]   [phone]  OFFICE PROGRESS NOTE  [name], MD [place] [address]  DIAGNOSIS: Stage IV (T2b, N3, M1C) non-small cell lung cancer, squamous cell carcinoma presented with right upper lobe lung mass in addition to mediastinal and right supraclavicular lymphadenopathy as well as multiple metastatic bone lesions diagnosed in March 2021.  PRIOR THERAPY: 1)Palliative radiotherapy to the painful metastatic bone lesions under the care of Dr. Sondra Come. Last dose 01/14/20 2)Palliative systemic chemotherapy withcarboplatin for AUC of 5, paclitaxel 175 NG/M2 and Keytruda 200 mg IV every 3 weeks with Neulasta support.Last dose on11/23/21. Status post9cycles.Starting from cycle #5 he will be on maintenance single agent Keytruda  CURRENT THERAPY: Palliative systemic chemotherapy with docetaxel 75 mg/m2 and Cyramza 10 mg/kg IV every 3 weeks with Neulasta support. First dose expected on 10/03/20.Status post 3 cycles  INTERVAL HISTORY: RONALD [name] 55 y.o. male returns to the clinic today for follow-up visit.  The patient is feeling fine today with no concerning complaints.  He had a history of constipation and he tried milk of magnesia which improved his constipation but he was feeling so bad with many episodes of diarrhea at that time.  Is feeling much better now.  He denied having any current chest pain, shortness of breath except with exertion with no cough or hemoptysis.  He has no nausea, vomiting, diarrhea or constipation.  He has no headache or visual changes.  He has no bleeding issues.  He continues to tolerate his systemic chemotherapy fairly well.  The patient is here today for evaluation before starting cycle #4.   MEDICAL HISTORY: Past Medical History:  Diagnosis Date  . Anemia   . Arthritis    neck  . Cancer, metastatic to bone ([place]) dx'd 10/2019  . Clotting disorder ([place])     previous blood clot  . Diabetes ([place])   . Fatty liver 12/05/2019   By [place] 11/2019  . GERD (gastroesophageal reflux disease)   . 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 High cholesterol   . IBS (irritable bowel syndrome)   . lung ca dx'd 10/2019   lung stage 4   . OSA (obstructive sleep apnea) 09/15/2019   Sleep study 12/2017 - severe OSA with AHI 65.6, desat to 76% rec CPAP  . Severe obesity (BMI 35.0-39.9) with comorbidity ([place]) 09/04/2019    ALLERGIES:  has No Known Allergies.  MEDICATIONS:  Current Outpatient Medications  Medication Sig Dispense Refill  . albuterol (PROVENTIL) (2.5 MG/3ML) 0.083% nebulizer solution Take 3 mLs (2.5 mg total) by nebulization every 6 (six) hours as needed for wheezing or shortness of breath. 75 mL 12  . atorvastatin (LIPITOR) 20 MG tablet TAKE 1/2 TABLET BY MOUTH DAILY (Patient taking differently: Take 10 mg by mouth daily.) 45 tablet 0  . dexamethasone (DECADRON) 4 MG tablet Take 2 tablets TWICE a day the day before, the day of, and the day after chemotherapy. 60 tablet 2  . diazepam (VALIUM) 5 MG tablet Take 1 tablet (5 mg total) by mouth every 12 (twelve) hours as needed for anxiety or muscle spasms. 30 tablet 0  . docusate sodium (COLACE) 100 MG capsule Take 1 capsule (100 mg total) by mouth 2 (two) times daily. (Patient not taking: Reported on 12/02/2020) 10 capsule 0  . ELIQUIS 5 MG TABS tablet TAKE 1 TABLET BY MOUTH TWICE A DAY (Patient taking differently: Take 5  mg by mouth 2 (two) times daily.) 60 tablet 3  . fentaNYL (DURAGESIC) 100 MCG/HR Place 1 patch onto the skin every 3 (three) days. 5 patch 0  . filgrastim-sndz (ZARXIO) 480 MCG/0.8ML SOSY injection Injection 0.8 mLs (480 mcg) total into the skin once daily for two days (Patient not taking: Reported on 12/02/2020) 1.6 mL 0  . furosemide (LASIX) 20 MG tablet Take 2 tablets (40 mg total) by mouth daily for 7 days, THEN 1 tablet (20 mg total) daily. 97 tablet 0  . lidocaine-prilocaine (EMLA)  cream Apply 1 application topically as needed. Apply 1 TBSP over port site  1-2 hours prior to treatment. Do not rub it in. Cover with plastic wrap. 30 g 0  . loratadine (CLARITIN) 10 MG tablet Take 10 mg by mouth daily.    . magic mouthwash SOLN Take 5 mLs by mouth 4 (four) times daily. 240 mL 0  . metFORMIN (GLUCOPHAGE) 500 MG tablet Take 1 tablet (500 mg total) by mouth daily with breakfast. 90 tablet 1  . methocarbamol (ROBAXIN) 500 MG tablet Take 1 tablet (500 mg total) by mouth every 6 (six) hours as needed for muscle spasms. 60 tablet 6  . Multiple Vitamin (MULTIVITAMIN) tablet Take 1 tablet by mouth daily.    [name] oxyCODONE-acetaminophen (PERCOCET) 10-325 MG tablet Take 1 tablet by mouth every 8 (eight) hours as needed for up to 15 days for pain. 40 tablet 0  . senna-docusate (SENOKOT-S) 8.6-50 MG tablet Take 2 tablets by mouth 2 (two) times daily as needed for mild constipation.    . sildenafil (REVATIO) 20 MG tablet Take 1-5 tablets (20-100 mg total) by mouth daily as needed (ED). (Patient taking differently: Take 40 mg by mouth daily as needed (ED).) 30 tablet 3  . ZIEXTENZO 6 MG/0.6ML injection Inject 6 mg into the skin every 21 ( twenty-one) days.     No current facility-administered medications for this visit.    SURGICAL HISTORY:  Past Surgical History:  Procedure Laterality Date  . BIOPSY  12/18/2019   Procedure: BIOPSY;  Surgeon: Garner Nash, DO;  Location: [facility];  Service: Pulmonary;;  . BRONCHIAL BRUSHINGS  12/18/2019   Procedure: BRONCHIAL BRUSHINGS;  Surgeon: Garner Nash, DO;  Location: [facility];  Service: Pulmonary;;  . BRONCHIAL WASHINGS  12/18/2019   Procedure: BRONCHIAL WASHINGS;  Surgeon: Garner Nash, DO;  Location: [facility];  Service: Pulmonary;;  . COLONOSCOPY  10/2019   mult TA, int hem, rpt 1 yr ([place])  . CYSTECTOMY     knee- left knee cyst  . ENDOBRONCHIAL ULTRASOUND  12/18/2019   Procedure: ENDOBRONCHIAL ULTRASOUND;  Surgeon: Garner Nash, DO;  Location: [facility];  Service: Pulmonary;;  . FINE NEEDLE ASPIRATION  12/18/2019   Procedure: FINE NEEDLE ASPIRATION;  Surgeon: Garner Nash, DO;  Location: [facility];  Service: Pulmonary;;  . FRACTURE SURGERY     left femur fracture  . INTRAMEDULLARY (IM) NAIL INTERTROCHANTERIC Left 07/25/2020   Procedure: LEFT INTERTROCHANTERIC INTRAMEDULLARY (IM) NAIL;  Surgeon: Leandrew Koyanagi, MD;  Location: [facility];  Service: Orthopedics;  Laterality: Left;  . IR IMAGING GUIDED PORT INSERTION  12/27/2019  . PILONIDAL CYST EXCISION    . VIDEO BRONCHOSCOPY WITH ENDOBRONCHIAL ULTRASOUND Right 12/18/2019   Procedure: VIDEO BRONCHOSCOPY;  Surgeon: Garner Nash, DO;  Location: [facility];  Service: Pulmonary;  Laterality: Right;    REVIEW OF SYSTEMS:  A comprehensive review of systems was negative except for: Constitutional: positive for  fatigue Respiratory: positive for dyspnea on exertion Musculoskeletal: positive for bone pain and muscle weakness   PHYSICAL EXAMINATION: General appearance: alert, cooperative, fatigued and no distress Head: Normocephalic, without obvious abnormality, atraumatic Neck: no adenopathy, no JVD, supple, symmetrical, trachea midline and thyroid not enlarged, symmetric, no tenderness/mass/nodules Lymph nodes: Cervical, supraclavicular, and axillary nodes normal. Resp: clear to auscultation bilaterally Back: symmetric, no curvature. ROM normal. No CVA tenderness. Cardio: regular rate and rhythm, S1, S2 normal, no murmur, click, rub or gallop GI: soft, non-tender; bowel sounds normal; no masses,  no organomegaly Extremities: extremities normal, atraumatic, no cyanosis or edema  ECOG PERFORMANCE STATUS: 1 - Symptomatic but completely ambulatory  Blood pressure 118/75, pulse 91, temperature 97.8 F (36.6 C), temperature source Tympanic, resp. rate 16, height 6' (1.829 m), weight 230 lb 14.4 oz (104.7 kg), SpO2 100 %.  LABORATORY DATA: Lab Results   Component Value Date   WBC 14.8 (H) 12/04/2020   HGB 10.5 (L) 12/04/2020   HCT 33.0 (L) 12/04/2020   MCV 92.2 12/04/2020   PLT 251 12/04/2020      Chemistry      Component Value Date/Time   NA 139 11/27/2020 1210   K 4.1 11/27/2020 1210   CL 102 11/27/2020 1210   CO2 28 11/27/2020 1210   BUN 5 (L) 11/27/2020 1210   CREATININE 0.60 (L) 11/27/2020 1210      Component Value Date/Time   CALCIUM 9.0 11/27/2020 1210   ALKPHOS 86 11/27/2020 1210   AST 17 11/27/2020 1210   ALT 8 11/27/2020 1210   BILITOT 0.3 11/27/2020 1210       RADIOGRAPHIC STUDIES: DG Chest 1 View  Result Date: 11/07/2020 CLINICAL DATA:  Status post thoracentesis. EXAM: CHEST  1 VIEW COMPARISON:  11/06/2020 FINDINGS: There is a left chest wall port a catheter with tip in the projection of the cavoatrial junction. Normal heart size. Decreased volume of right pleural effusion status post thoracentesis. No significant pneumothorax identified. Similar appearance of right upper lobe cavitary lung lesion measuring approximately 4 cm. IMPRESSION: 1. Decreased volume of right pleural effusion status post thoracentesis. 2. No pneumothorax. Electronically Signed   By: Kerby Moors M.D.   On: 11/07/2020 12:13   CT Angio Chest PE W/Cm &/Or Wo Cm  Result Date: 11/06/2020 CLINICAL DATA:  Stage IV right lung cancer. History of pulmonary embolism. Worsening left abdominal pain and diarrhea for few days. Hypoxia. EXAM: CT ANGIOGRAPHY CHEST CT ABDOMEN AND PELVIS WITH CONTRAST TECHNIQUE: Multidetector CT imaging of the chest was performed using the standard protocol during bolus administration of intravenous contrast. Multiplanar CT image reconstructions and MIPs were obtained to evaluate the vascular anatomy. Multidetector CT imaging of the abdomen and pelvis was performed using the standard protocol during bolus administration of intravenous contrast. CONTRAST:  144mL OMNIPAQUE IOHEXOL 350 MG/ML SOLN COMPARISON:  10/10/2020 chest CT  angiogram and CT abdomen/pelvis. FINDINGS: CTA CHEST FINDINGS Cardiovascular: The study is low to moderate quality for the evaluation of pulmonary embolism, with significant motion degradation. Chronic eccentric small nonocclusive bilateral pulmonary emboli in the peripheral right pulmonary artery (series 5/image 132) and segmental left lower lobe pulmonary artery branches (series 5/image 133), unchanged. Atherosclerotic nonaneurysmal thoracic aorta. Top-normal caliber main pulmonary artery (3.3 cm diameter). Normal heart size. Stable trace pericardial effusion/thickening. Left internal jugular Port-A-Cath terminates at the cavoatrial junction. Mediastinum/Nodes: No discrete thyroid nodules. Unremarkable esophagus. Mildly enlarged 1.0 cm left supraclavicular node (series 4/image 11), stable. No axillary adenopathy. Mildly enlarged 1.0 cm right hilar  node (series 4/image 43), stable. No left hilar adenopathy. Mildly enlarged 1.0 cm AP window node (series 4/image 32), stable. Lungs/Pleura: No pneumothorax. Moderate dependent right pleural effusion largely new. No left pleural effusion. Cavitary irregular 4.5 x 2.8 cm posterior right upper lobe lung mass (series 6/image 49), previously 4.5 x 2.7 cm using similar measurement technique, stable. Passive atelectasis in the dependent right lower lobe. No new significant pulmonary nodules. Musculoskeletal: Expansile lytic lesion in the medial left clavicle measures 5.4 cm (series 6/image 19), increased from 4.7 cm on 10/10/2020 CT. Lytic 2.2 cm left humeral head lesion (series 6/image 6), increased from 1.8 cm. Stable lytic sternal lesion. Stable lytic expansile 5.6 cm right T11 posterior elements lesion (series 6/image 131) with stable mild superior T11 vertebral compression fracture. No new focal osseous lesions in the chest. Moderate thoracic spondylosis. Mild gynecomastia asymmetric to the right, stable. Review of the MIP images confirms the above findings. CT ABDOMEN  and PELVIS FINDINGS Hepatobiliary: Three scattered hypodense right liver masses, stable to mildly increased. Segment 7 right liver 1.3 cm mass (series 1/image 15), increased from 0.8 cm. Inferior right liver 4.1 cm mass adjacent to the gallbladder (series 1/image 28), slightly increased from 3.8 cm. Far inferior right liver 3.3 cm mass (series 1/image 37), stable. Normal gallbladder with no radiopaque cholelithiasis. No biliary ductal dilatation. Pancreas: Normal, with no mass or duct dilation. Spleen: Normal size. No mass. Adrenals/Urinary Tract: Normal adrenals. Normal kidneys with no hydronephrosis and no renal mass. Normal bladder. Stomach/Bowel: Normal non-distended stomach. Normal caliber small bowel with no small bowel wall thickening. Normal appendix. Moderate colonic stool. No large bowel wall thickening, diverticulosis or significant pericolonic fat stranding. Mild presacral space fat stranding and ill-defined fluid is not significantly changed and is nonspecific. Vascular/Lymphatic: Atherosclerotic abdominal aorta with ectatic 2.8 cm infrarenal abdominal aorta, stable. Patent portal, splenic, hepatic and renal veins. No pathologically enlarged lymph nodes in the abdomen or pelvis. Reproductive: Normal size prostate. Other: No pneumoperitoneum, ascites or focal fluid collection. Musculoskeletal: Numerous lytic lesions throughout the pelvis including right upper and lower left sacrum, medial left iliac bone, right iliac wing and posterior right acetabulum, not appreciably changed since 10/10/2020 CT. No new focal osseous lesions. Mild lumbar spondylosis. Review of the MIP images confirms the above findings. IMPRESSION: 1. Limited motion degraded scan. No evidence of acute pulmonary embolism. Chronic eccentric small nonocclusive bilateral pulmonary emboli, unchanged. 2. Cavitary posterior right upper lobe lung mass, stable. 3. Moderate dependent right pleural effusion, largely new. 4. Widespread lytic  osseous metastases throughout the axial and proximal appendicular skeleton, stable to mildly increased since 10/10/2020 CT as detailed. 5. Right liver metastases are stable to mildly increased. 6. Moderate colonic stool volume, suggesting constipation. No evidence of bowel obstruction or acute bowel inflammation. 7. Stable ectatic 2.8 cm infrarenal abdominal aorta. Recommend follow-up ultrasound every 5 years. This recommendation follows ACR consensus guidelines: White Paper of the ACR Incidental Findings Committee II on Vascular Findings. J Am Coll Radiol 2013; 10:789-794. 8. Aortic Atherosclerosis (ICD10-I70.0). Electronically Signed   By: Ilona Sorrel M.D.   On: 11/06/2020 11:51   CT Abdomen Pelvis W Contrast  Result Date: 11/06/2020 CLINICAL DATA:  Stage IV right lung cancer. History of pulmonary embolism. Worsening left abdominal pain and diarrhea for few days. Hypoxia. EXAM: CT ANGIOGRAPHY CHEST CT ABDOMEN AND PELVIS WITH CONTRAST TECHNIQUE: Multidetector CT imaging of the chest was performed using the standard protocol during bolus administration of intravenous contrast. Multiplanar CT image reconstructions and MIPs  were obtained to evaluate the vascular anatomy. Multidetector CT imaging of the abdomen and pelvis was performed using the standard protocol during bolus administration of intravenous contrast. CONTRAST:  142mL OMNIPAQUE IOHEXOL 350 MG/ML SOLN COMPARISON:  10/10/2020 chest CT angiogram and CT abdomen/pelvis. FINDINGS: CTA CHEST FINDINGS Cardiovascular: The study is low to moderate quality for the evaluation of pulmonary embolism, with significant motion degradation. Chronic eccentric small nonocclusive bilateral pulmonary emboli in the peripheral right pulmonary artery (series 5/image 132) and segmental left lower lobe pulmonary artery branches (series 5/image 133), unchanged. Atherosclerotic nonaneurysmal thoracic aorta. Top-normal caliber main pulmonary artery (3.3 cm diameter). Normal heart  size. Stable trace pericardial effusion/thickening. Left internal jugular Port-A-Cath terminates at the cavoatrial junction. Mediastinum/Nodes: No discrete thyroid nodules. Unremarkable esophagus. Mildly enlarged 1.0 cm left supraclavicular node (series 4/image 11), stable. No axillary adenopathy. Mildly enlarged 1.0 cm right hilar node (series 4/image 43), stable. No left hilar adenopathy. Mildly enlarged 1.0 cm AP window node (series 4/image 32), stable. Lungs/Pleura: No pneumothorax. Moderate dependent right pleural effusion largely new. No left pleural effusion. Cavitary irregular 4.5 x 2.8 cm posterior right upper lobe lung mass (series 6/image 49), previously 4.5 x 2.7 cm using similar measurement technique, stable. Passive atelectasis in the dependent right lower lobe. No new significant pulmonary nodules. Musculoskeletal: Expansile lytic lesion in the medial left clavicle measures 5.4 cm (series 6/image 19), increased from 4.7 cm on 10/10/2020 CT. Lytic 2.2 cm left humeral head lesion (series 6/image 6), increased from 1.8 cm. Stable lytic sternal lesion. Stable lytic expansile 5.6 cm right T11 posterior elements lesion (series 6/image 131) with stable mild superior T11 vertebral compression fracture. No new focal osseous lesions in the chest. Moderate thoracic spondylosis. Mild gynecomastia asymmetric to the right, stable. Review of the MIP images confirms the above findings. CT ABDOMEN and PELVIS FINDINGS Hepatobiliary: Three scattered hypodense right liver masses, stable to mildly increased. Segment 7 right liver 1.3 cm mass (series 1/image 15), increased from 0.8 cm. Inferior right liver 4.1 cm mass adjacent to the gallbladder (series 1/image 28), slightly increased from 3.8 cm. Far inferior right liver 3.3 cm mass (series 1/image 37), stable. Normal gallbladder with no radiopaque cholelithiasis. No biliary ductal dilatation. Pancreas: Normal, with no mass or duct dilation. Spleen: Normal size. No mass.  Adrenals/Urinary Tract: Normal adrenals. Normal kidneys with no hydronephrosis and no renal mass. Normal bladder. Stomach/Bowel: Normal non-distended stomach. Normal caliber small bowel with no small bowel wall thickening. Normal appendix. Moderate colonic stool. No large bowel wall thickening, diverticulosis or significant pericolonic fat stranding. Mild presacral space fat stranding and ill-defined fluid is not significantly changed and is nonspecific. Vascular/Lymphatic: Atherosclerotic abdominal aorta with ectatic 2.8 cm infrarenal abdominal aorta, stable. Patent portal, splenic, hepatic and renal veins. No pathologically enlarged lymph nodes in the abdomen or pelvis. Reproductive: Normal size prostate. Other: No pneumoperitoneum, ascites or focal fluid collection. Musculoskeletal: Numerous lytic lesions throughout the pelvis including right upper and lower left sacrum, medial left iliac bone, right iliac wing and posterior right acetabulum, not appreciably changed since 10/10/2020 CT. No new focal osseous lesions. Mild lumbar spondylosis. Review of the MIP images confirms the above findings. IMPRESSION: 1. Limited motion degraded scan. No evidence of acute pulmonary embolism. Chronic eccentric small nonocclusive bilateral pulmonary emboli, unchanged. 2. Cavitary posterior right upper lobe lung mass, stable. 3. Moderate dependent right pleural effusion, largely new. 4. 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  5. Right liver metastases are stable to mildly increased. 6. Moderate colonic stool volume, suggesting constipation. No evidence of bowel obstruction or acute bowel inflammation. 7. Stable ectatic 2.8 cm infrarenal abdominal aorta. Recommend follow-up ultrasound every 5 years. This recommendation follows ACR consensus guidelines: White Paper of the ACR Incidental Findings Committee II on Vascular  Findings. J Am Coll Radiol 2013; 10:789-794. 8. Aortic Atherosclerosis (ICD10-I70.0). Electronically Signed   By: Ilona Sorrel M.D.   On: 11/06/2020 11:51   DG Chest Port 1 View  Result Date: 11/06/2020 CLINICAL DATA:  Shortness of breath. EXAM: PORTABLE CHEST 1 VIEW COMPARISON:  January 20, 2020. FINDINGS: The heart size and mediastinal contours are within normal limits. No pneumothorax is noted. Left internal jugular Port-A-Cath is unchanged in position. Left lung is clear. Right upper lobe opacity is enlarged concerning for enlarging mass. Increased right lower lobe opacity is noted concerning for atelectasis with associated effusion. The visualized skeletal structures are unremarkable. IMPRESSION: Right upper lobe opacity is enlarged concerning for enlarging mass. Increased right lower lobe opacity is noted concerning for atelectasis or pneumonia with associated effusion. Electronically Signed   By: Marijo Conception M.D.   On: 11/06/2020 13:07   ECHOCARDIOGRAM COMPLETE  Result Date: 11/07/2020    ECHOCARDIOGRAM REPORT   Patient Name:   Mike [name] [facility] Date of Exam: 11/07/2020 Medical Rec #:  [id number]       Height:       71.0 in Accession #:    [id number]      Weight:       246.6 lb Date of Birth:  05-19-1966       BSA:          2.305 m Patient Age:    17 years        BP:           138/91 mmHg Patient Gender: M               HR:           96 bpm. Exam Location:  Inpatient Procedure: 2D Echo, Cardiac Doppler and Color Doppler Indications:    CHF  History:        Patient has prior history of Echocardiogram examinations, most                 recent 01/20/2020. Signs/Symptoms:Shortness of Breath and Pleural                 effusion; Risk Factors:Dyslipidemia, Diabetes and Obesity.                 Metastaic lung cancer, chemo.  Sonographer:    Dustin Flock Referring Phys: [place]  Sonographer Comments: Technically difficult study due to poor echo windows and patient is morbidly obese. Image  acquisition challenging due to respiratory motion. Difficult images due to lung cancer and patient unable to roll onto left side. IMPRESSIONS  1. Endocardial border definition is poor. However, systolic function appears to be normal. Left ventricular ejection fraction, by estimation, is 55 to 60%. The left ventricle has normal function. The left ventricle has no regional wall motion abnormalities. Left ventricular diastolic parameters are consistent with Grade I diastolic dysfunction (impaired relaxation).  2. Right ventricular systolic function is normal. The right ventricular size is normal.  3. The mitral valve is normal in structure. No evidence of mitral valve regurgitation. No evidence of mitral stenosis.  4. The aortic valve is normal in structure. Aortic valve  regurgitation is not visualized. No aortic stenosis is present.  5. The inferior vena cava is dilated in size with >50% respiratory variability, suggesting right atrial pressure of 8 mmHg. FINDINGS  Left Ventricle: Endocardial border definition is poor. However, systolic function appears to be normal. Left ventricular ejection fraction, by estimation, is 55 to 60%. The left ventricle has normal function. The left ventricle has no regional wall motion abnormalities. The left ventricular internal cavity size was normal in size. There is no left ventricular hypertrophy. Left ventricular diastolic parameters are consistent with Grade I diastolic dysfunction (impaired relaxation). Normal left ventricular filling pressure. Right Ventricle: The right ventricular size is normal. No increase in right ventricular wall thickness. Right ventricular systolic function is normal. Left Atrium: Left atrial size was normal in size. Right Atrium: Right atrial size was normal in size. Pericardium: There is no evidence of pericardial effusion. Mitral Valve: The mitral valve is normal in structure. No evidence of mitral valve regurgitation. No evidence of mitral valve  stenosis. Tricuspid Valve: The tricuspid valve is normal in structure. Tricuspid valve regurgitation is not demonstrated. No evidence of tricuspid stenosis. Aortic Valve: The aortic valve is normal in structure. Aortic valve regurgitation is not visualized. No aortic stenosis is present. Pulmonic Valve: The pulmonic valve was normal in structure. Pulmonic valve regurgitation is not visualized. No evidence of pulmonic stenosis. Aorta: The aortic root is normal in size and structure. Venous: The inferior vena cava is dilated in size with greater than 50% respiratory variability, suggesting right atrial pressure of 8 mmHg. IAS/Shunts: No atrial level shunt detected by color flow Doppler.  LEFT VENTRICLE PLAX 2D LVIDd:         3.40 cm  Diastology LVIDs:         2.90 cm  LV e' medial:    9.20 cm/s LV PW:         1.30 cm  LV E/e' medial:  6.2 LV IVS:        1.40 cm  LV e' lateral:   6.53 cm/s LVOT diam:     2.30 cm  LV E/e' lateral: 8.7 LV SV:         59 LV SV Index:   26 LVOT Area:     4.15 cm  RIGHT VENTRICLE RV Basal diam:  3.30 cm RV S prime:     15.10 cm/s TAPSE (M-mode): 2.5 cm LEFT ATRIUM             Index       RIGHT ATRIUM           Index LA diam:        3.60 cm 1.56 cm/m  RA Area:     19.90 cm LA Vol (A2C):   41.6 ml 18.05 ml/m RA Volume:   62.30 ml  27.03 ml/m LA Vol (A4C):   66.0 ml 28.64 ml/m LA Biplane Vol: 54.2 ml 23.52 ml/m  AORTIC VALVE LVOT Vmax:   93.10 cm/s LVOT Vmean:  62.200 cm/s LVOT VTI:    0.142 m  AORTA Ao Root diam: 3.50 cm MITRAL VALVE MV Area (PHT): 7.66 cm     SHUNTS MV Decel Time: 99 msec      Systemic VTI:  0.14 m MV E velocity: 56.80 cm/s   Systemic Diam: 2.30 cm MV A velocity: 109.00 cm/s MV E/A ratio:  0.52 Skeet Latch MD Electronically signed by Skeet Latch MD Signature Date/Time: 11/07/2020/12:02:36 PM    Final    US THORACENTESIS ASP  PLEURAL SPACE W/IMG GUIDE  Result Date: 11/07/2020 INDICATION: Patient with history of stage IV lung cancer, back/shoulder pain,  pulmonary emboli, right pleural effusion. Request received for diagnostic and therapeutic right thoracentesis. EXAM: ULTRASOUND GUIDED DIAGNOSTIC AND THERAPEUTIC RIGHT THORACENTESIS MEDICATIONS: 1% lidocaine to skin and subcutaneous tissue COMPLICATIONS: None immediate. PROCEDURE: An ultrasound guided thoracentesis was thoroughly discussed with the patient and questions answered. The benefits, risks, alternatives and complications were also discussed. The patient understands and wishes to proceed with the procedure. Written consent was obtained. Ultrasound was performed to localize and mark an adequate pocket of fluid in the right chest. The area was then prepped and draped in the normal sterile fashion. 1% Lidocaine was used for local anesthesia. Under ultrasound guidance a 6 Fr Safe-T-Centesis catheter was introduced. Thoracentesis was performed. The catheter was removed and a dressing applied. FINDINGS: A total of approximately 1.1 liters of yellow fluid was removed. Samples were sent to the laboratory as requested by the clinical team. IMPRESSION: Successful ultrasound guided diagnostic and therapeutic right thoracentesis yielding 1.1 liters of pleural fluid. Read by: Rowe Robert, PA-C Electronically Signed   By: Sandi Mariscal M.D.   On: 11/07/2020 11:37    ASSESSMENT AND PLAN: This is a very pleasant 55 years old white male with a stage IV non-small cell lung cancer, squamous cell carcinoma presented with right upper lobe lung mass in addition to mediastinal and right supraclavicular lymphadenopathy and multiple metastatic bone lesions diagnosed in March 2021 status post palliative radiotherapy to the painful metastatic lesions under the care of Dr. Sondra Come. The patient is currently undergoing palliative systemic chemotherapy initially with carboplatin, paclitaxel and Keytruda for 4 cycles and he is currently on the maintenance phase of his treatment with single agent Keytruda status post 5 more cycles.  This  treatment was discontinued secondary to disease progression. The patient is currently undergoing second line chemotherapy with docetaxel 75 mg/M2 and Cyramza 10 mg/KG every 3 weeks with Neulasta support status post 3 cycles. The patient continues to tolerate this treatment well with no concerning adverse effects. I recommended for him to proceed with cycle #4 today as planned. I will see him back for follow-up visit in 3 weeks for evaluation before starting cycle #5. The patient will continue with his current pain medication for now. He was advised to call immediately if he has any other concerning symptoms in the interval. The patient voices understanding of current disease status and treatment options and is in agreement with the current care plan.  All questions were answered. The patient knows to call the clinic with any problems, questions or concerns. We can certainly see the patient much sooner if necessary.  Disclaimer: This note was dictated with voice recognition software. Similar sounding words can inadvertently be transcribed and may not be corrected upon review.

## 2020-12-04 NOTE — Patient Instructions (Signed)
[facility] Discharge Instructions for Patients Receiving Chemotherapy  Today you received the following chemotherapy agents Cyramza/Taxotere   To help prevent nausea and vomiting after your treatment, we encourage you to take your nausea medication as directed.    If you develop nausea and vomiting that is not controlled by your nausea medication, call the clinic.   BELOW ARE SYMPTOMS THAT SHOULD BE REPORTED IMMEDIATELY:  *FEVER GREATER THAN 100.5 F  *CHILLS WITH OR WITHOUT FEVER  NAUSEA AND VOMITING THAT IS NOT CONTROLLED WITH YOUR NAUSEA MEDICATION  *UNUSUAL SHORTNESS OF BREATH  *UNUSUAL BRUISING OR BLEEDING  TENDERNESS IN MOUTH AND THROAT WITH OR WITHOUT PRESENCE OF ULCERS  *URINARY PROBLEMS  *BOWEL PROBLEMS  UNUSUAL RASH Items with * indicate a potential emergency and should be followed up as soon as possible.  Feel free to call the clinic should you have any questions or concerns. The clinic phone number is [phone].  Please show the [place] at check-in to the Emergency Department and triage nurse.  Ramucirumab injection What is this medicine? RAMUCIRUMAB (ra mue SIR ue mab) is a monoclonal antibody. It is used to treat stomach cancer, colorectal cancer, liver cancer, and lung cancer. This medicine may be used for other purposes; ask your health care provider or pharmacist if you have questions. COMMON BRAND NAME(S): Cyramza What should I tell my health care provider before I take this medicine? They need to know if you have any of these conditions:  bleeding disorders  blood clots  heart disease, including heart failure, heart attack, or chest pain (angina)  high blood pressure  infection (especially a virus infection such as chickenpox, cold sores, or herpes)  protein in your urine  recent or planning to have surgery  stroke  an unusual or allergic reaction to ramucirumab, other medicines, foods, dyes, or  preservatives  pregnant or trying to get pregnant  breast-feeding How should I use this medicine? This medicine is for infusion into a vein. It is given by a health care professional in a hospital or clinic setting. Talk to your pediatrician regarding the use of this medicine in children. Special care may be needed. Overdosage: If you think you have taken too much of this medicine contact a poison control center or emergency room at once. NOTE: This medicine is only for you. Do not share this medicine with others. What if I miss a dose? It is important not to miss your dose. Call your doctor or health care professional if you are unable to keep an appointment. What may interact with this medicine? Interactions have not been studied. This list may not describe all possible interactions. Give your health care provider a list of all the medicines, herbs, non-prescription drugs, or dietary supplements you use. Also tell them if you smoke, drink alcohol, or use illegal drugs. Some items may interact with your medicine. What should I watch for while using this medicine? Your condition will be monitored carefully while you are receiving this medicine. You will need to to check your blood pressure and have your blood and urine tested while you are taking this medicine. Your condition will be monitored carefully while you are receiving this medicine. This medicine may increase your risk to bruise or bleed. Call your doctor or health care professional if you notice any unusual bleeding. Before having surgery, talk to your health care provider to make sure it is ok. This drug can increase the risk of poor healing of your  surgical site or wound. You will need to stop this drug for 28 days before surgery. After surgery, wait at least 2 weeks before restarting this drug. Make sure the surgical site or wound is healed enough before restarting this drug. Talk to your health care provider if questions. Do not  become pregnant while taking this medicine or for 3 months after stopping it. Women should inform their doctor if they wish to become pregnant or think they might be pregnant. There is a potential for serious side effects to an unborn child. Talk to your health care professional or pharmacist for more information. Do not breast-feed an infant while taking this medicine or for 2 months after stopping it. This medicine may interfere with the ability to have a child. Talk with your doctor or health care professional if you are concerned about your fertility. What side effects may I notice from receiving this medicine? Side effects that you should report to your doctor or health care professional as soon as possible:  allergic reactions like skin rash, itching or hives, breathing problems, swelling of the face, lips, or tongue  signs of infection - fever or chills, cough, sore throat  chest pain or chest tightness  confusion  dizziness  feeling faint or lightheaded, falls  severe abdominal pain  severe nausea, vomiting  signs and symptoms of bleeding such as bloody or black, tarry stools; red or dark-brown urine; spitting up blood or brown material that looks like coffee grounds; red spots on the skin; unusual bruising or bleeding from the eye, gums, or nose  signs and symptoms of a blood clot such as breathing problems; changes in vision; chest pain; severe, sudden headache; pain, swelling, warmth in the leg; trouble speaking; sudden numbness or weakness of the face, arm or leg  symptoms of a stroke: change in mental awareness, inability to talk or move one side of the body  trouble walking, dizziness, loss of balance or coordination Side effects that usually do not require medical attention (report to your doctor or health care professional if they continue or are bothersome):  cold, clammy skin  constipation  diarrhea  headache  nausea, vomiting  stomach pain  unusually slow  heartbeat  unusually weak or tired This list may not describe all possible side effects. Call your doctor for medical advice about side effects. You may report side effects to FDA at 1-800-FDA-1088. Where should I keep my medicine? This drug is given in a hospital or clinic and will not be stored at home. NOTE: This sheet is a summary. It may not cover all possible information. If you have questions about this medicine, talk to your doctor, pharmacist, or health care provider.  2021 Elsevier/Gold Standard (2019-08-01 11:17:50)  Docetaxel injection What is this medicine? DOCETAXEL (doe se TAX el) is a chemotherapy drug. It targets fast dividing cells, like cancer cells, and causes these cells to die. This medicine is used to treat many types of cancers like breast cancer, certain stomach cancers, head and neck cancer, lung cancer, and prostate cancer. This medicine may be used for other purposes; ask your health care provider or pharmacist if you have questions. COMMON BRAND NAME(S): Docefrez, Taxotere What should I tell my health care provider before I take this medicine? They need to know if you have any of these conditions:  infection (especially a virus infection such as chickenpox, cold sores, or herpes)  liver disease  low blood counts, like low white cell, platelet, or red cell  counts  an unusual or allergic reaction to docetaxel, polysorbate 80, other chemotherapy agents, other medicines, foods, dyes, or preservatives  pregnant or trying to get pregnant  breast-feeding How should I use this medicine? This drug is given as an infusion into a vein. It is administered in a hospital or clinic by a specially trained health care professional. Talk to your pediatrician regarding the use of this medicine in children. Special care may be needed. Overdosage: If you think you have taken too much of this medicine contact a poison control center or emergency room at once. NOTE: This  medicine is only for you. Do not share this medicine with others. What if I miss a dose? It is important not to miss your dose. Call your doctor or health care professional if you are unable to keep an appointment. What may interact with this medicine? Do not take this medicine with any of the following medications:  live virus vaccines This medicine may also interact with the following medications:  aprepitant  certain antibiotics like erythromycin or clarithromycin  certain antivirals for HIV or hepatitis  certain medicines for fungal infections like fluconazole, itraconazole, ketoconazole, posaconazole, or voriconazole  cimetidine  ciprofloxacin  conivaptan  cyclosporine  dronedarone  fluvoxamine  grapefruit juice  imatinib  verapamil This list may not describe all possible interactions. Give your health care provider a list of all the medicines, herbs, non-prescription drugs, or dietary supplements you use. Also tell them if you smoke, drink alcohol, or use illegal drugs. Some items may interact with your medicine. What should I watch for while using this medicine? Your condition will be monitored carefully while you are receiving this medicine. You will need important blood work done while you are taking this medicine. Call your doctor or health care professional for advice if you get a fever, chills or sore throat, or other symptoms of a cold or flu. Do not treat yourself. This drug decreases your body's ability to fight infections. Try to avoid being around people who are sick. Some products may contain alcohol. Ask your health care professional if this medicine contains alcohol. Be sure to tell all health care professionals you are taking this medicine. Certain medicines, like metronidazole and disulfiram, can cause an unpleasant reaction when taken with alcohol. The reaction includes flushing, headache, nausea, vomiting, sweating, and increased thirst. The reaction can  last from 30 minutes to several hours. You may get drowsy or dizzy. Do not drive, use machinery, or do anything that needs mental alertness until you know how this medicine affects you. Do not stand or sit up quickly, especially if you are an older patient. This reduces the risk of dizzy or fainting spells. Alcohol may interfere with the effect of this medicine. Talk to your health care professional about your risk of cancer. You may be more at risk for certain types of cancer if you take this medicine. Do not become pregnant while taking this medicine or for 6 months after stopping it. Women should inform their doctor if they wish to become pregnant or think they might be pregnant. There is a potential for serious side effects to an unborn child. Talk to your health care professional or pharmacist for more information. Do not breast-feed an infant while taking this medicine or for 1 week after stopping it. Males who get this medicine must use a condom during sex with females who can get pregnant. If you get a woman pregnant, the baby could have birth defects. The  baby could die before they are born. You will need to continue wearing a condom for 3 months after stopping the medicine. Tell your health care provider right away if your partner becomes pregnant while you are taking this medicine. This may interfere with the ability to father a child. You should talk to your doctor or health care professional if you are concerned about your fertility. What side effects may I notice from receiving this medicine? Side effects that you should report to your doctor or health care professional as soon as possible:  allergic reactions like skin rash, itching or hives, swelling of the face, lips, or tongue  blurred vision  breathing problems  changes in vision  low blood counts - This drug may decrease the number of white blood cells, red blood cells and platelets. You may be at increased risk for infections  and bleeding.  nausea and vomiting  pain, redness or irritation at site where injected  pain, tingling, numbness in the hands or feet  redness, blistering, peeling, or loosening of the skin, including inside the mouth  signs of decreased platelets or bleeding - bruising, pinpoint red spots on the skin, black, tarry stools, nosebleeds  signs of decreased red blood cells - unusually weak or tired, fainting spells, lightheadedness  signs of infection - fever or chills, cough, sore throat, pain or difficulty passing urine  swelling of the ankle, feet, hands Side effects that usually do not require medical attention (report to your doctor or health care professional if they continue or are bothersome):  constipation  diarrhea  fingernail or toenail changes  hair loss  loss of appetite  mouth sores  muscle pain This list may not describe all possible side effects. Call your doctor for medical advice about side effects. You may report side effects to FDA at 1-800-FDA-1088. Where should I keep my medicine? This drug is given in a hospital or clinic and will not be stored at home. NOTE: This sheet is a summary. It may not cover all possible information. If you have questions about this medicine, talk to your doctor, pharmacist, or health care provider.  2021 Elsevier/Gold Standard (2019-09-03 19:50:31)

## 2020-12-04 NOTE — Telephone Encounter (Signed)
[facility] cannot take e-prescriptions ( digital signature) for the Zarxio 480 mcg rx dated 11/20/20.   I gave him a verbal ok per Cassie and Dr Julien Nordmann.

## 2020-12-05 ENCOUNTER — Telehealth: Payer: Self-pay

## 2020-12-05 ENCOUNTER — Other Ambulatory Visit: Payer: 59

## 2020-12-05 NOTE — Telephone Encounter (Signed)
Call received from [facility] called stating pt declined the rx of Ziextenzo stating it was the incorrect medication and he is supposed to get Zarxio. I spoke with Cassie, PA-C who advised the Ziextenzo is the correct rx. I have called [facility] back and spoke with Lindell Noe and advised of this.  [place] [facility]  Ph: [phone] Fx: [phone]  I have also called the pt and advised of this. He expressed understanding of this information.

## 2020-12-05 NOTE — Telephone Encounter (Signed)
Noted  

## 2020-12-05 NOTE — Telephone Encounter (Signed)
Kesa nurse with [facility] left v/m that pt was supposed to be seen twice a wk. Pt only wants to be seen once a wk. Jenetta Loges does not need a new order but this is just FYI that pt refused 2nd visit this week. Jenetta Loges said she thinks next wk pt's orders are to go to [facility] nursing once a wk so no need to change verbal orders. Sending note to DR G as FYI.

## 2020-12-08 ENCOUNTER — Telehealth: Payer: Self-pay | Admitting: Internal Medicine

## 2020-12-08 ENCOUNTER — Telehealth: Payer: Self-pay

## 2020-12-08 ENCOUNTER — Other Ambulatory Visit: Payer: Self-pay | Admitting: Internal Medicine

## 2020-12-08 MED ORDER — FENTANYL 100 MCG/HR TD PT72: 1.0000 | MEDICATED_PATCH | TRANSDERMAL | 0 refills | Status: DC

## 2020-12-08 NOTE — Telephone Encounter (Signed)
Pt has requested a refill of his Fentanyl patch.

## 2020-12-08 NOTE — Telephone Encounter (Signed)
Scheduled appts per 2/17 los. Pt to get updated appt calendar at next visit per appt notes.

## 2020-12-10 ENCOUNTER — Telehealth: Payer: Self-pay

## 2020-12-10 NOTE — Telephone Encounter (Signed)
Lvm asking pt to call back.  I need to ask about his OV on Fri, 12/12/20.

## 2020-12-11 ENCOUNTER — Inpatient Hospital Stay: Payer: 59

## 2020-12-11 ENCOUNTER — Other Ambulatory Visit: Payer: Self-pay

## 2020-12-11 ENCOUNTER — Telehealth: Payer: Self-pay | Admitting: Medical Oncology

## 2020-12-11 DIAGNOSIS — C3491 Malignant neoplasm of unspecified part of right bronchus or lung: Secondary | ICD-10-CM

## 2020-12-11 DIAGNOSIS — Z95828 Presence of other vascular implants and grafts: Secondary | ICD-10-CM

## 2020-12-11 DIAGNOSIS — Z5112 Encounter for antineoplastic immunotherapy: Secondary | ICD-10-CM | POA: Diagnosis not present

## 2020-12-11 LAB — CMP (CANCER CENTER ONLY)
ALT: 12 U/L (ref 0–44)
AST: 17 U/L (ref 15–41)
Albumin: 2.3 g/dL — ABNORMAL LOW (ref 3.5–5.0)
Alkaline Phosphatase: 100 U/L (ref 38–126)
Anion gap: 7 (ref 5–15)
BUN: 7 mg/dL (ref 6–20)
CO2: 31 mmol/L (ref 22–32)
Calcium: 9.2 mg/dL (ref 8.9–10.3)
Chloride: 97 mmol/L — ABNORMAL LOW (ref 98–111)
Creatinine: 0.62 mg/dL (ref 0.61–1.24)
GFR, Estimated: >60 mL/min (ref >60)
Glucose, Bld: 102 mg/dL — ABNORMAL HIGH (ref 70–99)
Potassium: 4.2 mmol/L (ref 3.5–5.1)
Sodium: 135 mmol/L (ref 135–145)
Total Bilirubin: 0.3 mg/dL (ref 0.3–1.2)
Total Protein: 6.3 g/dL — ABNORMAL LOW (ref 6.5–8.1)

## 2020-12-11 LAB — CBC WITH DIFFERENTIAL (CANCER CENTER ONLY)
Abs Immature Granulocytes: 0.12 10*3/uL — ABNORMAL HIGH (ref 0.00–0.07)
Basophils Absolute: 0.1 10*3/uL (ref 0.0–0.1)
Basophils Relative: 2 %
Eosinophils Absolute: 0 10*3/uL (ref 0.0–0.5)
Eosinophils Relative: 0 %
HCT: 30.2 % — ABNORMAL LOW (ref 39.0–52.0)
Hemoglobin: 9.4 g/dL — ABNORMAL LOW (ref 13.0–17.0)
Immature Granulocytes: 4 %
Lymphocytes Relative: 25 %
Lymphs Abs: 0.7 10*3/uL (ref 0.7–4.0)
MCH: 29 pg (ref 26.0–34.0)
MCHC: 31.1 g/dL (ref 30.0–36.0)
MCV: 93.2 fL (ref 80.0–100.0)
Monocytes Absolute: 0.8 10*3/uL (ref 0.1–1.0)
Monocytes Relative: 26 %
Neutro Abs: 1.2 10*3/uL — ABNORMAL LOW (ref 1.7–7.7)
Neutrophils Relative %: 43 %
Platelet Count: 186 10*3/uL (ref 150–400)
RBC: 3.24 MIL/uL — ABNORMAL LOW (ref 4.22–5.81)
RDW: 20 % — ABNORMAL HIGH (ref 11.5–15.5)
WBC Count: 2.9 10*3/uL — ABNORMAL LOW (ref 4.0–10.5)
nRBC: 1.7 % — ABNORMAL HIGH (ref 0.0–0.2)

## 2020-12-11 MED ORDER — SODIUM CHLORIDE 0.9% FLUSH
10.0000 mL | Freq: Once | INTRAVENOUS | Status: AC
  Administered 2020-12-11: 10 mL
  Filled 2020-12-11: qty 10

## 2020-12-11 MED ORDER — HEPARIN SOD (PORK) LOCK FLUSH 100 UNIT/ML IV SOLN
500.0000 [IU] | Freq: Once | INTRAVENOUS | Status: AC
  Administered 2020-12-11: 500 [IU]
  Filled 2020-12-11: qty 5

## 2020-12-11 MED ORDER — ZIEXTENZO 6 MG/0.6ML ~~LOC~~ SOSY: 6.0000 mg | PREFILLED_SYRINGE | SUBCUTANEOUS | 0 refills | Status: DC

## 2020-12-11 NOTE — Telephone Encounter (Signed)
Spoke with pt relaying Dr. Synthia Innocent message.  Pt verbalizes understanding and scheduled 6 mo f/u on 05/27/21 at 2:30.

## 2020-12-11 NOTE — Telephone Encounter (Signed)
Would suggest 6 mo f/u, sooner if needed.

## 2020-12-11 NOTE — Telephone Encounter (Signed)
**Note De-Identified Onyx Schirmer Obfuscation** Spoke with pt informing him, per Dr. Darnell Level, since he was just seen pt does not need to keep 12/12/20 OV, unless he has something going on.  Pt agrees to c/x appt.  However, he's asks when would he need a f/u.  Plz advise.   OV c/x.

## 2020-12-11 NOTE — Telephone Encounter (Signed)
[facility] pharmacy received rx for Ziextenzo.

## 2020-12-12 ENCOUNTER — Encounter: Payer: 59 | Admitting: Family Medicine

## 2020-12-12 ENCOUNTER — Telehealth: Payer: Self-pay

## 2020-12-12 NOTE — Telephone Encounter (Signed)
Pt left a message requesting a refill of Oxycodone.  I called pt back and left a message acknowledge his message. I also reminded the pt to please request his pain medication Mon-Wed.

## 2020-12-15 ENCOUNTER — Other Ambulatory Visit: Payer: Self-pay | Admitting: Internal Medicine

## 2020-12-15 ENCOUNTER — Telehealth: Payer: Self-pay | Admitting: Medical Oncology

## 2020-12-15 MED ORDER — OXYCODONE HCL 5 MG PO TABS: 5.0000 mg | ORAL_TABLET | Freq: Four times a day (QID) | ORAL | 0 refills | Status: DC | PRN

## 2020-12-15 NOTE — Telephone Encounter (Signed)
He has not received his Ziextenzo injection for home administration due March 11.  Per [facility] ,the medication just got authorized by his insurance. Pt notified and I gave him the direct line for the to call and set up delivery-[phone].

## 2020-12-16 ENCOUNTER — Telehealth: Payer: 59

## 2020-12-16 ENCOUNTER — Ambulatory Visit: Payer: 59

## 2020-12-16 DIAGNOSIS — I5030 Unspecified diastolic (congestive) heart failure: Secondary | ICD-10-CM

## 2020-12-16 DIAGNOSIS — C3491 Malignant neoplasm of unspecified part of right bronchus or lung: Secondary | ICD-10-CM

## 2020-12-17 ENCOUNTER — Telehealth: Payer: Self-pay | Admitting: Medical Oncology

## 2020-12-17 ENCOUNTER — Other Ambulatory Visit: Payer: Self-pay | Admitting: Medical Oncology

## 2020-12-17 DIAGNOSIS — R059 Cough, unspecified: Secondary | ICD-10-CM

## 2020-12-17 DIAGNOSIS — C3491 Malignant neoplasm of unspecified part of right bronchus or lung: Secondary | ICD-10-CM

## 2020-12-17 NOTE — Chronic Care Management (AMB) (Addendum)
Care Management    RN Visit Note  12/17/2020 Name: Mike [name] MRN: [id number] DOB: 1966/07/13  Subjective: Mike [name] is a 55 y.o. year old male who is a primary care patient of [name], MD. The care management team was consulted for assistance with disease management and care coordination needs.    Engaged with patient by telephone for follow up visit in response to provider referral for case management and/or care coordination services.   Consent to Services:   [name] was given information about Care Management services today including:  1. Care Management services includes personalized support from designated clinical staff supervised by his physician, including individualized plan of care and coordination with other care providers 2. 24/7 contact phone numbers for assistance for urgent and routine care needs. 3. The patient may stop case management services at any time by phone call to the office staff.  Patient agreed to services and consent obtained.   Assessment: Review of patient past medical history, allergies, medications, health status, including review of consultants reports, laboratory and other test data, was performed as part of comprehensive evaluation and provision of chronic care management services.   SDOH (Social Determinants of Health) assessments and interventions performed:    Care Plan  No Known Allergies  Outpatient Encounter Medications as of 12/16/2020  Medication Sig Note  . furosemide (LASIX) 20 MG tablet Take 2 tablets (40 mg total) by mouth daily for 7 days, THEN 1 tablet (20 mg total) daily. 12/17/2020: Patient reports he is taking 2 tablets per day  . albuterol (PROVENTIL) (2.5 MG/3ML) 0.083% nebulizer solution Take 3 mLs (2.5 mg total) by nebulization every 6 (six) hours as needed for wheezing or shortness of breath.   [name] atorvastatin (LIPITOR) 20 MG tablet TAKE 1/2 TABLET BY MOUTH DAILY (Patient taking differently: Take 10 mg by  mouth daily.)   . dexamethasone (DECADRON) 4 MG tablet Take 2 tablets TWICE a day the day before, the day of, and the day after chemotherapy.   . diazepam (VALIUM) 5 MG tablet Take 1 tablet (5 mg total) by mouth every 12 (twelve) hours as needed for anxiety or muscle spasms.   [name] docusate sodium (COLACE) 100 MG capsule Take 1 capsule (100 mg total) by mouth 2 (two) times daily. (Patient not taking: No sig reported)   . ELIQUIS 5 MG TABS tablet TAKE 1 TABLET BY MOUTH TWICE A DAY (Patient taking differently: Take 5 mg by mouth 2 (two) times daily.)   . fentaNYL (DURAGESIC) 100 MCG/HR Place 1 patch onto the skin every 3 (three) days.   Karle Barr (ZARXIO) 480 MCG/0.8ML SOSY injection Injection 0.8 mLs (480 mcg) total into the skin once daily for two days (Patient not taking: No sig reported)   . lidocaine-prilocaine (EMLA) cream Apply 1 application topically as needed. Apply 1 TBSP over port site  1-2 hours prior to treatment. Do not rub it in. Cover with plastic wrap.   . loratadine (CLARITIN) 10 MG tablet Take 10 mg by mouth daily.   . magic mouthwash SOLN Take 5 mLs by mouth 4 (four) times daily. 12/02/2020: Patient states he uses as needed.   . metFORMIN (GLUCOPHAGE) 500 MG tablet Take 1 tablet (500 mg total) by mouth daily with breakfast.   . methocarbamol (ROBAXIN) 500 MG tablet Take 1 tablet (500 mg total) by mouth every 6 (six) hours as needed for muscle spasms.   . Multiple Vitamin (MULTIVITAMIN) tablet Take 1 tablet by mouth daily.   [name]  oxyCODONE (OXY IR/ROXICODONE) 5 MG immediate release tablet Take 1 tablet (5 mg total) by mouth every 6 (six) hours as needed for severe pain.   [name] senna-docusate (SENOKOT-S) 8.6-50 MG tablet Take 2 tablets by mouth 2 (two) times daily as needed for mild constipation. 12/02/2020: Patient states he takes 1 tablet 2 times per day  . sildenafil (REVATIO) 20 MG tablet Take 1-5 tablets (20-100 mg total) by mouth daily as needed (ED). (Patient taking differently:  Take 40 mg by mouth daily as needed (ED).) 12/02/2020: Patient states he takes as needed   . ZIEXTENZO 6 MG/0.6ML injection Inject 0.6 mLs (6 mg total) into the skin every 21 ( twenty-one) days.    No facility-administered encounter medications on file as of 12/16/2020.    Patient Active Problem List   Diagnosis Date Noted  . Constipation due to pain medication 11/27/2020  . Protein-calorie malnutrition ([place]) 11/27/2020  . (HFpEF) heart failure with preserved ejection fraction ([place]) 11/27/2020  . DNR (do not resuscitate) 11/27/2020  . Pressure injury of skin 11/08/2020  . Dyspnea and respiratory abnormalities 11/07/2020  . Pleural effusion 11/06/2020  . Cancer related pain 10/08/2020  . Femoral fracture ([place]) 07/24/2020  . Impending pathologic fracture 07/22/2020  . Situational depression 02/22/2020  . UTI due to extended-spectrum beta lactamase (ESBL) producing Escherichia coli 02/10/2020  . Abnormal thyroid function test 02/10/2020  . Port-A-Cath in place 01/29/2020  . Chronic pulmonary embolism ([place]) 01/20/2020  . Stage IV squamous cell carcinoma of right lung ([place]) 12/24/2019  . Advanced directives, counseling/discussion 12/24/2019  . Encounter for antineoplastic chemotherapy 12/24/2019  . Encounter for antineoplastic immunotherapy 12/24/2019  . Rib pain on right side 12/12/2019  . Bony metastasis ([place]) 12/12/2019  . Fatty liver 12/05/2019  . Encounter for well adult exam with abnormal findings 11/25/2019  . GERD (gastroesophageal reflux disease) 11/25/2019  . Dyslipidemia associated with type 2 diabetes mellitus ([place]) 11/25/2019  . Ex-smoker 09/15/2019  . OSA (obstructive sleep apnea) 09/15/2019  . Obesity, Class I, BMI 30-34.9 09/04/2019  . Erectile dysfunction 10/18/2014  . Type 2 diabetes mellitus with other specified complication ([place]) 16/04/3709    Conditions to be addressed/monitored: CHF and Lung cancer  Care Plan : Cancer Treatment Phase (Adult)  Updates made by  Dannielle Karvonen, RN since 12/17/2020 12:00 AM  Problem: Fatigue   Priority: High  Long-Range Goal: Patient will verbalize decrease symptoms of fatigue   Start Date: 12/02/2020  Expected End Date: 03/02/2021  Recent Progress: On track  Priority: High  Current Barriers:  . Chronic Disease Management support, education, and care coordination needs related to  Lung Cancer   Clinical Goal(s) related to  Lung Cancer :  Patient will:  . Work with the care management team to address educational, disease management, and care coordination needs  . Begin or continue self health monitoring activities as directed today:  Keep your follow up appointments with your oncologist,  monitor oxygen saturation, notify your doctor for new or increase in symptoms: Oxygen saturation lower than established parameter, Chest pain, and Shortness of breath . Call care management team with questions or concerns. Interventions related to  Lung cancer :  . Discussed current treatment plans and patient's adherence to plan as established by provider . Assessed patient understanding of disease state.  Patient reports he is not feeling as fatigue with his cancer treatments.  . Assessed patient's education and care coordination needs . Encourage physical activity, as appropriate (walking is a good way to ease  fatigue) Patient reports an increase in exercise. He reports he is feeling less fatigued and having more energy which allows him to walk everyday.   . Advised patient to take frequent rest breaks. Patient Goals/ Self Care Activities related to  Lung cancer :  . - do not eat or exercise right before bedtime . - eat healthy . - get outdoors every day (weather permitting) Walking is great at helping decrease fatigue. Patient report he is getting outdoors more since the weather has changed and is walking daily. Great job! . - limit daytime naps . - maintain healthy weight . - take a warm shower or bath before bed . - use  meditation or relaxation techniques     Problem: Pain   Priority: High  Onset Date: 12/02/2020  Long-Range Goal: Patient will report satisfactory pain control at a level less than 3 to 4  on a scale of ) to 10   Start Date: 12/02/2020  Expected End Date: 01/31/2021  Recent Progress: On track  Priority: High  Current Barriers:  [name] Knowledge Deficits related to self-health management of acute or chronic pain: . Chronic Disease Management support and education needs related to chronic pain Clinical Goal(s):  . patient will verbalize understanding of plan for pain management . patient will telephonically meet with RN Care Manager to address pain concerns  . patient will attend all scheduled medical appointments  . patient will use pharmacological and nonpharmacological pain relief strategies as prescribed.  . patient will verbalize acceptable level of pain relief and ability to engage in desired activities Interventions:  . Collaboration with [name], MD regarding development and update of comprehensive plan of care as evidenced by provider attestation and co-signature . Pain assessment performed:  Patient reports pain level is a 5 on a scale of 1-10. Reports fentanyl patch is managing pain at this time.  . Discussed plans with patient for ongoing care management follow up and provided patient with direct contact information for care management team . Provided education to patient regarding pain management strategies. . Reviewed medications with patient and encouraged patient to take pain medications as prescribed: Patient reports he continues to wear his fentanyl patch and uses his oxycodone as needed for breakthrough pain management . Reviewed scheduled/upcoming provider appointments. . Discussed plans with patient for ongoing care management follow up and provided patient with direct contact information for care management team Patient Goals/Self Care Activities:  . Will attend all  scheduled provider appointments . Will call pharmacy for medication refills 7 days prior to needed refill date . Will call provider office for new concerns or questions . - develop a personal pain management plan . - do something like read, listen to music or watch television to keep mind off pain . - learn relaxation techniques . - Continue to exercise or plan activity when pain is best controlled . - prioritize tasks for the day . - track times pain is worst and when it is best . - track what makes the pain worse and what makes it better . - use medicine only as doctor says Follow Up Plan: The patient has been provided with contact information for the care management team and has been advised to call with any health related questions or concerns.  The care management team will reach out to the patient again over the next 30 days.     Care Plan : Heart Failure (Adult)  Updates made by Dannielle Karvonen, RN since 12/17/2020 12:00  AM  Problem: Symptom Exacerbation (Heart Failure)   Priority: High  Onset Date: 12/02/2020  Long-Range Goal: Symptom Exacerbation Prevented or Minimized   Start Date: 12/02/2020  Expected End Date: 03/02/2021  Recent Progress: On track  Priority: High  Current Barriers:  [name] Knowledge deficit related to basic heart failure pathophysiology and self care management:  Patient reports he has some swelling in his feet/ ankles Case Manager Clinical Goal(s):  . patient will verbalize understanding of Heart Failure Action Plan and when to call doctor . patient will take all Heart Failure medications as prescribed . patient will weigh daily and record (notifying MD of 3 lb weight gain over night or 5 lb in a week) Interventions:  . Collaboration with [name], MD regarding development and update of comprehensive plan of care as evidenced by provider attestation and co-signature . Inter-disciplinary care team collaboration (see longitudinal plan of care) . Provided  verbal and written education on low sodium diet . Reviewed Heart Failure Action Plan in depth . Discussed importance of daily weight and advised patient to weigh and record daily . Reviewed role of diuretics in prevention of fluid overload and management of heart failure: Patient reports he is taking 2 tablets per day of his lasix due to some feet/ ankle swelling.  He denies a weight gain of 3 lbs overnight.  Patient Goals/Self-Care Activities - eat more whole grains, fruits and vegetables, lean meats and healthy fats - follow rescue plan if symptoms flare-up - know when to call the doctor - track symptoms and what helps feel better or worse: Elevate your feet when sitting to help reduce swelling.  - dress right for the weather, hot or cold  - Review Heart Failure action plan/ zones sent to you in My Chart - Follow up with your providers as recommended - Take your medications as prescribed.  - Monitor for signs/ symptoms of heart failure:  Weight gain of 3 pounds overnight or 5 lbs in a week, swelling in feet, ankles, stomach or hands, chest discomfort or heaviness, more tired/ less energy, dry hacky cough, new or worsening dizziness, uneasy feeling that something is not right. Notify your doctor of symptoms.   Follow Up Plan: The patient has been provided with contact information for the care management team and has been advised to call with any health related questions or concerns.  The care management team will reach out to the patient again over the next 30 days.      Plan: The patient has been provided with contact information for the care management team and has been advised to call with any health related questions or concerns.  and The care management team will reach out to the patient again over the next 30 days.  Quinn Plowman RN,BSN,CCM RN Case Manager [place]  [phone]

## 2020-12-17 NOTE — Telephone Encounter (Addendum)
cough that is getting worse- Home Health visit today and pt reports worsening cough. Nurse also reports LE edema. Pt to keep lab appt tomorrow .  Message sent for pt to see Lucianne Lei in [facility].   CXR LVM for pt to go to radiology tomorrow for CXR before appts at [facility].

## 2020-12-17 NOTE — Patient Instructions (Signed)
Visit Information  Goals Addressed            This Visit's Progress   . Keep Pain Under Control   On track    Timeframe:  Long-Range Goal Priority:  High Start Date:      12/02/20                      Expected End Date:    02/13/21             . attend all scheduled appointments as advised by your providers . Call your pharmacy for medication refills 7 days prior to needed refill date:  If you have difficulty obtaining your medications contact your assigned RN case manager to discuss.  . Call your provider office for new concerns or questions . - develop a personal pain management plan (Follow plan as advised by your doctor) . - do something like read, listen to music or watch television to keep mind off pain . - learn relaxation techniques . Doristine Devoid Job with your exercising. Continue to exercise as tolerated.  . - prioritize tasks for the day . - track times pain is worst and when it is best . - track what makes the pain worse and what makes it better . - use medicine only as doctor says:   Why is this important?   Day-to-day life can be hard when you have pain.  Even a small change in emotion or a physical problem can make pain better or worse.  Coping with pain depends on how the mind and body reacts to pain.  Pain medicine is just one piece of the treatment puzzle.  There are many tools to help manage pain. A combination of them can be used to best meet your needs.         . Manage fatigue symptoms   On track    Timeframe:  Long-Range Goal Priority:   High Start Date:       12/02/20                      Expected End Date:    4/ 29/22                  - do not eat or exercise right before bedtime - eat healthy - get outdoors every day (weather permitting) - limit daytime naps - maintain healthy weight - take a warm shower or bath before bed - use meditation or relaxation techniques    Why is this important?   Cancer treatment and its side effects can drain your  energy. It can keep you from doing things you would like to do.  There are many things that you can do to manage fatigue.        . Manage my heart failure   On track    Timeframe:  Long-Range Goal Priority:  High Start Date:    12/02/20                         Expected End Date:  03/02/21                     Follow Up Date 12/28/2020    - eat more whole grains, fruits and vegetables, lean meats and healthy fats - follow rescue plan if symptoms flare-up - know when to call the doctor - track symptoms and what helps feel better  or worse: Elevate your feet when sitting to help reduce swelling.  - dress right for the weather, hot or cold  - Review Heart Failure action plan/ zones sent to you in My Chart - Follow up with your providers as recommended - Take your medications as prescribed.  - Monitor for signs/ symptoms of heart failure:  Weight gain of 3 pounds overnight or 5 lbs in a week, swelling in feet, ankles, stomach or hands, chest discomfort or heaviness, more tired/ less energy, dry hacky cough, new or worsening dizziness, uneasy feeling that something is not right. Notify your doctor of symptoms.    Why is this important?    You will be able to handle your symptoms better if you keep track of them.   Making some simple changes to your lifestyle will help.   Eating healthy is one thing you can do to take good care of yourself.         Patient verbalizes understanding of instructions provided today and agrees to view in [place].   The patient has been provided with contact information for the care management team and has been advised to call with any health related questions or concerns.  The care management team will reach out to the patient again over the next 30 days.   Quinn Plowman RN,BSN,CCM RN Case Manager [place]  [phone]

## 2020-12-18 ENCOUNTER — Inpatient Hospital Stay: Payer: 59

## 2020-12-18 ENCOUNTER — Inpatient Hospital Stay (HOSPITAL_BASED_OUTPATIENT_CLINIC_OR_DEPARTMENT_OTHER): Payer: 59 | Admitting: Medical

## 2020-12-18 ENCOUNTER — Inpatient Hospital Stay: Payer: 59 | Attending: Physician Assistant

## 2020-12-18 ENCOUNTER — Ambulatory Visit (HOSPITAL_COMMUNITY)
Admission: RE | Admit: 2020-12-18 | Discharge: 2020-12-18 | Disposition: A | Discharge status: Home / Self Care | Payer: 59 | Source: Ambulatory Visit | Attending: Medical | Admitting: Medical

## 2020-12-18 ENCOUNTER — Other Ambulatory Visit: Payer: Self-pay

## 2020-12-18 VITALS — BP 104/73 | HR 102 | Temp 97.8°F | Resp 18 | Wt 235.4 lb

## 2020-12-18 DIAGNOSIS — E119 Type 2 diabetes mellitus without complications: Secondary | ICD-10-CM | POA: Diagnosis not present

## 2020-12-18 DIAGNOSIS — Z95828 Presence of other vascular implants and grafts: Secondary | ICD-10-CM

## 2020-12-18 DIAGNOSIS — Z7984 Long term (current) use of oral hypoglycemic drugs: Secondary | ICD-10-CM | POA: Insufficient documentation

## 2020-12-18 DIAGNOSIS — Z7901 Long term (current) use of anticoagulants: Secondary | ICD-10-CM | POA: Diagnosis not present

## 2020-12-18 DIAGNOSIS — Z5111 Encounter for antineoplastic chemotherapy: Secondary | ICD-10-CM | POA: Insufficient documentation

## 2020-12-18 DIAGNOSIS — C3491 Malignant neoplasm of unspecified part of right bronchus or lung: Secondary | ICD-10-CM

## 2020-12-18 DIAGNOSIS — C3411 Malignant neoplasm of upper lobe, right bronchus or lung: Secondary | ICD-10-CM | POA: Insufficient documentation

## 2020-12-18 DIAGNOSIS — C7951 Secondary malignant neoplasm of bone: Secondary | ICD-10-CM

## 2020-12-18 DIAGNOSIS — J90 Pleural effusion, not elsewhere classified: Secondary | ICD-10-CM | POA: Diagnosis not present

## 2020-12-18 DIAGNOSIS — Z5112 Encounter for antineoplastic immunotherapy: Secondary | ICD-10-CM | POA: Diagnosis present

## 2020-12-18 DIAGNOSIS — R059 Cough, unspecified: Secondary | ICD-10-CM

## 2020-12-18 DIAGNOSIS — R600 Localized edema: Secondary | ICD-10-CM

## 2020-12-18 DIAGNOSIS — F17210 Nicotine dependence, cigarettes, uncomplicated: Secondary | ICD-10-CM | POA: Insufficient documentation

## 2020-12-18 DIAGNOSIS — Z79899 Other long term (current) drug therapy: Secondary | ICD-10-CM | POA: Insufficient documentation

## 2020-12-18 LAB — CBC WITH DIFFERENTIAL (CANCER CENTER ONLY)
Abs Immature Granulocytes: 0.13 10*3/uL — ABNORMAL HIGH (ref 0.00–0.07)
Basophils Absolute: 0.1 10*3/uL (ref 0.0–0.1)
Basophils Relative: 0 %
Eosinophils Absolute: 0 10*3/uL (ref 0.0–0.5)
Eosinophils Relative: 0 %
HCT: 32.7 % — ABNORMAL LOW (ref 39.0–52.0)
Hemoglobin: 10 g/dL — ABNORMAL LOW (ref 13.0–17.0)
Immature Granulocytes: 1 %
Lymphocytes Relative: 7 %
Lymphs Abs: 1 10*3/uL (ref 0.7–4.0)
MCH: 29.2 pg (ref 26.0–34.0)
MCHC: 30.6 g/dL (ref 30.0–36.0)
MCV: 95.3 fL (ref 80.0–100.0)
Monocytes Absolute: 0.8 10*3/uL (ref 0.1–1.0)
Monocytes Relative: 6 %
Neutro Abs: 12 10*3/uL — ABNORMAL HIGH (ref 1.7–7.7)
Neutrophils Relative %: 86 %
Platelet Count: 190 10*3/uL (ref 150–400)
RBC: 3.43 MIL/uL — ABNORMAL LOW (ref 4.22–5.81)
RDW: 20.9 % — ABNORMAL HIGH (ref 11.5–15.5)
WBC Count: 14 10*3/uL — ABNORMAL HIGH (ref 4.0–10.5)
nRBC: 0 % (ref 0.0–0.2)

## 2020-12-18 LAB — CMP (CANCER CENTER ONLY)
ALT: 7 U/L (ref 0–44)
AST: 16 U/L (ref 15–41)
Albumin: 2.2 g/dL — ABNORMAL LOW (ref 3.5–5.0)
Alkaline Phosphatase: 117 U/L (ref 38–126)
Anion gap: 9 (ref 5–15)
BUN: 6 mg/dL (ref 6–20)
CO2: 29 mmol/L (ref 22–32)
Calcium: 8.9 mg/dL (ref 8.9–10.3)
Chloride: 100 mmol/L (ref 98–111)
Creatinine: 0.63 mg/dL (ref 0.61–1.24)
GFR, Estimated: >60 mL/min (ref >60)
Glucose, Bld: 136 mg/dL — ABNORMAL HIGH (ref 70–99)
Potassium: 3.9 mmol/L (ref 3.5–5.1)
Sodium: 138 mmol/L (ref 135–145)
Total Bilirubin: 0.2 mg/dL — ABNORMAL LOW (ref 0.3–1.2)
Total Protein: 6.1 g/dL — ABNORMAL LOW (ref 6.5–8.1)

## 2020-12-18 MED ORDER — HEPARIN SOD (PORK) LOCK FLUSH 100 UNIT/ML IV SOLN
500.0000 [IU] | Freq: Once | INTRAVENOUS | Status: AC
Start: 2020-12-18 — End: 2020-12-18
  Administered 2020-12-18: 500 [IU]
  Filled 2020-12-18: qty 5

## 2020-12-18 MED ORDER — SODIUM CHLORIDE 0.9% FLUSH
10.0000 mL | Freq: Once | INTRAVENOUS | Status: AC
  Administered 2020-12-18: 10 mL
  Filled 2020-12-18: qty 10

## 2020-12-18 NOTE — Progress Notes (Signed)
[facility] Progress Note   [name] [id number] [birth year] 55 y.o.  Mike [name] is managed by Dr. Fanny Bien. Mike [name]  Actively treated with chemotherapy/immunotherapy/hormonal therapy: yes  Current therapy: Palliative systemic chemotherapy with docetaxel 75 mg/m2 and Cyramza 10 mg/kg IV every 3 weeks with Neulasta support.   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 Julien [name] and is currently treated with docetaxel and Cyramza with Neulasta support.  He is status post cycle #4, day #1 which was dosed on 12/04/2020.  He is scheduled to see Dr. Julien [name] next on 12/25/2020.  Cough with increasing bilateral lower extremity edema: Mike [name] was referred for chest x-ray today which returned showing:  FINDINGS: PowerPort catheter in stable position. Heart size stable. Persistent density right upper lung. Bilateral interstitial prominence noted on today's exam. Interstitial edema and or pneumonitis could present in this fashion. Other etiologies of interstitial disease interstitial tumor spread cannot be excluded. Small bilateral pleural effusions. No acute bony abnormality. Degenerative change in scoliosis thoracic spine.  IMPRESSION: 1.  PowerPort catheter stable position.  2.  Persistent density right upper lung.  3. Diffuse bilateral interstitial prominence noted on today's exam. Interstitial edema and or pneumonitis could present in this fashion. Other etiologies of interstitial disease including interstitial tumor spread cannot be excluded. Small bilateral pleural effusions.  He reports that his cough is  mainly at night and improves with his use of an albuterol inhaler.  No further intervention is indicated at this time.    Bilateral lower extremity edema: Mike [name] reports that his bilateral lower extremity edema improves with his use of Lasix.  No further intervention is indicated at this time.   Please see After Visit Summary for patient specific instructions.  Future Appointments  Date Time Provider [place]  12/18/2020  9:30 AM [facility] None  12/18/2020  9:45 AM [facility] [place] FLUSH [facility] None  12/18/2020 10:00 AM Mike [name], Lucianne Lei E., PA-C [facility] None  12/25/2020  8:15 AM [facility] LAB [facility] None  12/25/2020  8:30 AM [facility] [place] FLUSH [facility] None  12/25/2020  9:00 AM [name], MD [facility] None  12/25/2020  9:45 AM [facility] INFUSION [facility] None  12/29/2020 11:30 AM [facility] C[facility] PEC  01/01/2021  9:30 AM [facility] LAB [facility] None  01/01/2021  9:45 AM [facility] [place] FLUSH [facility] None  01/08/2021  9:30 AM [facility] LAB [facility] None  01/08/2021  9:45 AM [facility] [place] FLUSH [facility] None  01/15/2021  9:15 AM [facility] LAB [facility] None  01/15/2021  9:30 AM [facility] [place] FLUSH [facility] None  01/15/2021 10:00 AM [name], [name], PA-C [facility] None  01/15/2021 11:00 AM [facility] INFUSION [facility] None  01/20/2021  1:00 PM [name], NP [facility] None  01/22/2021  9:30 AM [facility] LAB [facility] None  01/22/2021  9:45 AM [facility] FLUSH [facility] None  01/29/2021  9:30 AM [facility] LAB [facility] None  01/29/2021  9:45 AM [facility] FLUSH [facility] None  02/05/2021  9:15 AM [facility] LAB [facility] None  02/05/2021  9:30 AM [facility] [place] FLUSH [facility] None  02/05/2021 10:00 AM [name], [name], PA-C [facility] None  02/05/2021 11:00  AM [facility] INFUSION [facility] None  02/12/2021  9:30 AM [facility] LAB [facility] None  02/12/2021  9:45 AM [facility]  [place] FLUSH [facility] None  05/27/2021  2:30 PM [name], MD [facility] PEC    No orders of the defined types were placed in this encounter.      Subjective:   Patient ID:  Mike [name] is a 55 y.o. (DOB 09-06-1966) male.  Chief Complaint: No chief complaint on file.   HPI Mike [name]  is a 55 y.o. male with a diagnosis of a stage IV squamous cell carcinoma of the right lung with bony metastatic disease.  He is followed by Dr. Julien [name] and is currently treated with docetaxel and Cyramza with Neulasta support.  He is status post cycle #4, day #1 which was dosed on 12/04/2020.  He is scheduled to see Dr. Julien [name] next on 12/25/2020.  Mike [name] contact our office for today stating that he was having a worsening cough bilateral lower extremity edema.  In addition to his history of a metastatic squamous cell carcinoma he has a history of congestive heart failure.  He reports that his cough mainly occurs in the evening.  He is using an albuterol inhaler with good results.  He reports that his cough improves after using his inhaler.  He has ongoing bilateral lower extremity edema which he reports improves when he uses Lasix.  He states that he was instructed to be seen today by home health.  He was referred for chest x-ray today which returned showing:  FINDINGS: PowerPort catheter in stable position. Heart size stable. Persistent density right upper lung. Bilateral interstitial prominence noted on today's exam. Interstitial edema and or pneumonitis could present in this fashion. Other etiologies of interstitial disease interstitial tumor spread cannot be excluded. Small bilateral pleural effusions. No acute bony abnormality. Degenerative change in scoliosis thoracic spine.  IMPRESSION: 1.  PowerPort catheter stable position.  2.  Persistent density right upper lung.  3. Diffuse bilateral interstitial prominence noted on today's exam. Interstitial edema and or pneumonitis  could present in this fashion. Other etiologies of interstitial disease including interstitial tumor spread cannot be excluded. Small bilateral pleural effusions.   Medications: I have reviewed the patient's current medications.  Allergies: No Known Allergies  Past Medical History:  Diagnosis Date  . Anemia   . Arthritis    neck  . Cancer, metastatic to bone ([place]) dx'd 10/2019  . Clotting disorder ([place])    previous blood clot  . Diabetes ([place])   . Fatty liver 12/05/2019   By [place] 11/2019  . GERD (gastroesophageal reflux disease)   . 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 High cholesterol   . IBS (irritable bowel syndrome)   . lung ca dx'd 10/2019   lung stage 4   . OSA (obstructive sleep apnea) 09/15/2019   Sleep study 12/2017 - severe OSA with AHI 65.6, desat to 76% rec CPAP  . Severe obesity (BMI 35.0-39.9) with comorbidity ([place]) 09/04/2019    Past Surgical History:  Procedure Laterality Date  . BIOPSY  12/18/2019   Procedure: BIOPSY;  Surgeon: Garner Nash, DO;  Location: [facility];  Service: Pulmonary;;  . BRONCHIAL BRUSHINGS  12/18/2019   Procedure: BRONCHIAL BRUSHINGS;  Surgeon: Garner Nash, DO;  Location: [facility];  Service: Pulmonary;;  . BRONCHIAL WASHINGS  12/18/2019   Procedure: BRONCHIAL WASHINGS;  Surgeon: Garner Nash, DO;  Location: [facility];  Service: Pulmonary;;  .  COLONOSCOPY  10/2019   mult TA, int hem, rpt 1 yr ([place])  . CYSTECTOMY     knee- left knee cyst  . ENDOBRONCHIAL ULTRASOUND  12/18/2019   Procedure: ENDOBRONCHIAL ULTRASOUND;  Surgeon: Garner Nash, DO;  Location: [facility];  Service: Pulmonary;;  . FINE NEEDLE ASPIRATION  12/18/2019   Procedure: FINE NEEDLE ASPIRATION;  Surgeon: Garner Nash, DO;  Location: [facility];  Service: Pulmonary;;  . FRACTURE SURGERY     left femur fracture  . INTRAMEDULLARY (IM) NAIL INTERTROCHANTERIC Left 07/25/2020   Procedure: LEFT INTERTROCHANTERIC INTRAMEDULLARY (IM)  NAIL;  Surgeon: Leandrew Koyanagi, MD;  Location: [facility];  Service: Orthopedics;  Laterality: Left;  . IR IMAGING GUIDED PORT INSERTION  12/27/2019  . PILONIDAL CYST EXCISION    . VIDEO BRONCHOSCOPY WITH ENDOBRONCHIAL ULTRASOUND Right 12/18/2019   Procedure: VIDEO BRONCHOSCOPY;  Surgeon: Garner Nash, DO;  Location: [facility];  Service: Pulmonary;  Laterality: Right;    Family History  Problem Relation Age of Onset  . CAD Mother        stents  . Stroke Neg Hx   . Diabetes Neg Hx   . Cancer Neg Hx   . Colon cancer Neg Hx   . Esophageal cancer Neg Hx   . Stomach cancer Neg Hx   . Rectal cancer Neg Hx     Social History   Socioeconomic History  . Marital status: Divorced    Spouse name: Not on file  . Number of children: Not on file  . Years of education: 86  . Highest education level: 12th grade  Occupational History  . Occupation: Applying for Disability  Tobacco Use  . Smoking status: Current Every Day Smoker    Packs/day: 1.00    Years: 40.00    Pack years: 40.00    Last attempt to quit: 01/18/2020    Years since quitting: 0.9  . Smokeless tobacco: Never Used  . Tobacco comment: Currently smoking about 5 cigs a day  Vaping Use  . Vaping Use: Never used  Substance and Sexual Activity  . Alcohol use: Yes    Comment: Rarely  . Drug use: Never  . Sexual activity: Yes    Partners: Female  Other Topics Concern  . Not on file  Social History Narrative   Lives with brother Claiborne Billings) and fiancee Mamie Nick)   98 yo son died remotely   36 yo son died March 29, 2020 of presumed MI (morbidly obese)   Occ: [employer]    Edu: HS, self taught [employer]   Activity:    Diet:    Social Determinants of Health   Financial Resource Strain: Low Risk   . Difficulty of Paying Living Expenses: Not hard at all  Food Insecurity: No Food Insecurity  . Worried About Charity fundraiser in the Last Year: Never true  . Ran Out of Food in the Last Year: Never true   Transportation Needs: No Transportation Needs  . Lack of Transportation (Medical): No  . Lack of Transportation (Non-Medical): No  Physical Activity: Inactive  . Days of Exercise per Week: 0 days  . Minutes of Exercise per Session: 0 min  Stress: No Stress Concern Present  . Feeling of Stress : Only a little  Social Connections: Moderately Isolated  . Frequency of Communication with Friends and Family: More than three times a week  . Frequency of Social Gatherings with Friends and Family: More than three times a week  .  Attends Religious Services: More than 4 times per year  . Active Member of Clubs or Organizations: No  . Attends Archivist Meetings: Never  . Marital Status: Divorced  Human resources officer Violence: Not At Risk  . Fear of Current or Ex-Partner: No  . Emotionally Abused: No  . Physically Abused: No  . Sexually Abused: No    Past Medical History, Surgical history, Social history, and Family history were reviewed and updated as appropriate.   Please see review of systems for further details on the patient's review from today.   Review of Systems:  Review of Systems  Constitutional: Negative for chills, diaphoresis, fatigue and fever.  HENT: Negative for congestion, postnasal drip, rhinorrhea and sore throat.   Respiratory: Positive for cough. Negative for shortness of breath and wheezing.   Cardiovascular: Positive for leg swelling. Negative for chest pain and palpitations.  Neurological: Negative for headaches.    Objective:   Physical Exam:  There were no vitals taken for this visit. ECOG: 1  Physical Exam Constitutional:      General: He is not in acute distress.    Appearance: He is not diaphoretic.  HENT:     Head: Normocephalic and atraumatic.  Eyes:     General: No scleral icterus.       Right eye: No discharge.        Left eye: No discharge.     Conjunctiva/sclera: Conjunctivae normal.  Cardiovascular:     Rate and Rhythm: Normal rate  and regular rhythm.     Heart sounds: Normal heart sounds. No murmur heard. No friction rub. No gallop.   Pulmonary:     Effort: Pulmonary effort is normal. No respiratory distress.     Breath sounds: Rhonchi present. No wheezing or rales.  Musculoskeletal:     Right lower leg: Edema present.     Left lower leg: Edema present.  Skin:    General: Skin is warm and dry.     Findings: No erythema or rash.  Neurological:     Mental Status: He is alert.     Gait: Gait abnormal (The patient is ambulating with the use of a wheelchair.).     Lab Review:     Component Value Date/Time   NA 135 12/11/2020 1009   K 4.2 12/11/2020 1009   CL 97 (L) 12/11/2020 1009   CO2 31 12/11/2020 1009   GLUCOSE 102 (H) 12/11/2020 1009   BUN 7 12/11/2020 1009   CREATININE 0.62 12/11/2020 1009   CALCIUM 9.2 12/11/2020 1009   PROT 6.3 (L) 12/11/2020 1009   ALBUMIN 2.3 (L) 12/11/2020 1009   AST 17 12/11/2020 1009   ALT 12 12/11/2020 1009   ALKPHOS 100 12/11/2020 1009   BILITOT 0.3 12/11/2020 1009   GFRNONAA >60 12/11/2020 1009   GFRAA >60 07/08/2020 0808       Component Value Date/Time   WBC 2.9 (L) 12/11/2020 1009   WBC 11.0 (H) 11/08/2020 1029   RBC 3.24 (L) 12/11/2020 1009   HGB 9.4 (L) 12/11/2020 1009   HCT 30.2 (L) 12/11/2020 1009   PLT 186 12/11/2020 1009   MCV 93.2 12/11/2020 1009   MCH 29.0 12/11/2020 1009   MCHC 31.1 12/11/2020 1009   RDW 20.0 (H) 12/11/2020 1009   LYMPHSABS 0.7 12/11/2020 1009   MONOABS 0.8 12/11/2020 1009   EOSABS 0.0 12/11/2020 1009   BASOSABS 0.1 12/11/2020 1009   -------------------------------  Imaging from last 24 hours (if applicable):  Radiology interpretation:  No results found.      This case was discussed with Dr. Julien [name]. He expressed agreement with my management of this patient.

## 2020-12-23 ENCOUNTER — Other Ambulatory Visit: Payer: Self-pay | Admitting: Internal Medicine

## 2020-12-25 ENCOUNTER — Inpatient Hospital Stay: Payer: 59

## 2020-12-25 ENCOUNTER — Other Ambulatory Visit: Payer: Self-pay

## 2020-12-25 ENCOUNTER — Encounter: Payer: Self-pay | Admitting: Internal Medicine

## 2020-12-25 ENCOUNTER — Inpatient Hospital Stay (HOSPITAL_BASED_OUTPATIENT_CLINIC_OR_DEPARTMENT_OTHER): Payer: 59 | Admitting: Internal Medicine

## 2020-12-25 VITALS — HR 99

## 2020-12-25 VITALS — BP 109/80 | HR 103 | Temp 96.3°F | Resp 20 | Ht 72.0 in | Wt 232.0 lb

## 2020-12-25 DIAGNOSIS — C3491 Malignant neoplasm of unspecified part of right bronchus or lung: Secondary | ICD-10-CM

## 2020-12-25 DIAGNOSIS — C7951 Secondary malignant neoplasm of bone: Secondary | ICD-10-CM

## 2020-12-25 DIAGNOSIS — Z5111 Encounter for antineoplastic chemotherapy: Secondary | ICD-10-CM

## 2020-12-25 DIAGNOSIS — C3490 Malignant neoplasm of unspecified part of unspecified bronchus or lung: Secondary | ICD-10-CM | POA: Diagnosis not present

## 2020-12-25 DIAGNOSIS — Z95828 Presence of other vascular implants and grafts: Secondary | ICD-10-CM

## 2020-12-25 DIAGNOSIS — Z5112 Encounter for antineoplastic immunotherapy: Secondary | ICD-10-CM | POA: Diagnosis not present

## 2020-12-25 LAB — CMP (CANCER CENTER ONLY)
ALT: 7 U/L (ref 0–44)
AST: 13 U/L — ABNORMAL LOW (ref 15–41)
Albumin: 2.2 g/dL — ABNORMAL LOW (ref 3.5–5.0)
Alkaline Phosphatase: 96 U/L (ref 38–126)
Anion gap: 10 (ref 5–15)
BUN: 7 mg/dL (ref 6–20)
CO2: 28 mmol/L (ref 22–32)
Calcium: 9.5 mg/dL (ref 8.9–10.3)
Chloride: 100 mmol/L (ref 98–111)
Creatinine: 0.65 mg/dL (ref 0.61–1.24)
GFR, Estimated: >60 mL/min (ref >60)
Glucose, Bld: 225 mg/dL — ABNORMAL HIGH (ref 70–99)
Potassium: 4.4 mmol/L (ref 3.5–5.1)
Sodium: 138 mmol/L (ref 135–145)
Total Bilirubin: 0.3 mg/dL (ref 0.3–1.2)
Total Protein: 6.7 g/dL (ref 6.5–8.1)

## 2020-12-25 LAB — CBC WITH DIFFERENTIAL (CANCER CENTER ONLY)
Abs Immature Granulocytes: 0.08 10*3/uL — ABNORMAL HIGH (ref 0.00–0.07)
Basophils Absolute: 0 10*3/uL (ref 0.0–0.1)
Basophils Relative: 0 %
Eosinophils Absolute: 0 10*3/uL (ref 0.0–0.5)
Eosinophils Relative: 0 %
HCT: 32.4 % — ABNORMAL LOW (ref 39.0–52.0)
Hemoglobin: 10.1 g/dL — ABNORMAL LOW (ref 13.0–17.0)
Immature Granulocytes: 1 %
Lymphocytes Relative: 5 %
Lymphs Abs: 0.7 10*3/uL (ref 0.7–4.0)
MCH: 29.4 pg (ref 26.0–34.0)
MCHC: 31.2 g/dL (ref 30.0–36.0)
MCV: 94.2 fL (ref 80.0–100.0)
Monocytes Absolute: 1 10*3/uL (ref 0.1–1.0)
Monocytes Relative: 6 %
Neutro Abs: 13.2 10*3/uL — ABNORMAL HIGH (ref 1.7–7.7)
Neutrophils Relative %: 88 %
Platelet Count: 272 10*3/uL (ref 150–400)
RBC: 3.44 MIL/uL — ABNORMAL LOW (ref 4.22–5.81)
RDW: 20.5 % — ABNORMAL HIGH (ref 11.5–15.5)
WBC Count: 15 10*3/uL — ABNORMAL HIGH (ref 4.0–10.5)
nRBC: 0 % (ref 0.0–0.2)

## 2020-12-25 LAB — TOTAL PROTEIN, URINE DIPSTICK: Protein, ur: NEGATIVE mg/dL

## 2020-12-25 MED ORDER — SODIUM CHLORIDE 0.9% FLUSH
10.0000 mL | INTRAVENOUS | Status: DC | PRN
  Filled 2020-12-25: qty 10

## 2020-12-25 MED ORDER — DIPHENHYDRAMINE HCL 50 MG/ML IJ SOLN
50.0000 mg | Freq: Once | INTRAMUSCULAR | Status: AC
  Administered 2020-12-25: 50 mg via INTRAVENOUS

## 2020-12-25 MED ORDER — SODIUM CHLORIDE 0.9 % IV SOLN
75.0000 mg/m2 | Freq: Once | INTRAVENOUS | Status: AC
  Administered 2020-12-25: 180 mg via INTRAVENOUS
  Filled 2020-12-25: qty 18

## 2020-12-25 MED ORDER — ACETAMINOPHEN 325 MG PO TABS
ORAL_TABLET | ORAL | Status: AC
  Filled 2020-12-25: qty 2

## 2020-12-25 MED ORDER — DIPHENHYDRAMINE HCL 50 MG/ML IJ SOLN
INTRAMUSCULAR | Status: AC
  Filled 2020-12-25: qty 1

## 2020-12-25 MED ORDER — SODIUM CHLORIDE 0.9 % IV SOLN
10.0000 mg | Freq: Once | INTRAVENOUS | Status: AC
  Administered 2020-12-25: 10 mg via INTRAVENOUS
  Filled 2020-12-25: qty 10

## 2020-12-25 MED ORDER — HEPARIN SOD (PORK) LOCK FLUSH 100 UNIT/ML IV SOLN
500.0000 [IU] | Freq: Once | INTRAVENOUS | Status: DC
  Filled 2020-12-25: qty 5

## 2020-12-25 MED ORDER — SODIUM CHLORIDE 0.9 % IV SOLN
10.0000 mg/kg | Freq: Once | INTRAVENOUS | Status: AC
  Administered 2020-12-25: 1100 mg via INTRAVENOUS
  Filled 2020-12-25: qty 100

## 2020-12-25 MED ORDER — ACETAMINOPHEN 325 MG PO TABS
650.0000 mg | ORAL_TABLET | Freq: Once | ORAL | Status: AC
  Administered 2020-12-25: 650 mg via ORAL

## 2020-12-25 MED ORDER — HEPARIN SOD (PORK) LOCK FLUSH 100 UNIT/ML IV SOLN
500.0000 [IU] | Freq: Once | INTRAVENOUS | Status: DC | PRN
  Filled 2020-12-25: qty 5

## 2020-12-25 MED ORDER — SODIUM CHLORIDE 0.9 % IV SOLN
Freq: Once | INTRAVENOUS | Status: AC
  Filled 2020-12-25: qty 250

## 2020-12-25 MED ORDER — SODIUM CHLORIDE 0.9% FLUSH
10.0000 mL | Freq: Once | INTRAVENOUS | Status: AC
  Administered 2020-12-25: 10 mL
  Filled 2020-12-25: qty 10

## 2020-12-25 NOTE — Patient Instructions (Signed)

## 2020-12-25 NOTE — Progress Notes (Signed)
[facility] [phone]   [phone]  OFFICE PROGRESS NOTE  [name], MD [place] [address]  DIAGNOSIS: Stage IV (T2b, N3, M1C) non-small cell lung cancer, squamous cell carcinoma presented with right upper lobe lung mass in addition to mediastinal and right supraclavicular lymphadenopathy as well as multiple metastatic bone lesions diagnosed in March 2021.  PRIOR THERAPY: 1)Palliative radiotherapy to the painful metastatic bone lesions under the care of Dr. Sondra Come. Last dose 01/14/20 2)Palliative systemic chemotherapy withcarboplatin for AUC of 5, paclitaxel 175 NG/M2 and Keytruda 200 mg IV every 3 weeks with Neulasta support.Last dose on11/23/21. Status post9cycles.Starting from cycle #5 he will be on maintenance single agent Keytruda  CURRENT THERAPY: Palliative systemic chemotherapy with docetaxel 75 mg/m2 and Cyramza 10 mg/kg IV every 3 weeks with Neulasta support. First dose expected on 10/03/20.Status post 4 cycles  INTERVAL HISTORY: Mike [name] 55 y.o. male returns to the clinic today for follow-up visit accompanied by his girlfriend.  The patient is feeling fine today with no concerning complaints except for the pain on the left shoulder and right hip area.  He is currently on fentanyl patch as well as oxycodone.  He also takes some ibuprofen on as-needed basis.  He denied having any chest pain but has shortness of breath at baseline increased with exertion with cough and no hemoptysis.  He denied having any recent weight loss or night sweats.  He has no nausea, vomiting, diarrhea but has occasional constipation.  The patient is here today for evaluation before starting cycle #5 of his treatment.    MEDICAL HISTORY: Past Medical History:  Diagnosis Date  . Anemia   . Arthritis    neck  . Cancer, metastatic to bone ([place]) dx'd 10/2019  . Clotting disorder ([place])    previous blood clot  .  Diabetes ([place])   . Fatty liver 12/05/2019   By [place] 11/2019  . GERD (gastroesophageal reflux disease)   . 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 High cholesterol   . IBS (irritable bowel syndrome)   . lung ca dx'd 10/2019   lung stage 4   . OSA (obstructive sleep apnea) 09/15/2019   Sleep study 12/2017 - severe OSA with AHI 65.6, desat to 76% rec CPAP  . Severe obesity (BMI 35.0-39.9) with comorbidity ([place]) 09/04/2019    ALLERGIES:  has No Known Allergies.  MEDICATIONS:  Current Outpatient Medications  Medication Sig Dispense Refill  . albuterol (PROVENTIL) (2.5 MG/3ML) 0.083% nebulizer solution Take 3 mLs (2.5 mg total) by nebulization every 6 (six) hours as needed for wheezing or shortness of breath. 75 mL 12  . atorvastatin (LIPITOR) 20 MG tablet TAKE 1/2 TABLET BY MOUTH DAILY (Patient taking differently: Take 10 mg by mouth daily.) 45 tablet 0  . dexamethasone (DECADRON) 4 MG tablet Take 2 tablets TWICE a day the day before, the day of, and the day after chemotherapy. 60 tablet 2  . diazepam (VALIUM) 5 MG tablet Take 1 tablet (5 mg total) by mouth every 12 (twelve) hours as needed for anxiety or muscle spasms. 30 tablet 0  . docusate sodium (COLACE) 100 MG capsule Take 1 capsule (100 mg total) by mouth 2 (two) times daily. (Patient not taking: No sig reported) 10 capsule 0  . ELIQUIS 5 MG TABS tablet TAKE 1 TABLET BY MOUTH TWICE A DAY (Patient taking differently: Take 5 mg by mouth 2 (two)  times daily.) 60 tablet 3  . fentaNYL (DURAGESIC) 100 MCG/HR Place 1 patch onto the skin every 3 (three) days. 5 patch 0  . furosemide (LASIX) 20 MG tablet Take 2 tablets (40 mg total) by mouth daily for 7 days, THEN 1 tablet (20 mg total) daily. 97 tablet 0  . lidocaine-prilocaine (EMLA) cream Apply 1 application topically as needed. Apply 1 TBSP over port site  1-2 hours prior to treatment. Do not rub it in. Cover with plastic wrap. 30 g 0  . loratadine (CLARITIN) 10 MG tablet Take 10 mg by  mouth daily.    . magic mouthwash SOLN Take 5 mLs by mouth 4 (four) times daily. 240 mL 0  . metFORMIN (GLUCOPHAGE) 500 MG tablet Take 1 tablet (500 mg total) by mouth daily with breakfast. 90 tablet 1  . methocarbamol (ROBAXIN) 500 MG tablet Take 1 tablet (500 mg total) by mouth every 6 (six) hours as needed for muscle spasms. 60 tablet 6  . Multiple Vitamin (MULTIVITAMIN) tablet Take 1 tablet by mouth daily.    [name] oxyCODONE (OXY IR/ROXICODONE) 5 MG immediate release tablet Take 1 tablet (5 mg total) by mouth every 6 (six) hours as needed for severe pain. 30 tablet 0  . senna-docusate (SENOKOT-S) 8.6-50 MG tablet Take 2 tablets by mouth 2 (two) times daily as needed for mild constipation.    . sildenafil (REVATIO) 20 MG tablet Take 1-5 tablets (20-100 mg total) by mouth daily as needed (ED). (Patient taking differently: Take 40 mg by mouth daily as needed (ED).) 30 tablet 3  . ZIEXTENZO 6 MG/0.6ML injection INJECT 0.6 ML (6 MG TOTAL) UNDER THE SKIN EVERY 21 DAYS 0.6 mL 0   No current facility-administered medications for this visit.   Facility-Administered Medications Ordered in Other Visits  Medication Dose Route Frequency Provider Last Rate Last Admin  . heparin lock flush 100 unit/mL  500 Units Intracatheter Once [name], MD        SURGICAL HISTORY:  Past Surgical History:  Procedure Laterality Date  . BIOPSY  12/18/2019   Procedure: BIOPSY;  Surgeon: Garner Nash, DO;  Location: [facility];  Service: Pulmonary;;  . BRONCHIAL BRUSHINGS  12/18/2019   Procedure: BRONCHIAL BRUSHINGS;  Surgeon: Garner Nash, DO;  Location: [facility];  Service: Pulmonary;;  . BRONCHIAL WASHINGS  12/18/2019   Procedure: BRONCHIAL WASHINGS;  Surgeon: Garner Nash, DO;  Location: [facility];  Service: Pulmonary;;  . COLONOSCOPY  10/2019   mult TA, int hem, rpt 1 yr ([facility])  . CYSTECTOMY     knee- left knee cyst  . ENDOBRONCHIAL ULTRASOUND  12/18/2019   Procedure: ENDOBRONCHIAL  ULTRASOUND;  Surgeon: Garner Nash, DO;  Location: [facility];  Service: Pulmonary;;  . FINE NEEDLE ASPIRATION  12/18/2019   Procedure: FINE NEEDLE ASPIRATION;  Surgeon: Garner Nash, DO;  Location: [facility];  Service: Pulmonary;;  . FRACTURE SURGERY     left femur fracture  . INTRAMEDULLARY (IM) NAIL INTERTROCHANTERIC Left 07/25/2020   Procedure: LEFT INTERTROCHANTERIC INTRAMEDULLARY (IM) NAIL;  Surgeon: Leandrew Koyanagi, MD;  Location: [facility];  Service: Orthopedics;  Laterality: Left;  . IR IMAGING GUIDED PORT INSERTION  12/27/2019  . PILONIDAL CYST EXCISION    . VIDEO BRONCHOSCOPY WITH ENDOBRONCHIAL ULTRASOUND Right 12/18/2019   Procedure: VIDEO BRONCHOSCOPY;  Surgeon: Garner Nash, DO;  Location: [facility];  Service: Pulmonary;  Laterality: Right;    REVIEW OF SYSTEMS:  A comprehensive review of systems was negative  except for: Constitutional: positive for fatigue Respiratory: positive for cough and dyspnea on exertion Gastrointestinal: positive for constipation Musculoskeletal: positive for bone pain and muscle weakness   PHYSICAL EXAMINATION: General appearance: alert, cooperative, fatigued and no distress Head: Normocephalic, without obvious abnormality, atraumatic Neck: no adenopathy, no JVD, supple, symmetrical, trachea midline and thyroid not enlarged, symmetric, no tenderness/mass/nodules Lymph nodes: Cervical, supraclavicular, and axillary nodes normal. Resp: clear to auscultation bilaterally Back: symmetric, no curvature. ROM normal. No CVA tenderness. Cardio: regular rate and rhythm, S1, S2 normal, no murmur, click, rub or gallop GI: soft, non-tender; bowel sounds normal; no masses,  no organomegaly Extremities: extremities normal, atraumatic, no cyanosis or edema  ECOG PERFORMANCE STATUS: 1 - Symptomatic but completely ambulatory  Blood pressure 109/80, pulse (!) 103, temperature (!) 96.3 F (35.7 C), temperature source Tympanic, resp. rate 20, height 6'  (1.829 m), weight 232 lb (105.2 kg), SpO2 94 %.  LABORATORY DATA: Lab Results  Component Value Date   WBC 14.0 (H) 12/18/2020   HGB 10.0 (L) 12/18/2020   HCT 32.7 (L) 12/18/2020   MCV 95.3 12/18/2020   PLT 190 12/18/2020      Chemistry      Component Value Date/Time   NA 138 12/18/2020 0920   K 3.9 12/18/2020 0920   CL 100 12/18/2020 0920   CO2 29 12/18/2020 0920   BUN 6 12/18/2020 0920   CREATININE 0.63 12/18/2020 0920      Component Value Date/Time   CALCIUM 8.9 12/18/2020 0920   ALKPHOS 117 12/18/2020 0920   AST 16 12/18/2020 0920   ALT 7 12/18/2020 0920   BILITOT 0.2 (L) 12/18/2020 0920       RADIOGRAPHIC STUDIES: DG Chest 2 View  Result Date: 12/18/2020 CLINICAL DATA:  Worsening cough. History of squamous cell carcinoma of the right lung. EXAM: CHEST - 2 VIEW COMPARISON:  11/07/2020. FINDINGS: PowerPort catheter in stable position. Heart size stable. Persistent density right upper lung. Bilateral interstitial prominence noted on today's exam. Interstitial edema and or pneumonitis could present in this fashion. Other etiologies of interstitial disease interstitial tumor spread cannot be excluded. Small bilateral pleural effusions. No acute bony abnormality. Degenerative change in scoliosis thoracic spine. IMPRESSION: 1.  PowerPort catheter stable position. 2.  Persistent density right upper lung. 3. Diffuse bilateral interstitial prominence noted on today's exam. Interstitial edema and or pneumonitis could present in this fashion. Other etiologies of interstitial disease including interstitial tumor spread cannot be excluded. Small bilateral pleural effusions. Electronically Signed   By: Marcello Moores  Register   On: 12/18/2020 11:03    ASSESSMENT AND PLAN: This is a very pleasant 55 years old white male with a stage IV non-small cell lung cancer, squamous cell carcinoma presented with right upper lobe lung mass in addition to mediastinal and right supraclavicular lymphadenopathy  and multiple metastatic bone lesions diagnosed in March 2021 status post palliative radiotherapy to the painful metastatic lesions under the care of Dr. Sondra Come. The patient is currently undergoing palliative systemic chemotherapy initially with carboplatin, paclitaxel and Keytruda for 4 cycles and he is currently on the maintenance phase of his treatment with single agent Keytruda status post 5 more cycles.  This treatment was discontinued secondary to disease progression. The patient is currently undergoing second line chemotherapy with docetaxel 75 mg/M2 and Cyramza 10 mg/KG every 3 weeks with Neulasta support status post 4 cycles. The patient continues to tolerate this treatment well with no concerning complaints except for fatigue. I recommended for him to proceed with  cycle #5 today as planned. I will see him back for follow-up visit in 3 weeks for evaluation with repeat CT scan of the chest, abdomen pelvis for restaging of his disease. For the pain management, he will continue on fentanyl patch 100 mcg/hour every 3 days in addition to oxycodone for breakthrough pain and occasional ibuprofen if needed. The patient was advised to call immediately if he has any other concerning symptoms The patient voices understanding of current disease status and treatment options and is in agreement with the current care plan.  All questions were answered. The patient knows to call the clinic with any problems, questions or concerns. We can certainly see the patient much sooner if necessary.  Disclaimer: This note was dictated with voice recognition software. Similar sounding words can inadvertently be transcribed and may not be corrected upon review.

## 2020-12-25 NOTE — Patient Instructions (Signed)
Mike [name] Discharge Instructions for Patients Receiving Chemotherapy  Today you received the following chemotherapy agents Cyramza/Taxotere   To help prevent nausea and vomiting after your treatment, we encourage you to take your nausea medication as directed.    If you develop nausea and vomiting that is not controlled by your nausea medication, call the clinic.   BELOW ARE SYMPTOMS THAT SHOULD BE REPORTED IMMEDIATELY:  *FEVER GREATER THAN 100.5 F  *CHILLS WITH OR WITHOUT FEVER  NAUSEA AND VOMITING THAT IS NOT CONTROLLED WITH YOUR NAUSEA MEDICATION  *UNUSUAL SHORTNESS OF BREATH  *UNUSUAL BRUISING OR BLEEDING  TENDERNESS IN MOUTH AND THROAT WITH OR WITHOUT PRESENCE OF ULCERS  *URINARY PROBLEMS  *BOWEL PROBLEMS  UNUSUAL RASH Items with * indicate a potential emergency and should be followed up as soon as possible.  Feel free to call the clinic should you have any questions or concerns. The clinic phone number is [phone].  Please show the [place] at check-in to the Emergency Department and triage nurse.

## 2020-12-29 ENCOUNTER — Telehealth: Payer: Self-pay | Admitting: Medical Oncology

## 2020-12-29 ENCOUNTER — Other Ambulatory Visit: Payer: Self-pay | Admitting: Internal Medicine

## 2020-12-29 ENCOUNTER — Other Ambulatory Visit: Payer: Self-pay | Admitting: Medical Oncology

## 2020-12-29 ENCOUNTER — Telehealth: Payer: 59

## 2020-12-29 ENCOUNTER — Other Ambulatory Visit: Payer: Self-pay | Admitting: Family Medicine

## 2020-12-29 MED ORDER — OXYCODONE HCL 5 MG PO TABS: 5.0000 mg | ORAL_TABLET | Freq: Four times a day (QID) | ORAL | 0 refills | Status: DC | PRN

## 2020-12-29 NOTE — Telephone Encounter (Signed)
Pharmacy requests refill on: Diazepam 5 mg   LAST REFILL: 11/17/2020 (Q-30, R-0) LAST OV: 11/26/2020 NEXT OV: 05/27/2021 PHARMACY: [facility] [place], [place]

## 2020-12-29 NOTE — Telephone Encounter (Signed)
[facility] requested refill for oxycodone.

## 2020-12-30 ENCOUNTER — Other Ambulatory Visit: Payer: Self-pay | Admitting: Family Medicine

## 2020-12-30 ENCOUNTER — Other Ambulatory Visit: Payer: Self-pay | Admitting: Physician Assistant

## 2020-12-30 ENCOUNTER — Telehealth: Payer: Self-pay | Admitting: Medical Oncology

## 2020-12-30 DIAGNOSIS — G893 Neoplasm related pain (acute) (chronic): Secondary | ICD-10-CM

## 2020-12-30 MED ORDER — FENTANYL 100 MCG/HR TD PT72: 1.0000 | MEDICATED_PATCH | TRANSDERMAL | 0 refills | Status: AC

## 2020-12-30 NOTE — Telephone Encounter (Signed)
Refill [facility]

## 2020-12-31 ENCOUNTER — Telehealth: Payer: Self-pay | Admitting: *Deleted

## 2020-12-31 NOTE — Telephone Encounter (Signed)
Mickel Baas PT with [facility] left a voicemail stating that they are discharging patient from PT home health. Mickel Baas stated that patient has met all of his goals.

## 2020-12-31 NOTE — Telephone Encounter (Signed)
Noted  

## 2021-01-01 ENCOUNTER — Inpatient Hospital Stay: Payer: 59

## 2021-01-02 NOTE — Telephone Encounter (Signed)
ERx 

## 2021-01-07 ENCOUNTER — Telehealth: Payer: Self-pay | Admitting: Medical Oncology

## 2021-01-07 ENCOUNTER — Other Ambulatory Visit: Payer: Self-pay | Admitting: Internal Medicine

## 2021-01-07 MED ORDER — OXYCODONE HCL 5 MG PO TABS: 5.0000 mg | ORAL_TABLET | Freq: Four times a day (QID) | ORAL | 0 refills | Status: DC | PRN

## 2021-01-07 NOTE — Telephone Encounter (Signed)
Requested refill for oxycodone and appt for CT scan.

## 2021-01-07 NOTE — Telephone Encounter (Deleted)
Requested refill for oxycodone.

## 2021-01-08 ENCOUNTER — Inpatient Hospital Stay: Payer: 59

## 2021-01-08 ENCOUNTER — Other Ambulatory Visit: Payer: Self-pay

## 2021-01-08 DIAGNOSIS — Z95828 Presence of other vascular implants and grafts: Secondary | ICD-10-CM

## 2021-01-08 DIAGNOSIS — Z5112 Encounter for antineoplastic immunotherapy: Secondary | ICD-10-CM | POA: Diagnosis not present

## 2021-01-08 DIAGNOSIS — C3491 Malignant neoplasm of unspecified part of right bronchus or lung: Secondary | ICD-10-CM

## 2021-01-08 LAB — CMP (CANCER CENTER ONLY)
ALT: 7 U/L (ref 0–44)
AST: 18 U/L (ref 15–41)
Albumin: 2.3 g/dL — ABNORMAL LOW (ref 3.5–5.0)
Alkaline Phosphatase: 103 U/L (ref 38–126)
Anion gap: 10 (ref 5–15)
BUN: 10 mg/dL (ref 6–20)
CO2: 31 mmol/L (ref 22–32)
Calcium: 9.2 mg/dL (ref 8.9–10.3)
Chloride: 99 mmol/L (ref 98–111)
Creatinine: 0.6 mg/dL — ABNORMAL LOW (ref 0.61–1.24)
GFR, Estimated: >60 mL/min (ref >60)
Glucose, Bld: 103 mg/dL — ABNORMAL HIGH (ref 70–99)
Potassium: 4.3 mmol/L (ref 3.5–5.1)
Sodium: 140 mmol/L (ref 135–145)
Total Bilirubin: 0.2 mg/dL — ABNORMAL LOW (ref 0.3–1.2)
Total Protein: 6.3 g/dL — ABNORMAL LOW (ref 6.5–8.1)

## 2021-01-08 LAB — CBC WITH DIFFERENTIAL (CANCER CENTER ONLY)
Abs Immature Granulocytes: 0.11 10*3/uL — ABNORMAL HIGH (ref 0.00–0.07)
Basophils Absolute: 0 10*3/uL (ref 0.0–0.1)
Basophils Relative: 0 %
Eosinophils Absolute: 0 10*3/uL (ref 0.0–0.5)
Eosinophils Relative: 0 %
HCT: 30.6 % — ABNORMAL LOW (ref 39.0–52.0)
Hemoglobin: 9.7 g/dL — ABNORMAL LOW (ref 13.0–17.0)
Immature Granulocytes: 1 %
Lymphocytes Relative: 8 %
Lymphs Abs: 1 10*3/uL (ref 0.7–4.0)
MCH: 30 pg (ref 26.0–34.0)
MCHC: 31.7 g/dL (ref 30.0–36.0)
MCV: 94.7 fL (ref 80.0–100.0)
Monocytes Absolute: 0.8 10*3/uL (ref 0.1–1.0)
Monocytes Relative: 6 %
Neutro Abs: 11.1 10*3/uL — ABNORMAL HIGH (ref 1.7–7.7)
Neutrophils Relative %: 85 %
Platelet Count: 216 10*3/uL (ref 150–400)
RBC: 3.23 MIL/uL — ABNORMAL LOW (ref 4.22–5.81)
RDW: 20.1 % — ABNORMAL HIGH (ref 11.5–15.5)
WBC Count: 13 10*3/uL — ABNORMAL HIGH (ref 4.0–10.5)
nRBC: 0 % (ref 0.0–0.2)

## 2021-01-08 MED ORDER — SODIUM CHLORIDE 0.9% FLUSH
10.0000 mL | Freq: Once | INTRAVENOUS | Status: AC
  Administered 2021-01-08: 10 mL
  Filled 2021-01-08: qty 10

## 2021-01-08 MED ORDER — HEPARIN SOD (PORK) LOCK FLUSH 100 UNIT/ML IV SOLN
500.0000 [IU] | Freq: Once | INTRAVENOUS | Status: AC
  Administered 2021-01-08: 500 [IU]
  Filled 2021-01-08: qty 5

## 2021-01-08 NOTE — Patient Instructions (Signed)

## 2021-01-09 ENCOUNTER — Telehealth: Payer: Self-pay

## 2021-01-09 NOTE — Telephone Encounter (Signed)
Pt has a follow-up appt 01/15/21 but has not had his CT scan done.   I have called the pt and advised him to call Radiology to have this scheduled before his follow-up appt. Pt stated he has the phone number and will call to have it scheduled today.

## 2021-01-13 ENCOUNTER — Other Ambulatory Visit: Payer: Self-pay | Admitting: Internal Medicine

## 2021-01-13 ENCOUNTER — Telehealth: Payer: Self-pay | Admitting: Medical Oncology

## 2021-01-13 NOTE — Progress Notes (Signed)
[facility] OFFICE PROGRESS NOTE  [name], MD [place] [address]  DIAGNOSIS: Stage IV (T2b, N3, M1C) non-small cell lung cancer, squamous cell carcinoma presented with right upper lobe lung mass in addition to mediastinal and right supraclavicular lymphadenopathy as well as multiple metastatic bone lesions diagnosed in March 2021.  PRIOR THERAPY:  1)Palliative radiotherapy to the painful metastatic bone lesions under the care of Dr. Sondra Come. Last dose 01/14/20 2)Palliative systemic chemotherapy withcarboplatin for AUC of 5, paclitaxel 175 NG/M2 and Keytruda 200 mg IV every 3 weeks with Neulasta support.Last dose on11/23/21. Status post9cycles.Starting from cycle #5 he will be on maintenance single agent Keytruda  CURRENT THERAPY: Palliative systemic chemotherapy with docetaxel 75 mg/m2 and Cyramza 10 mg/kg IV every 3 weeks with Neulasta support. First dose expected on 10/03/20.Status post 5 cycles  INTERVAL HISTORY: Mike [name] 55 y.o. male returns to the clinic today for a follow-up visit accompanied by his girlfriend.  The patient is feeling fair today without any concerning complaints except for the pain on the left shoulder and right hip area. He also noticed an increase in his cough. He has required a thoracentesis in the past for a pleural effusion.  He is currently on fentanyl patch as well as oxycodone for pain management with fairly good control of his pain.  He also takes some ibuprofen on as-needed basis. He denies fever but had one night last week of chills and clamminess which resolved the next day.  He denied having any chest pain but has shortness of breath at baseline increased with exertion with cough and no hemoptysis.  He lost some weight. He has palliative care on board who were discussing transitioning to hospice/comfort care earlier this week. He has no nausea, vomiting, diarrhea, or constipation. The patient recently  had a restaging CT scan of the chest, abdomen, and pelvis performed. The patient is here today for evaluation and to review his scan results before starting cycle #6 of his treatment.   MEDICAL HISTORY: Past Medical History:  Diagnosis Date  . Anemia   . Arthritis    neck  . Cancer, metastatic to bone ([facility]) dx'd 10/2019  . Clotting disorder ([facility])    previous blood clot  . Diabetes ([facility])   . Fatty liver 12/05/2019   By [place] 11/2019  . GERD (gastroesophageal reflux disease)   . 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 High cholesterol   . IBS (irritable bowel syndrome)   . lung ca dx'd 10/2019   lung stage 4   . OSA (obstructive sleep apnea) 09/15/2019   Sleep study 12/2017 - severe OSA with AHI 65.6, desat to 76% rec CPAP  . Severe obesity (BMI 35.0-39.9) with comorbidity ([facility]) 09/04/2019    ALLERGIES:  has No Known Allergies.  MEDICATIONS:  Current Outpatient Medications  Medication Sig Dispense Refill  . albuterol (PROVENTIL) (2.5 MG/3ML) 0.083% nebulizer solution Take 3 mLs (2.5 mg total) by nebulization every 6 (six) hours as needed for wheezing or shortness of breath. 75 mL 12  . atorvastatin (LIPITOR) 20 MG tablet TAKE 1/2 TABLET BY MOUTH DAILY (Patient taking differently: Take 10 mg by mouth daily.) 45 tablet 0  . dexamethasone (DECADRON) 4 MG tablet Take 2 tablets TWICE a day the day before, the day of, and the day after chemotherapy. 60 tablet 2  . diazepam (VALIUM) 5 MG tablet TAKE 1 TABLET (5 MG TOTAL) BY MOUTH EVERY 12 HOURS  AS NEEDED FOR ANXIETY OR MUSCLE SPASMS. 30 tablet 0  . docusate sodium (COLACE) 100 MG capsule Take 1 capsule (100 mg total) by mouth 2 (two) times daily. 10 capsule 0  . ELIQUIS 5 MG TABS tablet TAKE 1 TABLET BY MOUTH TWICE A DAY (Patient taking differently: Take 5 mg by mouth 2 (two) times daily.) 60 tablet 3  . fentaNYL (DURAGESIC) 100 MCG/HR Place 1 patch onto the skin every 3 (three) days. 5 patch 0  . furosemide (LASIX) 20 MG tablet Take 2  tablets (40 mg total) by mouth daily for 7 days, THEN 1 tablet (20 mg total) daily. 97 tablet 0  . lidocaine-prilocaine (EMLA) cream Apply 1 application topically as needed. Apply 1 TBSP over port site  1-2 hours prior to treatment. Do not rub it in. Cover with plastic wrap. 30 g 0  . loratadine (CLARITIN) 10 MG tablet Take 10 mg by mouth daily.    . magic mouthwash SOLN Take 5 mLs by mouth 4 (four) times daily. 240 mL 0  . metFORMIN (GLUCOPHAGE) 500 MG tablet Take 1 tablet (500 mg total) by mouth daily with breakfast. 90 tablet 1  . methocarbamol (ROBAXIN) 500 MG tablet Take 1 tablet (500 mg total) by mouth every 6 (six) hours as needed for muscle spasms. 60 tablet 6  . Multiple Vitamin (MULTIVITAMIN) tablet Take 1 tablet by mouth daily.    [name] OVER THE COUNTER MEDICATION 1 Dose daily as needed (1 pouch placed under upper lip x 20 mins). "ONNICOTINE" pouch    . oxyCODONE-acetaminophen (PERCOCET) 10-325 MG tablet 1 tablet EVERY 6 HOURS (route: oral)    . senna-docusate (SENOKOT-S) 8.6-50 MG tablet Take 2 tablets by mouth 2 (two) times daily as needed for mild constipation.    . sildenafil (REVATIO) 20 MG tablet Take 1-5 tablets (20-100 mg total) by mouth daily as needed (ED). (Patient taking differently: Take 40 mg by mouth daily as needed (ED).) 30 tablet 3  . Skin Protectants, Misc. (CRITIC-AID CLEAR) OINT ointment Per instructions DAILY (route: topical)    . ZIEXTENZO 6 MG/0.6ML injection INJECT 0.6 ML (6 MG TOTAL) UNDER THE SKIN EVERY 21 DAYS 0.6 mL 0   No current facility-administered medications for this visit.    SURGICAL HISTORY:  Past Surgical History:  Procedure Laterality Date  . BIOPSY  12/18/2019   Procedure: BIOPSY;  Surgeon: Garner Nash, DO;  Location: [facility];  Service: Pulmonary;;  . BRONCHIAL BRUSHINGS  12/18/2019   Procedure: BRONCHIAL BRUSHINGS;  Surgeon: Garner Nash, DO;  Location: [facility];  Service: Pulmonary;;  . BRONCHIAL WASHINGS  12/18/2019   Procedure:  BRONCHIAL WASHINGS;  Surgeon: Garner Nash, DO;  Location: [facility];  Service: Pulmonary;;  . COLONOSCOPY  10/2019   mult TA, int hem, rpt 1 yr ([facility])  . CYSTECTOMY     knee- left knee cyst  . ENDOBRONCHIAL ULTRASOUND  12/18/2019   Procedure: ENDOBRONCHIAL ULTRASOUND;  Surgeon: Garner Nash, DO;  Location: [facility];  Service: Pulmonary;;  . FINE NEEDLE ASPIRATION  12/18/2019   Procedure: FINE NEEDLE ASPIRATION;  Surgeon: Garner Nash, DO;  Location: [facility];  Service: Pulmonary;;  . FRACTURE SURGERY     left femur fracture  . INTRAMEDULLARY (IM) NAIL INTERTROCHANTERIC Left 07/25/2020   Procedure: LEFT INTERTROCHANTERIC INTRAMEDULLARY (IM) NAIL;  Surgeon: Leandrew Koyanagi, MD;  Location: [facility];  Service: Orthopedics;  Laterality: Left;  . IR IMAGING GUIDED PORT INSERTION  12/27/2019  . PILONIDAL CYST  EXCISION    . VIDEO BRONCHOSCOPY WITH ENDOBRONCHIAL ULTRASOUND Right 12/18/2019   Procedure: VIDEO BRONCHOSCOPY;  Surgeon: Garner Nash, DO;  Location: [facility];  Service: Pulmonary;  Laterality: Right;    REVIEW OF SYSTEMS:   Review of Systems  Constitutional:Positive for decreased appetiteand fatigue. Negative for chills and fever. HENT:Negative for mouth nosebleeds, mouth sores, sore throat and trouble swallowing.  Eyes: Negative for eye problems and icterus.  Respiratory:Positive forstableshortness of breath and increased cough.Negative for hemoptysis and wheezing.  Cardiovascular:Positive for bilateral lower extremity pitting edema.  Right-sided chest pain with coughing occasionally.   Gastrointestinal:Negative for abdominal pain, diarrhea, constipation, nausea and vomiting.  Genitourinary: Negative for bladder incontinence, difficulty urinating, dysuria, frequency and hematuria.  Musculoskeletal:Positive for bone pain particularly in hip,back,left neck/left shoulder.Negative for neck stiffness.  Skin: Negative for itching and rash.   Neurological:Negative for dizziness, headache, extremity weakness, gait problem, light-headedness and seizures.  Hematological: Negative for adenopathy. Does not bruise/bleed easily.  Psychiatric/Behavioral: Negative for confusion, depression and sleep disturbance. The patient is not nervous/anxious.     PHYSICAL EXAMINATION:  Blood pressure 111/76, pulse 99, temperature (!) 97.3 F (36.3 C), temperature source Tympanic, resp. rate 20, height 6' (1.829 m), weight 220 lb 11.2 oz (100.1 kg), SpO2 95 %.  ECOG PERFORMANCE STATUS: 1-2   Physical Exam  Constitutional: Oriented to person, place, and time and well-developed, well-nourished, and in no distress.  HENT:  Head: Normocephalic and atraumatic. Eyes: Conjunctivae are normal. Right eye exhibits no discharge. Left eye exhibits no discharge. No scleral icterus.  Neck: Normal range of motion. Neck supple.  Cardiovascular: Normal rate, regular rhythm, normal heart sounds and intact distal pulses.  Pulmonary/Chest: Effort normal. Decrease breath sounds on the right side. No respiratory distress. No rales.  Abdominal: Soft. Bowel sounds are normal. Exhibits no distension and no mass. There is no tenderness.  Musculoskeletal: Lower extremity pittingedemabilateral.Normal range of motion.  Lymphadenopathy:  No cervical adenopathy.  Neurological: Alert and oriented to person, place, and time. Exhibits normal muscle tone. The patient was examined in the wheelchair. Skin: Skin is warm and dry. No rash noted. Not diaphoretic. No erythema. No pallor.  Psychiatric: Mood, memory and judgment normal.  Vitals reviewed.  LABORATORY DATA: Lab Results  Component Value Date   WBC 14.8 (H) 01/15/2021   HGB 10.1 (L) 01/15/2021   HCT 32.8 (L) 01/15/2021   MCV 95.3 01/15/2021   PLT 276 01/15/2021      Chemistry      Component Value Date/Time   NA 141 01/15/2021 0943   K 4.4 01/15/2021 0943   CL 100 01/15/2021 0943   CO2 30 01/15/2021  0943   BUN 9 01/15/2021 0943   CREATININE 0.62 01/15/2021 0943      Component Value Date/Time   CALCIUM 9.6 01/15/2021 0943   ALKPHOS 91 01/15/2021 0943   AST 14 (L) 01/15/2021 0943   ALT 8 01/15/2021 0943   BILITOT 0.3 01/15/2021 0943       RADIOGRAPHIC STUDIES:  DG Chest 2 View  Result Date: 12/18/2020 CLINICAL DATA:  Worsening cough. History of squamous cell carcinoma of the right lung. EXAM: CHEST - 2 VIEW COMPARISON:  11/07/2020. FINDINGS: PowerPort catheter in stable position. Heart size stable. Persistent density right upper lung. Bilateral interstitial prominence noted on today's exam. Interstitial edema and or pneumonitis could present in this fashion. Other etiologies of interstitial disease interstitial tumor spread cannot be excluded. Small bilateral pleural effusions. No acute bony abnormality. Degenerative change in scoliosis  thoracic spine. IMPRESSION: 1.  PowerPort catheter stable position. 2.  Persistent density right upper lung. 3. Diffuse bilateral interstitial prominence noted on today's exam. Interstitial edema and or pneumonitis could present in this fashion. Other etiologies of interstitial disease including interstitial tumor spread cannot be excluded. Small bilateral pleural effusions. Electronically Signed   By: Marcello Moores  Register   On: 12/18/2020 11:03   CT Chest W Contrast  Result Date: 01/15/2021 CLINICAL DATA:  Non-small cell lung cancer restaging EXAM: CT CHEST, ABDOMEN, AND PELVIS WITH CONTRAST TECHNIQUE: Multidetector CT imaging of the chest, abdomen and pelvis was performed following the standard protocol during bolus administration of intravenous contrast. CONTRAST:  142mL OMNIPAQUE IOHEXOL 300 MG/ML SOLN, additional oral enteric contrast COMPARISON:  11/06/2020 FINDINGS: CT CHEST FINDINGS Cardiovascular: Left chest port catheter. Scattered aortic atherosclerosis. Normal heart size. Scattered left coronary artery calcifications. No pericardial effusion.  Mediastinum/Nodes: Unchanged enlargement of right hilar, subcarinal, AP window, and left supraclavicular lymph nodes (series 2, image 20). Thyroid gland, trachea, and esophagus demonstrate no significant findings. Lungs/Pleura: Moderate right pleural effusion associated atelectasis or consolidation, slightly increased compared to prior examination. New, small left pleural effusion and associated atelectasis or consolidation. Slight interval enlargement of a spiculated right upper lobe mass, measuring 4.8 x 3.0 cm, previously 3.9 x 2.6 cm when measured similarly (series 4, image 54). Musculoskeletal: No chest wall mass. CT ABDOMEN PELVIS FINDINGS Hepatobiliary: Interval enlargement of a previously seen hypodense lesion of posterolateral liver dome, hepatic segment VII, measuring 2.3 x 2.0 cm, previously 1.1 cm (series 2, image 54). There are multiple new additional small hypodense lesions throughout the liver parenchyma, for example a 0.9 cm lesion of the anterior liver dome (series 2, image 49). No gallstones, gallbladder wall thickening, or biliary dilatation. Pancreas: Unremarkable. No pancreatic ductal dilatation or surrounding inflammatory changes. Spleen: Normal in size without significant abnormality. Adrenals/Urinary Tract: Adrenal glands are unremarkable. Kidneys are normal, without renal calculi, solid lesion, or hydronephrosis. Bladder is unremarkable. Stomach/Bowel: Stomach is within normal limits. Appendix appears normal. No evidence of bowel wall thickening, distention, or inflammatory changes. Vascular/Lymphatic: Aortic atherosclerosis. Newly enlarged celiac axis or gastrohepatic ligament lymph node measuring 1.4 x 1.3 cm (series 2, image 58). Newly enlarged aortocaval lymph nodes measuring up to 1.1 x 0.9 cm (series, image 70). Reproductive: No mass or other abnormality. Other: No abdominal wall hernia or abnormality. No abdominopelvic ascites. Musculoskeletal: Multiple mixed lytic osseous lesions are  again seen throughout skeleton, including of the head of the left clavicle, the head of the left humerus, and the pelvis. Lesions of the right iliac wing and left ilium abutting the sacroiliac joint are significantly worsened compared to prior examination (series 2, image 102, 92). IMPRESSION: 1. Slight interval enlargement of a spiculated right upper lobe. 2. Unchanged enlargement of right hilar, subcarinal, AP window, and left supraclavicular lymph nodes. 3. Moderate right pleural effusion with associated atelectasis or consolidation, slightly increased compared to prior examination. New, small left pleural effusion and associated atelectasis or consolidation. 4. Interval enlargement of a previously seen hypodense lesion of the posterolateral liver dome, as well as multiple new additional small hypodense lesions throughout the liver. 5. Newly enlarged celiac axis or gastrohepatic ligament lymph node as well as newly enlarged aortocaval lymph nodes. 6. Multiple mixed lytic osseous lesions are again seen throughout skeleton, including of the head of the left clavicle, the head of the left humerus, and the pelvis. Lesions of the right iliac wing and left ilium abutting the sacroiliac joint are  significantly worsened compared to prior examination. 7. Overall constellation of findings is consistent with worsened primary lung malignancy and distant metastatic disease. 8. Coronary artery. Aortic Atherosclerosis (ICD10-I70.0). Electronically Signed   By: [name] Candle M.D.   On: 01/15/2021 08:56   CT Abdomen Pelvis W Contrast  Result Date: 01/15/2021 CLINICAL DATA:  Non-small cell lung cancer restaging EXAM: CT CHEST, ABDOMEN, AND PELVIS WITH CONTRAST TECHNIQUE: Multidetector CT imaging of the chest, abdomen and pelvis was performed following the standard protocol during bolus administration of intravenous contrast. CONTRAST:  131mL OMNIPAQUE IOHEXOL 300 MG/ML SOLN, additional oral enteric contrast COMPARISON:   11/06/2020 FINDINGS: CT CHEST FINDINGS Cardiovascular: Left chest port catheter. Scattered aortic atherosclerosis. Normal heart size. Scattered left coronary artery calcifications. No pericardial effusion. Mediastinum/Nodes: Unchanged enlargement of right hilar, subcarinal, AP window, and left supraclavicular lymph nodes (series 2, image 20). Thyroid gland, trachea, and esophagus demonstrate no significant findings. Lungs/Pleura: Moderate right pleural effusion associated atelectasis or consolidation, slightly increased compared to prior examination. New, small left pleural effusion and associated atelectasis or consolidation. Slight interval enlargement of a spiculated right upper lobe mass, measuring 4.8 x 3.0 cm, previously 3.9 x 2.6 cm when measured similarly (series 4, image 54). Musculoskeletal: No chest wall mass. CT ABDOMEN PELVIS FINDINGS Hepatobiliary: Interval enlargement of a previously seen hypodense lesion of posterolateral liver dome, hepatic segment VII, measuring 2.3 x 2.0 cm, previously 1.1 cm (series 2, image 54). There are multiple new additional small hypodense lesions throughout the liver parenchyma, for example a 0.9 cm lesion of the anterior liver dome (series 2, image 49). No gallstones, gallbladder wall thickening, or biliary dilatation. Pancreas: Unremarkable. No pancreatic ductal dilatation or surrounding inflammatory changes. Spleen: Normal in size without significant abnormality. Adrenals/Urinary Tract: Adrenal glands are unremarkable. Kidneys are normal, without renal calculi, solid lesion, or hydronephrosis. Bladder is unremarkable. Stomach/Bowel: Stomach is within normal limits. Appendix appears normal. No evidence of bowel wall thickening, distention, or inflammatory changes. Vascular/Lymphatic: Aortic atherosclerosis. Newly enlarged celiac axis or gastrohepatic ligament lymph node measuring 1.4 x 1.3 cm (series 2, image 58). Newly enlarged aortocaval lymph nodes measuring up to 1.1  x 0.9 cm (series, image 70). Reproductive: No mass or other abnormality. Other: No abdominal wall hernia or abnormality. No abdominopelvic ascites. Musculoskeletal: Multiple mixed lytic osseous lesions are again seen throughout skeleton, including of the head of the left clavicle, the head of the left humerus, and the pelvis. Lesions of the right iliac wing and left ilium abutting the sacroiliac joint are significantly worsened compared to prior examination (series 2, image 102, 92). IMPRESSION: 1. Slight interval enlargement of a spiculated right upper lobe. 2. Unchanged enlargement of right hilar, subcarinal, AP window, and left supraclavicular lymph nodes. 3. Moderate right pleural effusion with associated atelectasis or consolidation, slightly increased compared to prior examination. New, small left pleural effusion and associated atelectasis or consolidation. 4. Interval enlargement of a previously seen hypodense lesion of the posterolateral liver dome, as well as multiple new additional small hypodense lesions throughout the liver. 5. Newly enlarged celiac axis or gastrohepatic ligament lymph node as well as newly enlarged aortocaval lymph nodes. 6. Multiple mixed lytic osseous lesions are again seen throughout skeleton, including of the head of the left clavicle, the head of the left humerus, and the pelvis. Lesions of the right iliac wing and left ilium abutting the sacroiliac joint are significantly worsened compared to prior examination. 7. Overall constellation of findings is consistent with worsened primary lung malignancy and distant metastatic  disease. 8. Coronary artery. Aortic Atherosclerosis (ICD10-I70.0). Electronically Signed   By: [name] Candle M.D.   On: 01/15/2021 08:56     ASSESSMENT/PLAN:  This is a very pleasant 89 year oldCaucasianmale recently diagnosed with a stage IV (T2b, N3, M1C) non-small cell lung cancer, squamous cell carcinoma presented with right upper lobe lung mass in  addition to mediastinal and right supraclavicular lymphadenopathy as well as multiple metastatic bone lesions diagnosed in March 2021.  The patientcompletedpalliative radiotherapy to the painful bone lesions under the care of Dr.Kinard.Last treatment on 01/14/20.  The patient had a stabilization of the impending pathological fracture of the left hip in October 2021.  The patient underwent palliative systemic chemotherapy with carboplatin for an AUC of 5, paclitaxel 175 mg per metered squared, Keytruda 200 mg IV every 3 weeks with Neulasta support.He is status post 9 cycles. This was discontinued due to evidence for disease progression.  The patient recently had evidence for disease progression. His treatment was switched to docetaxel 75 mg/m2 and Cyramza 10 mg/kg IV every 3 weeks with Neulasta support. He is status post 5 cycles and toleratedfair except for fatigue and mouth sores.   The patient recently had a restaging CT scan performed. The scan was previously reviewed with Dr. Julien Nordmann prior to this appointment. The scan showed evidence of disease progression with worsening metastatic bone lesions int he right iliac wing and left ilium. He also has multiple new liver lesions. The scan also shows a moderate right pleural effusion. I had a lengthy discussion with the patient about his current condition and treatment options. I gave him the option of palliative care/hospice vs single agent chemotherapy with gemcitabine. Discussed that the response rate for gemcitabine is low but is an option. I also gave him a handout on gemcitabine. He would like to take the weekend to think about his options. He is already established with a palliative care/hospice company. If he moves forward with hospice prior to his next appointment. He will let us know.   I will arrange for a follow up visit with Dr. Julien Nordmann early next week to review his options again.   In the meantime, for his cough, I suspect this is  partially due to his moderate right pleural effusion. He is requesting this to be drained on 01/19/21 as he has some business to take care of tomorrow and this weekend. I have placed the order. For the cough, recommend he try delsym to help symptomatically for now.   He will continue on the same pain management regimen for now. I had refilled his oxycodone yesterday.   The patient was advised to call immediately if he has any concerning symptoms in the interval. The patient voices understanding of current disease status and treatment options and is in agreement with the current care plan. All questions were answered. The patient knows to call the clinic with any problems, questions or concerns. We can certainly see the patient much sooner if necessary       Orders Placed This Encounter  Procedures  . US Thoracentesis Asp Pleural space w/IMG guide    Standing Status:   Future    Standing Expiration Date:   01/15/2022    Order Specific Question:   Are labs required for specimen collection?    Answer:   No    Order Specific Question:   Reason for Exam (SYMPTOM  OR DIAGNOSIS REQUIRED)    Answer:   Lung Cancer, cough, right pleural effusion  Order Specific Question:   Preferred imaging location?    Answer:   [facility]    Order Specific Question:   Release to patient    Answer:   Immediate     I spent 30-39 minutes in this encounter  Mike Mcglown L Nieko Clarin, PA-C 01/15/21

## 2021-01-13 NOTE — Telephone Encounter (Signed)
Contrast left at arrival desk-pt notified to pick it up.

## 2021-01-14 ENCOUNTER — Ambulatory Visit (HOSPITAL_COMMUNITY)
Admission: RE | Admit: 2021-01-14 | Discharge: 2021-01-14 | Disposition: A | Discharge status: Home / Self Care | Payer: 59 | Source: Ambulatory Visit | Attending: Internal Medicine | Admitting: Internal Medicine

## 2021-01-14 ENCOUNTER — Other Ambulatory Visit: Payer: Self-pay | Admitting: Physician Assistant

## 2021-01-14 ENCOUNTER — Other Ambulatory Visit: Payer: Self-pay | Admitting: Medical Oncology

## 2021-01-14 ENCOUNTER — Other Ambulatory Visit: Payer: Self-pay

## 2021-01-14 ENCOUNTER — Telehealth: Payer: Self-pay | Admitting: Medical Oncology

## 2021-01-14 DIAGNOSIS — C3491 Malignant neoplasm of unspecified part of right bronchus or lung: Secondary | ICD-10-CM

## 2021-01-14 DIAGNOSIS — C3490 Malignant neoplasm of unspecified part of unspecified bronchus or lung: Secondary | ICD-10-CM | POA: Insufficient documentation

## 2021-01-14 DIAGNOSIS — G893 Neoplasm related pain (acute) (chronic): Secondary | ICD-10-CM

## 2021-01-14 MED ORDER — HEPARIN SOD (PORK) LOCK FLUSH 100 UNIT/ML IV SOLN
INTRAVENOUS | Status: AC
  Filled 2021-01-14: qty 5

## 2021-01-14 MED ORDER — HEPARIN SOD (PORK) LOCK FLUSH 100 UNIT/ML IV SOLN
500.0000 [IU] | Freq: Once | INTRAVENOUS | Status: AC
  Administered 2021-01-14: 500 [IU] via INTRAVENOUS

## 2021-01-14 MED ORDER — OXYCODONE HCL 5 MG PO TABS: 5.0000 mg | ORAL_TABLET | Freq: Four times a day (QID) | ORAL | 0 refills | Status: DC | PRN

## 2021-01-14 MED ORDER — IOHEXOL 300 MG/ML  SOLN
100.0000 mL | Freq: Once | INTRAMUSCULAR | Status: AC | PRN
  Administered 2021-01-14: 100 mL via INTRAVENOUS

## 2021-01-14 MED FILL — Dexamethasone Sodium Phosphate Inj 100 MG/10ML: INTRAMUSCULAR | Qty: 1 | Status: AC

## 2021-01-14 NOTE — Telephone Encounter (Signed)
Requested refill for Oxycodone.

## 2021-01-15 ENCOUNTER — Encounter: Payer: Self-pay | Admitting: Physician Assistant

## 2021-01-15 ENCOUNTER — Inpatient Hospital Stay: Payer: 59

## 2021-01-15 ENCOUNTER — Inpatient Hospital Stay (HOSPITAL_BASED_OUTPATIENT_CLINIC_OR_DEPARTMENT_OTHER): Payer: 59 | Admitting: Physician Assistant

## 2021-01-15 VITALS — BP 111/76 | HR 99 | Temp 97.3°F | Resp 20 | Ht 72.0 in | Wt 220.7 lb

## 2021-01-15 DIAGNOSIS — C3491 Malignant neoplasm of unspecified part of right bronchus or lung: Secondary | ICD-10-CM

## 2021-01-15 DIAGNOSIS — J90 Pleural effusion, not elsewhere classified: Secondary | ICD-10-CM

## 2021-01-15 DIAGNOSIS — Z95828 Presence of other vascular implants and grafts: Secondary | ICD-10-CM

## 2021-01-15 DIAGNOSIS — Z5112 Encounter for antineoplastic immunotherapy: Secondary | ICD-10-CM | POA: Diagnosis not present

## 2021-01-15 LAB — CBC WITH DIFFERENTIAL (CANCER CENTER ONLY)
Abs Immature Granulocytes: 0.05 10*3/uL (ref 0.00–0.07)
Basophils Absolute: 0 10*3/uL (ref 0.0–0.1)
Basophils Relative: 0 %
Eosinophils Absolute: 0 10*3/uL (ref 0.0–0.5)
Eosinophils Relative: 0 %
HCT: 32.8 % — ABNORMAL LOW (ref 39.0–52.0)
Hemoglobin: 10.1 g/dL — ABNORMAL LOW (ref 13.0–17.0)
Immature Granulocytes: 0 %
Lymphocytes Relative: 4 %
Lymphs Abs: 0.6 10*3/uL — ABNORMAL LOW (ref 0.7–4.0)
MCH: 29.4 pg (ref 26.0–34.0)
MCHC: 30.8 g/dL (ref 30.0–36.0)
MCV: 95.3 fL (ref 80.0–100.0)
Monocytes Absolute: 0.8 10*3/uL (ref 0.1–1.0)
Monocytes Relative: 5 %
Neutro Abs: 13.4 10*3/uL — ABNORMAL HIGH (ref 1.7–7.7)
Neutrophils Relative %: 91 %
Platelet Count: 276 10*3/uL (ref 150–400)
RBC: 3.44 MIL/uL — ABNORMAL LOW (ref 4.22–5.81)
RDW: 19.9 % — ABNORMAL HIGH (ref 11.5–15.5)
WBC Count: 14.8 10*3/uL — ABNORMAL HIGH (ref 4.0–10.5)
nRBC: 0 % (ref 0.0–0.2)

## 2021-01-15 LAB — CMP (CANCER CENTER ONLY)
ALT: 8 U/L (ref 0–44)
AST: 14 U/L — ABNORMAL LOW (ref 15–41)
Albumin: 2.3 g/dL — ABNORMAL LOW (ref 3.5–5.0)
Alkaline Phosphatase: 91 U/L (ref 38–126)
Anion gap: 11 (ref 5–15)
BUN: 9 mg/dL (ref 6–20)
CO2: 30 mmol/L (ref 22–32)
Calcium: 9.6 mg/dL (ref 8.9–10.3)
Chloride: 100 mmol/L (ref 98–111)
Creatinine: 0.62 mg/dL (ref 0.61–1.24)
GFR, Estimated: >60 mL/min (ref >60)
Glucose, Bld: 194 mg/dL — ABNORMAL HIGH (ref 70–99)
Potassium: 4.4 mmol/L (ref 3.5–5.1)
Sodium: 141 mmol/L (ref 135–145)
Total Bilirubin: 0.3 mg/dL (ref 0.3–1.2)
Total Protein: 6.8 g/dL (ref 6.5–8.1)

## 2021-01-15 MED ORDER — SODIUM CHLORIDE 0.9% FLUSH
10.0000 mL | Freq: Once | INTRAVENOUS | Status: AC
  Administered 2021-01-15: 10 mL
  Filled 2021-01-15: qty 10

## 2021-01-15 MED ORDER — HEPARIN SOD (PORK) LOCK FLUSH 100 UNIT/ML IV SOLN
500.0000 [IU] | Freq: Once | INTRAVENOUS | Status: AC
  Administered 2021-01-15: 500 [IU]
  Filled 2021-01-15: qty 5

## 2021-01-15 NOTE — Patient Instructions (Signed)
Implanted Port Insertion, Care After This sheet gives you information about how to care for yourself after your procedure. Your health care provider may also give you more specific instructions. If you have problems or questions, contact your health care provider. What can I expect after the procedure? After the procedure, it is common to have:  Discomfort at the port insertion site.  Bruising on the skin over the port. This should improve over 3-4 days. Follow these instructions at home: Port care  After your port is placed, you will get a manufacturer's information card. The card has information about your port. Keep this card with you at all times.  Take care of the port as told by your health care provider. Ask your health care provider if you or a family member can get training for taking care of the port at home. A home health care nurse may also take care of the port.  Make sure to remember what type of port you have. Incision care  Follow instructions from your health care provider about how to take care of your port insertion site. Make sure you: ? Wash your hands with soap and water before and after you change your bandage (dressing). If soap and water are not available, use hand sanitizer. ? Change your dressing as told by your health care provider. ? Leave stitches (sutures), skin glue, or adhesive strips in place. These skin closures may need to stay in place for 2 weeks or longer. If adhesive strip edges start to loosen and curl up, you may trim the loose edges. Do not remove adhesive strips completely unless your health care provider tells you to do that.  Check your port insertion site every day for signs of infection. Check for: ? Redness, swelling, or pain. ? Fluid or blood. ? Warmth. ? Pus or a bad smell.      Activity  Return to your normal activities as told by your health care provider. Ask your health care provider what activities are safe for you.  Do not  lift anything that is heavier than 10 lb (4.5 kg), or the limit that you are told, until your health care provider says that it is safe. General instructions  Take over-the-counter and prescription medicines only as told by your health care provider.  Do not take baths, swim, or use a hot tub until your health care provider approves. Ask your health care provider if you may take showers. You may only be allowed to take sponge baths.  Do not drive for 24 hours if you were given a sedative during your procedure.  Wear a medical alert bracelet in case of an emergency. This will tell any health care providers that you have a port.  Keep all follow-up visits as told by your health care provider. This is important. Contact a health care provider if:  You cannot flush your port with saline as directed, or you cannot draw blood from the port.  You have a fever or chills.  You have redness, swelling, or pain around your port insertion site.  You have fluid or blood coming from your port insertion site.  Your port insertion site feels warm to the touch.  You have pus or a bad smell coming from the port insertion site. Get help right away if:  You have chest pain or shortness of breath.  You have bleeding from your port that you cannot control. Summary  Take care of the port as told by your   health care provider. Keep the manufacturer's information card with you at all times.  Change your dressing as told by your health care provider.  Contact a health care provider if you have a fever or chills or if you have redness, swelling, or pain around your port insertion site.  Keep all follow-up visits as told by your health care provider. This information is not intended to replace advice given to you by your health care provider. Make sure you discuss any questions you have with your health care provider. Document Revised: 05/02/2018 Document Reviewed: 05/02/2018 Elsevier Patient Education   2021 Elsevier Inc.  

## 2021-01-16 ENCOUNTER — Ambulatory Visit: Payer: 59

## 2021-01-16 ENCOUNTER — Telehealth: Payer: Self-pay | Admitting: Family Medicine

## 2021-01-16 ENCOUNTER — Other Ambulatory Visit: Payer: Self-pay

## 2021-01-16 ENCOUNTER — Other Ambulatory Visit
Admission: RE | Admit: 2021-01-16 | Discharge: 2021-01-16 | Disposition: A | Discharge status: Home / Self Care | Payer: 59 | Source: Ambulatory Visit | Attending: Physician Assistant | Admitting: Physician Assistant

## 2021-01-16 ENCOUNTER — Telehealth: Payer: Self-pay | Admitting: Physician Assistant

## 2021-01-16 DIAGNOSIS — C3491 Malignant neoplasm of unspecified part of right bronchus or lung: Secondary | ICD-10-CM

## 2021-01-16 DIAGNOSIS — Z01812 Encounter for preprocedural laboratory examination: Secondary | ICD-10-CM | POA: Insufficient documentation

## 2021-01-16 DIAGNOSIS — Z20822 Contact with and (suspected) exposure to covid-19: Secondary | ICD-10-CM | POA: Diagnosis not present

## 2021-01-16 DIAGNOSIS — I5032 Chronic diastolic (congestive) heart failure: Secondary | ICD-10-CM

## 2021-01-16 NOTE — Telephone Encounter (Signed)
Home Health verbal orders-caller/Agency: [facility]  Callback number: [phone]   Requesting OT/PT/Skilled nursing/Social Work/Speech: hospice - they can actually see him today   Reason: 3 month prognosis from cancer  Frequency: dont know    He does have access to epic.

## 2021-01-16 NOTE — Telephone Encounter (Signed)
Spoke with Tim asking for clarification of order request.  States the request is just for hospice.  I informed him Dr. Darnell Level is giving verbal orders for hospice.   Also, a hospice referral form was dropped off.  Tim asks that it be completed and faxed back to him.   Placed form in Dr. Synthia Innocent box.

## 2021-01-16 NOTE — Telephone Encounter (Signed)
Scheduled per los. Called and spoke with patient. Patient could comeon 4/6 not 4/4 or 4/5 as requested iin los. Confirmed appts

## 2021-01-16 NOTE — Patient Instructions (Signed)
Visit Information: Thank you for taking the time to speak with me today.  Goals Addressed            This Visit's Progress   . Keep Pain Under Control   On track    Timeframe:  Long-Range Goal Priority:  High Start Date:      12/02/20                      Expected End Date:    04/16/21          Follow up: 01/30/2021     . Will attend all scheduled provider appointments . Continue to call pharmacy for medication refills 7 days prior to needed refill date . Call provider office to discuss breakthrough neck/ shoulder arthritis pain.  . - do something like read, listen to music or watch television to keep mind off pain . - learn relaxation techniques . - Continue to exercise or plan activity when pain is best controlled . - prioritize tasks for the day . - track what makes the pain worse and what makes it better . - use pain medication as prescribed by your doctor.   Why is this important?   Day-to-day life can be hard when you have pain.  Even a small change in emotion or a physical problem can make pain better or worse.  Coping with pain depends on how the mind and body reacts to pain.  Pain medicine is just one piece of the treatment puzzle.  There are many tools to help manage pain. A combination of them can be used to best meet your needs.         . Manage fatigue symptoms   On track    Timeframe:  Long-Range Goal Priority:   Medium Start Date:       12/02/20                      Expected End Date:   04/16/2021   Follow up:  01/30/2021        . - do not eat or exercise right before bedtime . - get outdoors every day (weather permitting) Walking is great at helping decrease fatigue.  . - take a warm shower or bath before bed . - use meditation or relaxation techniques   Why is this important?   Cancer treatment and its side effects can drain your energy. It can keep you from doing things you would like to do.  There are many things that you can do to manage fatigue.         . Manage my heart failure   On track    Timeframe:  Long-Range Goal Priority:  High Start Date:    12/02/20                         Expected End Date:  04/16/2021                    Follow Up Date 01/30/2021    - eat more whole grains, fruits and vegetables, lean meats and healthy fats - follow rescue plan if symptoms flare-up, Call your doctor for worsening symptoms.  - track symptoms and what helps feel better or worse: Elevate your feet when sitting to help reduce swelling.  - dress right for the weather, hot or cold  - Review Heart Failure action plan/ zones sent to you in My  Chart - Follow up with your providers as recommended - Take your medications as prescribed.  - Monitor for signs/ symptoms of heart failure:  Weight gain of 3 pounds overnight or 5 lbs in a week, swelling in feet, ankles, stomach or hands, chest discomfort or heaviness, more tired/ less energy, dry hacky cough, new or worsening dizziness, uneasy feeling that something is not right. Notify your doctor of symptoms.     Why is this important?    You will be able to handle your symptoms better if you keep track of them.   Making some simple changes to your lifestyle will help.   Eating healthy is one thing you can do to take good care of yourself.         Patient verbalizes understanding of instructions provided today and agrees to view in [place].   The patient has been provided with contact information for the care management team and has been advised to call with any health related questions or concerns.  The care management team will reach out to the patient again over the next 30 days.   Quinn Plowman RN,BSN,CCM RN Case Manager [place]  [phone]

## 2021-01-16 NOTE — Telephone Encounter (Signed)
Faxed form.

## 2021-01-16 NOTE — Telephone Encounter (Addendum)
Agree with home hospice referral.  plz clarify - is hospice asking for PT/OT/SN/SW/speech also?

## 2021-01-16 NOTE — Chronic Care Management (AMB) (Signed)
Care Management    RN Visit Note  01/16/2021 Name: Mike [name] MRN: [id number] DOB: [birth year]  Subjective: Mike [name] is a 55 y.o. year old male who is a primary care patient of [name], MD. The care management team was consulted for assistance with disease management and care coordination needs.    Engaged with patient by telephone for follow up visit in response to provider referral for case management and/or care coordination services.   Consent to Services:   Mike [name] was given information about Care Management services today including:  1. Care Management services includes personalized support from designated clinical staff supervised by his physician, including individualized plan of care and coordination with other care providers 2. 24/7 contact phone numbers for assistance for urgent and routine care needs. 3. The patient may stop case management services at any time by phone call to the office staff.  Patient agreed to services and consent obtained.   Assessment: Review of patient past medical history, allergies, medications, health status, including review of consultants reports, laboratory and other test data, was performed as part of comprehensive evaluation and provision of chronic care management services.   SDOH (Social Determinants of Health) assessments and interventions performed:  SDOH Interventions   Flowsheet Row Most Recent Value  SDOH Interventions   Food Insecurity Interventions Intervention Not Indicated  Transportation Interventions Intervention Not Indicated       Care Plan  No Known Allergies  Outpatient Encounter Medications as of 01/16/2021  Medication Sig Note  . albuterol (PROVENTIL) (2.5 MG/3ML) 0.083% nebulizer solution Take 3 mLs (2.5 mg total) by nebulization every 6 (six) hours as needed for wheezing or shortness of breath.   [name] atorvastatin (LIPITOR) 20 MG tablet TAKE 1/2 TABLET BY MOUTH DAILY (Patient taking differently:  Take 10 mg by mouth daily.) 12/25/2020: "Takes 10 mg every other day"  . dexamethasone (DECADRON) 4 MG tablet Take 2 tablets TWICE a day the day before, the day of, and the day after chemotherapy.   . diazepam (VALIUM) 5 MG tablet TAKE 1 TABLET (5 MG TOTAL) BY MOUTH EVERY 12 HOURS AS NEEDED FOR ANXIETY OR MUSCLE SPASMS.   [name] docusate sodium (COLACE) 100 MG capsule Take 1 capsule (100 mg total) by mouth 2 (two) times daily.   [name] ELIQUIS 5 MG TABS tablet TAKE 1 TABLET BY MOUTH TWICE A DAY (Patient taking differently: Take 5 mg by mouth 2 (two) times daily.)   . fentaNYL (DURAGESIC) 100 MCG/HR Place 1 patch onto the skin every 3 (three) days.   . furosemide (LASIX) 20 MG tablet Take 2 tablets (40 mg total) by mouth daily for 7 days, THEN 1 tablet (20 mg total) daily. 12/17/2020: Patient reports he is taking 2 tablets per day  . lidocaine-prilocaine (EMLA) cream Apply 1 application topically as needed. Apply 1 TBSP over port site  1-2 hours prior to treatment. Do not rub it in. Cover with plastic wrap.   . loratadine (CLARITIN) 10 MG tablet Take 10 mg by mouth daily.   . magic mouthwash SOLN Take 5 mLs by mouth 4 (four) times daily. 12/02/2020: Patient states he uses as needed.   . metFORMIN (GLUCOPHAGE) 500 MG tablet Take 1 tablet (500 mg total) by mouth daily with breakfast.   . methocarbamol (ROBAXIN) 500 MG tablet Take 1 tablet (500 mg total) by mouth every 6 (six) hours as needed for muscle spasms. 12/25/2020: Takes 1-2 /day  . Multiple Vitamin (MULTIVITAMIN) tablet Take 1 tablet  by mouth daily.   [name] OVER THE COUNTER MEDICATION 1 Dose daily as needed (1 pouch placed under upper lip x 20 mins). "ONNICOTINE" pouch   . oxyCODONE-acetaminophen (PERCOCET) 10-325 MG tablet 1 tablet EVERY 6 HOURS (route: oral) 01/15/2021: Med Classification: Analgesic, Anti-inflammatory or Antipyretic  . senna-docusate (SENOKOT-S) 8.6-50 MG tablet Take 2 tablets by mouth 2 (two) times daily as needed for mild constipation.  12/02/2020: Patient states he takes 1 tablet 2 times per day  . sildenafil (REVATIO) 20 MG tablet Take 1-5 tablets (20-100 mg total) by mouth daily as needed (ED). (Patient taking differently: Take 40 mg by mouth daily as needed (ED).) 12/02/2020: Patient states he takes as needed   . Skin Protectants, Misc. (CRITIC-AID CLEAR) OINT ointment Per instructions DAILY (route: topical) 01/15/2021: Med Classification: Dermatological  . Tyson Dense 6 MG/0.6ML injection INJECT 0.6 ML (6 MG TOTAL) UNDER THE SKIN EVERY 21 DAYS    No facility-administered encounter medications on file as of 01/16/2021.    Patient Active Problem List   Diagnosis Date Noted  . Constipation due to pain medication 11/27/2020  . Protein-calorie malnutrition ([place]) 11/27/2020  . (HFpEF) heart failure with preserved ejection fraction ([place]) 11/27/2020  . DNR (do not resuscitate) 11/27/2020  . Pressure injury of skin 11/08/2020  . Dyspnea and respiratory abnormalities 11/07/2020  . Pleural effusion 11/06/2020  . Cancer related pain 10/08/2020  . Femoral fracture ([place]) 07/24/2020  . Impending pathologic fracture 07/22/2020  . Situational depression 02/22/2020  . UTI due to extended-spectrum beta lactamase (ESBL) producing Escherichia coli 02/10/2020  . Abnormal thyroid function test 02/10/2020  . Port-A-Cath in place 01/29/2020  . Chronic pulmonary embolism ([place]) 01/20/2020  . Stage IV squamous cell carcinoma of right lung ([place]) 12/24/2019  . Advanced directives, counseling/discussion 12/24/2019  . Encounter for antineoplastic chemotherapy 12/24/2019  . Encounter for antineoplastic immunotherapy 12/24/2019  . Rib pain on right side 12/12/2019  . Bony metastasis ([place]) 12/12/2019  . Fatty liver 12/05/2019  . Encounter for well adult exam with abnormal findings 11/25/2019  . GERD (gastroesophageal reflux disease) 11/25/2019  . Dyslipidemia associated with type 2 diabetes mellitus ([place]) 11/25/2019  . Ex-smoker 09/15/2019  . OSA  (obstructive sleep apnea) 09/15/2019  . Obesity, Class I, BMI 30-34.9 09/04/2019  . Erectile dysfunction 10/18/2014  . Type 2 diabetes mellitus with other specified complication ([place]) 70/35/0093    Conditions to be addressed/monitored: CHF and Lung Cancer  Care Plan : Cancer Treatment Phase (Adult)  Updates made by Dannielle Karvonen, RN since 01/16/2021 12:00 AM  Problem: Fatigue   Priority: High  Long-Range Goal: Patient will verbalize decrease symptoms of fatigue   Start Date: 12/02/2020  Expected End Date: 04/16/2021  This Visit's Progress: On track  Recent Progress: On track  Priority: High  Current Barriers:  . Chronic Disease Management support, education, and care coordination needs related to  Lung Cancer   Clinical Goal(s) related to  Lung Cancer :  Patient will:  . Work with the care management team to address educational, disease management, and care coordination needs  . Begin or continue self health monitoring activities as directed today:  Keep your follow up appointments with your oncologist,  monitor oxygen saturation, notify your doctor for new or increase in symptoms: Oxygen saturation lower than established parameter, Chest pain, and Shortness of breath . Call care management team with questions or concerns. Interventions related to  Lung cancer :  . Discussed current treatment plans and patient's adherence to plan as established  by provider:  Patient reports he will not be taking any additional treatments.  Patient states, " I only have 2 1/2 months to live." Patient reports he will be speaking to someone with Hospice today and his plans are to transition to the hospice program.  . Assessed patient understanding of disease state.  Patient reports he is experiencing fatigue. Advised patient to take frequent rest breaks. . Assessed patient's education and care coordination needs. . Encourage physical activity, as appropriate (walking is a good way to ease fatigue)  Patient  Goals/ Self Care Activities related to  Lung cancer :  . - do not eat or exercise right before bedtime . - get outdoors every day (weather permitting) Walking is great at helping decrease fatigue.  . - take a warm shower or bath before bed . - use meditation or relaxation techniques     Problem: Pain   Priority: High  Onset Date: 12/02/2020  Long-Range Goal: Patient will report satisfactory pain control at a level less than 3 to 4  on a scale of ) to 10   Start Date: 12/02/2020  Expected End Date: 04/16/2021  This Visit's Progress: On track  Recent Progress: On track  Priority: High  Current Barriers:  [name] Knowledge Deficits related to self-health management of acute or chronic pain: . Chronic Disease Management support and education needs related to chronic pain Clinical Goal(s):  . patient will verbalize understanding of plan for pain management . patient will telephonically meet with RN Care Manager to address pain concerns  . patient will attend all scheduled medical appointments  . patient will use pharmacological and nonpharmacological pain relief strategies as prescribed.  . patient will verbalize acceptable level of pain relief and ability to engage in desired activities:  Patient reports his pain level today is a 7 on a scale of 0 to 10.  He reports he is experiencing most of his pain in his neck and shoulder due to arthritis.  He states he is going to call his primary care provider for follow up on his neck and shoulder pain.  He reports he continues to use patches for pain management and control but states the patches are not helping his neck and shoulder pain.   Interventions:  . Collaboration with [name], MD regarding development and update of comprehensive plan of care as evidenced by provider attestation and co-signature . Pain assessment performed:  Patient reports pain level is a 5 on a scale of 1-10. Reports fentanyl patch is managing pain at this time.   . Discussed plans with patient for ongoing care management follow up and provided patient with direct contact information for care management team . Provided education to patient regarding pain management strategies:  Advised patient to call his primary care provider to discuss neck/shoulder arthritis pain management.  . Reviewed medications with patient and encouraged patient to take pain medications as prescribed: Patient reports he continues to wear his fentanyl patch and uses his oxycodone as needed for breakthrough pain management . Reviewed scheduled/upcoming provider appointments. . Discussed plans with patient for ongoing care management follow up and provided patient with direct contact information for care management team Patient Goals/Self Care Activities:  . Will attend all scheduled provider appointments . Continue to call pharmacy for medication refills 7 days prior to needed refill date . Call provider office to discuss breakthrough neck/ shoulder arthritis pain.  . - do something like read, listen to music or watch television to keep mind off  pain . - learn relaxation techniques . - Continue to exercise or plan activity when pain is best controlled . - prioritize tasks for the day . - track what makes the pain worse and what makes it better . - use pain medication as prescribed by your doctor.  Follow Up Plan: The patient has been provided with contact information for the care management team and has been advised to call with any health related questions or concerns.  The care management team will reach out to the patient again over the next 30 days.     Care Plan : Heart Failure (Adult)  Updates made by Dannielle Karvonen, RN since 01/16/2021 12:00 AM  Problem: Symptom Exacerbation (Heart Failure)   Priority: High  Onset Date: 12/02/2020  Long-Range Goal: Symptom Exacerbation Prevented or Minimized   Start Date: 12/02/2020  Expected End Date: 04/16/2021  This Visit's Progress: On  track  Recent Progress: On track  Priority: High  Current Barriers:  [name] Knowledge deficit related to basic heart failure pathophysiology and self care management:  Patient reports ongoing swelling in his feet/ ankles. Patient reports he has a cough with some shortness of breath. Patient reports he is scheduled to have a Thoracentesis on 01/19/2021.  Case Manager Clinical Goal(s):  . patient will verbalize understanding of Heart Failure Action Plan and when to call doctor . patient will take all Heart Failure medications as prescribed . patient will weigh daily and record (notifying MD of 3 lb weight gain over night or 5 lb in a week) Interventions:  . Collaboration with [name], MD regarding development and update of comprehensive plan of care as evidenced by provider attestation and co-signature . Inter-disciplinary care team collaboration (see longitudinal plan of care):  Patient states he is schedule to talk with someone from Hospice today. He reports his plan it to transition to the hospice program.  . Provided verbal and written education on low sodium diet . Reviewed Heart Failure Action Plan in depth . Discussed importance of daily weight and advised patient to weigh and record daily:  Patient reports he continues ongoing home management of heart failure.  . Reviewed role of diuretics in prevention of fluid overload and management of heart failure Patient Goals/Self-Care Activities Continue to: - eat more whole grains, fruits and vegetables, lean meats and healthy fats - follow rescue plan if symptoms flare-up, Call your doctor for worsening symptoms.  - track symptoms and what helps feel better or worse: Elevate your feet when sitting to help reduce swelling.  - dress right for the weather, hot or cold  - Review Heart Failure action plan/ zones sent to you in My Chart - Follow up with your providers as recommended - Take your medications as prescribed.  - Monitor for signs/  symptoms of heart failure:  Weight gain of 3 pounds overnight or 5 lbs in a week, swelling in feet, ankles, stomach or hands, chest discomfort or heaviness, more tired/ less energy, dry hacky cough, new or worsening dizziness, uneasy feeling that something is not right. Notify your doctor of symptoms.   Follow Up Plan: The patient has been provided with contact information for the care management team and has been advised to call with any health related questions or concerns.  The care management team will reach out to the patient again over the next 30 days.      Plan: The patient has been provided with contact information for the care management team and has been  advised to call with any health related questions or concerns.  and The care management team will reach out to the patient again over the next 30 days.  Quinn Plowman RN,BSN,CCM RN Case Manager [place]  [phone]

## 2021-01-16 NOTE — Telephone Encounter (Signed)
Filled and back in Lisa's box.

## 2021-01-17 LAB — SARS CORONAVIRUS 2 (TAT 6-24 HRS): SARS Coronavirus 2: NEGATIVE

## 2021-01-19 ENCOUNTER — Other Ambulatory Visit: Payer: Self-pay

## 2021-01-19 ENCOUNTER — Telehealth: Payer: Self-pay

## 2021-01-19 ENCOUNTER — Ambulatory Visit (HOSPITAL_COMMUNITY)
Admission: RE | Admit: 2021-01-19 | Discharge: 2021-01-19 | Disposition: A | Discharge status: Home / Self Care | Payer: 59 | Source: Ambulatory Visit | Attending: Physician Assistant | Admitting: Physician Assistant

## 2021-01-19 ENCOUNTER — Ambulatory Visit: Payer: 59 | Admitting: Internal Medicine

## 2021-01-19 ENCOUNTER — Telehealth: Payer: Self-pay | Admitting: Adult Health Nurse Practitioner

## 2021-01-19 ENCOUNTER — Telehealth: Payer: Self-pay | Admitting: Medical Oncology

## 2021-01-19 ENCOUNTER — Ambulatory Visit (HOSPITAL_COMMUNITY)
Admission: RE | Admit: 2021-01-19 | Discharge: 2021-01-19 | Disposition: A | Discharge status: Home / Self Care | Payer: 59 | Source: Ambulatory Visit | Attending: Radiology | Admitting: Radiology

## 2021-01-19 DIAGNOSIS — J90 Pleural effusion, not elsewhere classified: Secondary | ICD-10-CM | POA: Insufficient documentation

## 2021-01-19 MED ORDER — LIDOCAINE HCL 1 % IJ SOLN
INTRAMUSCULAR | Status: AC
  Filled 2021-01-19: qty 20

## 2021-01-19 NOTE — Telephone Encounter (Signed)
Called patient and spoke with his Dalene Seltzer, to confirm Palliative f/u visit scheduled for 01/20/21, and she stated that patient was admitted to [facility] on 01/17/21.  Told Lattie Haw that I would need to discharge patient from Palliative services since Hospice was involved and she was in agreement with this.  Notified Palliative NP and Admin Team.

## 2021-01-19 NOTE — Telephone Encounter (Signed)
Spoke with patient who stated that his wife just got off the phone with our office and rescheduled our appointment for tomorrow. Huey Scalia K. Olena Heckle NP

## 2021-01-19 NOTE — Telephone Encounter (Signed)
Pain med -Mike [name] is asking for refill and for increase in Mike [name] 5 mg q 6 prn". Mike [name] is taking 2 tabs ( 10 mg) at a time.  Mike [name] prescribed it last month."'   ( I do not see this in his record.)   Per Mike [name] the patient is under [facility] -.  I instructed her to contact [facility] for [name].

## 2021-01-19 NOTE — Procedures (Signed)
PROCEDURE SUMMARY:  Successful US guided right thoracentesis. Yielded 1.7 L of clear yellow fluid. Pt tolerated procedure well. No immediate complications.  Specimen was not sent for labs. CXR ordered.  EBL < 5 mL  Ascencion Dike PA-C 01/19/2021 9:40 AM

## 2021-01-20 ENCOUNTER — Other Ambulatory Visit: Payer: 59 | Admitting: Adult Health Nurse Practitioner

## 2021-01-20 ENCOUNTER — Ambulatory Visit: Payer: Self-pay

## 2021-01-20 ENCOUNTER — Encounter: Payer: Self-pay | Admitting: Orthopaedic Surgery

## 2021-01-20 ENCOUNTER — Ambulatory Visit (INDEPENDENT_AMBULATORY_CARE_PROVIDER_SITE_OTHER): Payer: 59 | Admitting: Orthopaedic Surgery

## 2021-01-20 ENCOUNTER — Telehealth: Payer: Self-pay | Admitting: Family Medicine

## 2021-01-20 VITALS — Ht 72.0 in | Wt 220.0 lb

## 2021-01-20 DIAGNOSIS — G8929 Other chronic pain: Secondary | ICD-10-CM | POA: Diagnosis not present

## 2021-01-20 DIAGNOSIS — M25512 Pain in left shoulder: Secondary | ICD-10-CM

## 2021-01-20 MED ORDER — METHYLPREDNISOLONE ACETATE 40 MG/ML IJ SUSP
40.0000 mg | INTRAMUSCULAR | Status: AC | PRN
  Administered 2021-01-20: 40 mg via INTRA_ARTICULAR

## 2021-01-20 MED ORDER — BUPIVACAINE HCL 0.25 % IJ SOLN
2.0000 mL | INTRAMUSCULAR | Status: AC | PRN
  Administered 2021-01-20: 2 mL via INTRA_ARTICULAR

## 2021-01-20 MED ORDER — LIDOCAINE HCL 2 % IJ SOLN
2.0000 mL | INTRAMUSCULAR | Status: AC | PRN
  Administered 2021-01-20: 2 mL

## 2021-01-20 NOTE — Telephone Encounter (Signed)
Patient accepted [facility] care as of April 2nd,2022. EM

## 2021-01-20 NOTE — Telephone Encounter (Signed)
Noted. Thanks.

## 2021-01-20 NOTE — Progress Notes (Signed)
Office Visit Note   Patient: Mike [name]           Date of Birth: [birth year]           MRN: [id number] Visit Date: 01/20/2021              Requested by: [name], MD [address] [place],  [place] [address] PCP: [name], MD   Assessment & Plan: Visit Diagnoses:  1. Chronic left shoulder pain     Plan: Impression is left shoulder rotator cuff tendinosis.  We have discussed repeat subacromial cortisone injection today for which she would like to proceed.  If he does not have significant relief, we may refer him to Dr. Ernestina Patches for epidural steroid injection.  Follow-up with [place] as needed.  Follow-Up Instructions: Return if symptoms worsen or fail to improve.   Orders:  Orders Placed This Encounter  Procedures  . Large Joint Inj: L subacromial bursa  . XR Shoulder Left   No orders of the defined types were placed in this encounter.     Procedures: Large Joint Inj: L subacromial bursa on 01/20/2021 3:40 PM Indications: pain Details: 22 G needle Medications: 2 mL lidocaine 2 %; 2 mL bupivacaine 0.25 %; 40 mg methylPREDNISolone acetate 40 MG/ML Outcome: tolerated well, no immediate complications Patient was prepped and draped in the usual sterile fashion.       Clinical Data: No additional findings.   Subjective: Chief Complaint  Patient presents with  . Left Shoulder - Pain    HPI patient is a very pleasant 55 year old gentleman who comes in today with chronic left shoulder pain.  He has been doing with this for the past 6 to 8 years without any specific injury.  The pain has progressively worsened.  It primarily is to the top of the shoulder but does radiate into left side of the neck.  His symptoms are aggravated with lifting his shoulder in any direction or when he is pushing or pulling any amount of weight.  He denies any paresthesias to the left upper extremity.  He does note that he has started to develop some weakness over time.  He has  had cortisone injections to the left shoulder in the past which have seemed to help for several years.  He was seen in our office several months back with cervical spine radiculopathy.  He was started on a steroid taper muscle relaxer without significant relief.  Review of Systems as detailed in HPI.  All others reviewed and are negative.   Objective: Vital Signs: Ht 6' (1.829 m)   Wt 220 lb (99.8 kg)   BMI 29.84 kg/m   Physical Exam well nourished gentleman in no acute distress.  Alert and oriented x3.  Ortho Exam left shoulder exam reveals very limited range of motion secondary to pain.  3 out of 5 strength throughout.  He is neurovascularly intact distally.  Specialty Comments:  No specialty comments available.  Imaging: XR Shoulder Left  Result Date: 01/20/2021 Slight glenohumeral joint space narrowing.  Moderate AC degenerative changes.  He has slight superior migration of the humeral head.    PMFS History: Patient Active Problem List   Diagnosis Date Noted  . Constipation due to pain medication 11/27/2020  . Protein-calorie malnutrition ([place]) 11/27/2020  . (HFpEF) heart failure with preserved ejection fraction ([place]) 11/27/2020  . DNR (do not resuscitate) 11/27/2020  . Pressure injury of skin 11/08/2020  . Dyspnea and respiratory abnormalities 11/07/2020  .  Pleural effusion 11/06/2020  . Cancer related pain 10/08/2020  . Femoral fracture ([place]) 07/24/2020  . Impending pathologic fracture 07/22/2020  . Situational depression 02/22/2020  . UTI due to extended-spectrum beta lactamase (ESBL) producing Escherichia coli 02/10/2020  . Abnormal thyroid function test 02/10/2020  . Port-A-Cath in place 01/29/2020  . Chronic pulmonary embolism ([place]) 01/20/2020  . Stage IV squamous cell carcinoma of right lung ([place]) 12/24/2019  . Advanced directives, counseling/discussion 12/24/2019  . Encounter for antineoplastic chemotherapy 12/24/2019  . Encounter for antineoplastic  immunotherapy 12/24/2019  . Rib pain on right side 12/12/2019  . Bony metastasis ([place]) 12/12/2019  . Fatty liver 12/05/2019  . Encounter for well adult exam with abnormal findings 11/25/2019  . GERD (gastroesophageal reflux disease) 11/25/2019  . Dyslipidemia associated with type 2 diabetes mellitus ([place]) 11/25/2019  . Ex-smoker 09/15/2019  . OSA (obstructive sleep apnea) 09/15/2019  . Obesity, Class I, BMI 30-34.9 09/04/2019  . Erectile dysfunction 10/18/2014  . Type 2 diabetes mellitus with other specified complication ([place]) 16/07/9603   Past Medical History:  Diagnosis Date  . Anemia   . Arthritis    neck  . Cancer, metastatic to bone ([place]) dx'd 10/2019  . Clotting disorder ([place])    previous blood clot  . Diabetes ([place])   . Fatty liver 12/05/2019   By [place] 11/2019  . GERD (gastroesophageal reflux disease)   . 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 High cholesterol   . IBS (irritable bowel syndrome)   . lung ca dx'd 10/2019   lung stage 4   . OSA (obstructive sleep apnea) 09/15/2019   Sleep study 12/2017 - severe OSA with AHI 65.6, desat to 76% rec CPAP  . Severe obesity (BMI 35.0-39.9) with comorbidity ([place]) 09/04/2019    Family History  Problem Relation Age of Onset  . CAD Mother        stents  . Stroke Neg Hx   . Diabetes Neg Hx   . Cancer Neg Hx   . Colon cancer Neg Hx   . Esophageal cancer Neg Hx   . Stomach cancer Neg Hx   . Rectal cancer Neg Hx     Past Surgical History:  Procedure Laterality Date  . BIOPSY  12/18/2019   Procedure: BIOPSY;  Surgeon: Garner Nash, DO;  Location: [facility];  Service: Pulmonary;;  . BRONCHIAL BRUSHINGS  12/18/2019   Procedure: BRONCHIAL BRUSHINGS;  Surgeon: Garner Nash, DO;  Location: [facility];  Service: Pulmonary;;  . BRONCHIAL WASHINGS  12/18/2019   Procedure: BRONCHIAL WASHINGS;  Surgeon: Garner Nash, DO;  Location: [facility];  Service: Pulmonary;;  . COLONOSCOPY  10/2019   mult TA, int hem, rpt 1 yr  ([place])  . CYSTECTOMY     knee- left knee cyst  . ENDOBRONCHIAL ULTRASOUND  12/18/2019   Procedure: ENDOBRONCHIAL ULTRASOUND;  Surgeon: Garner Nash, DO;  Location: [facility];  Service: Pulmonary;;  . FINE NEEDLE ASPIRATION  12/18/2019   Procedure: FINE NEEDLE ASPIRATION;  Surgeon: Garner Nash, DO;  Location: [facility];  Service: Pulmonary;;  . FRACTURE SURGERY     left femur fracture  . INTRAMEDULLARY (IM) NAIL INTERTROCHANTERIC Left 07/25/2020   Procedure: LEFT INTERTROCHANTERIC INTRAMEDULLARY (IM) NAIL;  Surgeon: Leandrew Koyanagi, MD;  Location: [facility];  Service: Orthopedics;  Laterality: Left;  . IR IMAGING GUIDED PORT INSERTION  12/27/2019  . PILONIDAL CYST EXCISION    . VIDEO BRONCHOSCOPY WITH ENDOBRONCHIAL ULTRASOUND Right 12/18/2019  Procedure: VIDEO BRONCHOSCOPY;  Surgeon: Garner Nash, DO;  Location: [facility];  Service: Pulmonary;  Laterality: Right;   Social History   Occupational History  . Occupation: Applying for Disability  Tobacco Use  . Smoking status: Former Smoker    Packs/day: 1.00    Years: 40.00    Pack years: 40.00    Quit date: 12/20/2019    Years since quitting: 1.0  . Smokeless tobacco: Never Used  . Tobacco comment: 'I quit"- On "onnicotine"pouches  Vaping Use  . Vaping Use: Never used  Substance and Sexual Activity  . Alcohol use: Yes    Comment: Rarely  . Drug use: Never  . Sexual activity: Yes    Partners: Female

## 2021-01-21 ENCOUNTER — Inpatient Hospital Stay: Payer: 59 | Admitting: Internal Medicine

## 2021-01-21 ENCOUNTER — Telehealth: Payer: Self-pay

## 2021-01-21 NOTE — Telephone Encounter (Signed)
Spoke with patient regarding concerns.  [facility] hosp[facility] care started yesterday.  Had thoracentesis of 2L from R lung yesterday.  Pain overall well controlled with fentanyl 157mcg patch as well as oxycodone PRN breakthrough pain.  Just had steroid injection into shoulder with benefit.  Known lung cancer with bony mets, chemo stopped working.

## 2021-01-21 NOTE — Telephone Encounter (Signed)
Patient has questions concerns his medical condition and disability. Would like doctor G to call him to discuss. Patient came into the office today, thought he was scheduled for an appt. Doesn't want to make an appt just needs to speak to the nurse/Dr. G by phone

## 2021-01-22 ENCOUNTER — Inpatient Hospital Stay: Payer: 59

## 2021-01-22 ENCOUNTER — Other Ambulatory Visit: Payer: Self-pay | Admitting: Family Medicine

## 2021-01-22 NOTE — Telephone Encounter (Signed)
Eliquis Last filled:  12/22/20, #60 Last OV:  11/26/20, hosp f/u Next OV:  05/27/21, 6 mo f/u

## 2021-01-25 ENCOUNTER — Other Ambulatory Visit: Payer: Self-pay | Admitting: *Deleted

## 2021-01-25 MED ORDER — ALBUTEROL SULFATE (2.5 MG/3ML) 0.083% IN NEBU: 2.5000 mg | INHALATION_SOLUTION | Freq: Four times a day (QID) | RESPIRATORY_TRACT | 1 refills | Status: AC | PRN

## 2021-01-29 ENCOUNTER — Inpatient Hospital Stay: Payer: 59

## 2021-01-30 ENCOUNTER — Telehealth: Payer: 59

## 2021-02-02 ENCOUNTER — Telehealth: Payer: Self-pay | Admitting: *Deleted

## 2021-02-02 ENCOUNTER — Telehealth: Payer: 59

## 2021-02-02 ENCOUNTER — Telehealth: Payer: Self-pay

## 2021-02-02 NOTE — Telephone Encounter (Signed)
  Chronic Care Management   Outreach Note  02/02/2021 Name: KALAB CAMPS MRN: [id number] DOB: 1966/03/09  Referred by: [name], MD Reason for referral : Care Coordination (HF, Lung Ca)   An unsuccessful telephone outreach was attempted today. The patient was referred to the case management team for assistance with care management and care coordination.   Follow Up Plan: A HIPAA compliant phone message was left for the patient providing contact information and requesting a return call.   Quinn Plowman RN,BSN,CCM RN Case Manager [place]  [phone]

## 2021-02-02 NOTE — Telephone Encounter (Signed)
Noted! Thank you

## 2021-02-02 NOTE — Telephone Encounter (Signed)
Daughter Claiborne Billings called because pt had a miss call from [place] on his phone. It was [facility] phone call. Claiborne Billings advised me that the phone calls from them will no longer be necessary. Pt is in full hospice and isn't doing well. He isn't eating anymore and his kidney's are shutting down. They have stopped all meds except comfort meds. F/u phone call appt with [facility] and also f/u with PCP that was scheduled in Aug both cancelled. Daughter thanked PCP and office for all we have done for pt. She said hospice is coming out daily and have advised family it will be any day now that he passes.   FYI to PCP and Quinn Plowman, RN who called earlier

## 2021-02-02 NOTE — Progress Notes (Signed)
This encounter was created in error - please disregard.

## 2021-02-04 ENCOUNTER — Telehealth: Payer: Self-pay

## 2021-02-04 NOTE — Telephone Encounter (Cosign Needed)
  Care Management   Outreach Note  02/04/2021 Name: DARIEN KADING MRN: [id number] DOB: [birth year]  Referred by: [name], MD Reason for referral : Care Coordination (HF, Lung Ca)  Notification received from [facility]  that patient is in Hospice program. Also noted that daughter of patient called and requested no further calls to patient.   Follow Up Plan: Patient closed to care coordination services. No further follow up needed. Primary care provider notified.   Quinn Plowman RN,BSN,CCM RN Case Manager [place]  [phone]

## 2021-02-05 ENCOUNTER — Other Ambulatory Visit: Payer: 59

## 2021-02-05 ENCOUNTER — Ambulatory Visit: Payer: 59

## 2021-02-05 ENCOUNTER — Ambulatory Visit: Payer: 59 | Admitting: Physician Assistant

## 2021-02-07 ENCOUNTER — Other Ambulatory Visit: Payer: Self-pay | Admitting: Family Medicine

## 2021-02-12 ENCOUNTER — Other Ambulatory Visit: Payer: 59

## 2021-02-17 NOTE — Chronic Care Management (AMB) (Deleted)
Care Management    RN Visit Note  02/17/2021 Name: MACAULEY [name] MRN: [id number] DOB: 1966-03-05  Subjective: Mike [name] is a 55 y.o. year old male who is a primary care patient of Mike Bush, MD. The care management team was consulted for assistance with disease management and care coordination needs.    {CCMTELEPHONEFACETOFACE:21091510} for {CCMINITIALFOLLOWUPCHOICE:21091511} in response to provider referral for case management and/or care coordination services.   Consent to Services:   Mr. Mike [name] was given information about Care Management services today including:  1. Care Management services includes personalized support from designated clinical staff supervised by his physician, including individualized plan of care and coordination with other care providers 2. 24/7 contact phone numbers for assistance for urgent and routine care needs. 3. The patient may stop case management services at any time by phone call to the office staff.  Patient agreed to services and consent obtained.   Assessment: Review of patient past medical history, allergies, medications, health status, including review of consultants reports, laboratory and other test data, was performed as part of comprehensive evaluation and provision of chronic care management services.   SDOH (Social Determinants of Health) assessments and interventions performed:    Care Plan  No Known Allergies  Outpatient Encounter Medications as of 02/17/2021  Medication Sig Note  . albuterol (PROVENTIL) (2.5 MG/3ML) 0.083% nebulizer solution Take 3 mLs (2.5 mg total) by nebulization every 6 (six) hours as needed for wheezing or shortness of breath.   [name] atorvastatin (LIPITOR) 20 MG tablet TAKE 1/2 TABLET BY MOUTH EVERY DAY   . dexamethasone (DECADRON) 4 MG tablet Take 2 tablets TWICE a day the day before, the day of, and the day after chemotherapy.   . diazepam (VALIUM) 5 MG tablet Take 2 tablets (10 mg total) by mouth 2  (two) times daily as needed for anxiety.   . docusate sodium (COLACE) 100 MG capsule Take 1 capsule (100 mg total) by mouth 2 (two) times daily.   [name] ELIQUIS 5 MG TABS tablet TAKE 1 TABLET BY MOUTH TWICE A DAY   . fentaNYL (DURAGESIC) 100 MCG/HR Place 1 patch onto the skin every 3 (three) days.   . furosemide (LASIX) 20 MG tablet Take 2 tablets (40 mg total) by mouth daily for 7 days, THEN 1 tablet (20 mg total) daily. 12/17/2020: Patient reports he is taking 2 tablets per day  . lidocaine-prilocaine (EMLA) cream Apply 1 application topically as needed. Apply 1 TBSP over port site  1-2 hours prior to treatment. Do not rub it in. Cover with plastic wrap.   . loratadine (CLARITIN) 10 MG tablet Take 10 mg by mouth daily.   . magic mouthwash SOLN Take 5 mLs by mouth 4 (four) times daily. 12/02/2020: Patient states he uses as needed.   . metFORMIN (GLUCOPHAGE) 500 MG tablet Take 1 tablet (500 mg total) by mouth daily with breakfast.   . methocarbamol (ROBAXIN) 500 MG tablet Take 1 tablet (500 mg total) by mouth every 6 (six) hours as needed for muscle spasms. 12/25/2020: Takes 1-2 /day  . Multiple Vitamin (MULTIVITAMIN) tablet Take 1 tablet by mouth daily.   [name] OVER THE COUNTER MEDICATION 1 Dose daily as needed (1 pouch placed under upper lip x 20 mins). "ONNICOTINE" pouch   . oxyCODONE-acetaminophen (PERCOCET) 10-325 MG tablet 1 tablet EVERY 6 HOURS (route: oral) 01/15/2021: Med Classification: Analgesic, Anti-inflammatory or Antipyretic  . senna-docusate (SENOKOT-S) 8.6-50 MG tablet Take 2 tablets by mouth 2 (two) times daily  as needed for mild constipation. 12/02/2020: Patient states he takes 1 tablet 2 times per day  . sildenafil (REVATIO) 20 MG tablet Take 1-5 tablets (20-100 mg total) by mouth daily as needed (ED). (Patient taking differently: Take 40 mg by mouth daily as needed (ED).) 12/02/2020: Patient states he takes as needed   . Skin Protectants, Misc. (CRITIC-AID CLEAR) OINT ointment Per instructions  DAILY (route: topical) 01/15/2021: Med Classification: Dermatological  . Tyson Dense 6 MG/0.6ML injection INJECT 0.6 ML (6 MG TOTAL) UNDER THE SKIN EVERY 21 DAYS    No facility-administered encounter medications on file as of 02/17/2021.    Patient Active Problem List   Diagnosis Date Noted  . Constipation due to pain medication 11/27/2020  . Protein-calorie malnutrition ([place]) 11/27/2020  . (HFpEF) heart failure with preserved ejection fraction (Mike [name]) 11/27/2020  . DNR (do not resuscitate) 11/27/2020  . Pressure injury of skin 11/08/2020  . Dyspnea and respiratory abnormalities 11/07/2020  . Pleural effusion 11/06/2020  . Cancer related pain 10/08/2020  . Femoral fracture ([place]) 07/24/2020  . Impending pathologic fracture 07/22/2020  . Situational depression 02/22/2020  . UTI due to extended-spectrum beta lactamase (ESBL) producing Escherichia coli 02/10/2020  . Abnormal thyroid function test 02/10/2020  . Port-A-Cath in place 01/29/2020  . Chronic pulmonary embolism (Mike [name]) 01/20/2020  . Stage IV squamous cell carcinoma of right lung (Mike [name]) 12/24/2019  . Advanced directives, counseling/discussion 12/24/2019  . Encounter for antineoplastic chemotherapy 12/24/2019  . Encounter for antineoplastic immunotherapy 12/24/2019  . Rib pain on right side 12/12/2019  . Bony metastasis (Mike [name]) 12/12/2019  . Fatty liver 12/05/2019  . Encounter for well adult exam with abnormal findings 11/25/2019  . GERD (gastroesophageal reflux disease) 11/25/2019  . Dyslipidemia associated with type 2 diabetes mellitus (Mike [name]) 11/25/2019  . Ex-smoker 09/15/2019  . OSA (obstructive sleep apnea) 09/15/2019  . Obesity, Class I, BMI 30-34.9 09/04/2019  . Erectile dysfunction 10/18/2014  . Type 2 diabetes mellitus with other specified complication (Mike [name]) 58/52/7782    Conditions to be addressed/monitored: {CCM ASSESSMENT DZ OPTIONS:25047}  Care Plan : Heart Failure (Adult)  Updates made by Dannielle Karvonen, RN since  02/17/2021 12:00 AM    Problem: Symptom Exacerbation (Heart Failure)   Priority: High  Onset Date: 12/02/2020    Long-Range Goal: Symptom Exacerbation Prevented or Minimized Completed 02/04/2021  Start Date: 12/02/2020  Expected End Date: 04/16/2021  Recent Progress: On track  Priority: High  Note:   Goal completed due to patient  being managed by another care management program.    Current Barriers:  [name] Knowledge deficit related to basic heart failure pathophysiology and self care management:  Patient reports ongoing swelling in his feet/ ankles. Patient reports he has a cough with some shortness of breath. Patient reports he is scheduled to have a Thoracentesis on 01/19/2021.  Case Manager Clinical Goal(s):  . patient will verbalize understanding of Heart Failure Action Plan and when to call doctor . patient will take all Heart Failure medications as prescribed . patient will weigh daily and record (notifying MD of 3 lb weight gain over night or 5 lb in a week) Interventions:  . Collaboration with Mike Bush, MD regarding development and update of comprehensive plan of care as evidenced by provider attestation and co-signature . Inter-disciplinary care team collaboration (see longitudinal plan of care):  Patient states he is schedule to talk with someone from Hospice today. He reports his plan it to transition to the hospice program.  . Provided verbal and written education  on low sodium diet . Reviewed Heart Failure Action Plan in depth . Discussed importance of daily weight and advised patient to weigh and record daily:  Patient reports he continues ongoing home management of heart failure.  . Reviewed role of diuretics in prevention of fluid overload and management of heart failure Patient Goals/Self-Care Activities Continue to: - eat more whole grains, fruits and vegetables, lean meats and healthy fats - follow rescue plan if symptoms flare-up, Call your doctor for worsening  symptoms.  - track symptoms and what helps feel better or worse: Elevate your feet when sitting to help reduce swelling.  - dress right for the weather, hot or cold  - Review Heart Failure action plan/ zones sent to you in My Chart - Follow up with your providers as recommended - Take your medications as prescribed.  - Monitor for signs/ symptoms of heart failure:  Weight gain of 3 pounds overnight or 5 lbs in a week, swelling in feet, ankles, stomach or hands, chest discomfort or heaviness, more tired/ less energy, dry hacky cough, new or worsening dizziness, uneasy feeling that something is not right. Notify your doctor of symptoms.   Follow Up Plan: The patient has been provided with contact information for the care management team and has been advised to call with any health related questions or concerns.  The care management team will reach out to the patient again over the next 30 days.           Plan: {CM FOLLOW UP PLAN:25073}  SIG***

## 2021-02-18 NOTE — Progress Notes (Signed)
This encounter was created in error - please disregard.

## 2021-02-19 ENCOUNTER — Other Ambulatory Visit: Payer: 59

## 2021-02-26 ENCOUNTER — Other Ambulatory Visit: Payer: 59

## 2021-02-26 ENCOUNTER — Ambulatory Visit: Payer: 59

## 2021-02-26 ENCOUNTER — Ambulatory Visit: Payer: 59 | Admitting: Internal Medicine

## 2021-03-18 DEATH — deceased

## 2021-03-30 ENCOUNTER — Telehealth: Payer: Self-pay | Admitting: Family Medicine

## 2021-03-30 NOTE — Telephone Encounter (Signed)
Late entry - spoke with pt's significant other at her visit 5/24 who informed me [name] passed away 03/10/2021.

## 2021-05-27 ENCOUNTER — Ambulatory Visit: Payer: 59 | Admitting: Family Medicine

## 2021-07-19 IMAGING — MR MR HEAD WO/W CM
15 series · 48 of 48 positions shown · IV contrast (10ml Gadavist)
Comparison: None.

CLINICAL DATA: Non-small-cell lung cancer.  Staging.

EXAM:
MRI HEAD WITHOUT AND WITH CONTRAST
TECHNIQUE: Multiplanar, multiecho pulse sequences of the brain and surrounding
structures were obtained without and with intravenous contrast.
CONTRAST:  10mL GADAVIST GADOBUTROL 1 MMOL/ML IV SOLN

[Series 5: ax dwi_tracew · axial · 3.0mm · 0.60mm/px · z∈[-52,+85]mm · 3 of 48 slices shown]
[im 1/48]
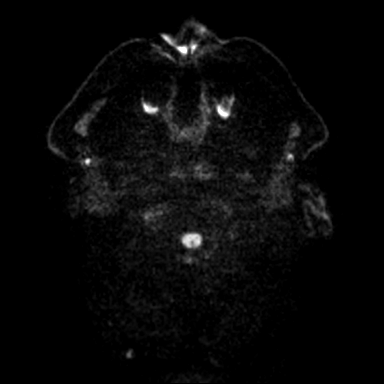
[im 24/48]
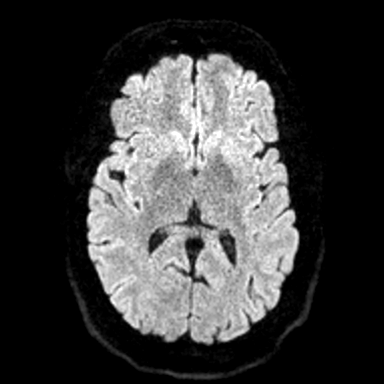
[im 48/48]
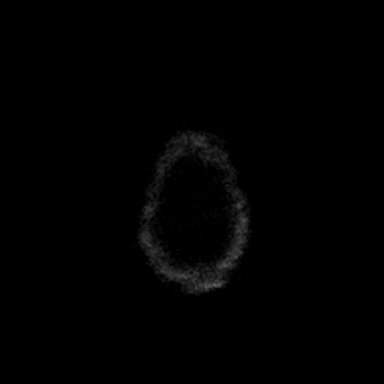

[Series 6: ax dwi_adc · axial · 3.0mm · 0.60mm/px · z∈[-52,+85]mm · 3 of 48 slices shown]
[im 1/48]
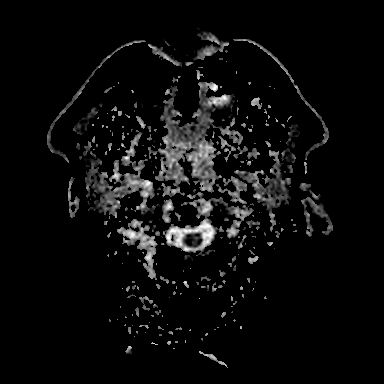
[im 24/48]
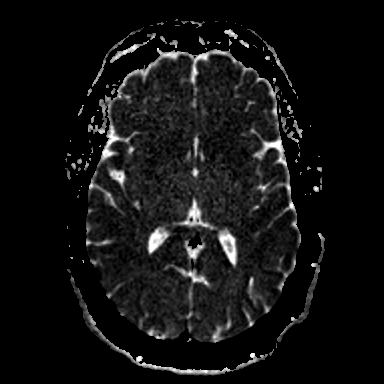
[im 48/48]
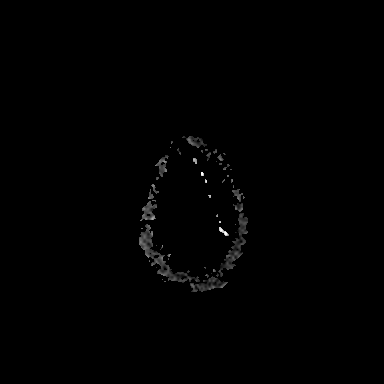

[Series 7: cor dwi_tracew · coronal · 5.0mm · 0.60mm/px · 2 of 42 slices shown]
[im 1/42]
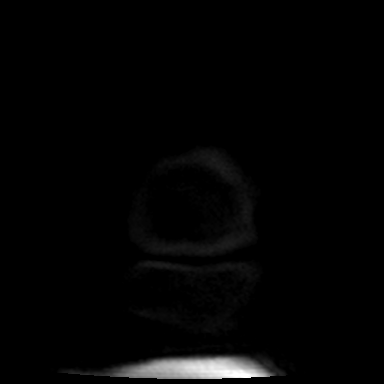
[im 42/42]
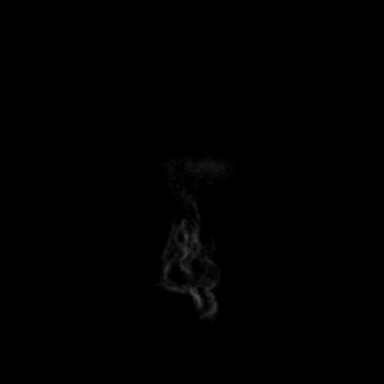

[Series 8: cor dwi_adc · coronal · 5.0mm · 0.60mm/px · 2 of 42 slices shown]
[im 1/42]
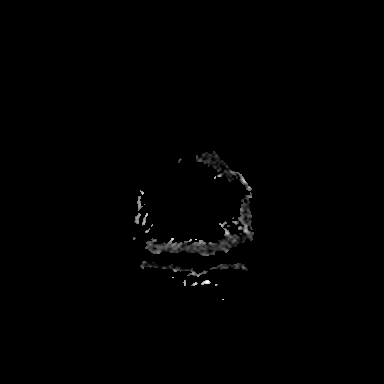
[im 42/42]
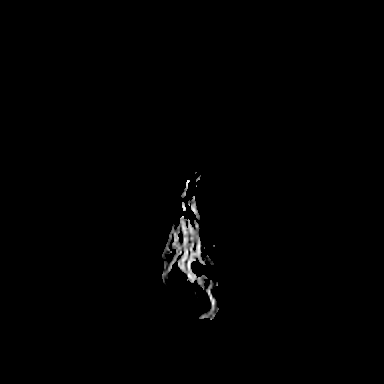

[Series 9: T1 · sagittal · 5.0mm · 0.62mm/px · 1 of 23 slices shown (1 of 2)]
[im 1/23]
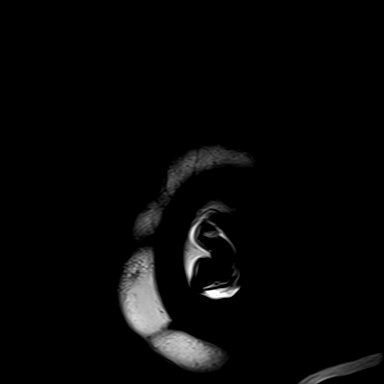

[Series 10: T2 · axial · 3.0mm · 0.53mm/px · z∈[-54,+83]mm · 2 of 48 slices shown]
[im 1/48]
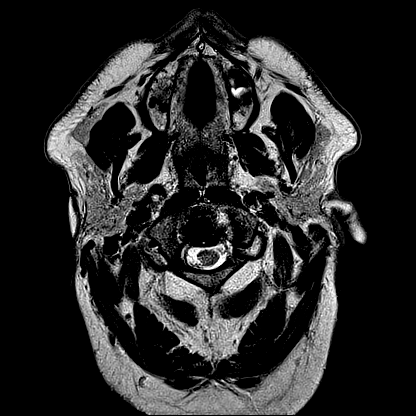
[im 48/48]
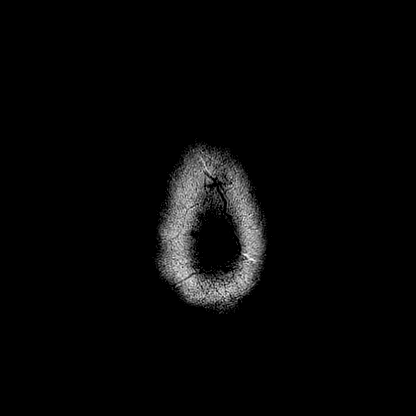

[Series 11: mag_images · axial · 3.0mm · 0.90mm/px · z∈[-61,+95]mm · 3 of 60 slices shown]
[im 1/60]
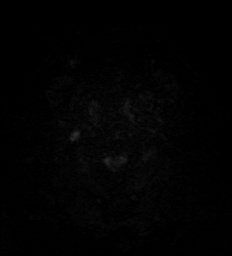
[im 30/60]
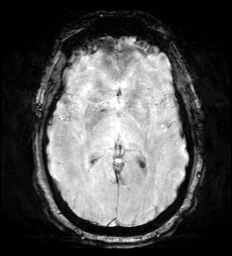
[im 60/60]
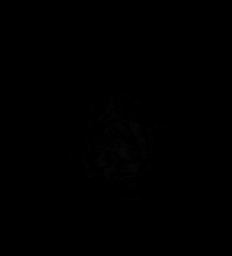

[Series 12: pha_images · axial · 3.0mm · 0.90mm/px · z∈[-61,+95]mm · 3 of 59 slices shown]
[im 1/59]
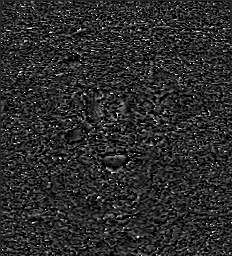
[im 30/59]
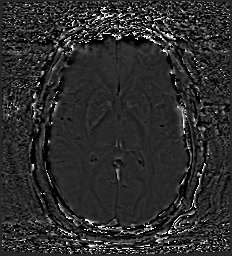
[im 59/59]
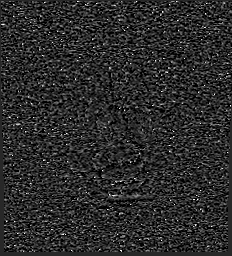

[Series 13: swi_images · axial · 3.0mm · 0.90mm/px · z∈[-61,+95]mm · 3 of 60 slices shown]
[im 1/60]
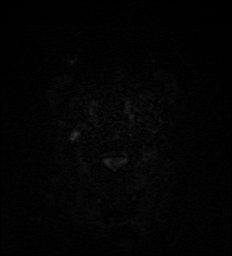
[im 30/60]
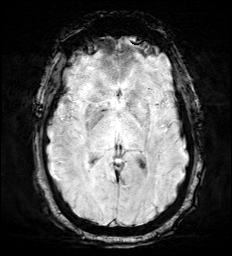
[im 60/60]
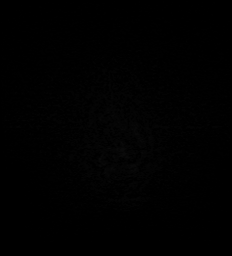

[Series 15: FLAIR · axial · 3.0mm · 0.53mm/px · z∈[-57,+86]mm · 3 of 55 slices shown]
[im 1/55]
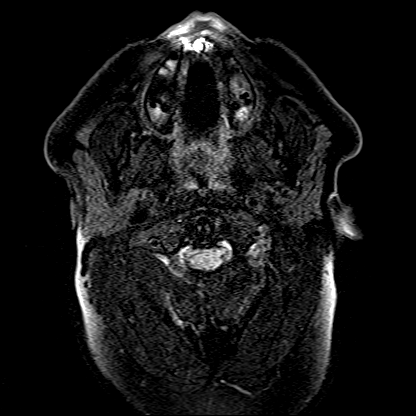
[im 28/55]
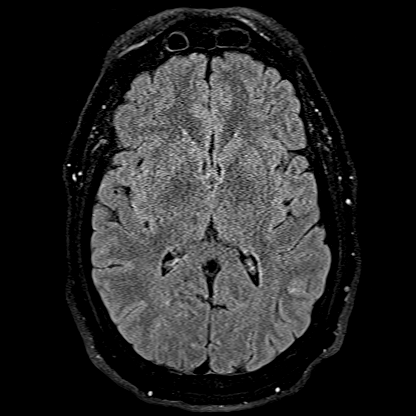
[im 55/55]
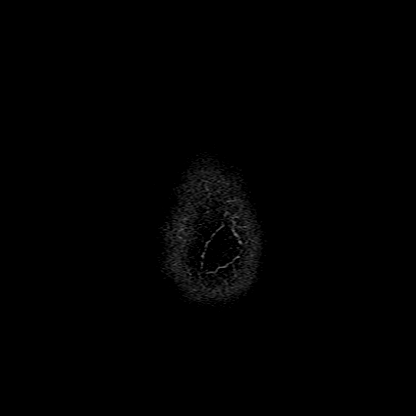

[Series 16: T1 · axial · 1.0mm · 0.98mm/px · z∈[-55,+98]mm · 9 of 176 slices shown (2 of 2)]
[im 1/176]
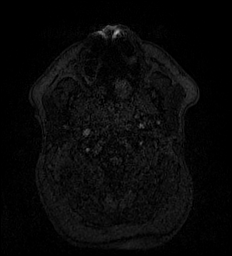
[im 22/176]
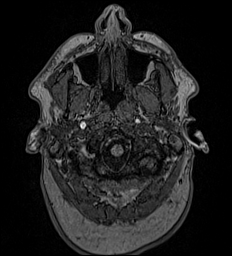
[im 44/176]
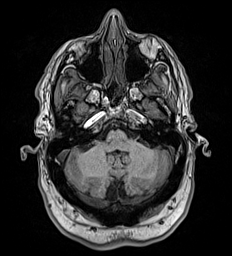
[im 66/176]
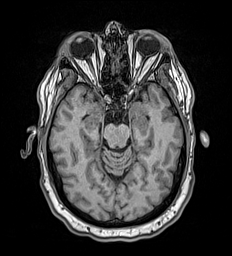
[im 88/176]
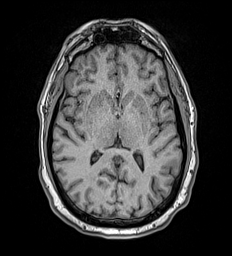
[im 110/176]
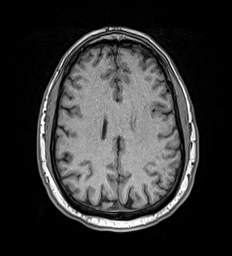
[im 132/176]
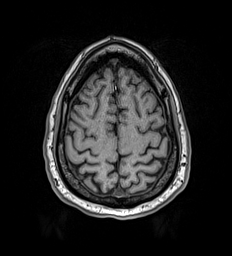
[im 154/176]
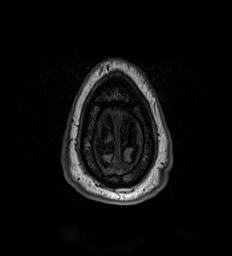
[im 176/176]
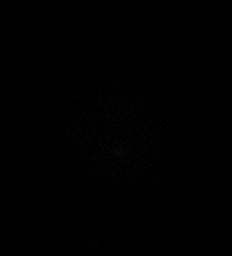

[Series 17: T2 post-contrast · coronal · 5.0mm · 0.57mm/px · 2 of 42 slices shown]
[im 1/42]
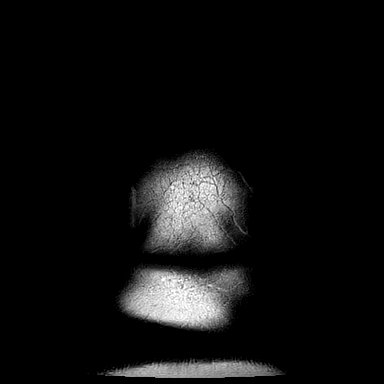
[im 42/42]
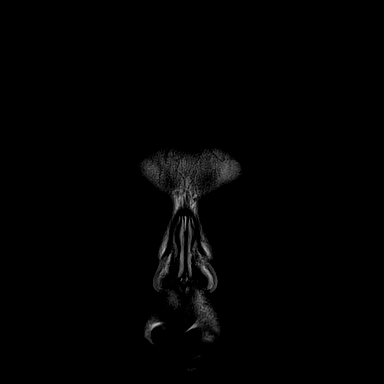

[Series 18: T1 post-contrast · axial · 1.0mm · 0.98mm/px · z∈[-55,+98]mm · 9 of 176 slices shown (1 of 3)]
[im 1/176]
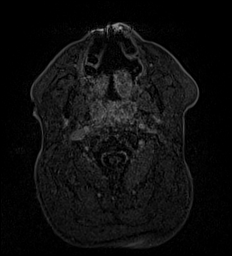
[im 22/176]
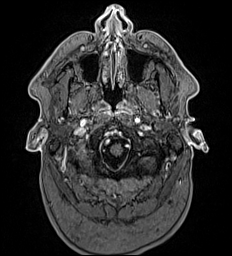
[im 44/176]
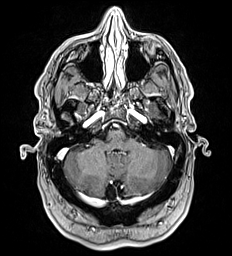
[im 66/176]
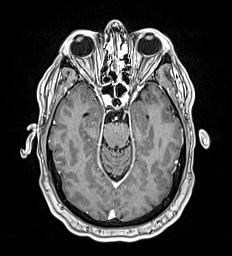
[im 88/176]
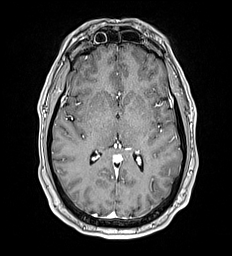
[im 110/176]
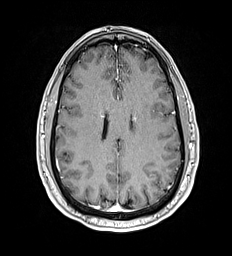
[im 132/176]
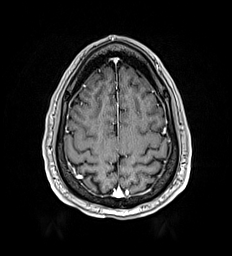
[im 154/176]
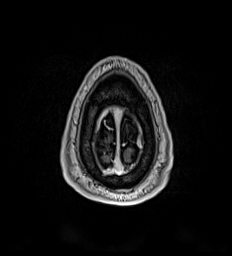
[im 176/176]
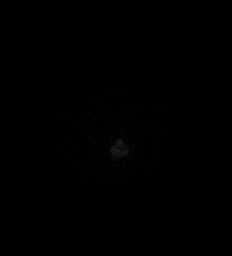

[Series 19: T1 post-contrast · coronal · 5.0mm · 0.57mm/px · 2 of 42 slices shown (2 of 3)]
[im 1/42]
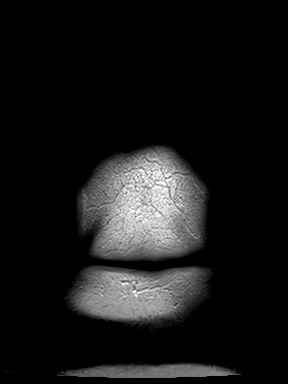
[im 42/42]
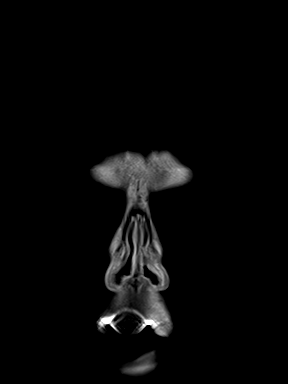

[Series 20: T1 post-contrast · sagittal · 5.0mm · 0.62mm/px · 1 of 23 slices shown (3 of 3)]
[im 1/23]
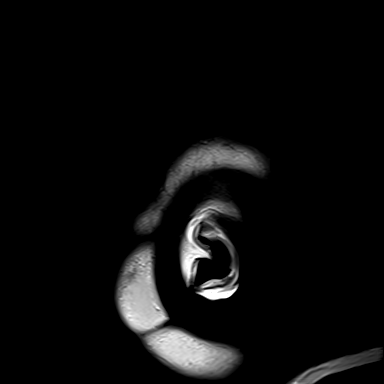

[48 of 48 positions shown; findings below may reference images not displayed]

FINDINGS: Brain: Ventricle size and cerebral volume normal. Scattered small
white matter hyperintensities bilaterally consistent with chronic
microvascular ischemia. Negative for acute infarct, hemorrhage, mass

Normal enhancement following contrast administration. No metastatic
deposits.

Vascular: Normal arterial flow voids.

Skull and upper cervical spine: Negative

Sinuses/Orbits: Mild mucosal edema paranasal sinuses. Negative orbit

Other: None
IMPRESSION: Negative for metastatic disease to the brain

Mild chronic microvascular ischemic change in the white matter. No
acute abnormality
